# Patient Record
Sex: Female | Born: 1940 | Race: White | Hispanic: No | Smoking: Former smoker
Health system: Southern US, Community
[De-identification: ages and names within clinical notes are randomized; demographics above are authoritative.]

## PROBLEM LIST (undated history)

## (undated) DIAGNOSIS — K219 Gastro-esophageal reflux disease without esophagitis: Secondary | ICD-10-CM

## (undated) DIAGNOSIS — R011 Cardiac murmur, unspecified: Secondary | ICD-10-CM

## (undated) DIAGNOSIS — C9200 Acute myeloblastic leukemia, not having achieved remission: Secondary | ICD-10-CM

## (undated) DIAGNOSIS — M8580 Other specified disorders of bone density and structure, unspecified site: Secondary | ICD-10-CM

## (undated) DIAGNOSIS — B370 Candidal stomatitis: Secondary | ICD-10-CM

## (undated) HISTORY — DX: Cardiac murmur, unspecified: R01.1

## (undated) HISTORY — PX: BONE MARROW TRANSPLANT: SHX200

## (undated) HISTORY — PX: NECK SURGERY: SHX720

## (undated) HISTORY — PX: WISDOM TOOTH EXTRACTION: SHX21

## (undated) HISTORY — PX: BREAST LUMPECTOMY: SHX2

## (undated) HISTORY — DX: Acute myeloblastic leukemia, not having achieved remission: C92.00

## (undated) HISTORY — PX: EYE SURGERY: SHX253

## (undated) HISTORY — DX: Gastro-esophageal reflux disease without esophagitis: K21.9

## (undated) HISTORY — PX: CATARACT EXTRACTION: SUR2

## (undated) HISTORY — PX: MENISCUS REPAIR: SHX5179

## (undated) HISTORY — DX: Other specified disorders of bone density and structure, unspecified site: M85.80

## (undated) HISTORY — PX: HYSTEROTOMY: SHX1776

## (undated) HISTORY — PX: LYMPH GLAND EXCISION: SHX13

## (undated) HISTORY — PX: SALPINGECTOMY: SHX328

---

## 2017-10-31 LAB — LIPID PANEL
HDL: 43 (ref 35–70)
LDL Cholesterol: 47
Triglycerides: 231 — AB (ref 40–160)

## 2017-10-31 LAB — BASIC METABOLIC PANEL
BUN: 10 (ref 4–21)
Chloride: 108 (ref 99–108)
Creatinine: 0.7 (ref 0.5–1.1)
Glucose: 158
Potassium: 3.4 (ref 3.4–5.3)
Sodium: 143 (ref 137–147)

## 2017-10-31 LAB — COMPREHENSIVE METABOLIC PANEL
Albumin: 3.9 (ref 3.5–5.0)
Calcium: 9.1 (ref 8.7–10.7)

## 2017-10-31 LAB — HEPATIC FUNCTION PANEL
ALT: 13 (ref 7–35)
AST: 18 (ref 13–35)
Alkaline Phosphatase: 71 (ref 25–125)
Bilirubin, Total: 0.2

## 2018-05-20 LAB — HM COLONOSCOPY

## 2019-05-19 LAB — BASIC METABOLIC PANEL
CO2: 26 — AB (ref 13–22)
Chloride: 108 (ref 99–108)
Creatinine: 0.9 (ref 0.5–1.1)
Glucose: 100
Potassium: 4 (ref 3.4–5.3)
Sodium: 144 (ref 137–147)

## 2019-05-19 LAB — CBC AND DIFFERENTIAL
HCT: 38 (ref 36–46)
Hemoglobin: 12.2 (ref 12.0–16.0)
Platelets: 249 (ref 150–399)
WBC: 6.9

## 2019-05-19 LAB — COMPREHENSIVE METABOLIC PANEL
Albumin: 4.2 (ref 3.5–5.0)
Calcium: 9.7 (ref 8.7–10.7)
Globulin: 2.9

## 2019-05-19 LAB — HEPATIC FUNCTION PANEL
ALT: 9 (ref 7–35)
AST: 18 (ref 13–35)
Alkaline Phosphatase: 62 (ref 25–125)

## 2019-05-19 LAB — CBC: RBC: 5.07 (ref 3.87–5.11)

## 2019-05-21 LAB — IRON,TIBC AND FERRITIN PANEL
%SAT: 40
Ferritin: 106
Iron: 130
TIBC: 323

## 2019-06-14 LAB — HM MAMMOGRAPHY

## 2019-08-06 LAB — IRON,TIBC AND FERRITIN PANEL
%SAT: 33
Ferritin: 74
Iron: 108
TIBC: 324

## 2019-08-06 LAB — CBC AND DIFFERENTIAL
HCT: 37 (ref 36–46)
Hemoglobin: 11.6 — AB (ref 12.0–16.0)
Platelets: 241 (ref 150–399)
WBC: 7

## 2019-08-06 LAB — CBC: RBC: 4.79 (ref 3.87–5.11)

## 2019-08-06 LAB — BASIC METABOLIC PANEL
BUN: 12 (ref 4–21)
Creatinine: 0.8 (ref 0.5–1.1)
Glucose: 111

## 2019-08-06 LAB — HEMOGLOBIN A1C: Hemoglobin A1C: 5.8

## 2020-02-12 ENCOUNTER — Other Ambulatory Visit: Payer: Self-pay

## 2020-02-12 ENCOUNTER — Encounter: Payer: Self-pay | Admitting: Family Medicine

## 2020-02-12 ENCOUNTER — Ambulatory Visit (INDEPENDENT_AMBULATORY_CARE_PROVIDER_SITE_OTHER): Payer: Medicare HMO | Admitting: Family Medicine

## 2020-02-12 DIAGNOSIS — C9201 Acute myeloblastic leukemia, in remission: Secondary | ICD-10-CM | POA: Insufficient documentation

## 2020-02-12 DIAGNOSIS — Z9481 Bone marrow transplant status: Secondary | ICD-10-CM | POA: Insufficient documentation

## 2020-02-12 DIAGNOSIS — M8580 Other specified disorders of bone density and structure, unspecified site: Secondary | ICD-10-CM | POA: Diagnosis not present

## 2020-02-12 DIAGNOSIS — I059 Rheumatic mitral valve disease, unspecified: Secondary | ICD-10-CM

## 2020-02-12 DIAGNOSIS — K219 Gastro-esophageal reflux disease without esophagitis: Secondary | ICD-10-CM | POA: Insufficient documentation

## 2020-02-12 DIAGNOSIS — N182 Chronic kidney disease, stage 2 (mild): Secondary | ICD-10-CM

## 2020-02-12 DIAGNOSIS — R7303 Prediabetes: Secondary | ICD-10-CM | POA: Insufficient documentation

## 2020-02-12 DIAGNOSIS — I340 Nonrheumatic mitral (valve) insufficiency: Secondary | ICD-10-CM | POA: Insufficient documentation

## 2020-02-12 NOTE — Assessment & Plan Note (Signed)
Hgb A1c 5.8 on outside records. Will discuss at next visit and plan to follow annually. Encouraged healthy diet.

## 2020-02-12 NOTE — Assessment & Plan Note (Signed)
Reviewed outside records. Most recent BM biopsy was last year. Blood work has been stable and BM stable as well. Has been in remission. Referral to local heme/onc for continued following.

## 2020-02-12 NOTE — Assessment & Plan Note (Signed)
Taking boniva. Will get records

## 2020-02-12 NOTE — Progress Notes (Signed)
Subjective:     Roberta Curtis is a 79 y.o. female presenting for Establish Care     HPI  #Hx of AML s/p bone marrow transplant - was following with oncology and getting annual bone marrow biopsy - would like referral to oncology  Review of Systems   Social History   Tobacco Use  Smoking Status Former Smoker  . Years: 6.00  . Types: Cigarettes  . Start date: 1930  Smokeless Tobacco Never Used        Objective:    BP Readings from Last 3 Encounters:  02/12/20 122/68   Wt Readings from Last 3 Encounters:  02/12/20 134 lb (60.8 kg)    BP 122/68   Pulse 90   Temp 98.1 F (36.7 C) (Temporal)   Ht '5\' 3"'  (1.6 m)   Wt 134 lb (60.8 kg)   SpO2 98%   BMI 23.74 kg/m    Physical Exam Constitutional:      General: She is not in acute distress.    Appearance: She is well-developed. She is not diaphoretic.  HENT:     Right Ear: External ear normal.     Left Ear: External ear normal.  Eyes:     Conjunctiva/sclera: Conjunctivae normal.  Cardiovascular:     Rate and Rhythm: Normal rate and regular rhythm.     Heart sounds: No murmur.  Pulmonary:     Effort: Pulmonary effort is normal. No respiratory distress.     Breath sounds: Normal breath sounds. No wheezing.  Musculoskeletal:     Cervical back: Neck supple.  Skin:    General: Skin is warm and dry.     Capillary Refill: Capillary refill takes less than 2 seconds.  Neurological:     Mental Status: She is alert. Mental status is at baseline.  Psychiatric:        Mood and Affect: Mood normal.        Behavior: Behavior normal.           Assessment & Plan:   Problem List Items Addressed This Visit      Cardiovascular and Mediastinum   Mitral valve disorder    Pt unsure what functional issues she has, but notes recent echo. Will obtain outside echo as not in records she brought to the clinic today.         Digestive   GERD (gastroesophageal reflux disease)    Cont omeprazole      Relevant  Medications   omeprazole (PRILOSEC) 20 MG capsule     Musculoskeletal and Integument   Osteopenia    Taking boniva. Will get records        Genitourinary   CKD (chronic kidney disease) stage 2, GFR 60-89 ml/min     Other   AML (acute myeloid leukemia) in remission [facility])    Reviewed outside records. Most recent BM biopsy was last year. Blood work has been stable and BM stable as well. Has been in remission. Referral to local heme/onc for continued following.       Relevant Medications   prochlorperazine (COMPAZINE) 10 MG tablet   Other Relevant Orders   Ambulatory referral to Hematology / Oncology   S/P bone marrow transplant [facility])   Relevant Orders   Ambulatory referral to Hematology / Oncology   Prediabetes    Hgb A1c 5.8 on outside records. Will discuss at next visit and plan to follow annually. Encouraged healthy diet.         >45 minutes  spent including face to face with patient and reviewing patient provided outside records and documenation.    Return in about 6 months (around 08/13/2020).  Lesleigh Noe, MD

## 2020-02-12 NOTE — Assessment & Plan Note (Signed)
Cont omeprazole

## 2020-02-12 NOTE — Assessment & Plan Note (Signed)
Pt unsure what functional issues she has, but notes recent echo. Will obtain outside echo as not in records she brought to the clinic today.

## 2020-02-12 NOTE — Patient Instructions (Signed)
#  Referral I have placed a referral to a specialist for you. You should receive a phone call from the specialty office. Make sure your voicemail is not full and that if you are able to answer your phone to unknown or new numbers.   It may take up to 2 weeks to hear about the referral. If you do not hear anything in 2 weeks, please call our office and ask to speak with the referral coordinator.   

## 2020-02-13 ENCOUNTER — Other Ambulatory Visit: Payer: Self-pay

## 2020-02-13 MED ORDER — ALBUTEROL SULFATE HFA 108 (90 BASE) MCG/ACT IN AERS: 1.0000 | INHALATION_SPRAY | RESPIRATORY_TRACT | 1 refills | Status: DC | PRN

## 2020-02-13 NOTE — Telephone Encounter (Signed)
Patient request inhaler refill.

## 2020-02-14 ENCOUNTER — Encounter: Payer: Self-pay | Admitting: Family Medicine

## 2020-02-14 DIAGNOSIS — I519 Heart disease, unspecified: Secondary | ICD-10-CM | POA: Insufficient documentation

## 2020-02-21 ENCOUNTER — Encounter: Payer: Self-pay | Admitting: Oncology

## 2020-02-21 NOTE — Progress Notes (Signed)
Patient prescreened for appt. Pt moved to [place] from [place] in Feb 2021. Pt to establish care for Leukemia follow up. Pt was diagnosed with Leukemia in 2007 and had a BM transplant in 2007 as well (own donor). Pt was last seen by oncologist in [place] in Sep 2020. She reports getting bone marrow biopsy every year (last in September 2020). Pt to bring records.

## 2020-02-22 ENCOUNTER — Other Ambulatory Visit: Payer: Self-pay

## 2020-02-22 ENCOUNTER — Inpatient Hospital Stay: Payer: Medicare HMO | Attending: Oncology | Admitting: Oncology

## 2020-02-22 ENCOUNTER — Inpatient Hospital Stay: Payer: Medicare HMO

## 2020-02-22 VITALS — BP 118/59 | HR 67 | Temp 97.9°F | Resp 18 | Ht 63.0 in | Wt 132.1 lb

## 2020-02-22 DIAGNOSIS — Z79899 Other long term (current) drug therapy: Secondary | ICD-10-CM | POA: Insufficient documentation

## 2020-02-22 DIAGNOSIS — Z856 Personal history of leukemia: Secondary | ICD-10-CM

## 2020-02-22 DIAGNOSIS — R634 Abnormal weight loss: Secondary | ICD-10-CM | POA: Diagnosis not present

## 2020-02-22 DIAGNOSIS — Z148 Genetic carrier of other disease: Secondary | ICD-10-CM

## 2020-02-22 DIAGNOSIS — Z87891 Personal history of nicotine dependence: Secondary | ICD-10-CM | POA: Diagnosis not present

## 2020-02-22 DIAGNOSIS — R5382 Chronic fatigue, unspecified: Secondary | ICD-10-CM | POA: Diagnosis not present

## 2020-02-22 LAB — RETIC PANEL
Immature Retic Fract: 15.1 % (ref 2.3–15.9)
RBC.: 5.06 MIL/uL (ref 3.87–5.11)
Retic Count, Absolute: 81 10*3/uL (ref 19.0–186.0)
Retic Ct Pct: 1.6 % (ref 0.4–3.1)
Reticulocyte Hemoglobin: 26.5 pg — ABNORMAL LOW (ref >27.9)

## 2020-02-22 LAB — COMPREHENSIVE METABOLIC PANEL
ALT: 14 U/L (ref 0–44)
AST: 21 U/L (ref 15–41)
Albumin: 4.4 g/dL (ref 3.5–5.0)
Alkaline Phosphatase: 63 U/L (ref 38–126)
Anion gap: 8 (ref 5–15)
BUN: 12 mg/dL (ref 8–23)
CO2: 27 mmol/L (ref 22–32)
Calcium: 9.3 mg/dL (ref 8.9–10.3)
Chloride: 109 mmol/L (ref 98–111)
Creatinine, Ser: 0.87 mg/dL (ref 0.44–1.00)
GFR calc Af Amer: >60 mL/min (ref >60)
GFR calc non Af Amer: >60 mL/min (ref >60)
Glucose, Bld: 106 mg/dL — ABNORMAL HIGH (ref 70–99)
Potassium: 4.6 mmol/L (ref 3.5–5.1)
Sodium: 144 mmol/L (ref 135–145)
Total Bilirubin: 0.8 mg/dL (ref 0.3–1.2)
Total Protein: 7.6 g/dL (ref 6.5–8.1)

## 2020-02-22 LAB — TECHNOLOGIST SMEAR REVIEW: Plt Morphology: ADEQUATE

## 2020-02-22 LAB — IRON AND TIBC
Iron: 122 ug/dL (ref 28–170)
Saturation Ratios: 33 % — ABNORMAL HIGH (ref 10.4–31.8)
TIBC: 374 ug/dL (ref 250–450)
UIBC: 252 ug/dL

## 2020-02-22 LAB — FERRITIN: Ferritin: 54 ng/mL (ref 11–307)

## 2020-02-22 LAB — CBC WITH DIFFERENTIAL/PLATELET
Abs Immature Granulocytes: 0.06 10*3/uL (ref 0.00–0.07)
Basophils Absolute: 0 10*3/uL (ref 0.0–0.1)
Basophils Relative: 1 %
Eosinophils Absolute: 0.1 10*3/uL (ref 0.0–0.5)
Eosinophils Relative: 1 %
HCT: 37.8 % (ref 36.0–46.0)
Hemoglobin: 12.3 g/dL (ref 12.0–15.0)
Immature Granulocytes: 1 %
Lymphocytes Relative: 35 %
Lymphs Abs: 2.9 10*3/uL (ref 0.7–4.0)
MCH: 24.1 pg — ABNORMAL LOW (ref 26.0–34.0)
MCHC: 32.5 g/dL (ref 30.0–36.0)
MCV: 74.1 fL — ABNORMAL LOW (ref 80.0–100.0)
Monocytes Absolute: 0.6 10*3/uL (ref 0.1–1.0)
Monocytes Relative: 7 %
Neutro Abs: 4.5 10*3/uL (ref 1.7–7.7)
Neutrophils Relative %: 55 %
Platelets: 215 10*3/uL (ref 150–400)
RBC: 5.1 MIL/uL (ref 3.87–5.11)
RDW: 15.3 % (ref 11.5–15.5)
WBC: 8.2 10*3/uL (ref 4.0–10.5)
nRBC: 0 % (ref 0.0–0.2)

## 2020-02-22 LAB — LACTATE DEHYDROGENASE: LDH: 142 U/L (ref 98–192)

## 2020-02-22 LAB — ABO/RH: ABO/RH(D): O POS

## 2020-02-22 NOTE — Progress Notes (Signed)
Hematology/Oncology Consult note [facility] [phone] [phone]   Patient Care Team: Lesleigh Noe, MD as PCP - General (Family Medicine) [facility] (Oncology) [facility] (Cardiology) [facility]  REFERRING PROVIDER: Lesleigh Noe, MD  CHIEF COMPLAINTS/REASON FOR VISIT:  Establish care for history of AML status post bone marrow transplant, hemochromatosis carrier  HISTORY OF PRESENTING ILLNESS:   Roberta Curtis is a  79 y.o.  female with PMH listed below was seen in consultation at the request of  Lesleigh Noe, MD to establish care for  history of AML status post bone marrow transplant, hemochromatosis carrier. Patient moved from [place] to [place] in February 2021. She brought some of her oncology records. Extensive medical record review was performed by me Per note, patient has a history of acute myeloid leukemia, diagnosed in 2007, status post bone marrow transplant. Previously followed up with oncologist and has been having bone marrow biopsy surveillance every other years. 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  Detail of mutation is unknown. She reports that she has had phlebotomy in the past. Reports she has had right axillary lymph node biopsy and was told by her doctors not to have blood drawn on the right upper extremity due to risk of lymphedema.  Patient tells me that her husband passed away from [facility] many years ago. Patient reports feeling very well today.Denies  fever, chills, fatigue, night sweats.  She does not eat very well and this has been a chronic issue for her.  She has lost some weight recently.  Review of Systems  Constitutional: Negative for appetite change, chills, fatigue and fever.  HENT:   Negative for hearing loss and voice change.   Eyes: Negative for eye problems.  Respiratory: Negative for  chest tightness and cough.   Cardiovascular: Negative for chest pain.  Gastrointestinal: Negative for abdominal distention, abdominal pain and blood in stool.  Endocrine: Negative for hot flashes.  Genitourinary: Negative for difficulty urinating and frequency.   Musculoskeletal: Negative for arthralgias.  Skin: Negative for itching and rash.  Neurological: Negative for extremity weakness.  Hematological: Negative for adenopathy.  Psychiatric/Behavioral: Negative for confusion.    MEDICAL HISTORY:  Past Medical History:  Diagnosis Date  . Acute myeloblastic leukemia ([place])   . GERD (gastroesophageal reflux disease)   . Heart murmur   . Osteopenia     SURGICAL HISTORY: Past Surgical History:  Procedure Laterality Date  . BONE MARROW TRANSPLANT    . BREAST LUMPECTOMY Left   . CATARACT EXTRACTION    . CESAREAN SECTION    . HYSTEROTOMY    . LYMPH GLAND EXCISION    . MENISCUS REPAIR Right   . NECK SURGERY    . SALPINGECTOMY    . WISDOM TOOTH EXTRACTION      SOCIAL HISTORY: Social History   Socioeconomic History  . Marital status: Soil scientist    Spouse name: Alvis Lemmings  . Number of children: 3  . Years of education: high school  . Highest education level: Not on file  Occupational History  . Not on file  Tobacco Use  . Smoking status: Former Smoker    Packs/day: 1.00    Years: 6.00    Pack years: 6.00    Types: Cigarettes    Start date: 66    Quit date: 02/20/1970    Years since quitting: 50.0  . Smokeless tobacco: Never Used  Vaping  Use  . Vaping Use: Never used  Substance and Sexual Activity  . Alcohol use: Not Currently  . Drug use: Never  . Sexual activity: Not Currently    Partners: Male    Birth control/protection: Post-menopausal  Other Topics Concern  . 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 Difficulty of Paying Living Expenses:   Food Insecurity:   . Worried About Charity fundraiser in the Last Year:   . Arboriculturist in the Last Year:   Transportation Needs:   . Film/video editor (Medical):   [name] Lack of Transportation (Non-Medical):   Physical Activity:   . Days of Exercise per Week:   . Minutes of Exercise per Session:   Stress:   . Feeling of Stress :   Social Connections:   . Frequency of Communication with Friends and Family:   . Frequency of Social Gatherings with Friends and Family:   . Attends Religious Services:   . Active Member of Clubs or Organizations:   . Attends Archivist Meetings:   [name] Marital Status:   Intimate Partner Violence:   . Fear of Current or Ex-Partner:   . Emotionally Abused:   [name] Physically Abused:   . Sexually Abused:     FAMILY HISTORY: Family History  Problem Relation Age of Onset  . Retinitis pigmentosa Mother   . Stroke Mother   . Liver cancer Father   . Heart disease Father   . Heart disease Brother   . Blindness Brother     ALLERGIES:  is allergic to jadenu [deferasirox], levofloxacin, ondansetron hcl, codeine, oxycodone-acetaminophen, and penicillins.  MEDICATIONS:  Current Outpatient Medications  Medication Sig Dispense Refill  . albuterol (VENTOLIN HFA) 108 (90 Base) MCG/ACT inhaler Inhale 1-2 puffs into the lungs as needed for wheezing or shortness of breath. 6.7  g 1  . BIOTIN PO Take 1,000 mcg by mouth daily.     [name] ibandronate (BONIVA) 150 MG tablet Take 150 mg by mouth every 30 (thirty) days.     [name] omeprazole (PRILOSEC) 20 MG capsule Take by mouth as needed.    [name] POTASSIUM PO Take 99 mg by mouth daily.     . prochlorperazine (COMPAZINE) 10 MG tablet Take by mouth.    [name] VITAMIN D PO Take 50,000 mcg by mouth daily.      No current facility-administered medications for this visit.     PHYSICAL EXAMINATION: ECOG PERFORMANCE STATUS: 0 - Asymptomatic Vitals:   02/22/20 1128  BP: (!) 118/59  Pulse: 67  Resp: 18  Temp: 97.9 F (36.6  C)   Filed Weights   02/22/20 1128  Weight: 132 lb 1.6 oz (59.9 kg)    Physical Exam Constitutional:      General: She is not in acute distress. HENT:     Head: Normocephalic and atraumatic.  Eyes:     General: No scleral icterus. Cardiovascular:     Rate and Rhythm: Normal rate and regular rhythm.     Heart sounds: Normal heart sounds.  Pulmonary:     Effort: Pulmonary effort is normal. No respiratory distress.     Breath sounds: No wheezing.  Abdominal:     General: Bowel sounds are normal. There is no distension.     Palpations: Abdomen is soft.  Musculoskeletal:        General: No deformity. Normal range of motion.     Cervical back: Normal range of motion and neck supple.  Skin:    General: Skin is warm and dry.     Findings: No erythema or rash.  Neurological:     Mental Status: She is alert and oriented to person, place, and time. Mental status is at baseline.     Cranial Nerves: No cranial nerve deficit.     Coordination: Coordination normal.  Psychiatric:        Mood and Affect: Mood normal.     LABORATORY DATA:  I have reviewed the data as listed Lab Results  Component Value Date   WBC 8.2 02/22/2020   HGB 12.3 02/22/2020   HCT 37.8 02/22/2020   MCV 74.1 (L) 02/22/2020   PLT 215 02/22/2020   Recent Labs    05/19/19 0000 08/06/19 0000 02/22/20 1212  NA 144  --  144  K  4.0  --  4.6  CL 108  --  109  CO2 26*  --  27  GLUCOSE  --   --  106*  BUN  --  12 12  CREATININE 0.9 0.8 0.87  CALCIUM 9.7  --  9.3  GFRNONAA  --   --  >60  GFRAA  --   --  >60  PROT  --   --  7.6  ALBUMIN 4.2  --  4.4  AST 18  --  21  ALT 9  --  14  ALKPHOS 62  --  63  BILITOT  --   --  0.8   Iron/TIBC/Ferritin/ %Sat    Component Value Date/Time   IRON 122 02/22/2020 1212   IRON 108 08/06/2019 0000   TIBC 374 02/22/2020 1212   TIBC 324 08/06/2019 0000   FERRITIN 54 02/22/2020 1212   FERRITIN 74 08/06/2019 0000   IRONPCTSAT 33 (H) 02/22/2020 1212   IRONPCTSAT 33 08/06/2019 0000      RADIOGRAPHIC STUDIES: I have personally reviewed the radiological images as listed and agreed with the findings in the report. No results found.    ASSESSMENT & PLAN:  1. History of leukemia   2. Hemochromatosis carrier    #History of AML, status post bone marrow transplant. Patient has been in remission since then. Recommend to check CBC, CMP, LDH, flow cytometry, smear. , patient request to have blood type checked.  #Hemochromatosis carrier, Medical records do not include hemochromatosis mutation information I will obtain hemochromatosis mutation PCR. Check iron, TIBC, ferritin.  -Labs reviewed. Ferritin is 54, iron saturation 33, TIBC 374.  Hemochromatosis DNA PCR screening showed a single mutation of H63D.  Patient has heterozygous hemochromatosis mutation.  Majority of the heterozygous patient do not develop iron  overload.  I will hold off phlebotomy at this point. Flow cytometry is still pending.   Orders Placed This Encounter  Procedures  . Comprehensive metabolic panel    Standing Status:   Future    Number of Occurrences:   1    Standing Expiration Date:   02/21/2021  . CBC with Differential/Platelet    Standing Status:   Future    Number of Occurrences:   1    Standing Expiration Date:   02/21/2021  . Ferritin    Standing Status:   Future    Number of Occurrences:    1    Standing Expiration Date:   08/23/2020  . Iron and TIBC    Standing Status:   Future    Number of Occurrences:   1    Standing Expiration Date:   02/21/2021  . Retic Panel    Standing Status:   Future    Number of Occurrences:   1    Standing Expiration Date:   02/21/2021  . Technologist smear review    Standing Status:   Future    Number of Occurrences:   1    Standing Expiration Date:   02/21/2021  . Flow cytometry panel-leukemia/lymphoma work-up    Standing Status:   Future    Number of Occurrences:   1    Standing Expiration Date:   02/21/2021  . Lactate dehydrogenase    Standing Status:   Future    Number of Occurrences:   1    Standing Expiration Date:   02/21/2021  . Hemochromatosis DNA-PCR(c282y,h63d)    Standing Status:   Future    Number of Occurrences:   1    Standing Expiration Date:   02/21/2021  . ABO/Rh    Standing Status:   Future    Number of Occurrences:   1    Standing Expiration Date:   02/21/2021    All questions were answered. The patient knows to call the clinic with any problems questions or concerns.  cc Lesleigh Noe, MD    Return of visit: To be determined Thank you for this kind referral and the opportunity to participate in the care of this patient. A copy of today's note is routed to referring provider    Earlie Server, MD, PhD Hematology Oncology [facility] Pager- [phone] 02/22/2020

## 2020-02-23 ENCOUNTER — Telehealth: Payer: Self-pay

## 2020-02-23 NOTE — Telephone Encounter (Signed)
Release of info faxed to [facility] in [place]. Requesting First bone marrow biopsy results and bone marrow transplant notes. Fax confirmation received.  Ph: [phone] Fx: [phone]

## 2020-02-26 LAB — HEMOCHROMATOSIS DNA-PCR(C282Y,H63D)

## 2020-02-29 NOTE — Telephone Encounter (Signed)
Received records from [facility] via mail, but requested records were not in packet. I contacted pt and she states that she actually had her first BMBx and bone transplant at [facility] in [place]. She thought that Jonetta Speak would fax [place] those records as well. Request for records sent to [facility]. Fax confimation received.   ph: [phone] fax: [phone]

## 2020-03-01 LAB — COMP PANEL: LEUKEMIA/LYMPHOMA

## 2020-03-04 ENCOUNTER — Other Ambulatory Visit: Payer: Self-pay | Admitting: Oncology

## 2020-03-04 ENCOUNTER — Telehealth: Payer: Self-pay | Admitting: Oncology

## 2020-03-04 ENCOUNTER — Telehealth: Payer: Self-pay

## 2020-03-04 DIAGNOSIS — Z148 Genetic carrier of other disease: Secondary | ICD-10-CM

## 2020-03-04 DIAGNOSIS — Z856 Personal history of leukemia: Secondary | ICD-10-CM

## 2020-03-04 NOTE — Telephone Encounter (Signed)
-----   Message from Earlie Server, MD sent at 03/04/2020  3:37 PM EDT ----- Please arrange patient to have labs done in 6 months and follow-up with me 1 week after that.  Thank you very much.

## 2020-03-04 NOTE — Telephone Encounter (Signed)
Done..  Appts has been sched as requested. Pt is aware.

## 2020-03-04 NOTE — Telephone Encounter (Signed)
I called patient and communicated with her about her lab results over the phone. I recommend patient to follow-up in 6 months.  Patient appreciates the explanation.

## 2020-03-04 NOTE — Telephone Encounter (Signed)
Please schedule and notify pt of appt details.

## 2020-03-14 NOTE — Telephone Encounter (Signed)
Records received and are scanned under media

## 2020-05-06 ENCOUNTER — Telehealth: Payer: Self-pay

## 2020-05-06 NOTE — Telephone Encounter (Signed)
Roberta Curtis - Client TELEPHONE ADVICE RECORD [facility] Patient Name: Roberta Curtis Gender: Female DOB: Feb 12, 1941 Age: 79 Y 2 M Return Phone Number: [phone] (Primary) [place] [address] Client Roberta Curtis - Client Client Site [place] Physician Waunita Schooner- MD Contact Type Call Who Is Calling Patient / Member / Family / Caregiver Call Type Triage / Clinical Caller Name Roberta Curtis Relationship To Patient Self Return Phone Number [phone] (Primary) Chief Complaint Muscle Jerks, Tics And Shudders Reason for Call Symptomatic / Request for Health Information Initial Comment Caller states they're having extreme back pain, right by her waist- side by side. Sitting and laying dowm makes it worse. Having spasms. Translation No Nurse Assessment Nurse: Claiborne Billings, RN, Kim Date/Time (Eastern Time): 05/06/2020 9:41:02 AM Confirm and document reason for call. If symptomatic, describe symptoms. ---Caller states she has extreme pain across her lower back from left side of waist to right side, states feels like muscle spasms. States pain began 3 days ago, not able to lay down or sit down. Current pain level is 10/10. Has the patient had close contact with a person known or suspected to have the novel coronavirus illness OR traveled / lives in area with major community spread (including international travel) in the last 14 days from the onset of symptoms? * If Asymptomatic, screen for exposure and travel within the last 14 days. ---No Does the patient have any new or worsening symptoms? ---Yes Will a triage be completed? ---Yes Related visit to physician within the last 2 weeks? ---No Does the PT have any chronic conditions? (i.e. diabetes, asthma, this includes High risk factors for pregnancy, etc.) ---Yes List chronic conditions. ---Remission x 14 yrs from  AML Is this a behavioral health or substance abuse call? ---No Guidelines Guideline Title Affirmed Question Affirmed Notes Nurse Date/Time (Eastern Time) Muscle Aches and Body Pain Diabetes mellitus or weak immune system (e.g., HIV positive, cancer chemo, splenectomy, Claiborne Billings, RN, Kim 05/06/2020 9:43:29 AM PLEASE NOTE: All timestamps contained within this report are represented as [place] Standard Time. CONFIDENTIALTY NOTICE: This fax transmission is intended only for the addressee. It contains information that is legally privileged, confidential or otherwise protected from use or disclosure. If you are not the intended recipient, you are strictly prohibited from reviewing, disclosing, copying using or disseminating any of this information or taking any action in reliance on or regarding this information. If you have received this fax in error, please notify us immediately by telephone so that we can arrange for its return to [place]. Phone: [phone], Toll-Free: [phone], Fax: [phone] Page: 2 of 2 Call Id: [id number] Guidelines Guideline Title Affirmed Question Affirmed Notes Nurse Date/Time Eilene Ghazi Time) organ transplant, chronic steroids) Disp. Time Eilene Ghazi Time) Disposition Final User 05/06/2020 9:46:11 AM Call PCP within 24 Hours Yes Claiborne Billings, RN, Max Sane Disagree/Comply Comply Caller Understands Yes PreDisposition Did not know what to do Care Advice Given Per Guideline CALL PCP WITHIN 24 HOURS: * You need to discuss this with your doctor (or NP/PA) within the next 24 hours. * IF OFFICE WILL BE OPEN: Call the office when it opens tomorrow morning. CALL BACK IF: * Fever occurs * You become worse. CARE ADVICE given per Muscle Aches and Body Pain (Adult) guideline. Comments User: Baruch Goldmann Date/Time Eilene Ghazi Time): 05/06/2020 9:30:30 AM Transferred from the office.

## 2020-05-06 NOTE — Telephone Encounter (Signed)
Per appt notes pt already has appt with Dr Einar Pheasant on 05/07/20 at 12:20; per that note pt declined UC or ED. FYI to Dr Einar Pheasant.

## 2020-05-07 ENCOUNTER — Encounter: Payer: Self-pay | Admitting: Family Medicine

## 2020-05-07 ENCOUNTER — Ambulatory Visit (INDEPENDENT_AMBULATORY_CARE_PROVIDER_SITE_OTHER): Payer: Medicare HMO | Admitting: Family Medicine

## 2020-05-07 ENCOUNTER — Other Ambulatory Visit: Payer: Self-pay

## 2020-05-07 VITALS — BP 110/72 | HR 68 | Temp 98.5°F | Ht 63.0 in | Wt 142.2 lb

## 2020-05-07 DIAGNOSIS — G8929 Other chronic pain: Secondary | ICD-10-CM | POA: Diagnosis not present

## 2020-05-07 DIAGNOSIS — M545 Low back pain, unspecified: Secondary | ICD-10-CM | POA: Insufficient documentation

## 2020-05-07 MED ORDER — CYCLOBENZAPRINE HCL 5 MG PO TABS: 5.0000 mg | ORAL_TABLET | Freq: Three times a day (TID) | ORAL | 0 refills | Status: DC | PRN

## 2020-05-07 NOTE — Patient Instructions (Addendum)
Back Strain - continue taking Advil 600-800 mg 2-3 times daily - can use muscle relaxant medication at night to help with spasms - continue exercises  If you don't notice improvement in 2 weeks to call and I can refer to physical.

## 2020-05-07 NOTE — Progress Notes (Signed)
Subjective:     Roberta Curtis is a 79 y.o. female presenting for Back Pain     Back Pain This is a recurrent problem. The current episode started in the past 7 days. The problem occurs 2 to 4 times per day. The pain is present in the lumbar spine. Quality: spasm. The pain does not radiate. The symptoms are aggravated by sitting and lying down. Stiffness is present at night. Pertinent negatives include no bladder incontinence, bowel incontinence, fever, numbness, tingling or weakness. She has tried NSAIDs, ice and home exercises for the symptoms. The treatment provided mild relief.   Improves with leaning back  Wearing back brace   Review of Systems  Constitutional: Negative for fever.  Gastrointestinal: Negative for bowel incontinence.  Genitourinary: Negative for bladder incontinence.  Musculoskeletal: Positive for back pain.  Neurological: Negative for tingling, weakness and numbness.     Social History   Tobacco Use  Smoking Status Former Smoker  . Packs/day: 1.00  . Years: 6.00  . Pack years: 6.00  . Types: Cigarettes  . Start date: 37  . Quit date: 02/20/1970  . Years since quitting: 50.2  Smokeless Tobacco Never Used        Objective:    BP Readings from Last 3 Encounters:  05/07/20 110/72  02/22/20 (!) 118/59  02/12/20 122/68   Wt Readings from Last 3 Encounters:  05/07/20 142 lb 4 oz (64.5 kg)  02/22/20 132 lb 1.6 oz (59.9 kg)  02/12/20 134 lb (60.8 kg)    BP 110/72   Pulse 68   Temp 98.5 F (36.9 C) (Temporal)   Ht 5\' 3"  (1.6 m)   Wt 142 lb 4 oz (64.5 kg) Comment: with back brace  SpO2 97%   BMI 25.20 kg/m    Physical Exam Constitutional:      General: She is not in acute distress.    Appearance: She is well-developed. She is not diaphoretic.  HENT:     Right Ear: External ear normal.     Left Ear: External ear normal.  Eyes:     Conjunctiva/sclera: Conjunctivae normal.  Cardiovascular:     Rate and Rhythm: Normal rate.    Pulmonary:     Effort: Pulmonary effort is normal.  Musculoskeletal:     Cervical back: Neck supple.     Comments: Back: Inspection: no erythema, no swelling Palpation: no spinous process tenderness, right paraspinous TTP ROM: decreased flexion, normal rotation/lateral flexion w/ pain, normal extension Strength: normal Straight leg raise - negative  Skin:    General: Skin is warm and dry.     Capillary Refill: Capillary refill takes less than 2 seconds.  Neurological:     Mental Status: She is alert. Mental status is at baseline.  Psychiatric:        Mood and Affect: Mood normal.        Behavior: Behavior normal.           Assessment & Plan:   Problem List Items Addressed This Visit      Other   Chronic bilateral low back pain without sciatica - Primary    Back strain recurrent for patient. Advised NSAID and can use muscle relaxant if severe but keep to nighttime due to sleepiness. Call if not improved in 2 weeks and would recommend PT.       Relevant Medications   cyclobenzaprine (FLEXERIL) 5 MG tablet       Return if symptoms worsen or fail to improve.  Lesleigh Noe, MD  This visit occurred during the SARS-CoV-2 public health emergency.  Safety protocols were in place, including screening questions prior to the visit, additional usage of staff PPE, and extensive cleaning of exam room while observing appropriate contact time as indicated for disinfecting solutions.

## 2020-05-07 NOTE — Assessment & Plan Note (Signed)
Back strain recurrent for patient. Advised NSAID and can use muscle relaxant if severe but keep to nighttime due to sleepiness. Call if not improved in 2 weeks and would recommend PT.

## 2020-05-08 NOTE — Telephone Encounter (Signed)
See encounter

## 2020-05-14 ENCOUNTER — Telehealth: Payer: Self-pay

## 2020-05-14 DIAGNOSIS — G8929 Other chronic pain: Secondary | ICD-10-CM

## 2020-05-14 NOTE — Telephone Encounter (Signed)
Per our discussion last week, I would recommend physical therapy since symptoms are not improving.   Referral placed.   Can try heat/ice. Continue NSAIDs (ibuprofen/aleve).

## 2020-05-14 NOTE — Telephone Encounter (Signed)
Spoke to pt and relayed information, per Dr. Einar Pheasant. Pt states she looks forward to their call.

## 2020-05-14 NOTE — Telephone Encounter (Signed)
Pt is not feeling any better. Pain is  From her neck down and her waist across. Feels lightheaded today. Asking what she should do. Took the muscle relaxers. Please advise [phone].

## 2020-07-25 ENCOUNTER — Telehealth: Payer: Self-pay | Admitting: Family Medicine

## 2020-07-25 NOTE — Telephone Encounter (Signed)
It is not necessary for her to schedule it prior to our visit.

## 2020-07-25 NOTE — Telephone Encounter (Signed)
Patient called in wanting to know if she needs to get her mammogram and bone density before she sees Dr Einar Pheasant. Please call the patient back at [phone]. EM

## 2020-07-25 NOTE — Telephone Encounter (Signed)
Please advise 

## 2020-07-26 NOTE — Telephone Encounter (Signed)
Spoke to pt and relayed the info per Dr. Einar Pheasant

## 2020-08-13 ENCOUNTER — Ambulatory Visit: Payer: Self-pay | Admitting: Family Medicine

## 2020-08-13 ENCOUNTER — Ambulatory Visit (INDEPENDENT_AMBULATORY_CARE_PROVIDER_SITE_OTHER): Payer: Medicare HMO | Admitting: Family Medicine

## 2020-08-13 ENCOUNTER — Encounter: Payer: Self-pay | Admitting: Family Medicine

## 2020-08-13 ENCOUNTER — Telehealth: Payer: Self-pay | Admitting: Family Medicine

## 2020-08-13 ENCOUNTER — Other Ambulatory Visit: Payer: Self-pay

## 2020-08-13 VITALS — BP 118/62 | HR 67 | Temp 96.0°F | Ht 63.0 in | Wt 137.5 lb

## 2020-08-13 DIAGNOSIS — M545 Low back pain, unspecified: Secondary | ICD-10-CM

## 2020-08-13 DIAGNOSIS — K219 Gastro-esophageal reflux disease without esophagitis: Secondary | ICD-10-CM | POA: Diagnosis not present

## 2020-08-13 DIAGNOSIS — G8929 Other chronic pain: Secondary | ICD-10-CM

## 2020-08-13 DIAGNOSIS — M8580 Other specified disorders of bone density and structure, unspecified site: Secondary | ICD-10-CM

## 2020-08-13 DIAGNOSIS — R110 Nausea: Secondary | ICD-10-CM | POA: Diagnosis not present

## 2020-08-13 MED ORDER — OMEPRAZOLE 20 MG PO CPDR: 20.0000 mg | DELAYED_RELEASE_CAPSULE | ORAL | 1 refills | Status: DC | PRN

## 2020-08-13 MED ORDER — PROCHLORPERAZINE MALEATE 10 MG PO TABS: 10.0000 mg | ORAL_TABLET | Freq: Every day | ORAL | 1 refills | Status: DC

## 2020-08-13 MED ORDER — FAMOTIDINE 20 MG PO TABS: 20.0000 mg | ORAL_TABLET | Freq: Two times a day (BID) | ORAL | 1 refills | Status: DC

## 2020-08-13 MED ORDER — CYCLOBENZAPRINE HCL 5 MG PO TABS: 5.0000 mg | ORAL_TABLET | Freq: Three times a day (TID) | ORAL | 0 refills | Status: DC | PRN

## 2020-08-13 MED ORDER — IBANDRONATE SODIUM 150 MG PO TABS: 150.0000 mg | ORAL_TABLET | ORAL | 1 refills | Status: DC
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
## 2020-08-13 NOTE — Assessment & Plan Note (Signed)
Suspect her abdominal discomfort is related to reflux. Trial of H2 blocker.

## 2020-08-13 NOTE — Assessment & Plan Note (Signed)
On boniva. Due for repeat dexa. Will order

## 2020-08-13 NOTE — Assessment & Plan Note (Signed)
Encouraged trying flexeril and refill provided. Already in PT w/ limited improvement. Referral to ortho to evaluate and see if she has additional options.

## 2020-08-13 NOTE — Telephone Encounter (Signed)
Pt called in wanted to let Dr.Cody know she got her booster on 06/12/20 and it was [employer]

## 2020-08-13 NOTE — Patient Instructions (Addendum)
Stomach pain - try Famotidine 20 mg twice daily for 2 weeks - if no improvement, can stop - if helping - continue for 6 weeks  Back pain - referral to orthopedic - try the muscle relaxant  Please call the location of your choice from the menu below to schedule your Mammogram and/or Bone Density appointment.    [place]  [address] [place] [facility]   Phone:  [place], [place] [address]                                            Services: 3D Mammogram and Bone Density  Lab Results  Component Value Date   WBC 8.2 02/22/2020   HGB 12.3 02/22/2020   HCT 37.8 02/22/2020   MCV 74.1 (L) 02/22/2020   PLT 215 02/22/2020   CMP Latest Ref Rng & Units 02/22/2020 08/06/2019 05/19/2019  Glucose 70 - 99 mg/dL 106(H) - -  BUN 8 - 23 mg/dL 12 12 -  Creatinine 0.44 - 1.00 mg/dL 0.87 0.8 0.9  Sodium 135 - 145 mmol/L 144 - 144  Potassium 3.5 - 5.1 mmol/L 4.6 - 4.0  Chloride 98 - 111 mmol/L 109 - 108  CO2 22 - 32 mmol/L 27 - 26(A)  Calcium 8.9 - 10.3 mg/dL 9.3 - 9.7  Total Protein 6.5 - 8.1 g/dL 7.6 - -  Total Bilirubin 0.3 - 1.2 mg/dL 0.8 - -  Alkaline Phos 38 - 126 U/L 63 - 62  AST 15 - 41 U/L 21 - 18  ALT 0 - 44 U/L 14 - 9

## 2020-08-13 NOTE — Assessment & Plan Note (Signed)
Encouraged trial of famotidine to see if this will improve her symptoms and help with the RUQ abdominal pain she having.

## 2020-09-03 ENCOUNTER — Other Ambulatory Visit: Payer: Medicare HMO

## 2020-09-04 ENCOUNTER — Other Ambulatory Visit: Payer: Self-pay

## 2020-09-04 ENCOUNTER — Inpatient Hospital Stay: Payer: Medicare HMO | Attending: Oncology

## 2020-09-04 DIAGNOSIS — Z856 Personal history of leukemia: Secondary | ICD-10-CM | POA: Insufficient documentation

## 2020-09-04 DIAGNOSIS — Z87891 Personal history of nicotine dependence: Secondary | ICD-10-CM | POA: Insufficient documentation

## 2020-09-04 DIAGNOSIS — Z148 Genetic carrier of other disease: Secondary | ICD-10-CM | POA: Diagnosis not present

## 2020-09-04 DIAGNOSIS — Z9481 Bone marrow transplant status: Secondary | ICD-10-CM | POA: Insufficient documentation

## 2020-09-04 DIAGNOSIS — N189 Chronic kidney disease, unspecified: Secondary | ICD-10-CM | POA: Insufficient documentation

## 2020-09-04 LAB — COMPREHENSIVE METABOLIC PANEL
ALT: 15 U/L (ref 0–44)
AST: 21 U/L (ref 15–41)
Albumin: 4 g/dL (ref 3.5–5.0)
Alkaline Phosphatase: 58 U/L (ref 38–126)
Anion gap: 8 (ref 5–15)
BUN: 17 mg/dL (ref 8–23)
CO2: 24 mmol/L (ref 22–32)
Calcium: 9.1 mg/dL (ref 8.9–10.3)
Chloride: 108 mmol/L (ref 98–111)
Creatinine, Ser: 0.73 mg/dL (ref 0.44–1.00)
GFR, Estimated: >60 mL/min (ref >60)
Glucose, Bld: 113 mg/dL — ABNORMAL HIGH (ref 70–99)
Potassium: 3.8 mmol/L (ref 3.5–5.1)
Sodium: 140 mmol/L (ref 135–145)
Total Bilirubin: 0.4 mg/dL (ref 0.3–1.2)
Total Protein: 7.1 g/dL (ref 6.5–8.1)

## 2020-09-04 LAB — CBC WITH DIFFERENTIAL/PLATELET
Abs Immature Granulocytes: 0.02 10*3/uL (ref 0.00–0.07)
Basophils Absolute: 0.1 10*3/uL (ref 0.0–0.1)
Basophils Relative: 1 %
Eosinophils Absolute: 0.1 10*3/uL (ref 0.0–0.5)
Eosinophils Relative: 1 %
HCT: 35 % — ABNORMAL LOW (ref 36.0–46.0)
Hemoglobin: 11.4 g/dL — ABNORMAL LOW (ref 12.0–15.0)
Immature Granulocytes: 0 %
Lymphocytes Relative: 33 %
Lymphs Abs: 2.6 10*3/uL (ref 0.7–4.0)
MCH: 24 pg — ABNORMAL LOW (ref 26.0–34.0)
MCHC: 32.6 g/dL (ref 30.0–36.0)
MCV: 73.7 fL — ABNORMAL LOW (ref 80.0–100.0)
Monocytes Absolute: 0.5 10*3/uL (ref 0.1–1.0)
Monocytes Relative: 6 %
Neutro Abs: 4.6 10*3/uL (ref 1.7–7.7)
Neutrophils Relative %: 59 %
Platelets: 218 10*3/uL (ref 150–400)
RBC: 4.75 MIL/uL (ref 3.87–5.11)
RDW: 15.4 % (ref 11.5–15.5)
WBC: 7.8 10*3/uL (ref 4.0–10.5)
nRBC: 0 % (ref 0.0–0.2)

## 2020-09-04 LAB — IRON AND TIBC
Iron: 125 ug/dL (ref 28–170)
Saturation Ratios: 34 % — ABNORMAL HIGH (ref 10.4–31.8)
TIBC: 364 ug/dL (ref 250–450)
UIBC: 239 ug/dL

## 2020-09-04 LAB — FERRITIN: Ferritin: 52 ng/mL (ref 11–307)

## 2020-09-05 ENCOUNTER — Ambulatory Visit (INDEPENDENT_AMBULATORY_CARE_PROVIDER_SITE_OTHER): Payer: Medicare HMO | Admitting: Family Medicine

## 2020-09-05 ENCOUNTER — Other Ambulatory Visit: Payer: Self-pay

## 2020-09-05 ENCOUNTER — Encounter: Payer: Self-pay | Admitting: Family Medicine

## 2020-09-05 ENCOUNTER — Telehealth: Payer: Self-pay

## 2020-09-05 VITALS — BP 158/80 | HR 105 | Temp 98.3°F | Ht 63.0 in | Wt 140.1 lb

## 2020-09-05 DIAGNOSIS — R1011 Right upper quadrant pain: Secondary | ICD-10-CM | POA: Diagnosis not present

## 2020-09-05 DIAGNOSIS — R110 Nausea: Secondary | ICD-10-CM

## 2020-09-05 DIAGNOSIS — G8929 Other chronic pain: Secondary | ICD-10-CM

## 2020-09-05 DIAGNOSIS — M545 Low back pain, unspecified: Secondary | ICD-10-CM | POA: Diagnosis not present

## 2020-09-05 DIAGNOSIS — R32 Unspecified urinary incontinence: Secondary | ICD-10-CM | POA: Diagnosis not present

## 2020-09-05 DIAGNOSIS — N3941 Urge incontinence: Secondary | ICD-10-CM | POA: Insufficient documentation

## 2020-09-05 DIAGNOSIS — T23001A Burn of unspecified degree of right hand, unspecified site, initial encounter: Secondary | ICD-10-CM

## 2020-09-05 DIAGNOSIS — X150XXA Contact with hot stove (kitchen), initial encounter: Secondary | ICD-10-CM

## 2020-09-05 DIAGNOSIS — T300 Burn of unspecified body region, unspecified degree: Secondary | ICD-10-CM | POA: Insufficient documentation

## 2020-09-05 MED ORDER — CYCLOBENZAPRINE HCL 10 MG PO TABS: 10.0000 mg | ORAL_TABLET | Freq: Three times a day (TID) | ORAL | 1 refills | Status: DC | PRN

## 2020-09-05 MED ORDER — FAMOTIDINE 20 MG PO TABS: 20.0000 mg | ORAL_TABLET | Freq: Two times a day (BID) | ORAL | 1 refills | Status: DC
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
## 2020-09-05 NOTE — Assessment & Plan Note (Signed)
Worsening symptoms. Following with ortho. Has MRI but has not gotten the result. Increase the flexeril 5 > 10 mg prn. Cont [facility] care.

## 2020-09-05 NOTE — Assessment & Plan Note (Signed)
Unclear if related directly to back pain or more remotely with urge followed by mobility limitation. Significant worsening for patient. She will advise ortho of the change and referral to urology for support.
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
## 2020-09-05 NOTE — Telephone Encounter (Signed)
I spoke with pt and the rt hand is swollen and warm to touch and blistered area has scabbed over and no drainage but pt concerned about swelling. Dr Selena Batten said she would see pt today at 4:20.pm. pt very appreciative and pt should be at office today at 4:05 to get cked in. No covid symptoms. FYI to Dr Selena Batten.

## 2020-09-05 NOTE — Assessment & Plan Note (Signed)
Recent blood work with normal liver and kidney function and normal WBC. Will get [place] to r/o gallbladder given location. Cont GERD treatment as famotidine helping. Wonder if this could be MSK given her back pain with possible splinting. Advised stool softener as she notes some difficulty passing bm.

## 2020-09-05 NOTE — Patient Instructions (Addendum)
Back Pain - try higher dose of flexeril to see if that helps  Urine symptoms - mention to ortho when you ask about your MRI - referral to urology  Abdominal pain - Ultrasound ordered to evaluate  Burn  - continue to watch and wait - if worsening redness or pain -- consider urgent care or ER or call next week   Lets plan to check back in 6 weeks or sooner if needed   #Referral I have placed a referral to a specialist for you. You should receive a phone call from the specialty office. Make sure your voicemail is not full and that if you are able to answer your phone to unknown or new numbers.   It may take up to 2 weeks to hear about the referral. If you do not hear anything in 2 weeks, please call our office and ask to speak with the referral coordinator.
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
## 2020-09-09 LAB — COMP PANEL: LEUKEMIA/LYMPHOMA

## 2020-09-09 NOTE — Telephone Encounter (Signed)
See note from 12/30

## 2020-09-10 ENCOUNTER — Ambulatory Visit: Payer: Medicare HMO | Admitting: Oncology

## 2020-09-10 ENCOUNTER — Telehealth: Payer: Self-pay | Admitting: Oncology

## 2020-09-10 NOTE — Telephone Encounter (Addendum)
Will you call patient to schedule a virtual visit to discuss lab results with MD?

## 2020-09-11 ENCOUNTER — Inpatient Hospital Stay: Payer: Medicare HMO | Admitting: Oncology

## 2020-09-11 NOTE — Telephone Encounter (Signed)
Contacted pt and notified her that labs are stable. Pt does not want to wait 3 months to see Dr. Cathie Hoops. Appointment rescheduled to 09/30/20 @ 2:45p (she wanted a few weeks to recover from pain.)

## 2020-09-11 NOTE — Telephone Encounter (Signed)
Per [name]: Pt stated that she's not able to do a Virtual visit....Roberta KitchenMarland KitchenPt is requesting a call back in reference to her lab results.. pt can be reached @ [phone]. Please advise

## 2020-09-11 NOTE — Telephone Encounter (Signed)
Let her know that her labs are stable. If she can not do virtual visit and not able to come for in person visit, ok to reschedule in 3 months.

## 2020-09-16 ENCOUNTER — Other Ambulatory Visit: Payer: Self-pay

## 2020-09-16 ENCOUNTER — Ambulatory Visit
Admission: RE | Admit: 2020-09-16 | Discharge: 2020-09-16 | Disposition: A | Discharge status: Home / Self Care | Payer: Medicare HMO | Source: Ambulatory Visit | Attending: Family Medicine | Admitting: Family Medicine

## 2020-09-16 DIAGNOSIS — R1011 Right upper quadrant pain: Secondary | ICD-10-CM | POA: Insufficient documentation

## 2020-09-16 IMAGING — US US ABDOMEN LIMITED
1 series · 14 of 25 positions shown · non-contrast
Comparison: None.

CLINICAL DATA: ruq abdominal pain

EXAM:
ULTRASOUND ABDOMEN LIMITED RIGHT UPPER QUADRANT

[Series 1: us abdomen limited ruq (liver/gb) · 14 of 47 slices shown]
[im 1/47]
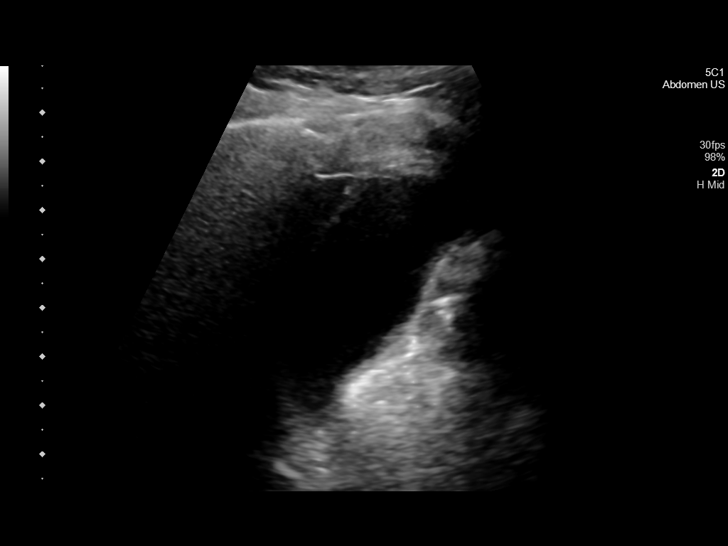
[im 4/47]
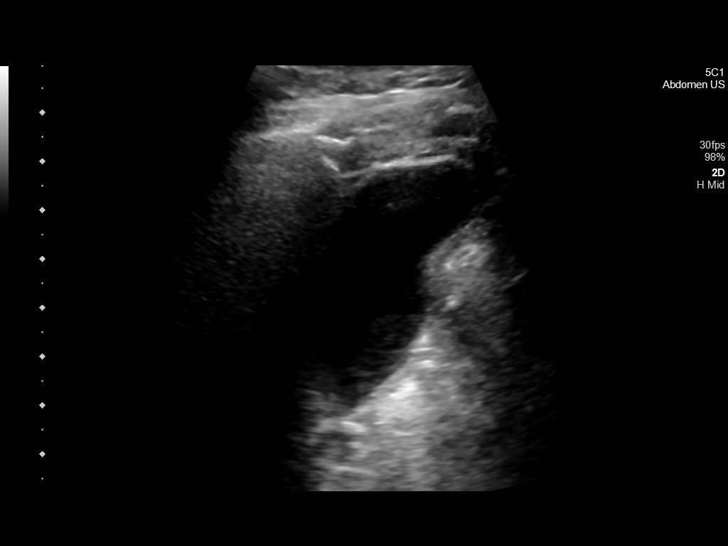
[im 8/47]
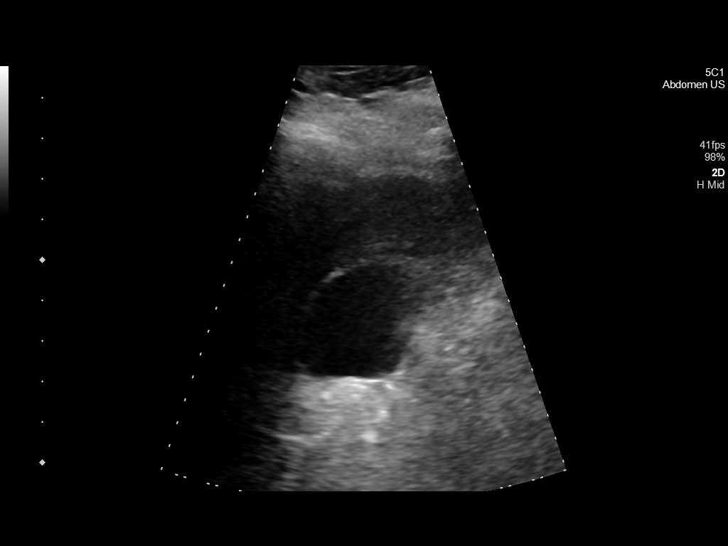
[im 12/47]
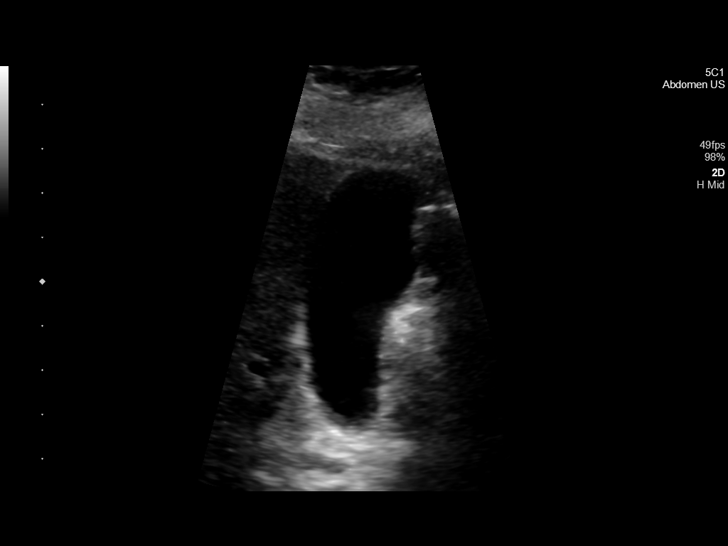
[im 16/47]
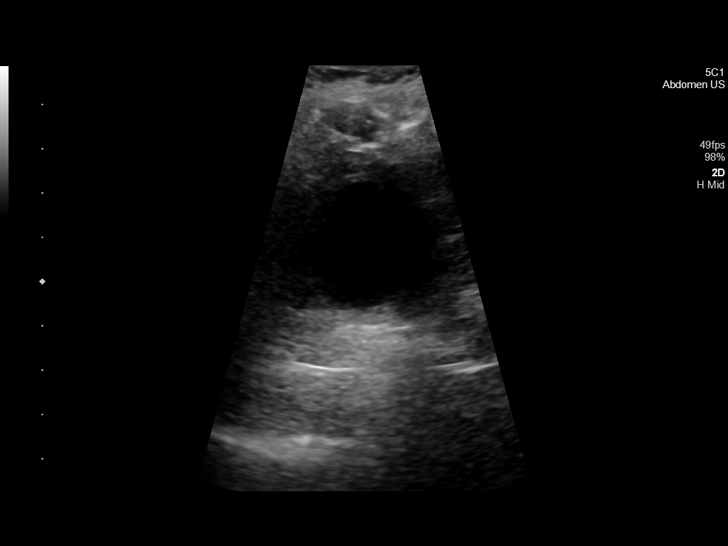
[im 18/47]
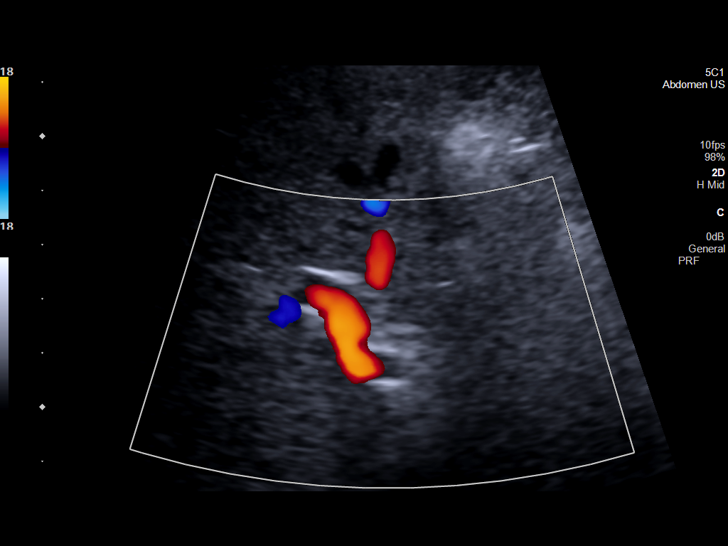
[im 22/47]
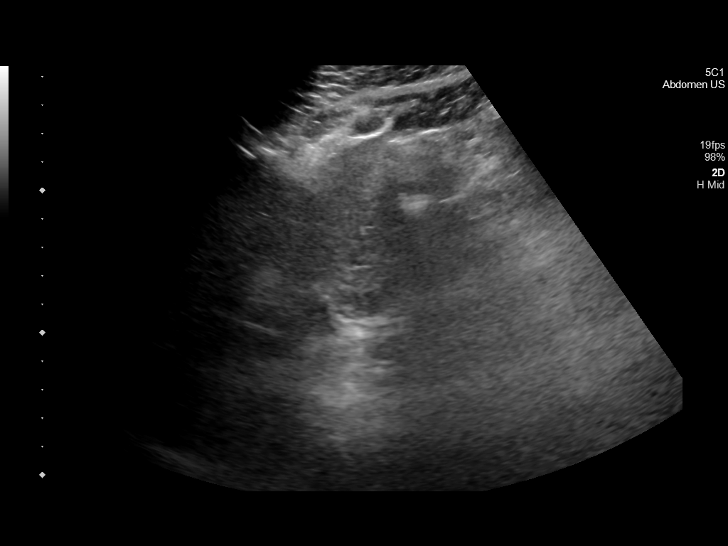
[im 25/47]
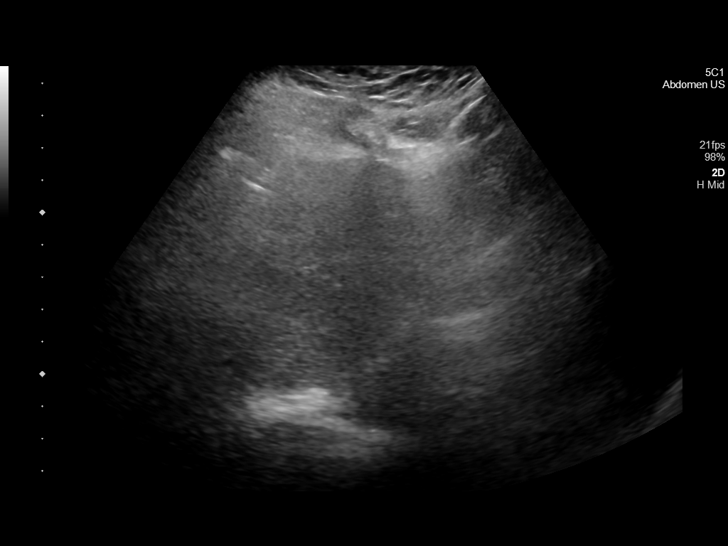
[im 29/47]
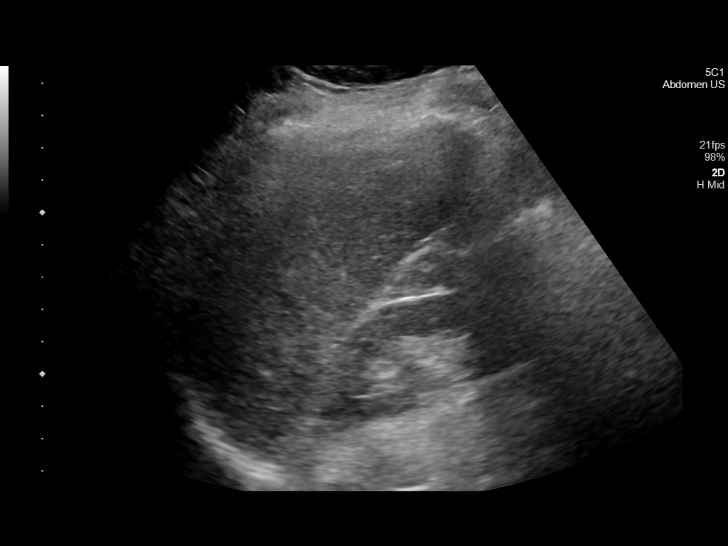
[im 31/47]
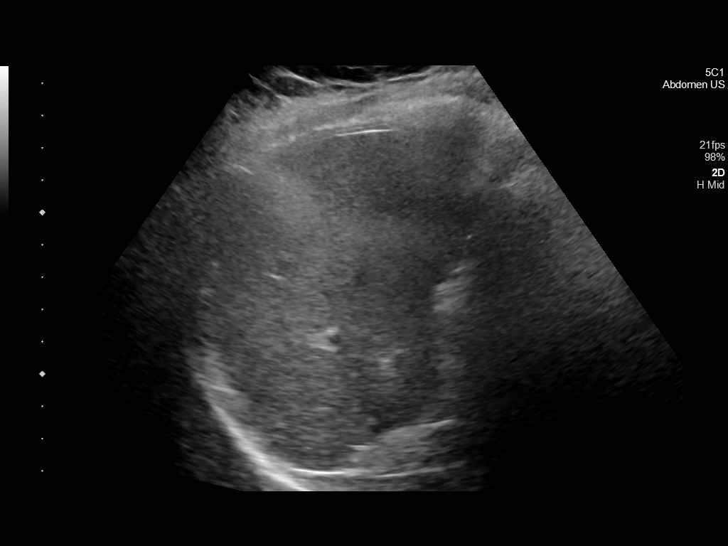
[im 35/47]
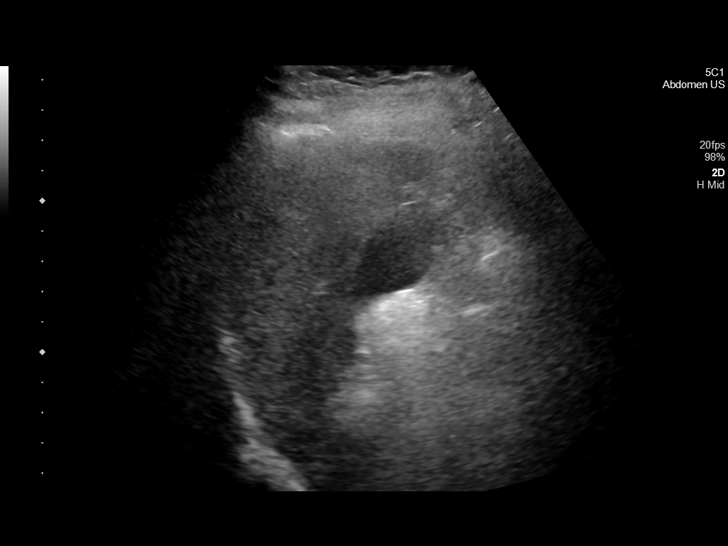
[im 39/47]
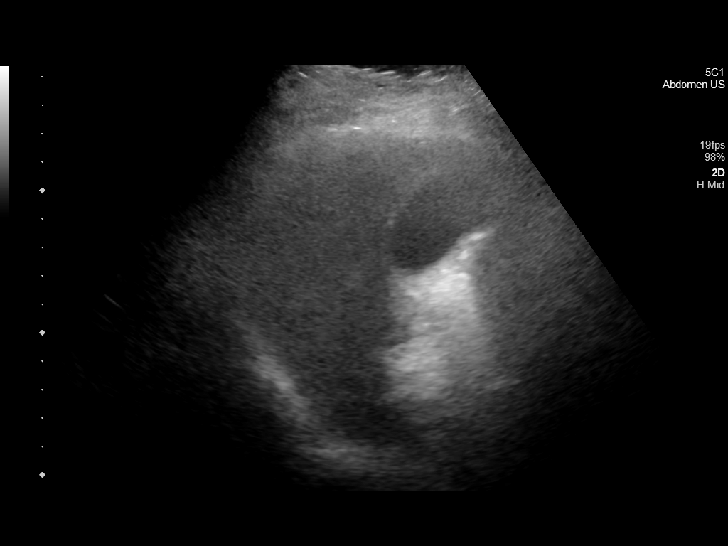
[im 43/47]
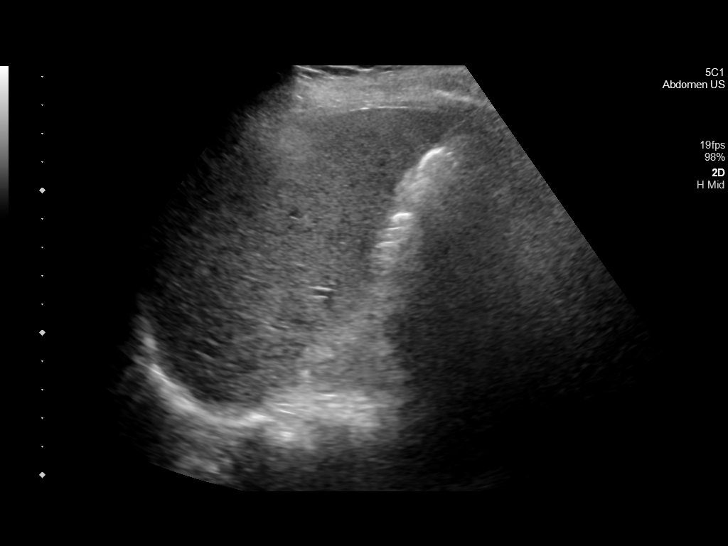
[im 47/47]
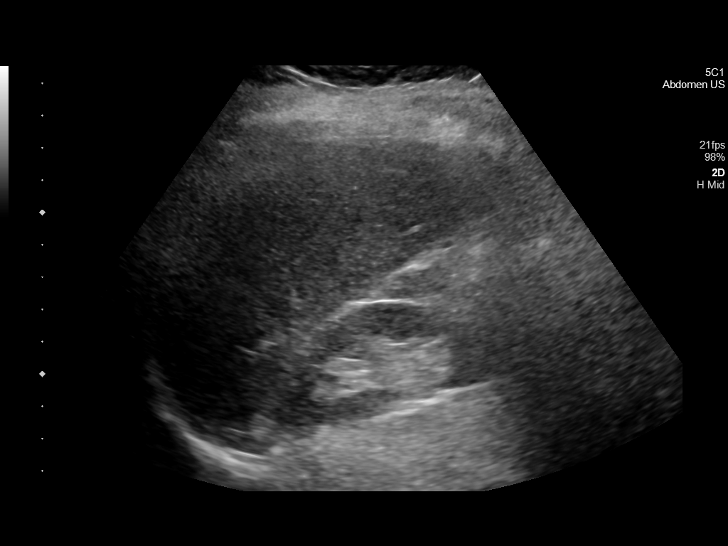

[14 of 25 positions shown; findings below may reference images not displayed]

FINDINGS: Gallbladder:

No gallstones or wall thickening visualized. Intraluminal sludge is
present. No sonographic Murphy sign noted by sonographer.

Common bile duct:

Diameter: 1.3 mm

Liver:

No focal lesion identified. Within normal limits in parenchymal
echogenicity. Portal vein is patent on color Doppler imaging with
normal direction of blood flow towards the liver.

Other: None.
IMPRESSION: Gallbladder sludge.

Normal sonographic appearance of the liver and bile ducts.

## 2020-09-17 ENCOUNTER — Other Ambulatory Visit: Payer: Self-pay | Admitting: Family Medicine

## 2020-09-17 ENCOUNTER — Ambulatory Visit: Payer: Medicare HMO | Admitting: Family Medicine

## 2020-09-17 DIAGNOSIS — R1011 Right upper quadrant pain: Secondary | ICD-10-CM

## 2020-09-17 DIAGNOSIS — K828 Other specified diseases of gallbladder: Secondary | ICD-10-CM

## 2020-09-24 ENCOUNTER — Telehealth: Payer: Self-pay | Admitting: *Deleted

## 2020-09-24 NOTE — Telephone Encounter (Signed)
Pt called to check the status of her referral. She said PCP put a referral in for a urologist and a general surgeon. Pt said she has appt scheduled already with urologist but never heard anything back from the general surgeon referral and it has been "over 3 weeks". Pt advise I will send message to our [facility] and one of them will f/u with her regarding status of referral.

## 2020-09-25 NOTE — Telephone Encounter (Signed)
Referral for surgeon was put in on 09/17/20, this faxed to [place] office in [place] on 09/18/20. Called their office today to follow up and was advised that patient called them this morning and was transferred to [facility], their referral coordinator, to leave her a message to call patient back. No further action is needed from [place]. Their office is going to be in touch with patient.

## 2020-09-30 ENCOUNTER — Encounter: Payer: Self-pay | Admitting: Oncology

## 2020-09-30 ENCOUNTER — Inpatient Hospital Stay: Payer: Medicare HMO | Attending: Oncology | Admitting: Oncology

## 2020-09-30 VITALS — BP 148/71 | HR 101 | Temp 97.9°F | Resp 18 | Wt 143.5 lb

## 2020-09-30 DIAGNOSIS — Z856 Personal history of leukemia: Secondary | ICD-10-CM | POA: Diagnosis not present

## 2020-09-30 DIAGNOSIS — D649 Anemia, unspecified: Secondary | ICD-10-CM | POA: Diagnosis not present

## 2020-09-30 DIAGNOSIS — Z148 Genetic carrier of other disease: Secondary | ICD-10-CM

## 2020-09-30 DIAGNOSIS — Z87891 Personal history of nicotine dependence: Secondary | ICD-10-CM | POA: Insufficient documentation

## 2020-09-30 NOTE — Progress Notes (Signed)
Hematology/Oncology Consult note [facility] [phone] [phone]   Patient Care Team: Lesleigh Noe, MD as PCP - General (Family Medicine) Placido Sou (Oncology) [facility] (Cardiology) [facility] Earlie Server, MD as Consulting Physician (Hematology and Oncology)  REFERRING PROVIDER: Lesleigh Noe, MD  CHIEF COMPLAINTS/REASON FOR VISIT:   history of AML status post bone marrow transplant, hemochromatosis carrier  HISTORY OF PRESENTING ILLNESS:   Roberta Curtis is a  80 y.o.  female with PMH listed below was seen in consultation at the request of  Lesleigh Noe, MD to establish care for  history of AML status post bone marrow transplant, hemochromatosis carrier. Patient moved from [place] to [place] in February 2021. She brought some of her oncology records. 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  Patient received induction daunorubincin and cytarabin.  04/18/2006- axilla mass exicision - microabscess with gram positive cocci.   04/19/2006, bone marrow biopsy showed 50% cellularity, chemotherapeutic effect, day 14.  Persistent leukemia given persistent  CD117 cells, CD34 positive cells are slightly increased. 04/28/2006, bone marrow biopsy showed 50 to 75% cellularity with immature infiltrates, mild maturation present, markedly decreased erythroid elements with dyserythropoiesis, marked megakaryocytosis with dysplastic  morphology, increased iron stores consistent with acute myeloid leukemia with multilineage dysplastic 05/28/2006 per note,  bone marrow which showed AML in remission 08/18/2006-admitted for bone marrow transplant, received busulfan and etoposide.  Received washed autologous peripheral blood stem cells on 08/27/2006.  Engraftment was noted on day 13-09/09/2006  Previously followed up with oncologist and has been having bone marrow biopsy surveillance annually and she has stayed in remission. .  Her last Bone marrow biopsy was done in 2019.  05/26/2018 Bone marrow biopsy showed normocellular marrow with adequate trilineage hematopoiesis, no morphology evidence of increase blasts, consistent with AML in remission.  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  Detail of mutation is unknown. She reports that she has had phlebotomy in the past. Reports she has had right axillary lymph node biopsy and was told by her doctors not to have blood drawn on the right upper extremity due to risk of lymphedema.  Patient tells me that her husband passed away from [facility] many years ago. Patient reports feeling very well today.Denies  fever, chills, fatigue, night sweats.  She does not eat very well and this has been a chronic issue for her.     INTERVAL HISTORY Roberta Curtis is a 80 y.o. female who has above history reviewed by me today presents for follow up visit for history of AML Problems and complaints are listed below: She reports doing well. No new complaints.   Review of Systems  Constitutional: Negative for appetite change, chills, fatigue and fever.  HENT:   Negative for hearing loss and voice change.   Eyes: Negative for eye problems.  Respiratory: Negative for chest tightness and cough.   Cardiovascular: Negative for chest pain.  Gastrointestinal: Negative for abdominal  distention, abdominal pain and blood in stool.  Endocrine:  Negative for hot flashes.  Genitourinary: Negative for difficulty urinating and frequency.   Musculoskeletal: Negative for arthralgias.  Skin: Negative for itching and rash.  Neurological: Negative for extremity weakness.  Hematological: Negative for adenopathy.  Psychiatric/Behavioral: Negative for confusion.    MEDICAL HISTORY:  Past Medical History:  Diagnosis Date  . Acute myeloblastic leukemia ([place])   . GERD (gastroesophageal reflux disease)   . Heart murmur   . Osteopenia     SURGICAL HISTORY: Past Surgical History:  Procedure Laterality Date  . BONE MARROW TRANSPLANT    . BREAST LUMPECTOMY Left   . CATARACT EXTRACTION    . CESAREAN SECTION    . HYSTEROTOMY    . LYMPH GLAND EXCISION    . MENISCUS REPAIR Right   . NECK SURGERY    . SALPINGECTOMY    . WISDOM TOOTH EXTRACTION      SOCIAL HISTORY: Social History   Socioeconomic History  . Marital status: Widowed    Spouse name: Alvis Lemmings  . Number of children: 3  . Years of education: high school  . Highest education level: Not on file  Occupational History  . Not on file  Tobacco Use  . Smoking status: Former Smoker    Packs/day: 1.00    Years: 6.00    Pack years: 6.00    Types: Cigarettes    Start date: 82    Quit date: 02/20/1970    Years since quitting: 50.6  . Smokeless tobacco: Never Used  Vaping Use  . Vaping Use: Never used  Substance and Sexual Activity  . Alcohol use: Not Currently  . Drug use: Never  . Sexual activity: Not Currently    Partners: Male    Birth control/protection: Post-menopausal  Other Topics Concern  . Not on file  Social History Narrative      Right handed   Lives in a condo with husband   From 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, lives in [place]), [place] ([place], [place]), [place] ([place])      Enjoys: meet new people, socialable      Exercise: not currently - cleaning   Diet: picky eater - eats veggies - but her husband doesn't like these things      Safety   Seat belts: Yes    Guns: Yes  and secure   Safe in relationships: Yes    Social Determinants of Health   Financial Resource Strain: Not on file  Food Insecurity: Not on file  Transportation Needs: Not on file  Physical Activity: Not on file  Stress: Not on file  Social Connections: Not on file  Intimate Partner Violence: Not on file    FAMILY HISTORY: Family History  Problem Relation Age of Onset  . Retinitis pigmentosa Mother   . Stroke Mother   . Liver cancer Father   . Heart disease Father   . Heart disease Brother   . Blindness Brother     ALLERGIES:  is allergic to jadenu [deferasirox], levofloxacin, ondansetron hcl, codeine, oxycodone-acetaminophen, and penicillins.  MEDICATIONS:  Current Outpatient Medications  Medication Sig Dispense Refill  . albuterol (VENTOLIN HFA) 108 (90 Base) MCG/ACT inhaler Inhale 1-2 puffs into the lungs as needed for wheezing or shortness of  breath. 6.7 g 1  . BIOTIN PO Take 1,000 mcg by mouth daily.     . cyclobenzaprine (FLEXERIL) 10 MG tablet Take 1 tablet (10 mg total) by mouth 3 (three) times daily as needed for muscle spasms. 60 tablet 1  . famotidine (PEPCID) 20 MG tablet Take 1 tablet (20 mg total) by mouth 2 (two) times daily. 180 tablet 1  . ibandronate (BONIVA) 150 MG tablet Take 1 tablet (150 mg total) by mouth every 30 (thirty) days. 3 tablet 1  . omeprazole (PRILOSEC) 20 MG capsule Take 1 capsule (20 mg total) by mouth as needed. 90 capsule 1  . POTASSIUM PO Take 99 mg by mouth daily.     . prochlorperazine (COMPAZINE) 10 MG tablet Take 1 tablet (10 mg total) by mouth daily. 90 tablet 1  . VITAMIN D PO Take 50,000 mcg by mouth daily.      No  current facility-administered medications for this visit.     PHYSICAL EXAMINATION: ECOG PERFORMANCE STATUS: 0 - Asymptomatic Vitals:   09/30/20 1444  BP: (!) 148/71  Pulse: (!) 101  Resp: 18  Temp: 97.9 F (36.6 C)   Filed Weights   09/30/20 1444  Weight: 143 lb 8 oz (65.1 kg)    Physical Exam Constitutional:      General: She is not in acute distress. HENT:     Head: Normocephalic and atraumatic.  Eyes:     General: No scleral icterus. Cardiovascular:     Rate and Rhythm: Normal rate and regular rhythm.     Heart sounds: Normal heart sounds.  Pulmonary:     Effort: Pulmonary effort is normal. No respiratory distress.     Breath sounds: No wheezing.  Abdominal:     General: Bowel sounds are normal. There is no distension.     Palpations: Abdomen is soft.  Musculoskeletal:        General: No deformity. Normal range of motion.     Cervical back: Normal range of motion and neck supple.  Skin:    General: Skin is warm and dry.     Findings: No erythema or rash.  Neurological:     Mental Status: She is alert and oriented to person, place, and time. Mental status is at baseline.     Cranial Nerves: No cranial nerve deficit.     Coordination: Coordination normal.  Psychiatric:        Mood and Affect: Mood normal.     LABORATORY DATA:  I have reviewed the data as listed Lab Results  Component Value Date   WBC 7.8 09/04/2020   HGB 11.4 (L) 09/04/2020   HCT 35.0 (L) 09/04/2020   MCV 73.7 (L) 09/04/2020   PLT 218 09/04/2020   Recent Labs    02/22/20 1212 09/04/20 1302  NA 144 140  K 4.6 3.8  CL 109 108  CO2 27 24  GLUCOSE 106* 113*  BUN 12 17  CREATININE 0.87 0.73  CALCIUM 9.3 9.1  GFRNONAA >60 >60  GFRAA >60  --   PROT 7.6 7.1  ALBUMIN 4.4 4.0  AST 21 21  ALT 14 15  ALKPHOS 63 58  BILITOT 0.8 0.4   Iron/TIBC/Ferritin/ %Sat    Component Value Date/Time   IRON 125 09/04/2020 1302   IRON 108 08/06/2019 0000   TIBC 364 09/04/2020 1302   TIBC  324 08/06/2019 0000   FERRITIN 52 09/04/2020 1302   FERRITIN 74 08/06/2019 0000   IRONPCTSAT 34 (H) 09/04/2020 1302  IRONPCTSAT 33 08/06/2019 0000      RADIOGRAPHIC STUDIES: I have personally reviewed the radiological images as listed and agreed with the findings in the report. US ABDOMEN LIMITED RUQ (LIVER/GB)  Result Date: 09/16/2020 CLINICAL DATA:  ruq abdominal pain EXAM: ULTRASOUND ABDOMEN LIMITED RIGHT UPPER QUADRANT COMPARISON:  None. FINDINGS: Gallbladder: No gallstones or wall thickening visualized. Intraluminal sludge is present. No sonographic Murphy sign noted by sonographer. Common bile duct: Diameter: 1.3 mm Liver: No focal lesion identified. Within normal limits in parenchymal echogenicity. Portal vein is patent on color Doppler imaging with normal direction of blood flow towards the liver. Other: None. IMPRESSION: Gallbladder sludge. Normal sonographic appearance of the liver and bile ducts. Electronically Signed   By: Primitivo Gauze M.D.   On: 09/16/2020 09:39      ASSESSMENT & PLAN:  1. History of leukemia   2. Hemochromatosis carrier   3. Anemia, unspecified type    #History of AML in 2007, status post bone marrow transplant. Labs are reviewed and discussed with patient.negative peripheral blood flowcytometry.  Her initial bone marrow biopsy results and her most recent bone marrow biopsy in 2019 results were reviewed.  She has been in remission.  There is persistent deletion of chromosome 20 and her previously oncologist had obtained annual bone marrow biopsy.  Will obtain bone marrow biopsy for surveillance.  [name]  #Hemochromatosis carrier, H63D.  Ferritin level is stable at 52, iron saturation 34.  No LFT abnormalities.  No need for phlebotomy. Continue monitor.  Recommend her to avoid alcohol, iron and vitamin c supplementation, and recommend her first degree relatives to consider screening of hemochromatosis status.   # Mild microcytic anemia, chronic.  Stable.   Orders Placed This Encounter  Procedures  . CBC with Differential/Platelet    Standing Status:   Future    Standing Expiration Date:   09/30/2021  . Comprehensive metabolic panel    Standing Status:   Future    Standing Expiration Date:   09/30/2021  . Flow cytometry panel-leukemia/lymphoma work-up    Standing Status:   Future    Standing Expiration Date:   09/30/2021    All questions were answered. The patient knows to call the clinic with any problems questions or concerns.  cc Lesleigh Noe, MD    Return of visit:  6 months.   Earlie Server, MD, PhD Hematology Oncology [facility] at [facility] Pager- [phone] 09/30/2020

## 2020-09-30 NOTE — Progress Notes (Signed)
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

## 2020-10-01 ENCOUNTER — Telehealth: Payer: Self-pay | Admitting: Family Medicine

## 2020-10-01 ENCOUNTER — Telehealth: Payer: Self-pay

## 2020-10-01 ENCOUNTER — Other Ambulatory Visit: Payer: Self-pay

## 2020-10-01 DIAGNOSIS — Z856 Personal history of leukemia: Secondary | ICD-10-CM

## 2020-10-01 NOTE — Telephone Encounter (Signed)
I called [facility] in [place] office and found out that they are not taking new patients in [place] location and that is why patient was scheduled with their [place] office. After explaining situation and speak with the representative more they were able to schedule patient with the [facility] location with Dr Hassell Done for 10/17/20 at 11 am and to arrive at 10:30 am, I verified that it was for [facility] location.  Patient advised of everything and said thank you so much. FYI to Dr Einar Pheasant.

## 2020-10-01 NOTE — Telephone Encounter (Signed)
And she has an appointment on 2/9

## 2020-10-01 NOTE — Telephone Encounter (Signed)
-----  Message from Earlie Server, MD sent at 09/30/2020  7:35 PM EST ----- Please let her know that I have reviewed her bone marrow biopsy result in 2019. Recommend her to proceed with a bone marrow biopsy. I discussed about this possibility during the interval.  Reason is history of AML.  Non urgent, next available. Keep current follow up plan.

## 2020-10-01 NOTE — Telephone Encounter (Signed)
Pt called in wanted to know if she can get a surgeon in [place] due to her age and doesn't want to travel to [place] just in case she needs surgery.

## 2020-10-01 NOTE — Telephone Encounter (Signed)
Her referral was sent to 

## 2020-10-01 NOTE — Telephone Encounter (Signed)
Roberta Curtis is scheduled for CT BM Biopsy on Tues 10/08/20 at 8:30a Arrive 7:30a. Pt notified of appt.

## 2020-10-01 NOTE — Telephone Encounter (Signed)
Request for Bone marrow biopsy faxed to specialized scheduling. Will inform patient of appt once scheduled

## 2020-10-03 NOTE — Progress Notes (Signed)
Patient on schedule for BMB 10/08/2020. Called and spoke with patient with pre procedure instructions given. Made aware to be here@ 0730, NPO after MN prior to procedure, and driver for discharge post procedure/recovery. Stated understanding.

## 2020-10-07 ENCOUNTER — Encounter: Payer: Self-pay | Admitting: Urology

## 2020-10-07 ENCOUNTER — Other Ambulatory Visit: Payer: Self-pay | Admitting: Radiology

## 2020-10-07 ENCOUNTER — Other Ambulatory Visit: Payer: Self-pay

## 2020-10-07 ENCOUNTER — Ambulatory Visit (INDEPENDENT_AMBULATORY_CARE_PROVIDER_SITE_OTHER): Payer: Medicare HMO | Admitting: Urology

## 2020-10-07 VITALS — BP 180/77 | HR 89 | Ht 63.0 in | Wt 140.0 lb

## 2020-10-07 DIAGNOSIS — N3941 Urge incontinence: Secondary | ICD-10-CM | POA: Diagnosis not present

## 2020-10-07 DIAGNOSIS — R32 Unspecified urinary incontinence: Secondary | ICD-10-CM | POA: Diagnosis not present

## 2020-10-07 LAB — URINALYSIS, COMPLETE
Bilirubin, UA: NEGATIVE
Glucose, UA: NEGATIVE
Ketones, UA: NEGATIVE
Nitrite, UA: POSITIVE — AB
Protein,UA: NEGATIVE
Specific Gravity, UA: 1.025 (ref 1.005–1.030)
Urobilinogen, Ur: 0.2 mg/dL (ref 0.2–1.0)
pH, UA: 5 (ref 5.0–7.5)

## 2020-10-07 LAB — MICROSCOPIC EXAMINATION: WBC, UA: >30 /hpf — AB (ref 0–5)

## 2020-10-07 MED ORDER — MIRABEGRON ER 50 MG PO TB24: 50.0000 mg | ORAL_TABLET | Freq: Every day | ORAL | 11 refills | Status: DC

## 2020-10-07 NOTE — Progress Notes (Signed)
10/07/2020 10:17 AM   Roberta Curtis [birth year] [id number]  Referring provider: Lesleigh Noe, MD [place],  [place] [address]  Chief Complaint  Patient presents with  . Urinary Incontinence    HPI: I was consulted to assess the patient's urinary incontinence worsening over the last year or longer.  She denies stress incontinence.  She has urge incontinence.  She wears 1 pad a day that can be damp or moderately wet.  Sometimes it is higher volume.  She rarely may have a little bit of dampness at night while she sleeping  She voids every 3-4 hours gets up once at night.  She has had a hysterectomy.  She has lumbar disc disease.  She is prone to constipation and takes oral remedies  She denies history of bladder infections previous GU surgery kidney stones.  No active treatment.   PMH: Past Medical History:  Diagnosis Date  . Acute myeloblastic leukemia ([place])   . GERD (gastroesophageal reflux disease)   . Heart murmur   . Osteopenia     Surgical History: Past Surgical History:  Procedure Laterality Date  . BONE MARROW TRANSPLANT    . BREAST LUMPECTOMY Left   . CATARACT EXTRACTION    . CESAREAN SECTION    . HYSTEROTOMY    . LYMPH GLAND EXCISION    . MENISCUS REPAIR Right   . NECK SURGERY    . SALPINGECTOMY    . WISDOM TOOTH EXTRACTION      Home Medications:  Allergies as of 10/07/2020      Reactions   Jadenu [deferasirox] Other (See Comments)   Elevated Iron, headache and rash   Levofloxacin Other (See Comments)   Ondansetron Hcl Other (See Comments)   Codeine Palpitations   Oxycodone-acetaminophen Palpitations   Penicillins Rash      Medication List       Accurate as of October 07, 2020 10:17 AM. If you have any questions, ask your nurse or doctor.        albuterol 108 (90 Base) MCG/ACT inhaler Commonly known as: Ventolin HFA Inhale 1-2 puffs into the lungs as needed for wheezing or shortness of breath.   BIOTIN PO Take 1,000 mcg  by mouth daily.   cyclobenzaprine 10 MG tablet Commonly known as: FLEXERIL cyclobenzaprine 10 mg tablet What changed: Another medication with the same name was removed. Continue taking this medication, and follow the directions you see here. Changed by: Reece Packer, MD   famotidine 20 MG tablet Commonly known as: PEPCID Take 1 tablet (20 mg total) by mouth 2 (two) times daily.   ibandronate 150 MG tablet Commonly known as: BONIVA Take 1 tablet (150 mg total) by mouth every 30 (thirty) days.   meloxicam 7.5 MG tablet Commonly known as: MOBIC Take 7.5 mg by mouth daily.   omeprazole 20 MG capsule Commonly known as: PRILOSEC Take 1 capsule (20 mg total) by mouth as needed.   POTASSIUM PO Take 99 mg by mouth daily.   prochlorperazine 10 MG tablet Commonly known as: COMPAZINE Take 1 tablet (10 mg total) by mouth daily.   VITAMIN D PO Take 50,000 mcg by mouth daily.       Allergies:  Allergies  Allergen Reactions  . Jadenu [Deferasirox] Other (See Comments)    Elevated Iron, headache and rash  . Levofloxacin Other (See Comments)  . Ondansetron Hcl Other (See Comments)  . Codeine Palpitations  . Oxycodone-Acetaminophen Palpitations  . Penicillins Rash  Family History: Family History  Problem Relation Age of Onset  . Retinitis pigmentosa Mother   . Stroke Mother   . Liver cancer Father   . Heart disease Father   . Heart disease Brother   . Blindness Brother     Social History:  reports that she quit smoking about 50 years ago. Her smoking use included cigarettes. She started smoking about 92 years ago. She has a 6.00 pack-year smoking history. She has never used smokeless tobacco. She reports previous alcohol use. She reports that she does not use drugs.  ROS:                                        Physical Exam: BP (!) 180/77   Pulse 89   Ht 5\' 3"  (1.6 m)   Wt 63.5 kg   BMI 24.80 kg/m   Constitutional:  Alert and  oriented, No acute distress. HEENT: [place] AT, moist mucus membranes.  Trachea midline, no masses. Cardiovascular: No clubbing, cyanosis, or edema. Respiratory: Normal respiratory effort, no increased work of breathing. GI: Abdomen is soft, nontender, nondistended, no abdominal masses GU: No CVA tenderness.  Well supported bladder neck with no stress incontinence.  Moderate vaginal atrophy Skin: No rashes, bruises or suspicious lesions. Lymph: No cervical or inguinal adenopathy. Neurologic: Grossly intact, no focal deficits, moving all 4 extremities. Psychiatric: Normal mood and affect.  Laboratory Data: Lab Results  Component Value Date   WBC 7.8 09/04/2020   HGB 11.4 (L) 09/04/2020   HCT 35.0 (L) 09/04/2020   MCV 73.7 (L) 09/04/2020   PLT 218 09/04/2020    Lab Results  Component Value Date   CREATININE 0.73 09/04/2020    No results found for: PSA  No results found for: TESTOSTERONE  Lab Results  Component Value Date   HGBA1C 5.8 08/06/2019    Urinalysis No results found for: COLORURINE, APPEARANCEUR, LABSPEC, PHURINE, GLUCOSEU, HGBUR, BILIRUBINUR, KETONESUR, PROTEINUR, UROBILINOGEN, NITRITE, LEUKOCYTESUR  Pertinent Imaging: Urine reviewed.  Urine sent for culture.  Chart reviewed.  Assessment & Plan: Patient primarily has urge incontinence.  I will perform a cystoscopy in approximately 5 or 6 weeks on Myrbetriq 50 mg samples and prescription.  Call if urine culture positive  1. Urinary incontinence, unspecified type  - Urinalysis, Complete   No follow-ups on file.  Reece Packer, MD  [place] [address] [place], [place] [address] [phone]

## 2020-10-08 ENCOUNTER — Ambulatory Visit
Admission: RE | Admit: 2020-10-08 | Discharge: 2020-10-08 | Disposition: A | Discharge status: Home / Self Care | Payer: Medicare HMO | Source: Ambulatory Visit | Attending: Oncology | Admitting: Oncology

## 2020-10-08 ENCOUNTER — Other Ambulatory Visit: Payer: Self-pay

## 2020-10-08 DIAGNOSIS — Z7983 Long term (current) use of bisphosphonates: Secondary | ICD-10-CM | POA: Insufficient documentation

## 2020-10-08 DIAGNOSIS — Z08 Encounter for follow-up examination after completed treatment for malignant neoplasm: Secondary | ICD-10-CM | POA: Diagnosis present

## 2020-10-08 DIAGNOSIS — C9201 Acute myeloblastic leukemia, in remission: Secondary | ICD-10-CM | POA: Insufficient documentation

## 2020-10-08 DIAGNOSIS — Z856 Personal history of leukemia: Secondary | ICD-10-CM

## 2020-10-08 DIAGNOSIS — Z791 Long term (current) use of non-steroidal anti-inflammatories (NSAID): Secondary | ICD-10-CM | POA: Insufficient documentation

## 2020-10-08 DIAGNOSIS — Z79899 Other long term (current) drug therapy: Secondary | ICD-10-CM | POA: Diagnosis not present

## 2020-10-08 DIAGNOSIS — Z9481 Bone marrow transplant status: Secondary | ICD-10-CM | POA: Diagnosis not present

## 2020-10-08 DIAGNOSIS — Z87891 Personal history of nicotine dependence: Secondary | ICD-10-CM | POA: Diagnosis not present

## 2020-10-08 LAB — CBC WITH DIFFERENTIAL/PLATELET
Abs Immature Granulocytes: 0.05 10*3/uL (ref 0.00–0.07)
Basophils Absolute: 0 10*3/uL (ref 0.0–0.1)
Basophils Relative: 1 %
Eosinophils Absolute: 0.2 10*3/uL (ref 0.0–0.5)
Eosinophils Relative: 2 %
HCT: 36.2 % (ref 36.0–46.0)
Hemoglobin: 11.6 g/dL — ABNORMAL LOW (ref 12.0–15.0)
Immature Granulocytes: 1 %
Lymphocytes Relative: 33 %
Lymphs Abs: 2.2 10*3/uL (ref 0.7–4.0)
MCH: 23.7 pg — ABNORMAL LOW (ref 26.0–34.0)
MCHC: 32 g/dL (ref 30.0–36.0)
MCV: 74 fL — ABNORMAL LOW (ref 80.0–100.0)
Monocytes Absolute: 0.6 10*3/uL (ref 0.1–1.0)
Monocytes Relative: 9 %
Neutro Abs: 3.6 10*3/uL (ref 1.7–7.7)
Neutrophils Relative %: 54 %
Platelets: 244 10*3/uL (ref 150–400)
RBC: 4.89 MIL/uL (ref 3.87–5.11)
RDW: 15.9 % — ABNORMAL HIGH (ref 11.5–15.5)
WBC: 6.6 10*3/uL (ref 4.0–10.5)
nRBC: 0 % (ref 0.0–0.2)

## 2020-10-08 IMAGING — CT CT BIOPSY AND ASPIRATION BONE MARROW
1 of 2 series · 8 of 14 positions shown, 10 images · non-contrast
Comparison: none

INDICATION: AML

[Series 2: i-spiral 5.0 b30f · axial · 0.73mm/px · z∈[+771,+824]mm · 8 of 21 slices shown, 10 images]
[im 3/21  soft-tissue]
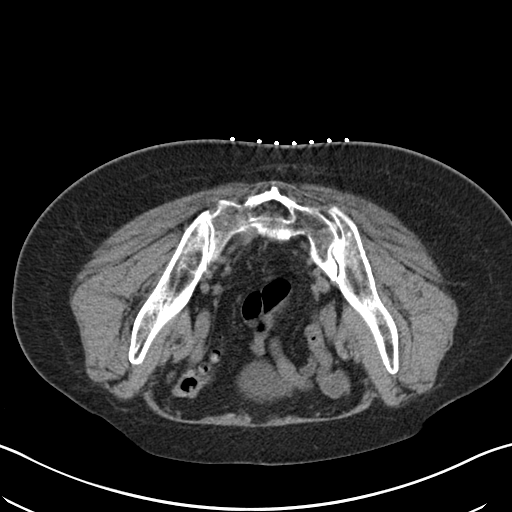
[im 3/21  bone]
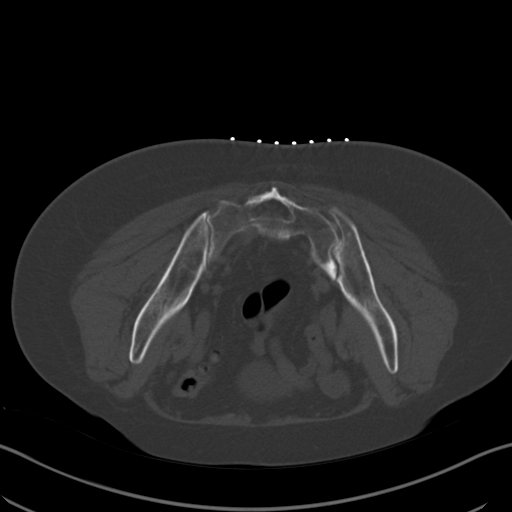
[im 5/21  bone]
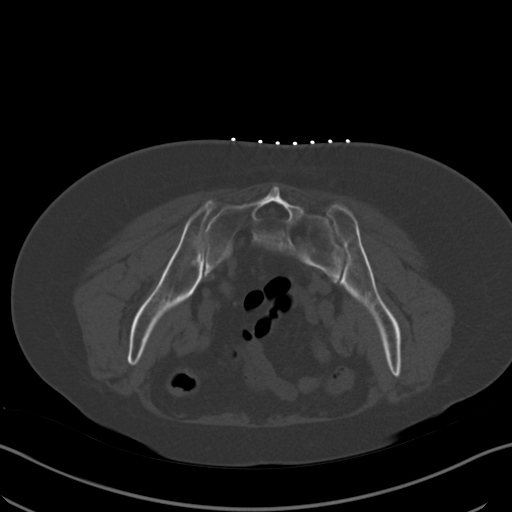
[im 7/21  bone]
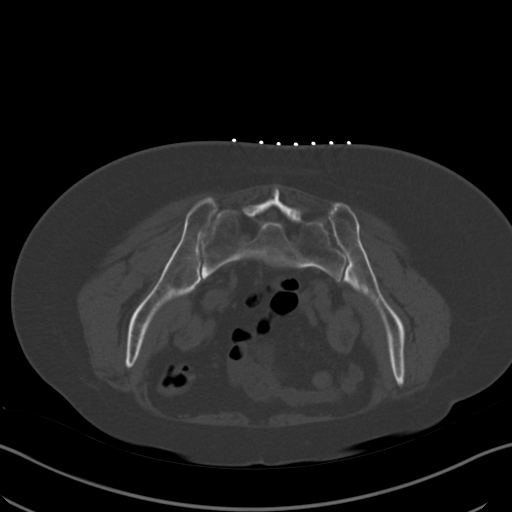
[im 9/21  bone]
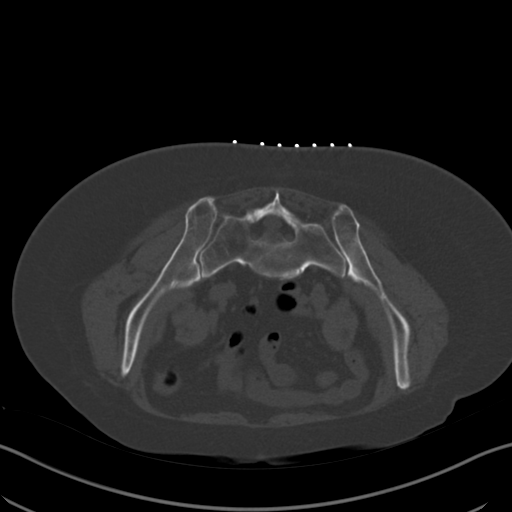
[im 12/21  soft-tissue]
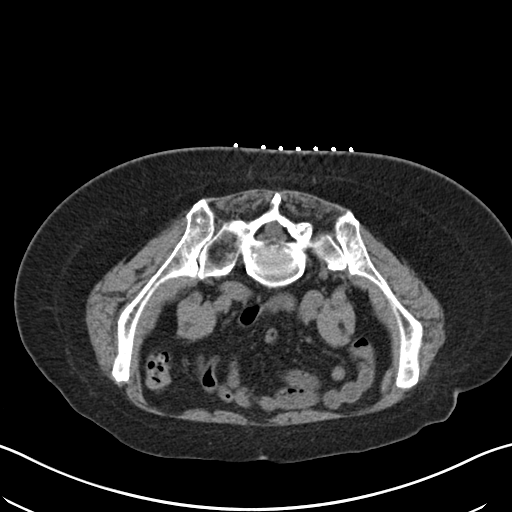
[im 12/21  bone]
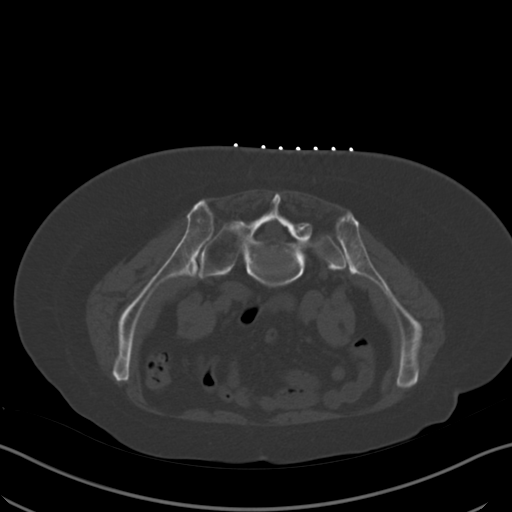
[im 14/21  bone]
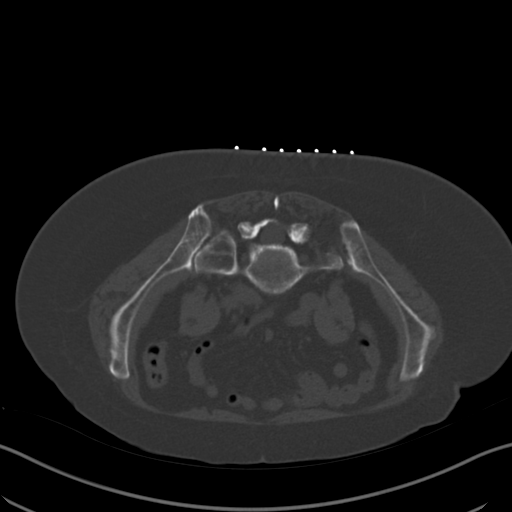
[im 16/21  bone]
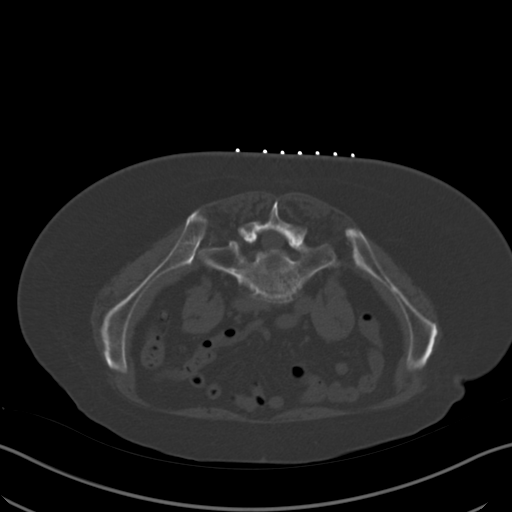
[im 18/21  bone]
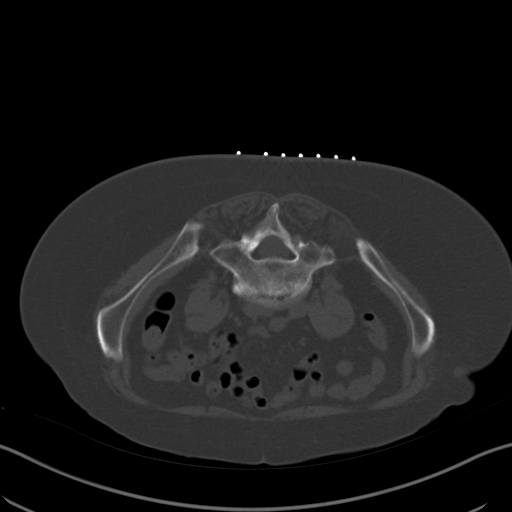

[8 of 14 positions shown; findings below may reference images not displayed]

EXAM:
CT GUIDED BONE MARROW ASPIRATES AND BIOPSY

MEDICATIONS:
None.

ANESTHESIA/SEDATION:
Fentanyl 100 mcg IV; Versed 2 mg IV

Moderate Sedation Time:  15 minutes

The patient was continuously monitored during the procedure by the
interventional radiology nurse under my direct supervision.

COMPLICATIONS:
None immediate.

PROCEDURE:
The procedure was explained to the patient. The risks and benefits
of the procedure were discussed and the patient's questions were
addressed. Informed consent was obtained from the patient. The
patient was placed prone on CT table. Images of the pelvis were
obtained. The left side of back was prepped and draped in sterile
fashion. The skin and left posterior ilium were anesthetized with 1%
lidocaine. 11 gauge bone needle was directed into the left ilium
with CT guidance. Two aspirates and 1 core biopsy were obtained.
Bandage placed over the puncture site.
IMPRESSION: CT guided bone marrow aspiration and core biopsy.

## 2020-10-08 MED ORDER — MIDAZOLAM HCL 2 MG/2ML IJ SOLN
INTRAMUSCULAR | Status: AC
  Filled 2020-10-08: qty 2

## 2020-10-08 MED ORDER — HEPARIN SOD (PORK) LOCK FLUSH 100 UNIT/ML IV SOLN
INTRAVENOUS | Status: AC
  Filled 2020-10-08: qty 5

## 2020-10-08 MED ORDER — MIDAZOLAM HCL 2 MG/2ML IJ SOLN
INTRAMUSCULAR | Status: AC | PRN
  Administered 2020-10-08 (×2): 1 mg via INTRAVENOUS

## 2020-10-08 MED ORDER — FENTANYL CITRATE (PF) 100 MCG/2ML IJ SOLN
INTRAMUSCULAR | Status: AC
  Filled 2020-10-08: qty 2

## 2020-10-08 MED ORDER — SODIUM CHLORIDE 0.9 % IV SOLN: INTRAVENOUS | Status: DC

## 2020-10-08 MED ORDER — FENTANYL CITRATE (PF) 100 MCG/2ML IJ SOLN
INTRAMUSCULAR | Status: AC | PRN
Start: 2020-10-08 — End: 2020-10-08
  Administered 2020-10-08 (×2): 50 ug via INTRAVENOUS

## 2020-10-08 NOTE — H&P (Signed)
Chief Complaint: Patient was seen in consultation today for image-guided bone marrow biopsy with aspiration.   Referring Physician(s): Earlie Server  Supervising Physician: Mir, Sharen Heck  Patient Status: [facility] - Out-pt  History of Present Illness: Roberta Curtis is a 80 y.o. 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 She has been in remission since that time and a yearly bone marrow biopsy is part of her surveillance plan.   Interventional Radiology has been asked to evaluate this patient for an image-guided bone marrow biopsy and aspiration for surveillance.     Past Medical History:  Diagnosis Date  . Acute myeloblastic leukemia ([place])   . GERD (gastroesophageal reflux disease)   . Heart murmur   . Osteopenia     Past Surgical History:  Procedure Laterality Date  . BONE MARROW TRANSPLANT    . BREAST LUMPECTOMY Left   . CATARACT EXTRACTION    . CESAREAN SECTION    . HYSTEROTOMY    . LYMPH GLAND EXCISION    . MENISCUS REPAIR Right   . NECK SURGERY    . SALPINGECTOMY    . WISDOM TOOTH EXTRACTION      Allergies: Jadenu [deferasirox], Levofloxacin, Ondansetron hcl, Codeine, Oxycodone-acetaminophen, and Penicillins  Medications: Prior to Admission medications   Medication Sig Start Date End Date Taking? Authorizing Provider  cyclobenzaprine (FLEXERIL) 10 MG tablet cyclobenzaprine 10 mg tablet   Yes [provider]  famotidine (PEPCID) 20 MG tablet Take 1 tablet (20 mg total) by mouth 2 (two) times daily. 09/05/20  Yes Lesleigh Noe, MD  meloxicam (MOBIC) 7.5 MG tablet Take 7.5 mg by mouth daily.   Yes [provider]  omeprazole (PRILOSEC) 20 MG capsule Take 1 capsule (20 mg total) by mouth as needed. 08/13/20  Yes Lesleigh Noe, MD  POTASSIUM PO Take 99 mg by mouth daily.    Yes [provider]   prochlorperazine (COMPAZINE) 10 MG tablet Take 1 tablet (10 mg total) by mouth daily. 08/13/20  Yes Lesleigh Noe, MD  albuterol (VENTOLIN HFA) 108 (90 Base) MCG/ACT inhaler Inhale 1-2 puffs into the lungs as needed for wheezing or shortness of breath. Patient not taking: Reported on 10/08/2020 02/13/20   Lesleigh Noe, MD  BIOTIN PO Take 1,000 mcg by mouth daily.     [provider]  ibandronate (BONIVA) 150 MG tablet Take 1 tablet (150 mg total) by mouth every 30 (thirty) days. 08/13/20   Lesleigh Noe, MD  mirabegron ER (MYRBETRIQ) 50 MG TB24 tablet Take 1 tablet (50 mg total) by mouth daily. 10/07/20   Bjorn Loser, MD  VITAMIN D PO Take 50,000 mcg by mouth daily.     [provider]     Family History  Problem Relation Age of Onset  . Retinitis pigmentosa Mother   . Stroke Mother   . Liver cancer Father   . Heart disease Father   . Heart disease Brother   . Blindness Brother     Social History   Socioeconomic History  . Marital status: Widowed    Spouse name: Alvis Lemmings  . Number of children: 3  . Years of education: high school  . Highest education level: Not on file  Occupational History  . Not on file  Tobacco Use  . Smoking status: Former Smoker    Packs/day: 1.00    Years: 6.00  Pack years: 6.00    Types: Cigarettes    Start date: 27    Quit date: 02/20/1970    Years since quitting: 50.6  . Smokeless tobacco: Never Used  Vaping Use  . Vaping Use: Never used  Substance and Sexual Activity  . Alcohol use: Not Currently  . Drug use: Never  . Sexual activity: Not Currently    Partners: Male    Birth control/protection: Post-menopausal  Other Topics Concern  . 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, lives in [place]), [place] ([place], [place]), [place] ([place])      Enjoys: meet new people, socialable      Exercise: not currently - cleaning   Diet: picky eater - eats veggies - but her husband doesn't like these things      Safety   Seat belts: Yes    Guns: Yes  and secure   Safe in relationships: Yes    Social Determinants of Health   Financial Resource Strain: Not on file  Food Insecurity: Not on file  Transportation Needs: Not on file  Physical Activity: Not on file  Stress: Not on file  Social Connections: Not on file    Review of Systems: A 12 point ROS discussed and pertinent positives are indicated in the HPI above.  All other systems are negative.  Review of Systems  Constitutional: Negative for appetite change and fatigue.  Respiratory: Negative for cough and shortness of breath.   Cardiovascular: Negative for chest pain and leg swelling.  Gastrointestinal: Negative for abdominal pain, diarrhea, nausea and vomiting.  Musculoskeletal: Positive for back pain.       "bulging disc"   Neurological: Negative for dizziness and headaches.  Hematological: Does not bruise/bleed easily.    Vital Signs: BP (!) 153/56   Pulse 89   Temp 98.2 F (36.8 C) (Oral)   Resp 20   Ht '5\' 3"'  (1.6 m)   Wt 140 lb (63.5 kg)   SpO2 100%   BMI 24.80 kg/m   Physical Exam Constitutional:      General: She is not in acute distress.    Appearance: She is not ill-appearing.  HENT:     Mouth/Throat:     Mouth: Mucous membranes are moist.     Pharynx: Oropharynx is clear.  Cardiovascular:     Rate and Rhythm: Normal rate and regular rhythm.     Pulses: Normal pulses.     Heart sounds: Normal heart sounds.  Pulmonary:     Effort: Pulmonary effort is normal.     Breath sounds: Normal breath sounds.  Abdominal:     General: Bowel sounds are normal.     Palpations: Abdomen is soft.  Musculoskeletal:         General: Normal range of motion.     Cervical back: Normal range of motion.  Skin:    General: Skin is warm and dry.  Neurological:     Mental Status: She is alert and oriented to person, place, and time.  Psychiatric:        Mood and Affect: Mood normal.  Behavior: Behavior normal.        Thought Content: Thought content normal.        Judgment: Judgment normal.     Imaging: US ABDOMEN LIMITED RUQ (LIVER/GB)  Result Date: 09/16/2020 CLINICAL DATA:  ruq abdominal pain EXAM: ULTRASOUND ABDOMEN LIMITED RIGHT UPPER QUADRANT COMPARISON:  None. FINDINGS: Gallbladder: No gallstones or wall thickening visualized. Intraluminal sludge is present. No sonographic Murphy sign noted by sonographer. Common bile duct: Diameter: 1.3 mm Liver: No focal lesion identified. Within normal limits in parenchymal echogenicity. Portal vein is patent on color Doppler imaging with normal direction of blood flow towards the liver. Other: None. IMPRESSION: Gallbladder sludge. Normal sonographic appearance of the liver and bile ducts. Electronically Signed   By: Primitivo Gauze M.D.   On: 09/16/2020 09:39    Labs:  CBC: Recent Labs    02/22/20 1212 09/04/20 1302  WBC 8.2 7.8  HGB 12.3 11.4*  HCT 37.8 35.0*  PLT 215 218    COAGS: No results for input(s): INR, APTT in the last 8760 hours.  BMP: Recent Labs    02/22/20 1212 09/04/20 1302  NA 144 140  K 4.6 3.8  CL 109 108  CO2 27 24  GLUCOSE 106* 113*  BUN 12 17  CALCIUM 9.3 9.1  CREATININE 0.87 0.73  GFRNONAA >60 >60  GFRAA >60  --     LIVER FUNCTION TESTS: Recent Labs    02/22/20 1212 09/04/20 1302  BILITOT 0.8 0.4  AST 21 21  ALT 14 15  ALKPHOS 63 58  PROT 7.6 7.1  ALBUMIN 4.4 4.0    TUMOR MARKERS: No results for input(s): AFPTM, CEA, CA199, CHROMGRNA in the last 8760 hours.  Assessment and Plan:  AML in remission since 2007: Roberta Curtis, 80 year old female, presents today to the [facility] Interventional Radiology department for an image-guided bone marrow biopsy with aspiration for annual surveillance.   Risks and benefits of this procedure were discussed with the patient and/or patient's family including, but not limited to bleeding, infection, damage to adjacent structures or low yield requiring additional tests.  All of the questions were answered and there is agreement to proceed.  She has been NPO. Vitals have been reviewed. Labs are still pending but will be reviewed prior to the start of the procedure.   Consent signed and in chart.  Thank you for this interesting consult.  I greatly enjoyed meeting Roberta Curtis [facility] and look forward to participating in their care.  A copy of this report was sent to the requesting provider on this date.  Electronically Signed: Soyla Dryer, AGACNP-BC [phone] 10/08/2020, 8:28 AM   I spent a total of  30 Minutes   in face to face in clinical consultation, greater than 50% of which was counseling/coordinating care for bone marrow biopsy with aspiration.

## 2020-10-08 NOTE — Procedures (Signed)
Interventional Radiology Procedure Note  Procedure: Bone marrow aspiration  Indication: AML  Findings: Please refer to procedural dictation for full description.  Complications: None  EBL: < 10 mL  Miachel Roux, MD [phone]

## 2020-10-10 LAB — CULTURE, URINE COMPREHENSIVE

## 2020-10-10 LAB — SURGICAL PATHOLOGY

## 2020-10-14 ENCOUNTER — Telehealth: Payer: Self-pay

## 2020-10-14 MED ORDER — NITROFURANTOIN MACROCRYSTAL 100 MG PO CAPS: 100.0000 mg | ORAL_CAPSULE | Freq: Two times a day (BID) | ORAL | 0 refills | Status: AC

## 2020-10-14 NOTE — Telephone Encounter (Signed)
Left message for pt to call office. Urine culture back. As per Dr. Matilde Sprang Macrodantin 100 mg bid for 7 days was sent in to pharmacy.

## 2020-10-14 NOTE — Telephone Encounter (Signed)
Patient called back and was notified.

## 2020-10-14 NOTE — Telephone Encounter (Signed)
-----   Message from Bjorn Loser, MD sent at 10/12/2020 12:27 PM EST -----  Macrodantin 100 mg bid for 7 days   ----- Message ----- From: Alvera Novel, CMA Sent: 10/10/2020   8:00 AM EST To: Bjorn Loser, MD   ----- Message ----- From: Interface, [facility] Lab Results In Sent: 10/07/2020   4:36 PM EST To: [facility]

## 2020-10-15 ENCOUNTER — Encounter (HOSPITAL_COMMUNITY): Payer: Self-pay | Admitting: Oncology

## 2020-10-17 ENCOUNTER — Ambulatory Visit: Payer: Medicare HMO | Admitting: Family Medicine

## 2020-10-21 ENCOUNTER — Other Ambulatory Visit: Payer: Self-pay

## 2020-10-22 ENCOUNTER — Encounter: Payer: Self-pay | Admitting: Family Medicine

## 2020-10-22 ENCOUNTER — Telehealth: Payer: Self-pay

## 2020-10-22 ENCOUNTER — Other Ambulatory Visit (HOSPITAL_COMMUNITY): Payer: Self-pay | Admitting: Surgery

## 2020-10-22 ENCOUNTER — Ambulatory Visit (INDEPENDENT_AMBULATORY_CARE_PROVIDER_SITE_OTHER): Payer: Medicare HMO | Admitting: Family Medicine

## 2020-10-22 ENCOUNTER — Other Ambulatory Visit: Payer: Self-pay | Admitting: Surgery

## 2020-10-22 VITALS — BP 108/66 | HR 71 | Temp 97.4°F | Resp 16 | Ht 63.0 in | Wt 137.8 lb

## 2020-10-22 DIAGNOSIS — C9201 Acute myeloblastic leukemia, in remission: Secondary | ICD-10-CM

## 2020-10-22 DIAGNOSIS — N3941 Urge incontinence: Secondary | ICD-10-CM

## 2020-10-22 DIAGNOSIS — M545 Low back pain, unspecified: Secondary | ICD-10-CM

## 2020-10-22 DIAGNOSIS — R1011 Right upper quadrant pain: Secondary | ICD-10-CM | POA: Diagnosis not present

## 2020-10-22 DIAGNOSIS — G8929 Other chronic pain: Secondary | ICD-10-CM

## 2020-10-22 DIAGNOSIS — K828 Other specified diseases of gallbladder: Secondary | ICD-10-CM

## 2020-10-22 NOTE — Progress Notes (Signed)
Subjective:     Roberta Curtis is a 80 y.o. 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 Tasia Catchings - biopsy w/o leukemia cells  #Right UQ pain - worse with laying down - feels the pressure - following with surgery - getting HIDA - biliary sludge - is already taking pepcid daily which helped with acid reflux but the RUQ pain continues  Review of Systems   Social History   Tobacco Use  Smoking Status Former Smoker  . Packs/day: 1.00  . Years: 6.00  . Pack years: 6.00  . Types: Cigarettes  . Start date: 20  . Quit date: 02/20/1970  . Years since quitting: 50.7  Smokeless Tobacco Never Used        Objective:    BP Readings from Last 3 Encounters:  10/22/20 108/66  10/08/20 (!) 164/79  10/07/20 (!) 180/77   Wt Readings from Last 3 Encounters:  10/22/20 137 lb 12 oz (62.5 kg)  10/08/20 140 lb (63.5 kg)  10/07/20 140 lb (63.5 kg)    BP 108/66   Pulse 71   Temp (!) 97.4 F (36.3 C)   Resp 16   Ht '5\' 3"'  (1.6 m)   Wt 137 lb 12 oz (62.5 kg)   SpO2 100%   BMI 24.40 kg/m    Physical Exam Constitutional:      General: She is not in acute distress.    Appearance: She is well-developed. She is not diaphoretic.  HENT:     Right Ear: External ear normal.     Left Ear: External ear normal.  Eyes:     Conjunctiva/sclera: Conjunctivae normal.  Cardiovascular:     Rate and Rhythm: Normal rate.  Pulmonary:     Effort: Pulmonary effort is normal.  Abdominal:     General: Abdomen is flat. Bowel sounds are normal. There is no distension.     Palpations: Abdomen is soft.     Tenderness: There is no guarding or rebound.     Hernia: No hernia is present.     Comments: RUQ/Epigastric area TTP  along muscle medial to lower ribs.   Musculoskeletal:     Cervical back: Neck supple.  Skin:    General: Skin is warm and dry.     Capillary Refill: Capillary refill takes less than 2 seconds.  Neurological:     Mental Status: She is alert. Mental status is at baseline.  Psychiatric:        Mood and Affect: Mood normal.        Behavior: Behavior normal.           Assessment & Plan:   Problem List Items Addressed This Visit      Other   AML (acute myeloid leukemia) in remission [facility]) - Primary    Reviewed pathology from recent bone marrow biopsy with Dr. Tasia Catchings - no leukemia cells. Appreciate hematology support      Chronic bilateral low back pain without sciatica    Following with ortho and significant improvement following injection. Appreciate ortho support.       Urge incontinence    Following with dr. Matilde Sprang and doing  well on myrbetriq. Appreciate urology support      RUQ pain    Following with surgery. [place] with biliary sludge. Exam seems more consistent with MSK. Advised trial of voltaren gel. Cont surgery f/u of HIDA scan. Pending the results and response consider PMR if persisting          Return in about 4 months (around 02/19/2021) for wellness.  Lesleigh Noe, MD  This visit occurred during the SARS-CoV-2 public health emergency.  Safety protocols were in place, including screening questions prior to the visit, additional usage of staff PPE, and extensive cleaning of exam room while observing appropriate contact time as indicated for disinfecting solutions.

## 2020-10-22 NOTE — Assessment & Plan Note (Signed)
Following with ortho and significant improvement following injection. Appreciate ortho support.

## 2020-10-22 NOTE — Assessment & Plan Note (Signed)
Following with dr. Matilde Sprang and doing well on myrbetriq. Appreciate urology support

## 2020-10-22 NOTE — Assessment & Plan Note (Signed)
Reviewed pathology from recent bone marrow biopsy with Dr. Tasia Catchings - no leukemia cells. Appreciate hematology support

## 2020-10-22 NOTE — Telephone Encounter (Signed)
-----  Message from Earlie Server, MD sent at 10/21/2020 10:31 PM EST ----- Please let her know that bone marrow biopsy showed no leukemia cells.  Keep current follow up appt.

## 2020-10-22 NOTE — Telephone Encounter (Signed)
Patient notified.  She asks if the bone marrow bx showed myeloid dysplastic syndrome.

## 2020-10-22 NOTE — Patient Instructions (Signed)
Voltaren Gel - topical anti-inflammatory  - try this over the area of the right abdomen that hurts to see if it helps - can use up to 3 times per day   Glad everything else is better!

## 2020-10-22 NOTE — Assessment & Plan Note (Signed)
Following with surgery. [place] with biliary sludge. Exam seems more consistent with MSK. Advised trial of voltaren gel. Cont surgery f/u of HIDA scan. Pending the results and response consider PMR if persisting

## 2020-10-23 ENCOUNTER — Other Ambulatory Visit: Payer: Self-pay | Admitting: Surgery

## 2020-10-23 DIAGNOSIS — K828 Other specified diseases of gallbladder: Secondary | ICD-10-CM

## 2020-10-23 NOTE — Telephone Encounter (Signed)
Dr. Tasia Catchings would like patient to know that the BM bx report does not mention this diagnose.  But, she will discuss with pathologist and is expecting a return call from pathologist.  Patient is informed.

## 2020-10-30 ENCOUNTER — Encounter (HOSPITAL_COMMUNITY): Payer: Self-pay | Admitting: Oncology

## 2020-11-04 ENCOUNTER — Other Ambulatory Visit (INDEPENDENT_AMBULATORY_CARE_PROVIDER_SITE_OTHER): Payer: Medicare HMO

## 2020-11-04 ENCOUNTER — Other Ambulatory Visit (INDEPENDENT_AMBULATORY_CARE_PROVIDER_SITE_OTHER): Payer: Medicare HMO | Admitting: Family Medicine

## 2020-11-04 ENCOUNTER — Telehealth (INDEPENDENT_AMBULATORY_CARE_PROVIDER_SITE_OTHER): Payer: Medicare HMO | Admitting: Family Medicine

## 2020-11-04 ENCOUNTER — Encounter: Payer: Self-pay | Admitting: Family Medicine

## 2020-11-04 VITALS — Temp 97.7°F | Ht 63.0 in | Wt 139.0 lb

## 2020-11-04 DIAGNOSIS — R519 Headache, unspecified: Secondary | ICD-10-CM

## 2020-11-04 DIAGNOSIS — Z20822 Contact with and (suspected) exposure to covid-19: Secondary | ICD-10-CM

## 2020-11-04 DIAGNOSIS — R110 Nausea: Secondary | ICD-10-CM

## 2020-11-04 DIAGNOSIS — R6883 Chills (without fever): Secondary | ICD-10-CM

## 2020-11-04 LAB — POC INFLUENZA A&B (BINAX/QUICKVUE)
Influenza A, POC: NEGATIVE
Influenza B, POC: NEGATIVE

## 2020-11-04 NOTE — Progress Notes (Signed)
Covid test

## 2020-11-04 NOTE — Progress Notes (Signed)
     Roberta Mcglone T. Giulietta Prokop, MD Primary Care and Sports Medicine [facility] at [facility] [address] [place], [address] Phone: [phone]  FAX: [place] - 80 y.o. female  MRN [id number]  Date of Birth: [birth year]  Visit Date: 11/04/2020  PCP: Lesleigh Noe, MD  Referred by: Lesleigh Noe, MD  Virtual Visit via Telephone Note:  I connected with  Roberta Curtis on 11/04/2020 10:20 AM EST by telephone and verified that I am speaking with the correct person using two identifiers.   Location patient: home phone or cell phone Location provider: work or home office Consent: Verbal consent directly obtained from Roberta Curtis and that there may be a patient responsible charge related to this service. Persons participating in the virtual visit: patient, provider  I discussed the limitations of evaluation and management by telemedicine and the availability of in person appointments.  The patient expressed understanding and agreed to proceed.     Chief Complaint  Patient presents with  . Headache    Exposed to Covid 2 weeks ago at baby shower  . Nausea  . Chills    History of Present Illness:  Last couple of days did have some headaches.  Started from back in the ear, and feels behind her eyebrows.  Will take Reglan with nausea.   Her most recent baby shower was Sat. And she started to feel bad on Sunday.  98.6 temp.  Nausea, chills - all started yesterday. Last night started to be soaking wet. Yesterday did have some diarrhea - normally constipated.   L eyebrow - hurting really  Covid and flu test  Immunization History  Administered Date(s) Administered  . Fluad Quad(high Dose 65+) 05/23/2020  . PFIZER(Purple Top)SARS-COV-2 Vaccination 10/09/2019, 11/05/2019, 06/12/2020  . Pneumococcal-Unspecified 03/01/2019  . Tdap 03/01/2019     Review of Systems: pertinent positives and pertinent negatives as per HPI No acute  distress verbally   Observations/Objective/Exam:  An attempt was made to discern vital signs over the phone and per patient if applicable and possible.   Neurological:     Mental Status: pleasant and appropriate   Psychiatric:        Thought Content: Thought content normal.      Assessment and Plan:    ICD-10-CM   1. Suspected COVID-19 virus infection  Z20.822   2. Chills  R68.83   3. Nonintractable headache, unspecified chronicity pattern, unspecified headache type  R51.9   4. Nausea  R11.0    Total encounter time: 20 minutes. This includes total time spent on the day of encounter.   This includes global chart review, lab review in a 80 year old.  Suspicious for COVID-19.  I am going to check COVID-19 as well as influenza testing today.  Otherwise, continue supportive care.  I discussed the assessment and treatment plan with the patient. The patient was provided an opportunity to ask questions and all were answered. The patient agreed with the plan and demonstrated an understanding of the instructions.   The patient was advised to call back or seek an in-person evaluation if the symptoms worsen or if the condition fails to improve as anticipated.  Follow-up: prn unless noted otherwise below No follow-ups on file.  No orders of the defined types were placed in this encounter.  No orders of the defined types were placed in this encounter.   Signed,  Maud Deed. Arthuro Canelo, MD

## 2020-11-04 NOTE — Progress Notes (Signed)
Appointment scheduled today at 2:30 pm for Covid & Flu test.  Back door information provided.

## 2020-11-05 ENCOUNTER — Ambulatory Visit: Payer: Medicare HMO

## 2020-11-06 ENCOUNTER — Other Ambulatory Visit: Payer: Self-pay | Admitting: Family Medicine

## 2020-11-06 ENCOUNTER — Telehealth: Payer: Self-pay

## 2020-11-06 LAB — NOVEL CORONAVIRUS, NAA: SARS-CoV-2, NAA: NOT DETECTED

## 2020-11-06 LAB — SARS-COV-2, NAA 2 DAY TAT

## 2020-11-06 MED ORDER — DOXYCYCLINE HYCLATE 100 MG PO TABS: 100.0000 mg | ORAL_TABLET | Freq: Two times a day (BID) | ORAL | 0 refills | Status: AC

## 2020-11-06 NOTE — Telephone Encounter (Signed)
Oxybutynin ER 10 mg 30x11

## 2020-11-06 NOTE — Telephone Encounter (Signed)
Patient left a voicemail on the triage line stating that the Myrbetriq is working well for her. But when she went to fill the script at the pharmacy her copay was close to $100 and this is to expensive for her. Is there another medication she can try at a lower cost? Please advise?

## 2020-11-07 MED ORDER — OXYBUTYNIN CHLORIDE ER 10 MG PO TB24: 10.0000 mg | ORAL_TABLET | Freq: Every day | ORAL | 11 refills | Status: DC

## 2020-11-07 NOTE — Telephone Encounter (Signed)
I discussed with pathologist. No myeloid dysplastic changes were seen. Keep current follow up plan.

## 2020-11-07 NOTE — Telephone Encounter (Signed)
Patient notified and script sent 

## 2020-11-08 LAB — SURGICAL PATHOLOGY

## 2020-11-08 NOTE — Telephone Encounter (Signed)
Patient informed. 

## 2020-11-08 NOTE — Telephone Encounter (Signed)
Left message with her husband to return my call.

## 2020-11-18 ENCOUNTER — Encounter: Payer: Self-pay | Admitting: Urology

## 2020-11-18 ENCOUNTER — Other Ambulatory Visit: Payer: Self-pay

## 2020-11-18 ENCOUNTER — Ambulatory Visit (INDEPENDENT_AMBULATORY_CARE_PROVIDER_SITE_OTHER): Payer: Medicare HMO | Admitting: Urology

## 2020-11-18 VITALS — BP 135/74 | HR 84 | Ht 63.0 in | Wt 139.0 lb

## 2020-11-18 DIAGNOSIS — N3946 Mixed incontinence: Secondary | ICD-10-CM

## 2020-11-18 DIAGNOSIS — N3941 Urge incontinence: Secondary | ICD-10-CM

## 2020-11-18 LAB — MICROSCOPIC EXAMINATION

## 2020-11-18 LAB — URINALYSIS, COMPLETE
Bilirubin, UA: NEGATIVE
Glucose, UA: NEGATIVE
Ketones, UA: NEGATIVE
Leukocytes,UA: NEGATIVE
Nitrite, UA: NEGATIVE
Protein,UA: NEGATIVE
Specific Gravity, UA: 1.02 (ref 1.005–1.030)
Urobilinogen, Ur: 0.2 mg/dL (ref 0.2–1.0)
pH, UA: 5.5 (ref 5.0–7.5)

## 2020-11-18 MED ORDER — OXYBUTYNIN CHLORIDE ER 10 MG PO TB24: 10.0000 mg | ORAL_TABLET | Freq: Every day | ORAL | 3 refills | Status: DC

## 2020-11-18 NOTE — Progress Notes (Signed)
11/18/2020 9:59 AM   Roberta Curtis 08-01-1941 [id number]  Referring provider: Lesleigh Noe, MD [place],  [place] [address]  Chief Complaint  Patient presents with  . Cysto    HPI: I was consulted to assess the patient's urinary incontinence worsening over the last year or longer.  She denies stress incontinence.  She has urge incontinence.  She wears 1 pad a day that can be damp or moderately wet.  Sometimes it is higher volume.  She rarely may have a little bit of dampness at night while she sleeping  She voids every 3-4 hours gets up once at night.  Patient primarily has urge incontinence.  I will perform a cystoscopy in approximately 5 or 6 weeks on Myrbetriq 50 mg samples and prescription.  Call if urine culture positive  Today Frequency stable.  Myrbetriq worked well but she switched to oxybutynin due to $100 co-pay.  Last urine culture positive and treated  Dramatic improvement on oxybutynin.  Minimal leakage.  No nocturia.  Frequency much improved.  Clinically not infected  Cystoscopy: Patient underwent flexible cystoscopy.  Bladder mucosa and trigone were normal.  No stitch foreign body or carcinoma.  Tolerated that well.   PMH: Past Medical History:  Diagnosis Date  . 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 GERD (gastroesophageal reflux disease)   . Heart murmur   . Osteopenia     Surgical History: Past Surgical History:  Procedure Laterality Date  . BONE MARROW TRANSPLANT    . BREAST LUMPECTOMY Left   . CATARACT EXTRACTION    . CESAREAN SECTION    . HYSTEROTOMY    . LYMPH GLAND EXCISION    . MENISCUS REPAIR Right   . NECK SURGERY    . SALPINGECTOMY    . WISDOM TOOTH EXTRACTION      Home Medications:  Allergies as of 11/18/2020      Reactions   Jadenu [deferasirox] Other (See Comments)   Elevated Iron, headache and rash   Levofloxacin Other (See Comments)   Ondansetron Hcl Other (See Comments)   Codeine Palpitations    Oxycodone-acetaminophen Palpitations   Penicillins Rash      Medication List       Accurate as of November 18, 2020  9:59 AM. If you have any questions, ask your nurse or doctor.        albuterol 108 (90 Base) MCG/ACT inhaler Commonly known as: Ventolin HFA Inhale 1-2 puffs into the lungs as needed for wheezing or shortness of breath.   BIOTIN PO Take 1,000 mcg by mouth daily.   famotidine 20 MG tablet Commonly known as: PEPCID Take 1 tablet (20 mg total) by mouth 2 (two) times daily.   ibandronate 150 MG tablet Commonly known as: BONIVA Take 1 tablet (150 mg total) by mouth every 30 (thirty) days.   mirabegron ER 50 MG Tb24 tablet Commonly known as: MYRBETRIQ Take 1 tablet (50 mg total) by mouth daily.   omeprazole 20 MG capsule Commonly known as: PRILOSEC Take 1 capsule (20 mg total) by mouth as needed.   oxybutynin 10 MG 24 hr tablet Commonly known as: DITROPAN-XL Take 1 tablet (10 mg total) by mouth daily.   POTASSIUM PO Take 99 mg by mouth daily.   prochlorperazine 10 MG tablet Commonly known as: COMPAZINE Take 1 tablet (10 mg total) by mouth daily.   VITAMIN D PO Take 50,000 mcg by mouth daily.       Allergies:  Allergies  Allergen Reactions  . Jadenu [Deferasirox] Other (See Comments)    Elevated Iron, headache and rash  . Levofloxacin Other (See Comments)  . Ondansetron Hcl Other (See Comments)  . Codeine Palpitations  . Oxycodone-Acetaminophen Palpitations  . Penicillins Rash    Family History: Family History  Problem Relation Age of Onset  . Retinitis pigmentosa Mother   . Stroke Mother   . Liver cancer Father   . Heart disease Father   . Heart disease Brother   . Blindness Brother     Social History:  reports that she quit smoking about 50 years ago. Her smoking use included cigarettes. She started smoking about 92 years ago. She has a 6.00 pack-year smoking history. She has never used smokeless tobacco. She reports previous alcohol  use. She reports that she does not use drugs.  ROS:                                        Physical Exam: BP 135/74   Pulse 84   Ht 5\' 3"  (1.6 m)   Wt 63 kg   BMI 24.62 kg/m   Constitutional:  Alert and oriented, No acute distress. HEENT: [place] AT, moist mucus membranes.  Trachea midline, no masses. Cardiovascular: No clubbing, cyanosis, or edema. Respiratory: Normal respiratory effort, no increased work of breathing. GI: Abdomen is soft, nontender, nondistended, no abdominal masses GU: No CVA tenderness.  Narrow introitus Skin: No rashes, bruises or suspicious lesions. Lymph: No cervical or inguinal adenopathy. Neurologic: Grossly intact, no focal deficits, moving all 4 extremities. Psychiatric: Normal mood and affect.  Laboratory Data: Lab Results  Component Value Date   WBC 6.6 10/08/2020   HGB 11.6 (L) 10/08/2020   HCT 36.2 10/08/2020   MCV 74.0 (L) 10/08/2020   PLT 244 10/08/2020    Lab Results  Component Value Date   CREATININE 0.73 09/04/2020    No results found for: PSA  No results found for: TESTOSTERONE  Lab Results  Component Value Date   HGBA1C 5.8 08/06/2019    Urinalysis    Component Value Date/Time   APPEARANCEUR Cloudy (A) 10/07/2020 1007   GLUCOSEU Negative 10/07/2020 1007   BILIRUBINUR Negative 10/07/2020 1007   PROTEINUR Negative 10/07/2020 1007   NITRITE Positive (A) 10/07/2020 1007   LEUKOCYTESUR 1+ (A) 10/07/2020 1007    Pertinent Imaging:   Assessment & Plan: Prescription renewed and I will see in 1 year  1. Urgency incontinence  - Urinalysis, Complete   No follow-ups on file.  Reece Packer, MD  [facility] [address] [place], [place] [address] [phone]

## 2020-12-03 ENCOUNTER — Ambulatory Visit
Admission: RE | Admit: 2020-12-03 | Discharge: 2020-12-03 | Disposition: A | Discharge status: Home / Self Care | Payer: Medicare HMO | Source: Ambulatory Visit | Attending: Surgery | Admitting: Surgery

## 2020-12-03 ENCOUNTER — Other Ambulatory Visit: Payer: Self-pay

## 2020-12-03 DIAGNOSIS — K828 Other specified diseases of gallbladder: Secondary | ICD-10-CM | POA: Insufficient documentation

## 2020-12-03 IMAGING — NM NM HEPATO W/GB/PHARM/[PERSON_NAME]
2 series · 12 of 12 positions shown · non-contrast
Comparison: None.

CLINICAL DATA: Right upper quadrant pain

EXAM:
NUCLEAR MEDICINE HEPATOBILIARY IMAGING WITH GALLBLADDER EF
TECHNIQUE: Sequential images of the abdomen were obtained [DATE] minutes
following intravenous administration of radiopharmaceutical. After
oral ingestion of Ensure, gallbladder ejection fraction was
determined. At 60 min, normal ejection fraction is greater than 33%.
RADIOPHARMACEUTICALS:  4.853 mCi [4X]  Choletec IV

[Series 1000: hepatobiliary scan · 9.59mm/px · 6 of 60 frames shown]
[frame 6/60]
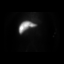
[frame 16/60]
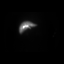
[frame 26/60]
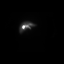
[frame 36/60]
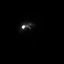
[frame 46/60]
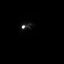
[frame 56/60]
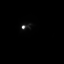

[Series 1000: gallbladder ef · 4.80mm/px · 6 of 120 frames shown]
[frame 11/120]
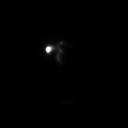
[frame 31/120]
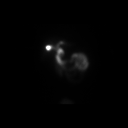
[frame 51/120]
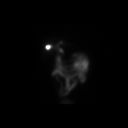
[frame 71/120]
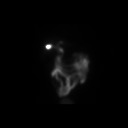
[frame 91/120]
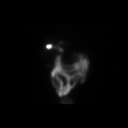
[frame 111/120]
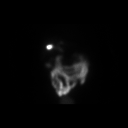

[12 of 12 positions shown; findings below may reference images not displayed]

FINDINGS: Prompt uptake and biliary excretion of activity by the liver is
seen. Gallbladder activity is visualized, consistent with patency of
cystic duct. Biliary activity passes into small bowel, consistent
with patent common bile duct.

Calculated gallbladder ejection fraction is 90%. (Normal gallbladder
ejection fraction with Ensure is greater than 33%.)
IMPRESSION: Normal uptake and excretion of biliary tracer.

Normal gallbladder ejection fraction.

## 2020-12-03 MED ORDER — TECHNETIUM TC 99M MEBROFENIN IV KIT
5.0000 | PACK | Freq: Once | INTRAVENOUS | Status: AC | PRN
  Administered 2020-12-03: 4.853 via INTRAVENOUS

## 2020-12-30 ENCOUNTER — Telehealth: Payer: Self-pay

## 2020-12-30 NOTE — Telephone Encounter (Signed)
It is safe to get this, however, for lower risk old patients like Roberta Curtis she may want to postpone getting until late summer to have longer lasting protection if a new surge arrives.

## 2020-12-30 NOTE — Telephone Encounter (Signed)
Spoke to pt and relayed Dr. Verda Cumins message. Pt is relieved and will follow the advice of Dr. Einar Pheasant.

## 2020-12-30 NOTE — Telephone Encounter (Signed)
Patient called and stated that she and her husband are planning to get the second COVID booster and wanted to know if Dr. Einar Pheasant thinks it is safe for them to do so. Please advise.

## 2021-01-30 ENCOUNTER — Other Ambulatory Visit: Payer: Self-pay | Admitting: Family Medicine

## 2021-01-30 DIAGNOSIS — M8580 Other specified disorders of bone density and structure, unspecified site: Secondary | ICD-10-CM

## 2021-01-30 NOTE — Telephone Encounter (Signed)
Last refill: 08/13/20 #3 with 1   Dexa scan ordered but never had it done.   Coming on 02/18/21 for medicare wellness

## 2021-02-18 ENCOUNTER — Ambulatory Visit (INDEPENDENT_AMBULATORY_CARE_PROVIDER_SITE_OTHER): Payer: Medicare HMO

## 2021-02-18 ENCOUNTER — Other Ambulatory Visit: Payer: Self-pay

## 2021-02-18 ENCOUNTER — Encounter: Payer: Self-pay | Admitting: Family Medicine

## 2021-02-18 ENCOUNTER — Ambulatory Visit (INDEPENDENT_AMBULATORY_CARE_PROVIDER_SITE_OTHER): Payer: Medicare HMO | Admitting: Family Medicine

## 2021-02-18 ENCOUNTER — Encounter: Payer: Medicare HMO | Admitting: Family Medicine

## 2021-02-18 VITALS — BP 118/60 | HR 67 | Temp 97.9°F | Ht 61.5 in | Wt 139.5 lb

## 2021-02-18 DIAGNOSIS — R7303 Prediabetes: Secondary | ICD-10-CM

## 2021-02-18 DIAGNOSIS — Z0000 Encounter for general adult medical examination without abnormal findings: Secondary | ICD-10-CM | POA: Diagnosis not present

## 2021-02-18 DIAGNOSIS — E782 Mixed hyperlipidemia: Secondary | ICD-10-CM

## 2021-02-18 DIAGNOSIS — Z1159 Encounter for screening for other viral diseases: Secondary | ICD-10-CM

## 2021-02-18 DIAGNOSIS — N182 Chronic kidney disease, stage 2 (mild): Secondary | ICD-10-CM

## 2021-02-18 LAB — HEMOGLOBIN A1C: Hgb A1c MFr Bld: 6.1 % (ref 4.6–6.5)

## 2021-02-18 LAB — COMPREHENSIVE METABOLIC PANEL
ALT: 11 U/L (ref 0–35)
AST: 17 U/L (ref 0–37)
Albumin: 4.2 g/dL (ref 3.5–5.2)
Alkaline Phosphatase: 58 U/L (ref 39–117)
BUN: 13 mg/dL (ref 6–23)
CO2: 27 mEq/L (ref 19–32)
Calcium: 9.2 mg/dL (ref 8.4–10.5)
Chloride: 108 mEq/L (ref 96–112)
Creatinine, Ser: 0.84 mg/dL (ref 0.40–1.20)
GFR: 66.31 mL/min (ref >60.00)
Glucose, Bld: 95 mg/dL (ref 70–99)
Potassium: 4.1 mEq/L (ref 3.5–5.1)
Sodium: 142 mEq/L (ref 135–145)
Total Bilirubin: 0.7 mg/dL (ref 0.2–1.2)
Total Protein: 7.1 g/dL (ref 6.0–8.3)

## 2021-02-18 LAB — LIPID PANEL
Cholesterol: 155 mg/dL (ref 0–200)
HDL: 51 mg/dL (ref >39.00)
LDL Cholesterol: 91 mg/dL (ref 0–99)
NonHDL: 103.99
Total CHOL/HDL Ratio: 3
Triglycerides: 64 mg/dL (ref 0.0–149.0)
VLDL: 12.8 mg/dL (ref 0.0–40.0)

## 2021-02-18 NOTE — Progress Notes (Signed)
PCP notes:  Health Maintenance: Prevnar- due Shingrix- due Mammogram- due Dexa- due    Abnormal Screenings: none   Patient concerns: Pain under right rib cage    Nurse concerns: none   Next PCP appt.:  02/18/2021 @ 9:40 am

## 2021-02-18 NOTE — Progress Notes (Signed)
Subjective:   Roberta Curtis is a 80 y.o. female who presents for Medicare Annual (Subsequent) preventive examination.  Review of Systems: N/A      I connected with the patient today by telephone and verified that I am speaking with the correct person using two identifiers. Location patient: home Location nurse: work Persons participating in the telephone visit: patient, nurse.   I discussed the limitations, risks, security and privacy concerns of performing an evaluation and management service by telephone and the availability of in person appointments. I also discussed with the patient that there may be a patient responsible charge related to this service. The patient expressed understanding and verbally consented to this telephonic visit.        Cardiac Risk Factors include: advanced age (>7men, >38 women)     Objective:    Today's Vitals   There is no height or weight on file to calculate BMI.  Advanced Directives 02/18/2021 10/08/2020 09/30/2020 02/21/2020  Does Patient Have a Medical Advance Directive? Yes - Yes Yes  Type of Paramedic of [place];Living will [place];Living will Living will;Healthcare Power of Attorney Living will;Healthcare Power of [place] in Chart? No - copy requested No - copy requested - -    Current Medications (verified) Outpatient Encounter Medications as of 02/18/2021  Medication Sig   albuterol (VENTOLIN HFA) 108 (90 Base) MCG/ACT inhaler Inhale 1-2 puffs into the lungs as needed for wheezing or shortness of breath.   BIOTIN PO Take 1,000 mcg by mouth daily.    famotidine (PEPCID) 20 MG tablet Take 1 tablet (20 mg total) by mouth 2 (two) times daily.   ibandronate (BONIVA) 150 MG tablet TAKE 1 TABLET (150 MG TOTAL) BY MOUTH EVERY 30 (THIRTY) DAYS.   mirabegron ER (MYRBETRIQ) 50 MG TB24 tablet Take 1 tablet (50 mg total) by mouth daily.   omeprazole (PRILOSEC) 20  MG capsule Take 1 capsule (20 mg total) by mouth as needed.   oxybutynin (DITROPAN-XL) 10 MG 24 hr tablet Take 1 tablet (10 mg total) by mouth daily.   POTASSIUM PO Take 99 mg by mouth daily.    prochlorperazine (COMPAZINE) 10 MG tablet Take 1 tablet (10 mg total) by mouth daily.   VITAMIN D PO Take 50,000 mcg by mouth daily.    No facility-administered encounter medications on file as of 02/18/2021.    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[place])    GERD (gastroesophageal reflux disease)    Heart murmur    Osteopenia    Past Surgical History:  Procedure Laterality Date   BONE MARROW TRANSPLANT     BREAST LUMPECTOMY Left    CATARACT EXTRACTION     CESAREAN SECTION     HYSTEROTOMY     LYMPH GLAND EXCISION     MENISCUS REPAIR Right    NECK SURGERY     SALPINGECTOMY     WISDOM TOOTH EXTRACTION     Family History  Problem Relation Age of Onset   Retinitis pigmentosa Mother    Stroke Mother    Liver cancer Father    Heart disease Father    Heart disease Brother    Blindness Brother    Social History   Socioeconomic History   Marital status: Widowed    Spouse name: Alvis Lemmings   Number of children:  3   Years of education: high school   Highest education level: Not on file  Occupational History   Not on file  Tobacco Use   Smoking status: Former    Packs/day: 1.00    Years: 6.00    Pack years: 6.00    Types: Cigarettes    Quit date: 02/20/1970    Years since quitting: 51.0   Smokeless tobacco: Never  Vaping Use   Vaping Use: Never used  Substance and Sexual Activity   Alcohol use: Not Currently   Drug use: Never   Sexual activity: Not Currently    Partners: Male    Birth control/protection: Post-menopausal  Other Topics Concern   Not on file  Social History Narrative      Right handed    Lives in a condo with husband   From [place]      02/12/20   From: [place], moved [place] be near [place]: with Alvis Lemmings - husband   Health Care Proxy: Teresita Madura (daughter)   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      Family: Product/process development scientist, lives in [place]), [place] ([place], [place]), [place] ([place])      Enjoys: meet new people, socialable      Exercise: not currently - cleaning   Diet: picky eater - eats veggies - but her husband doesn't like these things      Safety   Seat belts: Yes    Guns: Yes  and secure   Safe in relationships: Yes    Social Determinants of Health   Financial Resource Strain: Low Risk    Difficulty of Paying Living Expenses: Not hard at all  Food Insecurity: No Food Insecurity   Worried About Charity fundraiser in the Last Year: Never true   [place] in the Last Year: Never true  Transportation Needs: No Transportation Needs   Lack of Transportation (Medical): No   Lack of Transportation (Non-Medical): No  Physical Activity: Inactive   Days of Exercise per Week: 0 days   Minutes of Exercise per Session: 0 min  Stress: No Stress Concern Present   Feeling of Stress : Not at all  Social Connections: Not on file    Tobacco Counseling Counseling given: Not Answered   Clinical Intake:  Pre-visit preparation completed: Yes  Pain : No/denies pain     Nutritional Risks: None Diabetes: No  How often do you need to have someone help you when you read instructions, pamphlets, or other written materials from your doctor or pharmacy?: 1 - Never What is the last grade level you completed in school?: 12th  Diabetic: No Nutrition Risk Assessment:  Has the patient had any N/V/D within the last 2 months?  No  Does the patient have any non-healing wounds?  No  Has the patient had any unintentional weight loss or weight gain?  No   Diabetes:  Is the patient diabetic?  No  If diabetic, was a CBG  obtained today?   N/A Did the patient bring in their glucometer from home?   N/A How often do you monitor your CBG's? N/A.   Financial Strains and Diabetes Management:  Are you having any financial strains with the device, your supplies or your medication?  N/A .  Does the patient want to be seen by Chronic Care Management for management of their diabetes?   N/A Would the patient like to be referred to a Nutritionist or  for Diabetic Management?   N/A Interpreter Needed?: No  Information entered by :: CJohnson, LPN   Activities of Daily Living In your present state of health, do you have any difficulty performing the following activities: 02/18/2021 10/08/2020  Hearing? 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  Vision? N N  Difficulty concentrating or making decisions? N N  Walking or climbing stairs? N N  Dressing or bathing? N N  Doing errands, shopping? N -  Preparing Food and eating ? N -  Using the Toilet? N -  In the past six months, have you accidently leaked urine? Y -  Comment wears pads -  Do you have problems with loss of bowel control? N -  Managing your Medications? N -  Managing your Finances? N -  Housekeeping or managing your Housekeeping? N -  Some recent data might be hidden    Patient Care Team: Lesleigh Noe, MD as PCP - General (Family Medicine) [facility] (Oncology) [facility] (Cardiology) [facility] Earlie Server, MD as Consulting Physician (Hematology and Oncology)  Indicate any recent [place] you may have received from other than [facility] providers in the past year (date may be approximate).     Assessment:   This is a routine wellness examination for Roberta Curtis.  Hearing/Vision screen Vision Screening - Comments:: Advised patient to get annual eye exams   Dietary issues and exercise activities discussed: Current Exercise Habits: The patient does  not participate in regular exercise at present, Exercise limited by: None identified   Goals Addressed             This Visit's Progress    Patient Stated       02/18/2021, I will maintain and continue medications as prescribed.         Depression Screen PHQ 2/9 Scores 02/18/2021 09/05/2020 08/13/2020  PHQ - 2 Score 0 0 0  PHQ- 9 Score 0 - -    Fall Risk Fall Risk  02/18/2021 09/05/2020 08/13/2020  Falls in the past year? 0 0 0  Number falls in past yr: 0 0 0  Injury with Fall? 0 0 -  Risk for fall due to : Medication side effect - -  Follow up Falls evaluation completed;Falls prevention discussed Falls evaluation completed -    FALL RISK PREVENTION PERTAINING TO THE HOME:  Any stairs in or around the home? Yes  If so, are there any without handrails? No  Home free of loose throw rugs in walkways, pet beds, electrical cords, etc? Yes  Adequate lighting in your home to reduce risk of falls? Yes   ASSISTIVE DEVICES UTILIZED TO PREVENT FALLS:  Life alert? No  Use of a cane, walker or w/c? No  Grab bars in the bathroom? No  Shower chair or bench in shower? No  Elevated toilet seat or a handicapped toilet? No   TIMED UP AND GO:  Was the test performed?  N/A telephone visit .   Cognitive Function: MMSE - Mini Mental State Exam 02/18/2021  Orientation to time 5  Orientation to Place 5  Registration 3  Attention/ Calculation 5  Recall 3  Language- repeat 1       Mini Cog  Mini-Cog screen was completed. Maximum score is 22. A value of 0 denotes this part of the MMSE was not completed or the patient failed this part of the Mini-Cog screening.  Immunizations Immunization History  Administered Date(s) Administered   Fluad Quad(high Dose 65+) 05/23/2020   PFIZER(Purple Top)SARS-COV-2 Vaccination 10/09/2019, 11/05/2019, 06/12/2020   Pneumococcal-Unspecified 03/01/2019   Tdap 03/01/2019    TDAP status: Up to date  Flu Vaccine status: Up to date  Pneumococcal  vaccine status: Due, Education has been provided regarding the importance of this vaccine. Advised may receive this vaccine at local pharmacy or Health Dept. Aware to provide a copy of the vaccination record if obtained from local pharmacy or Health Dept. Verbalized acceptance and understanding.  Covid-19 vaccine status: Completed 3 vaccines, will get second booster later in the year  Qualifies for Shingles Vaccine? Yes   Zostavax completed No   Shingrix Completed?: No.    Education has been provided regarding the importance of this vaccine. Patient has been advised to call insurance company to determine out of pocket expense if they have not yet received this vaccine. Advised may also receive vaccine at local pharmacy or Health Dept. Verbalized acceptance and understanding.  Screening Tests Health Maintenance  Topic Date Due   Hepatitis C Screening  Never done   Zoster Vaccines- Shingrix (1 of 2) Never done   PNA vac Low Risk Adult (2 of 2 - PCV13) 02/29/2020   COVID-19 Vaccine (4 - Booster for Pfizer series) 09/12/2020   INFLUENZA VACCINE  04/07/2021   TETANUS/TDAP  02/28/2029   DEXA SCAN  Completed   HPV VACCINES  Aged Out    Health Maintenance  Health Maintenance Due  Topic Date Due   Hepatitis C Screening  Never done   Zoster Vaccines- Shingrix (1 of 2) Never done   PNA vac Low Risk Adult (2 of 2 - PCV13) 02/29/2020   COVID-19 Vaccine (4 - Booster for Pfizer series) 09/12/2020    Colorectal cancer screening: No longer required.   Mammogram status: due, will discuss with provider today  Bone Density status: due, will discuss with provider today  Lung Cancer Screening: (Low Dose CT Chest recommended if Age 23-80 years, 30 pack-year currently smoking OR have quit w/in 15 years.) does not qualify.   Additional Screening:  Hepatitis C Screening: does qualify; Completed due  Vision Screening: Recommended annual ophthalmology exams for early detection of glaucoma and other  disorders of the eye. Is the patient up to date with their annual eye exam?  No Who is the provider or what is the name of the office in which the patient attends annual eye exams? Patient has not seen eye doctor because insurance does not cover it. She states her vision is fine. If pt is not established with a provider, would they like to be referred to a provider to establish care? No .   Dental Screening: Recommended annual dental exams for proper oral hygiene  Community Resource Referral / Chronic Care Management: CRR required this visit?  No   CCM required this visit?  No      Plan:     I have personally reviewed and noted the following in the patient's chart:   Medical and social history Use of alcohol, tobacco or illicit drugs  Current medications and supplements including opioid prescriptions.  Functional ability and status Nutritional status Physical activity Advanced directives List of other physicians Hospitalizations, surgeries, and ER visits in previous 12 months Vitals Screenings to include cognitive, depression, and falls Referrals and appointments  In addition, I have reviewed and discussed with patient certain preventive protocols, quality metrics, and best practice recommendations. A written personalized care plan for  preventive services as well as general preventive health recommendations were provided to patient.   Due to this being a telephonic visit, the after visit summary with patients personalized plan was offered to patient via office or my-chart. Patient preferred to pick up at office at next visit or via mychart.   Andrez Grime, LPN   04/08/2178

## 2021-02-18 NOTE — Patient Instructions (Signed)
Roberta Curtis , Thank you for taking time to come for your Medicare Wellness Visit. I appreciate your ongoing commitment to your health goals. Please review the following plan we discussed and let me know if I can assist you in the future.   Screening recommendations/referrals: Colonoscopy: no longer required Mammogram: due, will discuss with provider Bone Density: due, will discuss with provider  Recommended yearly ophthalmology/optometry visit for glaucoma screening and checkup Recommended yearly dental visit for hygiene and checkup  Vaccinations: Influenza vaccine: Up to date, completed 05/23/2020, due 04/2021 Pneumococcal vaccine: due, will get at today's visit  Tdap vaccine: Up to date, completed 03/01/2019, due 02/2029 Shingles vaccine: due, check with your insurance regarding coverage if interested    Covid-19:completed 3 vaccines, second booster due   Advanced directives: Please bring a copy of your POA (Power of [place]) and/or Living Will to your next appointment.   Conditions/risks identified: none  Next appointment: Follow up in one year for your annual wellness visit    Preventive Care 65 Years and Older, Female Preventive care refers to lifestyle choices and visits with your health care provider that can promote health and wellness. What does preventive care include? A yearly physical exam. This is also called an annual well check. Dental exams once or twice a year. Routine eye exams. Ask your health care provider how often you should have your eyes checked. Personal lifestyle choices, including: Daily care of your teeth and gums. Regular physical activity. Eating a healthy diet. Avoiding tobacco and drug use. Limiting alcohol use. Practicing safe sex. Taking low-dose aspirin every day. Taking vitamin and mineral supplements as recommended by your health care provider. What happens during an annual well check? The services and screenings done by your health care  provider during your annual well check will depend on your age, overall health, lifestyle risk factors, and family history of disease. Counseling  Your health care provider may ask you questions about your: Alcohol use. Tobacco use. Drug use. Emotional well-being. Home and relationship well-being. Sexual activity. Eating habits. History of falls. Memory and ability to understand (cognition). Work and work Statistician. Reproductive health. Screening  You may have the following tests or measurements: Height, weight, and BMI. Blood pressure. Lipid and cholesterol levels. These may be checked every 5 years, or more frequently if you are over 21 years old. Skin check. Lung cancer screening. You may have this screening every year starting at age 78 if you have a 30-pack-year history of smoking and currently smoke or have quit within the past 15 years. Fecal occult blood test (FOBT) of the stool. You may have this test every year starting at age 41. Flexible sigmoidoscopy or colonoscopy. You may have a sigmoidoscopy every 5 years or a colonoscopy every 10 years starting at age 49. Hepatitis C blood test. Hepatitis B blood test. Sexually transmitted disease (STD) testing. Diabetes screening. This is done by checking your blood sugar (glucose) after you have not eaten for a while (fasting). You may have this done every 1-3 years. Bone density scan. This is done to screen for osteoporosis. You may have this done starting at age 9. Mammogram. This may be done every 1-2 years. Talk to your health care provider about how often you should have regular mammograms. Talk with your health care provider about your test results, treatment options, and if necessary, the need for more tests. Vaccines  Your health care provider may recommend certain vaccines, such as: Influenza vaccine. This is recommended every year.  Tetanus, diphtheria, and acellular pertussis (Tdap, Td) vaccine. You may need a Td  booster every 10 years. Zoster vaccine. You may need this after age 25. Pneumococcal 13-valent conjugate (PCV13) vaccine. One dose is recommended after age 33. Pneumococcal polysaccharide (PPSV23) vaccine. One dose is recommended after age 21. Talk to your health care provider about which screenings and vaccines you need and how often you need them. This information is not intended to replace advice given to you by your health care provider. Make sure you discuss any questions you have with your health care provider. Document Released: 09/20/2015 Document Revised: 05/13/2016 Document Reviewed: 06/25/2015 Elsevier Interactive Patient Education  2017 [place] Prevention in the Home Falls can cause injuries. They can happen to people of all ages. There are many things you can do to make your home safe and to help prevent falls. What can I do on the outside of my home? Regularly fix the edges of walkways and driveways and fix any cracks. Remove anything that might make you trip as you walk through a door, such as a raised step or threshold. Trim any bushes or trees on the path to your home. Use bright outdoor lighting. Clear any walking paths of anything that might make someone trip, such as rocks or tools. Regularly check to see if handrails are loose or broken. Make sure that both sides of any steps have handrails. Any raised decks and porches should have guardrails on the edges. Have any leaves, snow, or ice cleared regularly. Use sand or salt on walking paths during winter. Clean up any spills in your garage right away. This includes oil or grease spills. What can I do in the bathroom? Use night lights. Install grab bars by the toilet and in the tub and shower. Do not use towel bars as grab bars. Use non-skid mats or decals in the tub or shower. If you need to sit down in the shower, use a plastic, non-slip stool. Keep the floor dry. Clean up any water that spills on the floor  as soon as it happens. Remove soap buildup in the tub or shower regularly. Attach bath mats securely with double-sided non-slip rug tape. Do not have throw rugs and other things on the floor that can make you trip. What can I do in the bedroom? Use night lights. Make sure that you have a light by your bed that is easy to reach. Do not use any sheets or blankets that are too big for your bed. They should not hang down onto the floor. Have a firm chair that has side arms. You can use this for support while you get dressed. Do not have throw rugs and other things on the floor that can make you trip. What can I do in the kitchen? Clean up any spills right away. Avoid walking on wet floors. Keep items that you use a lot in easy-to-reach places. If you need to reach something above you, use a strong step stool that has a grab bar. Keep electrical cords out of the way. Do not use floor polish or wax that makes floors slippery. If you must use wax, use non-skid floor wax. Do not have throw rugs and other things on the floor that can make you trip. What can I do with my stairs? Do not leave any items on the stairs. Make sure that there are handrails on both sides of the stairs and use them. Fix handrails that are broken or loose.  Make sure that handrails are as long as the stairways. Check any carpeting to make sure that it is firmly attached to the stairs. Fix any carpet that is loose or worn. Avoid having throw rugs at the top or bottom of the stairs. If you do have throw rugs, attach them to the floor with carpet tape. Make sure that you have a light switch at the top of the stairs and the bottom of the stairs. If you do not have them, ask someone to add them for you. What else can I do to help prevent falls? Wear shoes that: Do not have high heels. Have rubber bottoms. Are comfortable and fit you well. Are closed at the toe. Do not wear sandals. If you use a stepladder: Make sure that it is  fully opened. Do not climb a closed stepladder. Make sure that both sides of the stepladder are locked into place. Ask someone to hold it for you, if possible. Clearly mark and make sure that you can see: Any grab bars or handrails. First and last steps. Where the edge of each step is. Use tools that help you move around (mobility aids) if they are needed. These include: Canes. Walkers. Scooters. Crutches. Turn on the lights when you go into a dark area. Replace any light bulbs as soon as they burn out. Set up your furniture so you have a clear path. Avoid moving your furniture around. If any of your floors are uneven, fix them. If there are any pets around you, be aware of where they are. Review your medicines with your doctor. Some medicines can make you feel dizzy. This can increase your chance of falling. Ask your doctor what other things that you can do to help prevent falls. This information is not intended to replace advice given to you by your health care provider. Make sure you discuss any questions you have with your health care provider. Document Released: 06/20/2009 Document Revised: 01/30/2016 Document Reviewed: 09/28/2014 Elsevier Interactive Patient Education  2017 [employer].

## 2021-02-18 NOTE — Progress Notes (Signed)
Annual Exam   Chief Complaint:  Chief Complaint  Patient presents with   Medicare Wellness    History of Present Illness:  [name] is a 80 y.o. No obstetric history on file. who LMP was No LMP recorded. Patient has had a hysterectomy., presents today for her annual examination.     RUQ and epigastric pain - traveled - advised bruised ribs - worse with laying down - no association w/ food - normal GB work-up   Nutrition She does get adequate calcium and Vitamin D in her diet. 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     The patient feels safe at home and in their relationships: yes.     GYN She is not sexually active.     Breast Cancer Screening (Age 21-74):  There is no FH of breast cancer. There is no FH of ovarian cancer. BRCA screening Not Indicated.  Last Mammogram: 2020 The patient does want a mammogram this year.    Colon Cancer Screening:  Age 25-75 yo - benefits outweigh the risk. Adults 62-85 yo who have never been screened benefit.  Benefits: 134000 people in 2016 will be diagnosed and 49,000 will die - early detection helps [place]: Complications 2/2 to colonoscopy High Risk (Colonoscopy): genetic disorder (Lynch syndrome or familial adenomatous polyposis), personal hx of IBD, previous adenomatous polyp, or previous colorectal cancer, FamHx start 10 years before the age at diagnosis, increased in males and black race  Options:  FIT - looks for hemoglobin (blood in the stool) - specific and fairly sensitive - must be done annually Cologuard - looks for DNA and blood - more sensitive - therefore can have more false positives, every 3 years Colonoscopy - every 10 years if normal - sedation, bowl prep, must have someone drive you  Shared decision making and the patient had decided to do  colonoscopy - 2029.   Social History   Tobacco Use  Smoking Status Former   Packs/day: 1.00   Years: 6.00   Pack years: 6.00   Types: Cigarettes   Quit date: 02/20/1970   Years since quitting: 51.0  Smokeless Tobacco Never    Lung Cancer Screening (Ages 41-63): not applicable   Weight Wt Readings from Last 3 Encounters:  02/18/21 139 lb 8 oz (63.3 kg)  11/18/20 139 lb (63 kg)  11/04/20 139 lb (63 kg)   Patient has normal BMI  BMI Readings from Last 1 Encounters:  02/18/21 25.93 kg/m     Chronic disease screening Blood pressure monitoring:  BP Readings from Last 3 Encounters:  02/18/21 118/60  11/18/20 135/74  10/22/20 108/66    Lipid Monitoring: Indication for screening: age >29, obesity, diabetes, family hx, CV risk factors.  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)    GERD (gastroesophageal reflux disease)    Heart murmur    Osteopenia     Past Surgical History:  Procedure Laterality Date  BONE MARROW TRANSPLANT     BREAST LUMPECTOMY Left    CATARACT EXTRACTION     CESAREAN SECTION     HYSTEROTOMY     LYMPH GLAND EXCISION     MENISCUS REPAIR Right    NECK SURGERY     SALPINGECTOMY     WISDOM TOOTH EXTRACTION      Prior to Admission medications   Medication Sig Start Date End Date Taking? Authorizing Provider  albuterol (VENTOLIN HFA) 108 (90 Base) MCG/ACT inhaler Inhale 1-2 puffs into the lungs as needed for wheezing or shortness of breath. 02/13/20  Yes Lesleigh Noe, MD  BIOTIN PO Take 1,000 mcg by mouth daily.    Yes [provider]  ibandronate (BONIVA) 150 MG  tablet TAKE 1 TABLET (150 MG TOTAL) BY MOUTH EVERY 30 (THIRTY) DAYS. 01/31/21  Yes Lesleigh Noe, MD  mirabegron ER (MYRBETRIQ) 50 MG TB24 tablet Take 1 tablet (50 mg total) by mouth daily. Patient taking differently: Take 50 mg by mouth as needed. 10/07/20  Yes MacDiarmid, Nicki Reaper, MD  omeprazole (PRILOSEC) 20 MG capsule Take 1 capsule (20 mg total) by mouth as needed. 08/13/20  Yes Lesleigh Noe, MD  oxybutynin (DITROPAN-XL) 10 MG 24 hr tablet Take 1 tablet (10 mg total) by mouth daily. 11/18/20  Yes MacDiarmid, Nicki Reaper, MD  POTASSIUM PO Take 99 mg by mouth daily.    Yes [provider]  prochlorperazine (COMPAZINE) 10 MG tablet Take 1 tablet (10 mg total) by mouth daily. 08/13/20  Yes Lesleigh Noe, MD  VITAMIN D PO Take 50,000 mcg by mouth daily.    Yes [provider]    Allergies  Allergen Reactions   Jadenu [Deferasirox] Other (See Comments)    Elevated Iron, headache and rash   Levofloxacin Other (See Comments)   Ondansetron Hcl Other (See Comments)   Codeine Palpitations   Oxycodone-Acetaminophen Palpitations   Penicillins Rash    Gynecologic History: No LMP recorded. Patient has had a hysterectomy.  Obstetric History: No obstetric history on file.  Social History   Socioeconomic History   Marital status: Widowed    Spouse name: Alvis Lemmings   Number of children: 3   Years of education: high school   Highest education level: Not on file  Occupational History   Not on file  Tobacco Use   Smoking status: Former    Packs/day: 1.00    Years: 6.00    Pack years: 6.00    Types: Cigarettes    Quit date: 02/20/1970    Years since quitting: 51.0   Smokeless tobacco: Never  Vaping Use   Vaping Use: Never used  Substance and Sexual Activity   Alcohol use: Not Currently   Drug use: Never   Sexual activity: Not Currently    Partners: Male    Birth control/protection: Post-menopausal  Other Topics Concern   Not on file  Social History Narrative       Right handed   Lives in a condo with husband   From [place]      02/12/20   From: [place], moved [place] be near [place]: with Xenia Proxy: Teresita Madura (daughter)   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      Family: Product/process development scientist, lives in [place]), [place] ([place], [place]), Juanda Crumble ([place])      Enjoys: meet new people, socialable      Exercise: not currently - cleaning  Diet: picky eater - eats veggies - but her husband doesn't like these things      Safety   Seat belts: Yes    Guns: Yes  and secure   Safe in relationships: Yes    Social Determinants of Health   Financial Resource Strain: Low Risk    Difficulty of Paying Living Expenses: Not hard at all  Food Insecurity: No Food Insecurity   Worried About Charity fundraiser in the Last Year: Never true   [place] in the Last Year: Never true  Transportation Needs: No Transportation Needs   Lack of Transportation (Medical): No   Lack of Transportation (Non-Medical): No  Physical Activity: Inactive   Days of Exercise per Week: 0 days   Minutes of Exercise per Session: 0 min  Stress: No Stress Concern Present   Feeling of Stress : Not at all  Social Connections: Not on file  Intimate Partner Violence: Not At Risk   Fear of Current or Ex-Partner: No   Emotionally Abused: No   Physically Abused: No   Sexually Abused: No    Family History  Problem Relation Age of Onset   Retinitis pigmentosa Mother    Stroke Mother    Liver cancer Father    Heart disease Father    Heart disease Brother    Blindness Brother     Review of Systems  Constitutional:  Negative for chills and fever.  HENT:  Negative for congestion and sore throat.   Eyes:  Negative for blurred vision and double vision.  Respiratory:  Negative for shortness of breath.   Cardiovascular:  Negative for chest pain.  Gastrointestinal:  Negative for heartburn, nausea and vomiting.   Genitourinary: Negative.   Musculoskeletal: Negative.  Negative for myalgias.  Skin:  Negative for rash.  Neurological:  Negative for dizziness and headaches.  Endo/Heme/Allergies:  Does not bruise/bleed easily.  Psychiatric/Behavioral:  Negative for depression. The patient is not nervous/anxious.     Physical Exam BP 118/60   Pulse 67   Temp 97.9 F (36.6 C) (Temporal)   Ht 5' 1.5" (1.562 m)   Wt 139 lb 8 oz (63.3 kg)   SpO2 97%   BMI 25.93 kg/m    BP Readings from Last 3 Encounters:  02/18/21 118/60  11/18/20 135/74  10/22/20 108/66      Physical Exam Constitutional:      General: She is not in acute distress.    Appearance: She is well-developed. She is not diaphoretic.  HENT:     Head: Normocephalic and atraumatic.     Right Ear: External ear normal.     Left Ear: External ear normal.     Nose: Nose normal.  Eyes:     General: No scleral icterus.    Conjunctiva/sclera: Conjunctivae normal.  Cardiovascular:     Rate and Rhythm: Normal rate and regular rhythm.     Heart sounds: No murmur heard. Pulmonary:     Effort: Pulmonary effort is normal. No respiratory distress.     Breath sounds: Normal breath sounds. No wheezing.  Abdominal:     General: Bowel sounds are normal. There is no distension.     Palpations: Abdomen is soft. There is no mass.     Tenderness: There is no abdominal tenderness. There is no guarding or rebound.  Musculoskeletal:        General: Normal range of motion.     Cervical back: Neck supple.  Lymphadenopathy:  Cervical: No cervical adenopathy.  Skin:    General: Skin is warm and dry.     Capillary Refill: Capillary refill takes less than 2 seconds.  Neurological:     Mental Status: She is alert and oriented to person, place, and time.     Deep Tendon Reflexes: Reflexes normal.  Psychiatric:        Behavior: Behavior normal.    Results:  PHQ-9:  Flowsheet Row Clinical Support from 02/18/2021 in [place] at [facility]  PHQ-9 Total Score 0         Assessment: 80 y.o. No obstetric history on file. female here for routine annual physical examination.  Plan: Problem List Items Addressed This Visit       Genitourinary   CKD (chronic kidney disease) stage 2, GFR 60-89 ml/min   Relevant Orders   Comprehensive metabolic panel     Other   Prediabetes   Relevant Orders   Hemoglobin A1c   Other Visit Diagnoses     Annual physical exam    -  Primary   Mixed hyperlipidemia       Relevant Orders   Lipid panel   Need for hepatitis C screening test       Relevant Orders   Hepatitis C antibody       Screening: -- Blood pressure screen normal -- cholesterol screening: will obtain -- Weight screening: normal -- Diabetes Screening: will obtain -- Nutrition: Encouraged healthy diet  The ASCVD Risk score Mikey Bussing DC Jr., et al., 2013) failed to calculate for the following reasons:   Cannot find a previous HDL lab   Cannot find a previous total cholesterol lab  -- Statin therapy for Age 79-75 with CVD risk >7.5%  Psych -- Depression screening (PHQ-9):  Flowsheet Row Clinical Support from 02/18/2021 in [place] at [facility]  PHQ-9 Total Score 0        Safety -- tobacco screening: not using -- alcohol screening:  low-risk usage. -- no evidence of domestic violence or intimate partner violence.   Cancer Screening -- pap smear not collected per ASCCP guidelines -- family history of breast cancer screening: done. not at high risk. -- Mammogram - ordered -- Colon cancer (age 100+)--  up to date  Immunizations Immunization History  Administered Date(s) Administered   Fluad Quad(high Dose 65+) 05/23/2020   PFIZER(Purple Top)SARS-COV-2 Vaccination 10/09/2019, 11/05/2019, 06/12/2020   Pneumococcal-Unspecified 03/01/2019   Tdap 03/01/2019    -- flu vaccine up to date -- TDAP q10 years up to date -- Shingles (age >13) pt will get with pharmacy -- PPSV-23 (19-64 with  chronic disease or smoking) unknown, record requested -- PCV-13 (age >35) - one dose followed by PPSV-23 1 year later unknown, record requested -- Covid-19 Vaccine up to date   Encouraged healthy diet and exercise. Encouraged regular vision and dental care.    Lesleigh Noe, MD

## 2021-02-18 NOTE — Patient Instructions (Addendum)
Please call the location of your choice from the menu below to schedule your Mammogram and/or Bone Density appointment.    [place] at [facility]   Phone:  [phone]   [place], [place] [address]                                            Services: 3D Mammogram and Bone Density   Rib pain - use topical voltaren gel 1-2 times daily - if no improvement call and will plan for X-ray

## 2021-02-19 ENCOUNTER — Telehealth: Payer: Self-pay

## 2021-02-19 LAB — HEPATITIS C ANTIBODY
Hepatitis C Ab: NONREACTIVE
SIGNAL TO CUT-OFF: 0.02 (ref <1.00)

## 2021-02-19 NOTE — Telephone Encounter (Signed)
Pt said she got lab results and after pt talking with her daughter who is a nurse pt is nervous about being told she is prediabetic. Pt request cb with lab values for blood sugar and A1c.

## 2021-02-20 NOTE — Telephone Encounter (Signed)
Tried to call [name]. No answer.

## 2021-02-21 NOTE — Telephone Encounter (Signed)
Spoke to pt and relayed her 2 lab results; A1C and glucose, that she requested. Pt also requested a copy of lab results mailed to her. Will prepare results for mail today.

## 2021-02-28 ENCOUNTER — Other Ambulatory Visit: Payer: Self-pay | Admitting: Family Medicine

## 2021-02-28 DIAGNOSIS — Z1231 Encounter for screening mammogram for malignant neoplasm of breast: Secondary | ICD-10-CM

## 2021-03-12 ENCOUNTER — Ambulatory Visit
Admission: RE | Admit: 2021-03-12 | Discharge: 2021-03-12 | Disposition: A | Discharge status: Home / Self Care | Payer: Medicare HMO | Source: Ambulatory Visit | Attending: Family Medicine | Admitting: Family Medicine

## 2021-03-12 ENCOUNTER — Other Ambulatory Visit: Payer: Self-pay

## 2021-03-12 DIAGNOSIS — Z13820 Encounter for screening for osteoporosis: Secondary | ICD-10-CM | POA: Insufficient documentation

## 2021-03-12 DIAGNOSIS — Z1231 Encounter for screening mammogram for malignant neoplasm of breast: Secondary | ICD-10-CM | POA: Insufficient documentation

## 2021-03-12 DIAGNOSIS — Z780 Asymptomatic menopausal state: Secondary | ICD-10-CM | POA: Diagnosis not present

## 2021-03-12 DIAGNOSIS — M8580 Other specified disorders of bone density and structure, unspecified site: Secondary | ICD-10-CM

## 2021-03-12 DIAGNOSIS — M8589 Other specified disorders of bone density and structure, multiple sites: Secondary | ICD-10-CM | POA: Diagnosis not present

## 2021-03-12 IMAGING — MG MM DIGITAL SCREENING BILAT W/ TOMO AND CAD
6 of 10 series · 6 of 30 positions shown · non-contrast
Comparison: Previous exam(s).

CLINICAL DATA: Screening.

EXAM:
DIGITAL SCREENING BILATERAL MAMMOGRAM WITH TOMOSYNTHESIS AND CAD
TECHNIQUE: Bilateral screening digital craniocaudal and mediolateral oblique
mammograms were obtained. Bilateral screening digital breast
tomosynthesis was performed. The images were evaluated with
computer-aided detection.

[R CC synth-2D (1 of 2)]
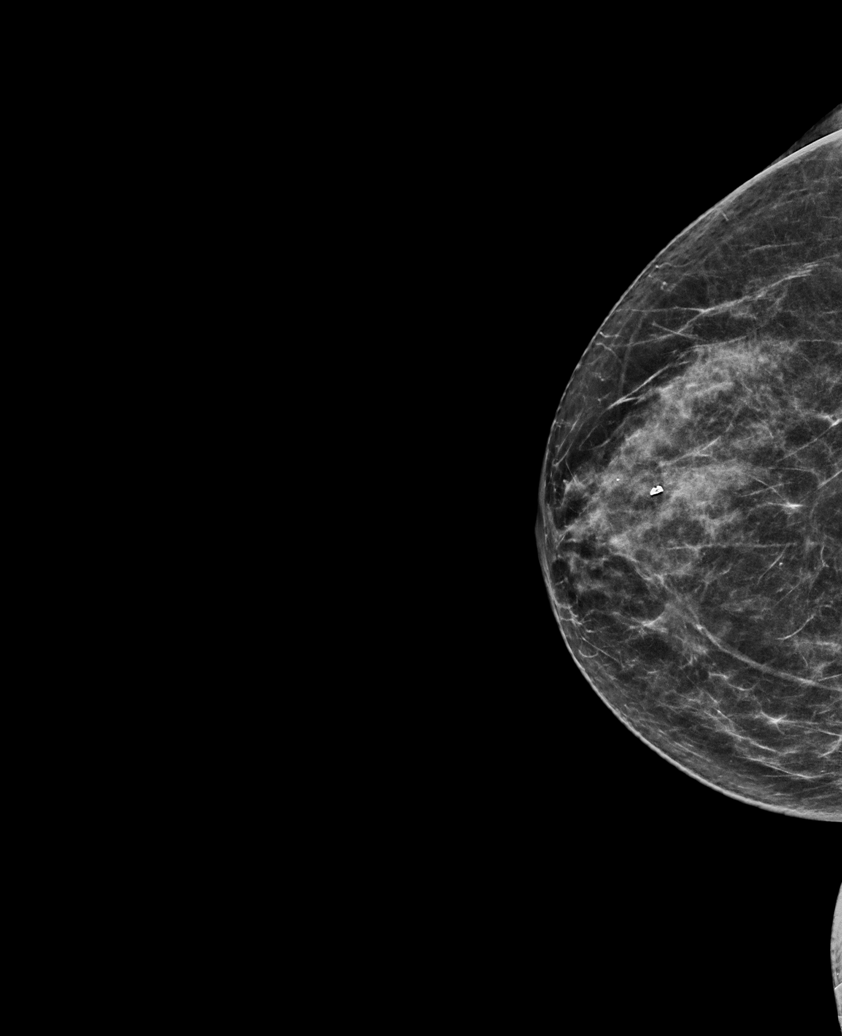

[R MLO synth-2D]
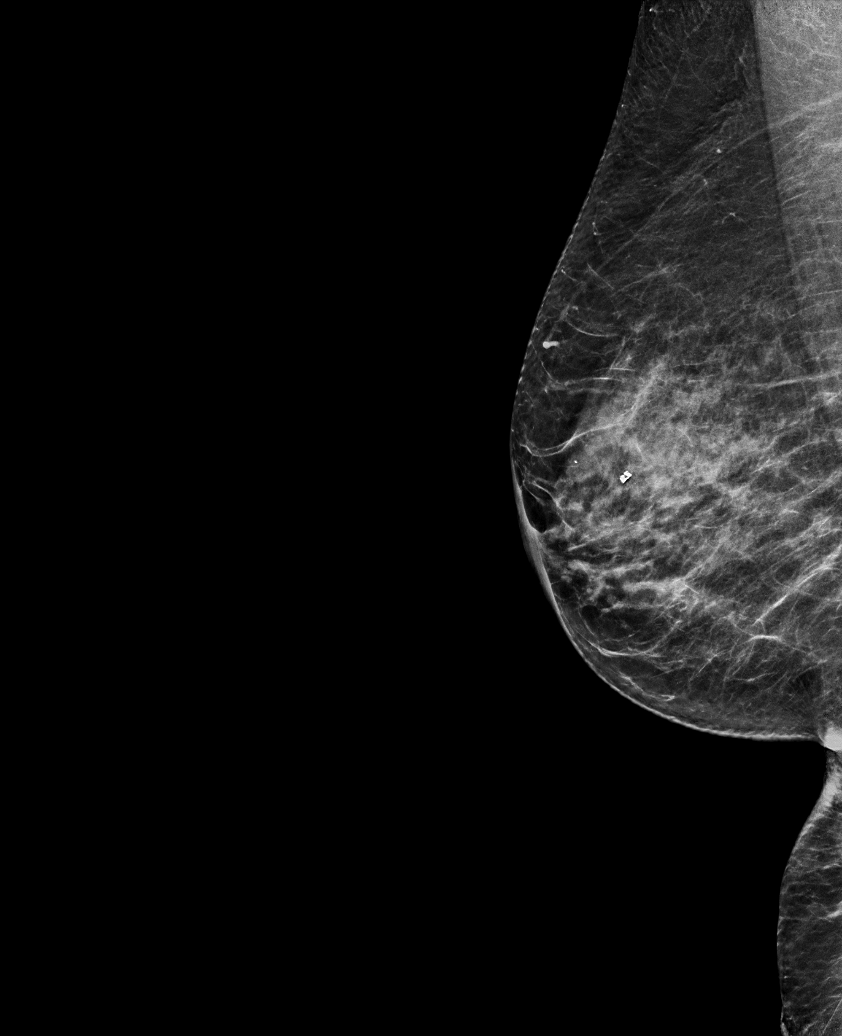

[L CC synth-2D]
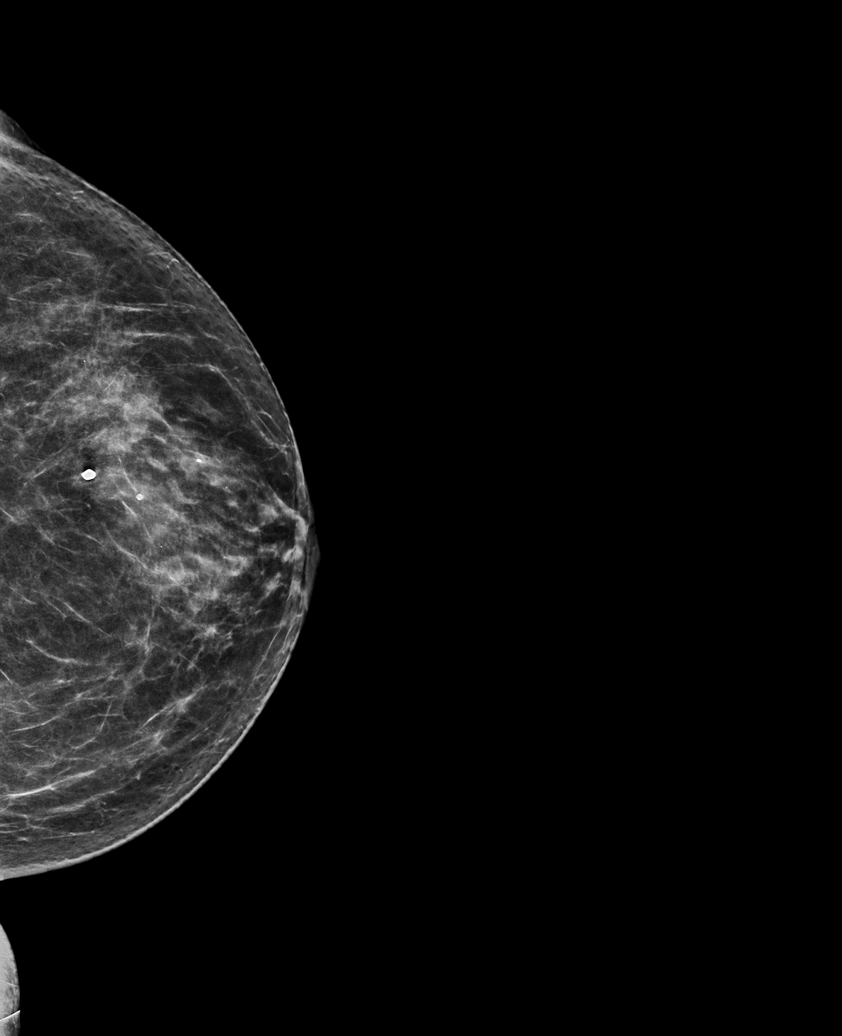

[R CC synth-2D (2 of 2)]
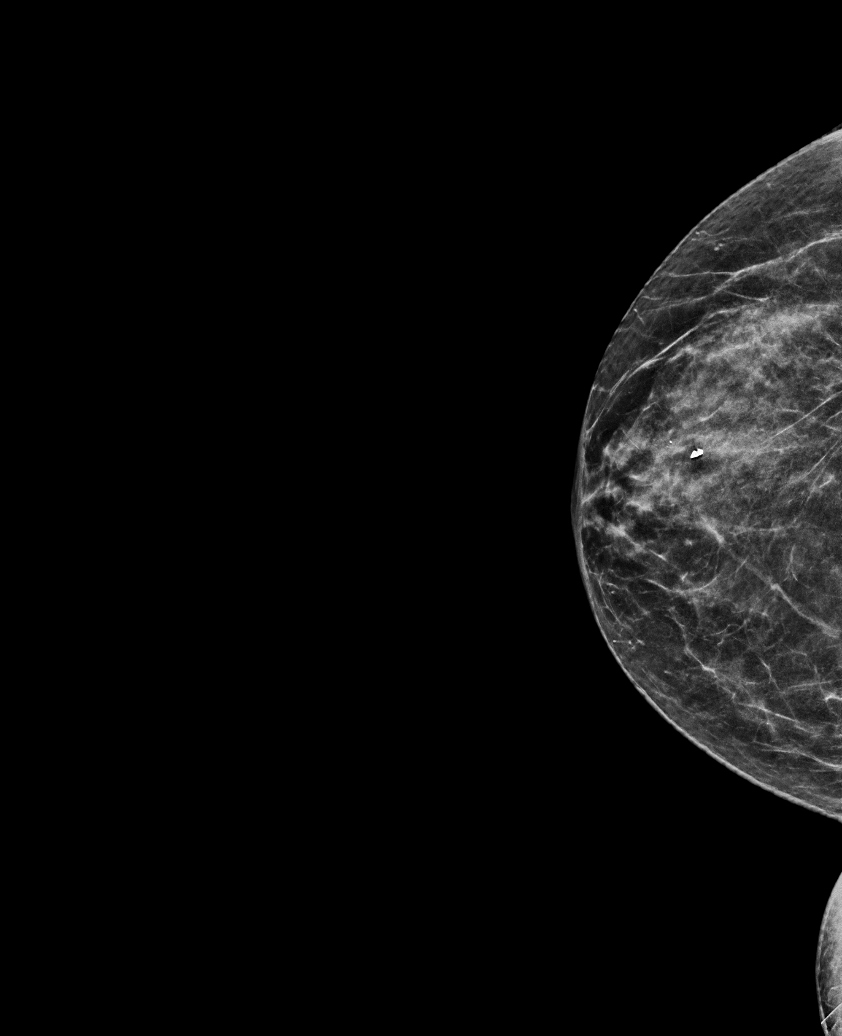

[L MLO synth-2D]
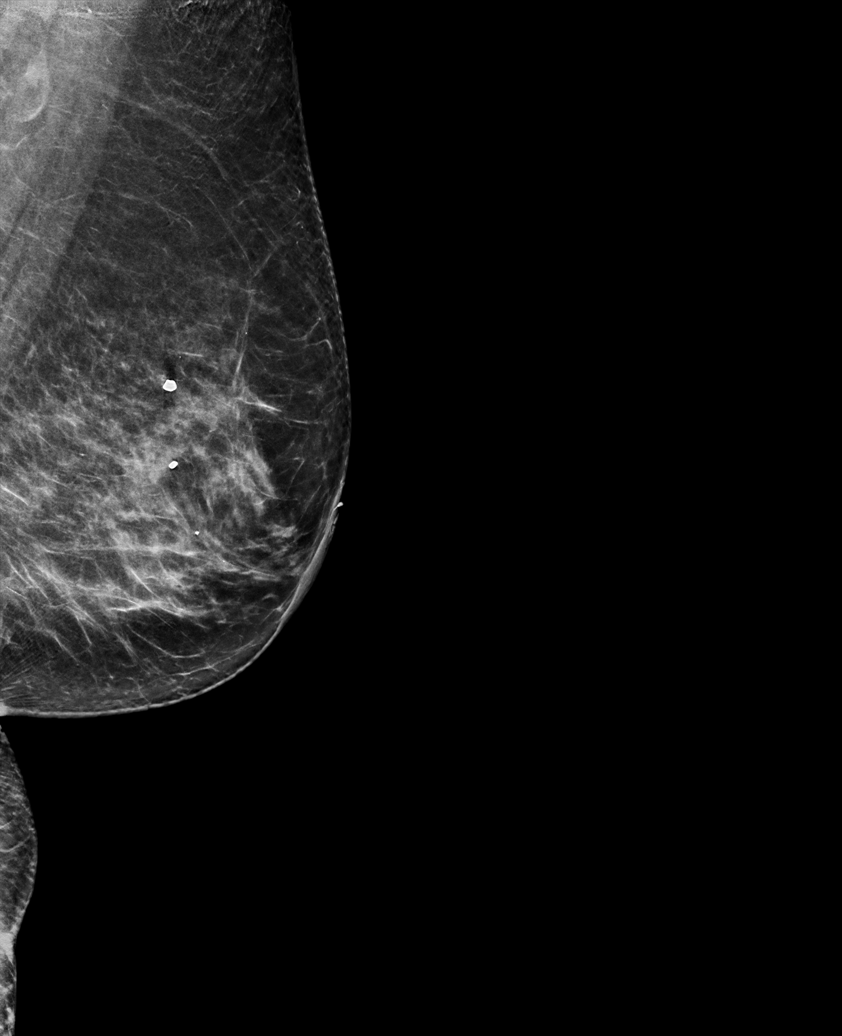

[L CC tomo · tomo slice 32/63.0]
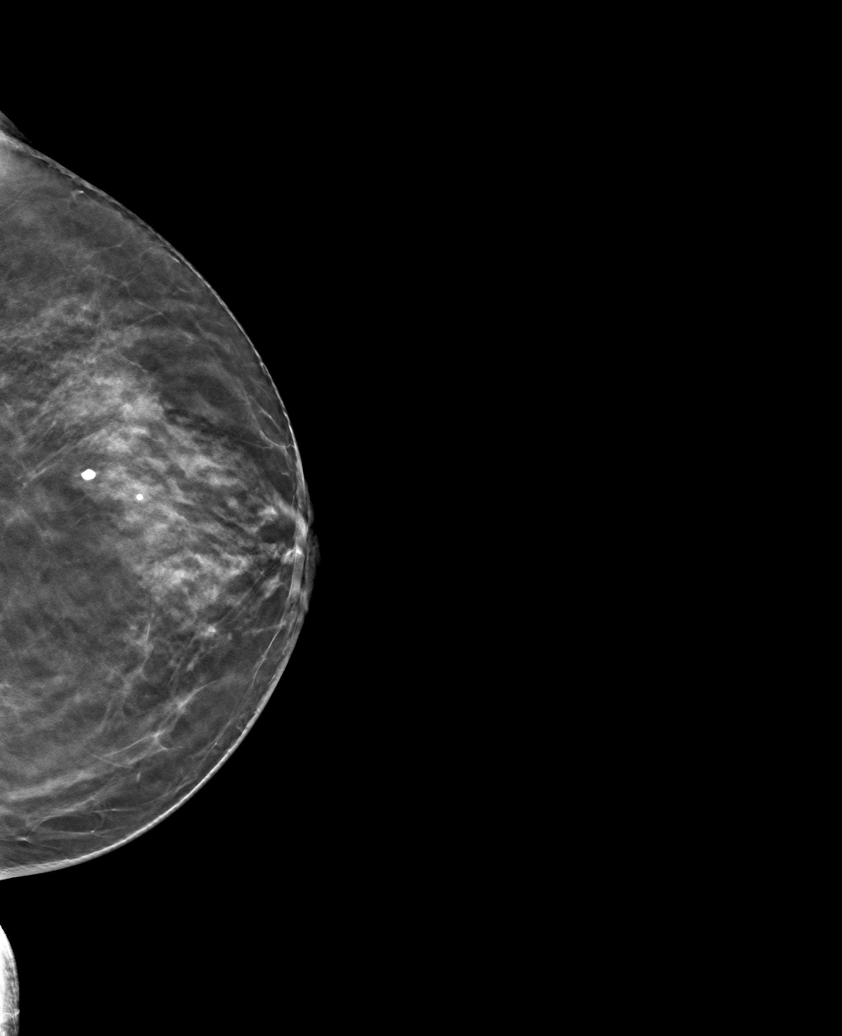

[6 of 30 positions shown; findings below may reference images not displayed]

ACR Breast Density Category c: The breast tissue is heterogeneously
dense, which may obscure small masses.
FINDINGS: There are no findings suspicious for malignancy.
IMPRESSION: No mammographic evidence of malignancy. A result letter of this
screening mammogram will be mailed directly to the patient.

RECOMMENDATION:
Screening mammogram in one year. (Code:[V2])

BI-RADS CATEGORY  1: Negative.

## 2021-03-13 ENCOUNTER — Telehealth: Payer: Self-pay

## 2021-03-13 NOTE — Telephone Encounter (Signed)
Patient is returning a call about imaging results, asked if someone can give her a call within the next 15-20 minutes, if not to give her a call after 1pm.

## 2021-03-14 NOTE — Telephone Encounter (Signed)
Spoke to pt and relayed dexa scan results per Dr. Einar Pheasant.

## 2021-03-24 ENCOUNTER — Telehealth: Payer: Self-pay | Admitting: Oncology

## 2021-03-24 NOTE — Telephone Encounter (Signed)
Patient called to verify if the labs drawn by Dr. Einar Pheasant on 6/14 will suffice because she is unsure if her insurance will pay for labwork again.  Routing to clinical team for follow up.

## 2021-04-01 ENCOUNTER — Inpatient Hospital Stay: Payer: Medicare HMO | Attending: Oncology

## 2021-04-01 DIAGNOSIS — Z9481 Bone marrow transplant status: Secondary | ICD-10-CM | POA: Diagnosis not present

## 2021-04-01 DIAGNOSIS — D509 Iron deficiency anemia, unspecified: Secondary | ICD-10-CM | POA: Diagnosis not present

## 2021-04-01 DIAGNOSIS — Z856 Personal history of leukemia: Secondary | ICD-10-CM | POA: Insufficient documentation

## 2021-04-01 DIAGNOSIS — N649 Disorder of breast, unspecified: Secondary | ICD-10-CM | POA: Diagnosis not present

## 2021-04-01 LAB — COMPREHENSIVE METABOLIC PANEL
ALT: 17 U/L (ref 0–44)
AST: 25 U/L (ref 15–41)
Albumin: 4.1 g/dL (ref 3.5–5.0)
Alkaline Phosphatase: 51 U/L (ref 38–126)
Anion gap: 9 (ref 5–15)
BUN: 16 mg/dL (ref 8–23)
CO2: 23 mmol/L (ref 22–32)
Calcium: 9.2 mg/dL (ref 8.9–10.3)
Chloride: 106 mmol/L (ref 98–111)
Creatinine, Ser: 0.92 mg/dL (ref 0.44–1.00)
GFR, Estimated: >60 mL/min (ref >60)
Glucose, Bld: 97 mg/dL (ref 70–99)
Potassium: 4 mmol/L (ref 3.5–5.1)
Sodium: 138 mmol/L (ref 135–145)
Total Bilirubin: 0.6 mg/dL (ref 0.3–1.2)
Total Protein: 7 g/dL (ref 6.5–8.1)

## 2021-04-01 LAB — CBC WITH DIFFERENTIAL/PLATELET
Abs Immature Granulocytes: 0.02 10*3/uL (ref 0.00–0.07)
Basophils Absolute: 0 10*3/uL (ref 0.0–0.1)
Basophils Relative: 0 %
Eosinophils Absolute: 0.1 10*3/uL (ref 0.0–0.5)
Eosinophils Relative: 1 %
HCT: 34.9 % — ABNORMAL LOW (ref 36.0–46.0)
Hemoglobin: 11.2 g/dL — ABNORMAL LOW (ref 12.0–15.0)
Immature Granulocytes: 0 %
Lymphocytes Relative: 33 %
Lymphs Abs: 2.9 10*3/uL (ref 0.7–4.0)
MCH: 24.2 pg — ABNORMAL LOW (ref 26.0–34.0)
MCHC: 32.1 g/dL (ref 30.0–36.0)
MCV: 75.5 fL — ABNORMAL LOW (ref 80.0–100.0)
Monocytes Absolute: 0.6 10*3/uL (ref 0.1–1.0)
Monocytes Relative: 7 %
Neutro Abs: 5 10*3/uL (ref 1.7–7.7)
Neutrophils Relative %: 59 %
Platelets: 204 10*3/uL (ref 150–400)
RBC: 4.62 MIL/uL (ref 3.87–5.11)
RDW: 15.1 % (ref 11.5–15.5)
WBC: 8.6 10*3/uL (ref 4.0–10.5)
nRBC: 0 % (ref 0.0–0.2)

## 2021-04-04 ENCOUNTER — Other Ambulatory Visit: Payer: Self-pay

## 2021-04-04 ENCOUNTER — Inpatient Hospital Stay (HOSPITAL_BASED_OUTPATIENT_CLINIC_OR_DEPARTMENT_OTHER): Payer: Medicare HMO | Admitting: Oncology

## 2021-04-04 ENCOUNTER — Telehealth: Payer: Self-pay

## 2021-04-04 ENCOUNTER — Other Ambulatory Visit: Payer: Self-pay | Admitting: Oncology

## 2021-04-04 ENCOUNTER — Encounter: Payer: Self-pay | Admitting: Oncology

## 2021-04-04 VITALS — BP 135/75 | HR 60 | Temp 98.7°F | Resp 18 | Wt 140.2 lb

## 2021-04-04 DIAGNOSIS — Z856 Personal history of leukemia: Secondary | ICD-10-CM

## 2021-04-04 DIAGNOSIS — Z148 Genetic carrier of other disease: Secondary | ICD-10-CM

## 2021-04-04 LAB — COMP PANEL: LEUKEMIA/LYMPHOMA

## 2021-04-04 NOTE — Progress Notes (Signed)
Hematology/Oncology Consult note [facility] [phone] [phone]   Patient Care Team: Roberta Noe, MD as PCP - General (Family Medicine) Roberta Curtis (Oncology) [facility] (Cardiology) [facility] Roberta Curtis Roberta Server, MD as Consulting Physician (Hematology and Oncology)  REFERRING PROVIDER: Lesleigh Noe, MD  CHIEF COMPLAINTS/REASON FOR VISIT:   history of AML status post bone marrow transplant, hemochromatosis carrier  HISTORY OF PRESENTING ILLNESS:   Roberta Curtis is a  80 y.o.  female with PMH listed below was seen in consultation at the request of  Roberta Noe, MD to establish care for  history of AML status post bone marrow transplant, hemochromatosis carrier. Patient moved from [place] to [place] in February 2021. She brought some of her oncology records. 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  Patient received induction daunorubincin and cytarabin.  04/18/2006- axilla mass exicision - microabscess with gram positive cocci.   04/19/2006, bone marrow biopsy showed 50% cellularity, chemotherapeutic effect, day 14.  Persistent leukemia given persistent  CD117 cells, CD34 positive cells are slightly increased. 04/28/2006, bone marrow biopsy showed 50 to 75% cellularity with immature infiltrates, mild maturation present, markedly decreased erythroid elements with dyserythropoiesis, marked megakaryocytosis with dysplastic  morphology, increased iron stores consistent with acute myeloid leukemia with multilineage dysplastic 05/28/2006 per note,  bone marrow which showed AML in remission 08/18/2006-admitted for bone marrow transplant, received busulfan and etoposide.  Received washed autologous peripheral blood stem cells on 08/27/2006.  Engraftment was noted on day 13-09/09/2006  Previously followed up with oncologist and has been having bone marrow biopsy surveillance annually and she has stayed in remission. .  Her last Bone marrow biopsy was done in 2019.  05/26/2018 Bone marrow biopsy showed normocellular marrow with adequate trilineage hematopoiesis, no morphology evidence of increase blasts, consistent with AML in remission.  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  Detail of mutation is unknown. She reports that she has had phlebotomy in the past. Reports she has had right axillary lymph node biopsy and was told by her doctors not to have blood drawn on the right upper extremity due to risk of lymphedema.  Patient tells me that her husband passed away from [facility] many years ago. Patient reports feeling very well today.Denies  fever, chills, fatigue, night sweats.  She does not eat very well and this has been a chronic issue for her.     INTERVAL HISTORY Roberta Curtis is a 80 year old female who presents to clinic today for follow-up for history of AML.  She was last seen in clinic on 09/30/2020 by Dr. Tasia Curtis.  In the interim she had bone marrow biopsy which did not show any myeloid dysplastic changes.  She was seen by urology for cystoscopy for urinary incontinence.  She was started on oxybutynin with improvement of her symptoms.  Cystoscopy was negative.  She reports doing well since her last visit.  Denies any interval infections.  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  She  is really enjoying her new position.  Has a small pea-sized growth on her left breast that was previously removed by her PCP in [place] about 2 years ago.  They performed a punch biopsy and it was negative for malignancy.  States it is started to grow back and is causing her discomfort.  Review of Systems  Constitutional:  Positive for fatigue.  Skin:  Positive for itching (Left breats leison-discmfort and itching).   MEDICAL HISTORY:  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HCC)    GERD (gastroesophageal reflux disease)    Heart murmur    Osteopenia     SURGICAL HISTORY: Past Surgical History:  Procedure Laterality Date   BONE MARROW TRANSPLANT     BREAST LUMPECTOMY Left    fatty tumor,1970's   CATARACT EXTRACTION     CESAREAN SECTION     HYSTEROTOMY     LYMPH GLAND EXCISION     MENISCUS REPAIR Right    NECK SURGERY     SALPINGECTOMY     WISDOM TOOTH EXTRACTION      SOCIAL HISTORY: Social History   Socioeconomic History   Marital status: Widowed    Spouse name: Roberta Curtis   Number of children: 3   Years of education: high school   Highest education level: Not on file  Occupational History   Not on file  Tobacco Use   Smoking status: Former    Packs/day: 1.00    Years: 6.00    Pack years: 6.00    Types: Cigarettes    Quit date: 02/20/1970    Years since quitting: 51.1   Smokeless tobacco: Never  Vaping Use   Vaping Use: Never used  Substance and Sexual Activity   Alcohol use: Not Currently   Drug use: Never   Sexual activity: Not Currently    Partners: Male    Birth control/protection: Post-menopausal  Other Topics Concern   Not on file  Social History Narrative      Right handed   Lives in a condo with husband   From [place]      02/12/20   From: [place], moved [place] be near [place]: with Roberta Curtis - husband   Health Care Proxy: Roberta Curtis (daughter)    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 ([place], [place]), [place] ([place])      Enjoys: meet new people, socialable      Exercise: not currently - cleaning   Diet: picky eater - eats veggies - but her husband doesn't like these things      Safety   Seat belts: Yes    Guns: Yes  and secure   Safe in relationships: Yes    Social Determinants of Health   Financial Resource Strain: Low Risk    Difficulty of Paying Living Expenses: Not hard at all  Food Insecurity: No Food Insecurity   Worried About Charity fundraiser in the Last Year: Never true   [place] in the Last Year: Never true  Transportation Needs: No Transportation Needs   Lack of Transportation (Medical): No   Lack of Transportation (Non-Medical): No  Physical Activity: Inactive   Days of Exercise per Week: 0 days   Minutes of Exercise per Session: 0 min  Stress: No Stress Concern Present   Feeling of Stress : Not at all  Social Connections: Not  on file  Intimate Partner Violence: Not At Risk   Fear of Current or Ex-Partner: No   Emotionally Abused: No   Physically Abused: No   Sexually Abused: No    FAMILY HISTORY: Family History  Problem Relation Age of Onset   Retinitis pigmentosa Mother    Stroke Mother    Liver cancer Father    Heart disease Father    Heart disease Brother    Blindness Brother     ALLERGIES:  is allergic to jadenu [deferasirox], levofloxacin, ondansetron hcl, codeine, oxycodone-acetaminophen, and penicillins.  MEDICATIONS:  Current Outpatient Medications  Medication Sig Dispense Refill   albuterol (VENTOLIN HFA) 108 (90 Base) MCG/ACT inhaler Inhale 1-2 puffs into the lungs as needed for wheezing or shortness of breath. 6.7 g 1   BIOTIN PO Take 1,000 mcg by mouth daily.      ibandronate (BONIVA) 150 MG tablet TAKE 1 TABLET (150 MG TOTAL) BY MOUTH EVERY 30 (THIRTY) DAYS. 3 tablet 1   mirabegron ER (MYRBETRIQ) 50 MG TB24  tablet Take 1 tablet (50 mg total) by mouth daily. (Patient taking differently: Take 50 mg by mouth as needed.) 30 tablet 11   omeprazole (PRILOSEC) 20 MG capsule Take 1 capsule (20 mg total) by mouth as needed. 90 capsule 1   oxybutynin (DITROPAN-XL) 10 MG 24 hr tablet Take 1 tablet (10 mg total) by mouth daily. 90 tablet 3   POTASSIUM PO Take 99 mg by mouth daily.      prochlorperazine (COMPAZINE) 10 MG tablet Take 1 tablet (10 mg total) by mouth daily. 90 tablet 1   VITAMIN D PO Take 50,000 mcg by mouth daily.      No current facility-administered medications for this visit.     PHYSICAL EXAMINATION: ECOG PERFORMANCE STATUS: 0 - Asymptomatic There were no vitals filed for this visit.  There were no vitals filed for this visit.   Physical Exam Constitutional:      Appearance: Normal appearance.  HENT:     Head: Normocephalic and atraumatic.  Eyes:     Pupils: Pupils are equal, round, and reactive to light.  Cardiovascular:     Rate and Rhythm: Normal rate and regular rhythm.     Heart sounds: Normal heart sounds. No murmur heard. Pulmonary:     Effort: Pulmonary effort is normal.     Breath sounds: Normal breath sounds. No wheezing.  Abdominal:     General: Bowel sounds are normal. There is no distension.     Palpations: Abdomen is soft.     Tenderness: There is no abdominal tenderness.  Musculoskeletal:        General: Normal range of motion.     Cervical back: Normal range of motion.  Skin:    General: Skin is warm and dry.     Findings: No rash.  Neurological:     Mental Status: She is alert and oriented to person, place, and time.  Psychiatric:        Judgment: Judgment normal.    LABORATORY DATA:  I have reviewed the data as listed Lab Results  Component Value Date   WBC 8.6 04/01/2021   HGB 11.2 (L) 04/01/2021   HCT 34.9 (L) 04/01/2021   MCV 75.5 (L) 04/01/2021   PLT 204 04/01/2021   Recent Labs    09/04/20 1302 02/18/21 1051 04/01/21 1231  NA 140  142 138  K 3.8 4.1 4.0  CL 108 108 106  CO2 '24 27 23  ' GLUCOSE 113*  95 97  BUN '17 13 16  ' CREATININE 0.73 0.84 0.92  CALCIUM 9.1 9.2 9.2  GFRNONAA >60  --  >60  PROT 7.1 7.1 7.0  ALBUMIN 4.0 4.2 4.1  AST '21 17 25  ' ALT '15 11 17  ' ALKPHOS 58 58 51  BILITOT 0.4 0.7 0.6    Iron/TIBC/Ferritin/ %Sat    Component Value Date/Time   IRON 125 09/04/2020 1302   IRON 108 08/06/2019 0000   TIBC 364 09/04/2020 1302   TIBC 324 08/06/2019 0000   FERRITIN 52 09/04/2020 1302   FERRITIN 74 08/06/2019 0000   IRONPCTSAT 34 (H) 09/04/2020 1302   IRONPCTSAT 33 08/06/2019 0000      RADIOGRAPHIC STUDIES: I have personally reviewed the radiological images as listed and agreed with the findings in the report. DG Bone Density  Result Date: 03/12/2021 EXAM: DUAL X-RAY ABSORPTIOMETRY (DXA) FOR BONE MINERAL DENSITY IMPRESSION: Your patient Zyair Rhein Coombes completed a BMD test on 03/12/2021 using the [place] (software version: 14.10) manufactured by [employer]. The following summarizes the results of our evaluation. Technologist: MTB PATIENT BIOGRAPHICAL: Name: Yanissa, Michalsky Patient ID: [id number] Birth Date: Jan 05, 1941 Height: 61.5 in. Gender: Female Exam Date: 03/12/2021 Weight: 139.5 lbs. Indications: Caucasian, Hysterectomy, Kidney Disease, Oophorectomy Bilateral, Postmenopausal Fractures: Treatments: Albuterol, Boniva, Vitamin D DENSITOMETRY RESULTS: Site         Region     Measured Date Measured Age WHO Classification Young Adult T-score BMD         %Change vs. Previous Significant Change (*) DualFemur Neck Right 03/12/20[age] Osteopenia -2.3 0.717 g/cm2 - - DualFemur Total Mean 03/12/2021 79.0 Osteopenia -1.9 0.769 g/cm2 - - Left Forearm Radius 33% 03/12/20[age].0 Osteopenia -2.3 0.679 g/cm2 - - ASSESSMENT: The BMD measured at Femur Neck Right is 0.717 g/cm2 with a T-score of -2.3. This patient is considered osteopenic according to [place] [facility]) criteria. The  scan quality is good. Lumbar spine was not utilized due to advanced degenerative changes. Patient is not a candidate for FRAX due to [place]. World Pharmacologist [facility]) criteria for post-menopausal, Caucasian Women: Normal:                   T-score at or above -1 SD Osteopenia/low bone mass: T-score between -1 and -2.5 SD Osteoporosis:             T-score at or below -2.5 SD RECOMMENDATIONS: 1. All patients should optimize calcium and vitamin D intake. 2. Consider FDA-approved medical therapies in postmenopausal women and men aged 18 years and older, based on the following: a. A hip or vertebral(clinical or morphometric) fracture b. T-score < -2.5 at the femoral neck or spine after appropriate evaluation to exclude secondary causes c. Low bone mass (T-score between -1.0 and -2.5 at the femoral neck or spine) and a 10-year probability of a hip fracture > 3% or a 10-year probability of a major osteoporosis-related fracture > 20% based on the [place]-adapted WHO algorithm 3. Clinician judgment and/or patient preferences may indicate treatment for people with 10-year fracture probabilities above or below these levels FOLLOW-UP: People with diagnosed cases of osteoporosis or at high risk for fracture should have regular bone mineral density tests. For patients eligible for Medicare, routine testing is allowed once every 2 years. The testing frequency can be increased to one year for patients who have rapidly progressing disease, those who are receiving or discontinuing medical therapy to restore bone mass, or have additional risk  factors. I have reviewed this report, and agree with the above findings. Mark A. Thornton Papas, M.D. [facility] Electronically Signed   By: Lavonia Dana M.D.   On: 03/12/2021 13:30   MM 3D SCREEN BREAST BILATERAL  Result Date: 03/18/2021 CLINICAL DATA:  Screening. EXAM: DIGITAL SCREENING BILATERAL MAMMOGRAM WITH TOMOSYNTHESIS AND CAD TECHNIQUE: Bilateral screening digital craniocaudal  and mediolateral oblique mammograms were obtained. Bilateral screening digital breast tomosynthesis was performed. The images were evaluated with computer-aided detection. COMPARISON:  Previous exam(s). ACR Breast Density Category c: The breast tissue is heterogeneously dense, which may obscure small masses. FINDINGS: There are no findings suspicious for malignancy. IMPRESSION: No mammographic evidence of malignancy. A result letter of this screening mammogram will be mailed directly to the patient. RECOMMENDATION: Screening mammogram in one year. (Code:SM-B-01Y) BI-RADS CATEGORY  1: Negative. Electronically Signed   By: Marin Olp M.D.   On: 03/18/2021 15:32      ASSESSMENT & PLAN:  No diagnosis found.  History of AML in 2007 status post bone marrow transplant. Labs today are unremarkable. Flow cytometry is negative-no monoclonal B-cell population is detected.  No circulating blasts are detected. Repeat bone marrow biopsy from February 2022 appears negative. She continues to be in remission. Will continue annual bone marrow biopsy for persistent detection of chromosome 20 and her previous bone marrow results.  Hemochromatosis carrier- Reports for approximately 6 months in [place] having to give blood. Most recent ferritin is from 09/04/2020 which is 52. LFTs are normal. Hemoglobin is 11.2. She does not need a phlebotomy.  Microcytic anemia- Chronic and stable. Hemoglobin today is 11.2 and MCV is 75.5  Left breast growth/lesion- Referral to dermatology  Return to clinic in 6 months.  She will have annual bone marrow biopsy.  We will get this scheduled closer to the date.  No orders of the defined types were placed in this encounter.   All questions were answered. The patient knows to call the clinic with any problems questions or concerns.  I spent 25 minutes dedicated to the care of this patient (face-to-face and non-face-to-face) on the date of the encounter to include what  is described in the assessment and plan.  Faythe Casa, NP 04/04/2021 3:57 PM

## 2021-04-04 NOTE — Telephone Encounter (Signed)
Per Perry Mount I cannot schedule her 6 months out at this time because we do not have a nurses' or a physicians' schedule. We would have to wait until it gets closerto the time that she needs to be scheduled. Let me know what I should do. Thanks  Do you want patient to keep App. 10-08-21 as schedule  [facility] has being cancel

## 2021-04-04 NOTE — Telephone Encounter (Signed)
Dr. Tasia Catchings is doing annual bone marrow biopsy for this patient. That sounds good.   Faythe Casa, NP 04/04/2021 3:49 PM

## 2021-04-04 NOTE — Telephone Encounter (Signed)
Called pt with bone marrow biopsy date and she states that she has already had one in Feb. Is another one needed? Please contact pt with response, if none needed, specials scheduling will be need to be contacted to cancel.

## 2021-04-04 NOTE — Telephone Encounter (Signed)
scheduled for Fri 8/5 at 8:30a and to arrive at 7:30a.  The IR Nurse will call her a day or so before the procedure to go over her instructions. Please contact pt with appt details.

## 2021-04-08 ENCOUNTER — Other Ambulatory Visit: Payer: Self-pay | Admitting: Oncology

## 2021-04-08 DIAGNOSIS — Z856 Personal history of leukemia: Secondary | ICD-10-CM

## 2021-04-08 NOTE — Progress Notes (Signed)
Labs ordered for January per Dr. Tasia Catchings.   Faythe Casa, NP 04/08/2021 5:16 AM

## 2021-04-11 ENCOUNTER — Ambulatory Visit: Payer: Medicare HMO

## 2021-04-28 ENCOUNTER — Other Ambulatory Visit: Payer: Self-pay | Admitting: Family Medicine

## 2021-04-28 DIAGNOSIS — R110 Nausea: Secondary | ICD-10-CM

## 2021-04-28 NOTE — Telephone Encounter (Signed)
Refilled: 08/13/2020 Last OV: 02/18/2021 Next OV: 02/23/2022

## 2021-05-08 ENCOUNTER — Ambulatory Visit (INDEPENDENT_AMBULATORY_CARE_PROVIDER_SITE_OTHER): Payer: Medicare HMO | Admitting: Dermatology

## 2021-05-08 ENCOUNTER — Other Ambulatory Visit: Payer: Self-pay

## 2021-05-08 DIAGNOSIS — D485 Neoplasm of uncertain behavior of skin: Secondary | ICD-10-CM

## 2021-05-08 DIAGNOSIS — L910 Hypertrophic scar: Secondary | ICD-10-CM

## 2021-05-08 DIAGNOSIS — L820 Inflamed seborrheic keratosis: Secondary | ICD-10-CM

## 2021-05-08 DIAGNOSIS — D235 Other benign neoplasm of skin of trunk: Secondary | ICD-10-CM | POA: Diagnosis not present

## 2021-05-08 DIAGNOSIS — L821 Other seborrheic keratosis: Secondary | ICD-10-CM

## 2021-05-08 NOTE — Patient Instructions (Signed)

## 2021-05-08 NOTE — Progress Notes (Signed)
New Patient Visit  Subjective  Roberta Curtis is a 80 y.o. female who presents for the following: irritated skin lesions (On the back at bra-line, L breast, and L flank - patient would like them checked and treated today.).  She notes the scar at her chest is bothersome.   The following portions of the chart were reviewed this encounter and updated as appropriate:   Tobacco  Allergies  Meds  Problems  Med Hx  Surg Hx  Fam Hx      Review of Systems:  No other skin or systemic complaints except as noted in HPI or Assessment and Plan.  Objective  Well appearing patient in no apparent distress; mood and affect are within normal limits.  A focused examination was performed including the trunk and extremities. Relevant physical exam findings are noted in the Assessment and Plan.  Mid back R of midline 0.4 cm erythematous papule   Left Breast Dyspigmented smooth plaque or papule  Left Breast x 1, L flank x 1 (2) Erythematous keratotic or waxy stuck-on papule or plaque.    Assessment & Plan  Neoplasm of uncertain behavior of skin Mid back R of midline  Epidermal / dermal shaving  Lesion diameter (cm):  0.4 Informed consent: discussed and consent obtained   Timeout: patient name, date of birth, surgical site, and procedure verified   Procedure prep:  Patient was prepped and draped in usual sterile fashion Prep type:  Isopropyl alcohol Anesthesia: the lesion was anesthetized in a standard fashion   Anesthetic:  1% lidocaine w/ epinephrine 1-100,000 buffered w/ 8.4% NaHCO3 Instrument used: flexible razor blade   Hemostasis achieved with: pressure, aluminum chloride and electrodesiccation   Outcome: patient tolerated procedure well   Post-procedure details: sterile dressing applied and wound care instructions given   Dressing type: bandage and petrolatum    Specimen 1 - Surgical pathology Differential Diagnosis: D48.5 r/o irritated nevus vs other  Check Margins:  No  Hypertrophic scar Left Breast  symptomatic  Intralesional injection - Left Breast Location: L breast  Informed Consent: Discussed risks (infection, pain, bleeding, bruising, thinning of the skin, loss of skin pigment, lack of resolution, and recurrence of lesion) and benefits of the procedure, as well as the alternatives. Informed consent was obtained. Preparation: The area was prepared a standard fashion.  Procedure Details: An intralesional injection was performed with Kenalog 2 mg/cc. 0.1 cc in total were injected.  Total number of injections: 1  Plan: The patient was instructed on post-op care. Recommend OTC analgesia as needed for pain.   Inflamed seborrheic keratosis Left Breast x 1, L flank x 1  Prior to procedure, discussed risks of blister formation, small wound, skin dyspigmentation, or rare scar following cryotherapy. Recommend Vaseline ointment to treated areas while healing.   Destruction of lesion - Left Breast x 1, L flank x 1 Complexity: simple   Destruction method: cryotherapy   Informed consent: discussed and consent obtained   Timeout:  patient name, date of birth, surgical site, and procedure verified Lesion destroyed using liquid nitrogen: Yes   Region frozen until ice ball extended beyond lesion: Yes   Outcome: patient tolerated procedure well with no complications   Post-procedure details: wound care instructions given    Seborrheic Keratoses - Stuck-on, waxy, tan-brown papules and/or plaques  - Benign-appearing - Discussed benign etiology and prognosis. - Observe - Call for any changes  Return if symptoms worsen or fail to improve.  Luther Redo, CMA, am acting  as scribe for Forest Gleason, MD .   Documentation: I have reviewed the above documentation for accuracy and completeness, and I agree with the above.  Forest Gleason, MD

## 2021-05-20 ENCOUNTER — Telehealth: Payer: Self-pay

## 2021-05-20 NOTE — Telephone Encounter (Signed)
-----   Message from Alfonso Patten, MD sent at 05/20/2021  5:20 PM EDT ----- Skin , mid back right of midline POROMA  Benign growth. No additional treatment needed unless it is bothersome. If it is bothersome, we could do a small skin surgery to remove it.  MAs please call. Thank you!

## 2021-05-22 ENCOUNTER — Encounter: Payer: Self-pay | Admitting: Dermatology

## 2021-06-02 NOTE — Addendum Note (Signed)
Addended by: Alfonso Patten on: 06/02/2021 05:45 PM   Modules accepted: Level of Service

## 2021-06-12 ENCOUNTER — Emergency Department: Payer: Medicare HMO

## 2021-06-12 ENCOUNTER — Telehealth: Payer: Self-pay

## 2021-06-12 ENCOUNTER — Other Ambulatory Visit: Payer: Self-pay

## 2021-06-12 ENCOUNTER — Emergency Department
Admission: EM | Admit: 2021-06-12 | Discharge: 2021-06-12 | Disposition: A | Discharge status: Home / Self Care | Payer: Medicare HMO | Attending: Emergency Medicine | Admitting: Emergency Medicine

## 2021-06-12 ENCOUNTER — Telehealth: Payer: Self-pay | Admitting: Family Medicine

## 2021-06-12 DIAGNOSIS — Z87891 Personal history of nicotine dependence: Secondary | ICD-10-CM | POA: Insufficient documentation

## 2021-06-12 DIAGNOSIS — K2100 Gastro-esophageal reflux disease with esophagitis, without bleeding: Secondary | ICD-10-CM | POA: Insufficient documentation

## 2021-06-12 DIAGNOSIS — N182 Chronic kidney disease, stage 2 (mild): Secondary | ICD-10-CM | POA: Diagnosis not present

## 2021-06-12 DIAGNOSIS — R0789 Other chest pain: Secondary | ICD-10-CM | POA: Diagnosis present

## 2021-06-12 DIAGNOSIS — R079 Chest pain, unspecified: Secondary | ICD-10-CM

## 2021-06-12 DIAGNOSIS — Z20822 Contact with and (suspected) exposure to covid-19: Secondary | ICD-10-CM | POA: Insufficient documentation

## 2021-06-12 LAB — BASIC METABOLIC PANEL
Anion gap: 10 (ref 5–15)
BUN: 14 mg/dL (ref 8–23)
CO2: 25 mmol/L (ref 22–32)
Calcium: 10.1 mg/dL (ref 8.9–10.3)
Chloride: 103 mmol/L (ref 98–111)
Creatinine, Ser: 0.91 mg/dL (ref 0.44–1.00)
GFR, Estimated: >60 mL/min (ref >60)
Glucose, Bld: 89 mg/dL (ref 70–99)
Potassium: 3.4 mmol/L — ABNORMAL LOW (ref 3.5–5.1)
Sodium: 138 mmol/L (ref 135–145)

## 2021-06-12 LAB — HEPATIC FUNCTION PANEL
ALT: 16 U/L (ref 0–44)
AST: 26 U/L (ref 15–41)
Albumin: 3.7 g/dL (ref 3.5–5.0)
Alkaline Phosphatase: 46 U/L (ref 38–126)
Bilirubin, Direct: <0.1 mg/dL (ref 0.0–0.2)
Total Bilirubin: 0.7 mg/dL (ref 0.3–1.2)
Total Protein: 6.5 g/dL (ref 6.5–8.1)

## 2021-06-12 LAB — CBC
HCT: 35.9 % — ABNORMAL LOW (ref 36.0–46.0)
Hemoglobin: 11.6 g/dL — ABNORMAL LOW (ref 12.0–15.0)
MCH: 23.9 pg — ABNORMAL LOW (ref 26.0–34.0)
MCHC: 32.3 g/dL (ref 30.0–36.0)
MCV: 74 fL — ABNORMAL LOW (ref 80.0–100.0)
Platelets: 224 10*3/uL (ref 150–400)
RBC: 4.85 MIL/uL (ref 3.87–5.11)
RDW: 15.4 % (ref 11.5–15.5)
WBC: 7.7 10*3/uL (ref 4.0–10.5)
nRBC: 0 % (ref 0.0–0.2)

## 2021-06-12 LAB — RESP PANEL BY RT-PCR (FLU A&B, COVID) ARPGX2
Influenza A by PCR: NEGATIVE
Influenza B by PCR: NEGATIVE
SARS Coronavirus 2 by RT PCR: NEGATIVE

## 2021-06-12 LAB — TROPONIN I (HIGH SENSITIVITY)
Troponin I (High Sensitivity): 8 ng/L (ref <18)
Troponin I (High Sensitivity): 9 ng/L (ref <18)

## 2021-06-12 LAB — LIPASE, BLOOD: Lipase: 24 U/L (ref 11–51)

## 2021-06-12 IMAGING — CR DG CHEST 2V
1 series · 2 of 2 positions shown · non-contrast
Comparison: None.

CLINICAL DATA: Chest pain and diaphoresis since yesterday.

EXAM:
CHEST - 2 VIEW

[Series 1: w chest pa · 0.14mm/px · 2 of 2 slices shown]
[im 1/2]
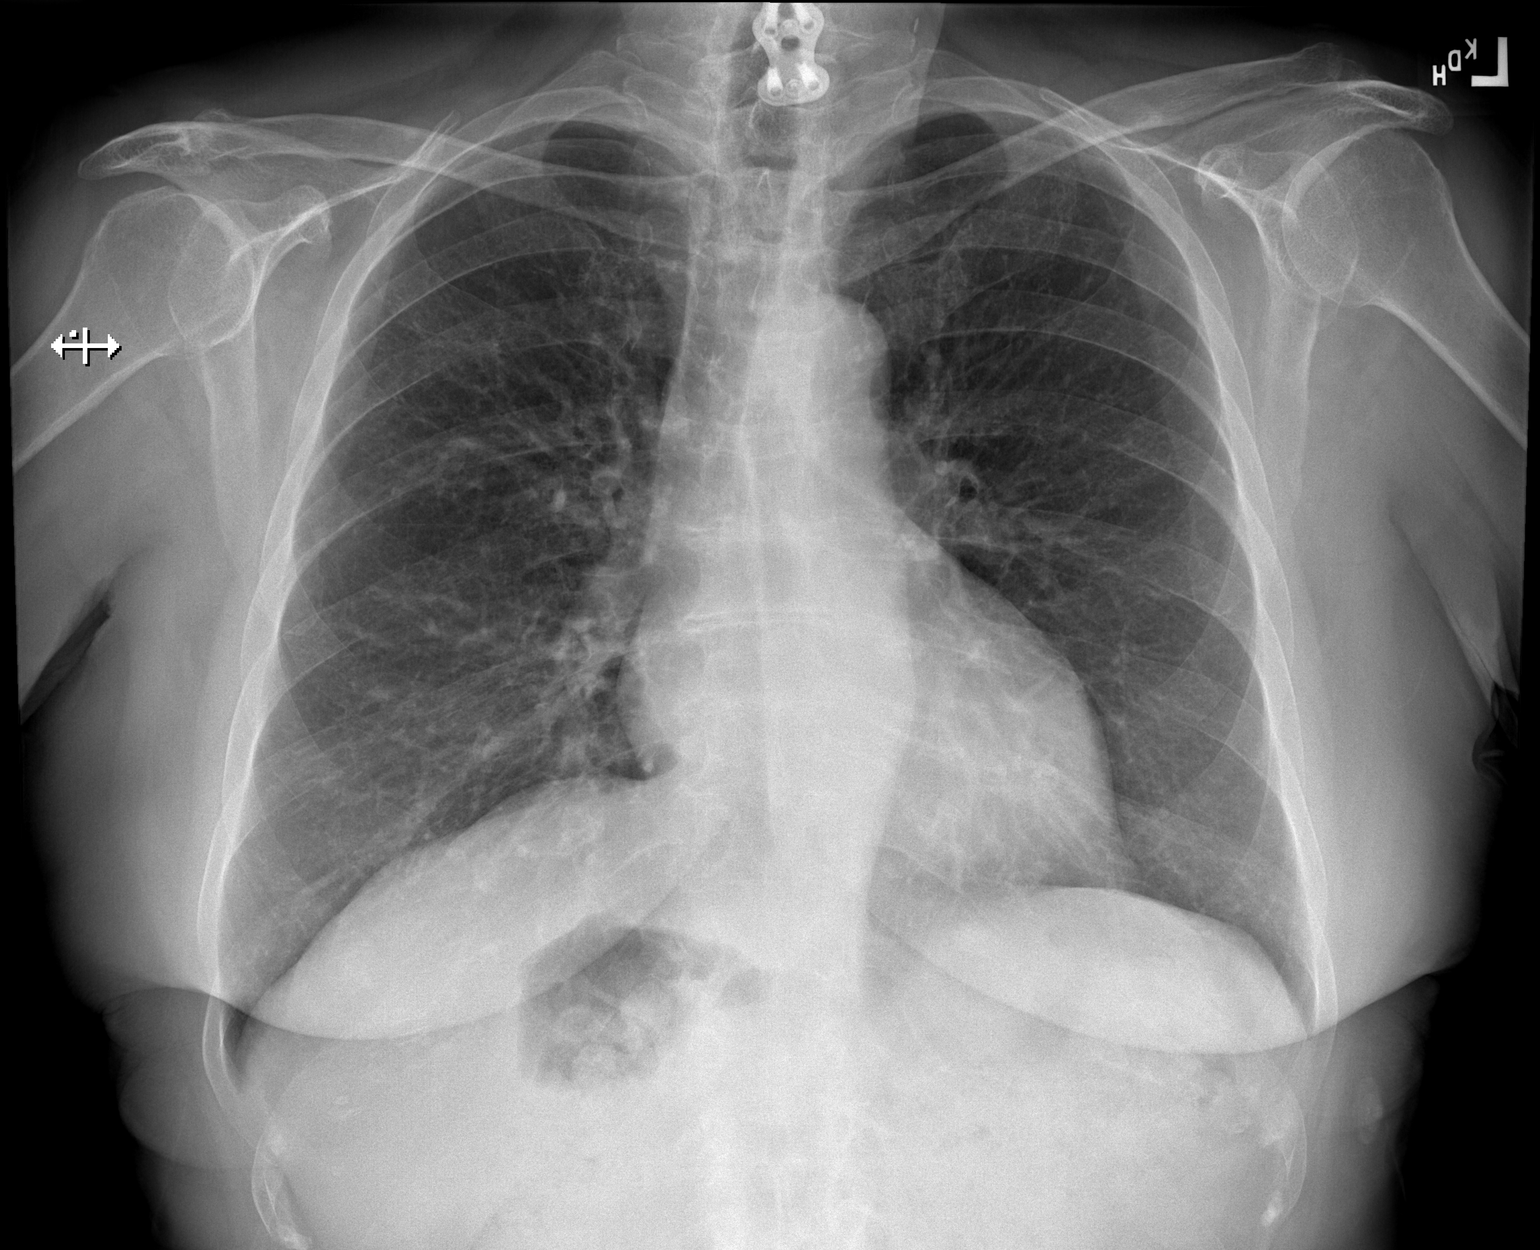
[im 2/2]
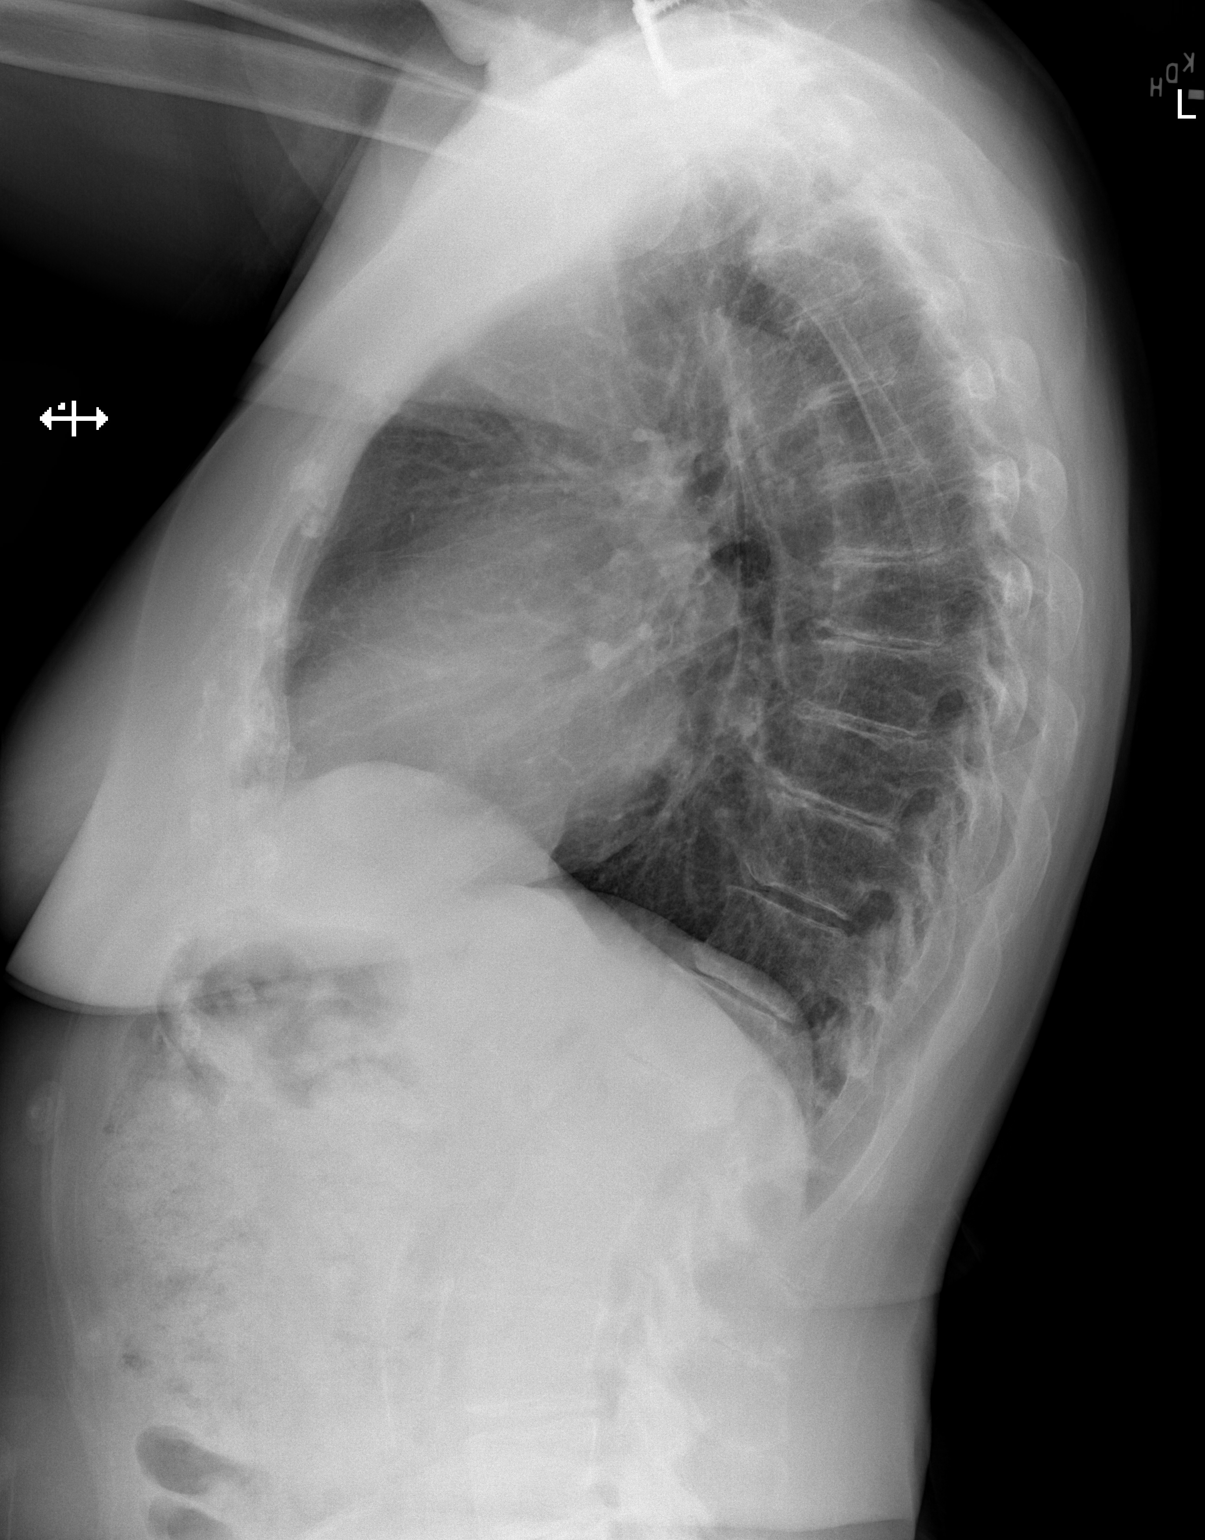

[2 of 2 positions shown; findings below may reference images not displayed]

FINDINGS: The cardiac silhouette, mediastinal and hilar contours are normal.

Chronic bronchitic and possible mild emphysematous changes but no
acute pulmonary process. No pleural effusions or pulmonary lesions.
The bony thorax is intact.
IMPRESSION: Chronic appearing lung changes but no acute pulmonary findings.

## 2021-06-12 MED ORDER — ALUM & MAG HYDROXIDE-SIMETH 200-200-20 MG/5ML PO SUSP
15.0000 mL | Freq: Once | ORAL | Status: AC
  Administered 2021-06-12: 15 mL via ORAL
  Filled 2021-06-12: qty 30

## 2021-06-12 MED ORDER — LIDOCAINE VISCOUS HCL 2 % MT SOLN
15.0000 mL | Freq: Once | OROMUCOSAL | Status: AC
  Administered 2021-06-12: 15 mL via ORAL
  Filled 2021-06-12: qty 15

## 2021-06-12 NOTE — ED Triage Notes (Signed)
Pt c/o substernal chest pain that radiates into the back since yesterday with SOB and some intermittent diaphoresis. Pt is in NAD, ambulatory with a steady gait

## 2021-06-12 NOTE — Telephone Encounter (Signed)
Please see note below access nurse note where I spoke with pt; and per chart review pt is already at [facility] ED.   [place] [facility] - Client TELEPHONE ADVICE RECORD AccessNurse Patient Name: Roberta Curtis Gender: Female DOB: 03-May-1941 Age: 80 Y 3 M 6 D Return Phone Number: [phone] (Primary), [phone] (Secondary) Address: [place] [address] Client [facility] - Client Client Site [place] - Day Physician Waunita Schooner- MD Contact Type Call Who Is Calling Patient / Member / Family / Caregiver Call Type Triage / Clinical Relationship To Patient Self Return Phone Number [phone] (Secondary) Chief Complaint CHEST PAIN - pain, pressure, heaviness or tightness Reason for Call Symptomatic / Request for [place] states she has heart burn, pressure in her chest. Translation No Nurse Assessment Nurse: Raphael Gibney, RN, Vanita Ingles Date/Time (Eastern Time): 06/12/2021 11:18:20 AM Confirm and document reason for call. If symptomatic, describe symptoms. ---Caller states she has heart burn. Has heaviness in her chest. Does the patient have any new or worsening symptoms? ---Yes Will a triage be completed? ---Yes Related visit to physician within the last 2 weeks? ---No Does the PT have any chronic conditions? (i.e. diabetes, asthma, this includes High risk factors for pregnancy, etc.) ---Yes List chronic conditions. ---remission for leukemia Is this a behavioral health or substance abuse call? ---No Guidelines Guideline Title Affirmed Question Affirmed Notes Nurse Date/Time (Eastern Time) Chest Pain [1] Chest pain lasts > 5 minutes AND [2] described as crushing, pressure-like, or heavy Raphael Gibney, RN, Vera 06/12/2021 11:20:55 AM Disp. Time Eilene Ghazi Time) Disposition Final User 06/12/2021 11:16:59 AM Send to Urgent Jeannette Corpus,  Tiffany 06/12/2021 11:25:37 AM Call EMS 911 Now Yes Raphael Gibney, RN, Vanita Ingles PLEASE NOTE: All timestamps contained within this report are represented as [place] Standard Time. CONFIDENTIALTY NOTICE: This fax transmission is intended only for the addressee. It contains information that is legally privileged, confidential or otherwise protected from use or disclosure. If you are not the intended recipient, you are strictly prohibited from reviewing, disclosing, copying using or disseminating any of this information or taking any action in reliance on or regarding this information. If you have received this fax in error, please notify us immediately by telephone so that we can arrange for its return to [place]. Phone: [phone], Toll-Free: [phone], Fax: [phone] Page: 2 of 2 Call Id: [id number] [place] Disagree/Comply Disagree Caller Understands Yes PreDisposition Call Doctor Care Advice Given Per Guideline CALL EMS 911 NOW: * Immediate medical attention is needed. You need to hang up and call 911 (or an ambulance). CARE ADVICE given per Chest Pain (Adult) guideline. Comments User: Dannielle Burn, RN Date/Time Eilene Ghazi Time): 06/12/2021 11:25:10 AM pt states she is not going to call 911 or go to the ER. [place] [facility] [place] and is leaving to go home. pt wants an appt today User: Dannielle Burn, RN Date/Time Eilene Ghazi Time): 06/12/2021 11:29:05 AM called back line and spoke to [facility] and gave report that pt says she has heartburn and is having chest discomfort and heaviness in her chest. States she is a little SOB. Triage outcome call 911. Pt states she is not calling 911 or going to the ER but wants an appt

## 2021-06-12 NOTE — Telephone Encounter (Signed)
The access nurse note is not in portal yet and when available will add to this note. I spoke with Roberta Curtis; starting on 06/12/21 Roberta Curtis had mid CP to mid of back with sharpe pain for hours on 06/11/21; today Roberta Curtis has heaviness in center of chest and throat and Roberta Curtis feels weak all over. Roberta Curtis has SOB as usual all the time. No dizziness but H/A pain level is 0 now but had H/A earlier. Roberta Curtis denies 911 but pts husband will take her to [facility] ED now. Sending note to DR Einar Pheasant and [place] CMA.will also teams christy.

## 2021-06-12 NOTE — ED Provider Notes (Signed)
[facility] Emergency Department Provider Note   ____________________________________________   Event Date/Time   First MD Initiated Contact with Patient 06/12/21 1545     (approximate)  I have reviewed the triage vital signs and the nursing notes.   HISTORY  Chief Complaint Chest Pain    HPI Roberta Curtis is a 80 y.o. female with past medical history of AML status post bone marrow transplant, GERD, CKD, and mitral regurgitation who presents to the ED complaining of chest pain.  Patient reports that she has been dealing with a constant pressure in the center of her chest since yesterday morning.  She states that yesterday she belched and had a significant sour taste in her mouth, has also been dealing with discomfort in her throat.  She reports a history of GERD, but states symptoms today are much more severe than usual.  She has not had any fevers, cough, or shortness of breath.  She denies any pain or swelling in her legs.  She denies any significant cardiac history.  She took some Tums yesterday with partial relief.        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[place])    GERD (gastroesophageal reflux disease)    Heart murmur    Osteopenia     Patient Active Problem List   Diagnosis Date Noted   Urge incontinence 09/05/2020   RUQ pain 09/05/2020   Burn 09/05/2020   Chronic nausea 08/13/2020   Chronic bilateral low back pain without sciatica 14/97/0263   Mild diastolic dysfunction 78/58/8502   AML (acute myeloid leukemia) in remission ([place]) 02/12/2020   S/P bone marrow transplant ([place]) 02/12/2020   Osteopenia 02/12/2020   GERD (gastroesophageal reflux disease) 02/12/2020   Mitral valve regurgitation 02/12/2020   Prediabetes 02/12/2020   CKD (chronic kidney disease) stage 2, 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    Past Surgical History:  Procedure Laterality Date   BONE MARROW TRANSPLANT     BREAST LUMPECTOMY Left     fatty tumor,1970's   CATARACT EXTRACTION     CESAREAN SECTION     HYSTEROTOMY     LYMPH GLAND EXCISION     MENISCUS REPAIR Right    NECK SURGERY     SALPINGECTOMY     WISDOM TOOTH EXTRACTION      Prior to Admission medications   Medication Sig Start Date End Date Taking? Authorizing Provider  albuterol (VENTOLIN HFA) 108 (90 Base) MCG/ACT inhaler Inhale 1-2 puffs into the lungs as needed for wheezing or shortness of breath. 02/13/20   Lesleigh Noe, MD  BIOTIN PO Take 1,000 mcg by mouth daily.     [provider]  calcium carbonate (OS-CAL - DOSED IN MG OF ELEMENTAL CALCIUM) 1250 (500 Ca) MG tablet Take 1 tablet by mouth.    [provider]  cyclobenzaprine (FLEXERIL) 5 MG tablet cyclobenzaprine 5 mg tablet  TAKE 1 TABLET BY MOUTH EVERY 8 HOURS AS NEEDED AT BEDTIME    [provider]  ibandronate (BONIVA) 150 MG tablet TAKE 1 TABLET (150 MG TOTAL) BY MOUTH EVERY 30 (THIRTY) DAYS. 01/31/21   Lesleigh Noe, MD  mirabegron ER (MYRBETRIQ) 50 MG TB24 tablet Take 1 tablet (50 mg total) by mouth daily. Patient taking differently: Take 50 mg by mouth as needed. 10/07/20   Bjorn Loser, MD  omeprazole (PRILOSEC) 20 MG capsule Take 1 capsule (20 mg total) by mouth as needed. 08/13/20   Lesleigh Noe, MD  oxybutynin (DITROPAN-XL) 10 MG 24 hr tablet Take 1 tablet (10 mg total) by mouth daily. 11/18/20   Bjorn Loser, MD  POTASSIUM PO Take 99 mg by mouth daily.     [provider]  prochlorperazine (COMPAZINE) 10 MG tablet TAKE 1 TABLET BY MOUTH EVERY DAY 04/28/21   Lesleigh Noe, MD  VITAMIN D PO Take 50,000 mcg by mouth daily.     [provider]    Allergies Jadenu [deferasirox], Levofloxacin, Ondansetron hcl, Codeine, Oxycodone-acetaminophen, and Penicillins  Family History  Problem Relation Age of Onset   Retinitis pigmentosa Mother    Stroke Mother    Liver cancer Father    Heart disease Father    Heart disease Brother     Blindness Brother     Social History Social History   Tobacco Use   Smoking status: Former    Packs/day: 1.00    Years: 6.00    Pack years: 6.00    Types: Cigarettes    Quit date: 02/20/1970    Years since quitting: 51.3   Smokeless tobacco: Never  Vaping Use   Vaping Use: Never used  Substance Use Topics   Alcohol use: Not Currently   Drug use: Never    Review of Systems  Constitutional: No fever/chills Eyes: No visual changes. ENT: No sore throat. Cardiovascular: Positive for chest pain. Respiratory: Denies shortness of breath. Gastrointestinal: No abdominal pain.  No nausea, no vomiting.  No diarrhea.  No constipation. Genitourinary: Negative for dysuria. Musculoskeletal: Negative for back pain. Skin: Negative for rash. Neurological: Negative for headaches, focal weakness or numbness.  ____________________________________________   PHYSICAL EXAM:  VITAL SIGNS: ED Triage Vitals  Enc Vitals Group     BP 06/12/21 1236 (!) 152/67     Pulse Rate 06/12/21 1236 80     Resp 06/12/21 1236 20     Temp 06/12/21 1236 98.4 F (36.9 C)     Temp Source 06/12/21 1236 Oral     SpO2 06/12/21 1236 97 %     Weight 06/12/21 1231 137 lb (62.1 kg)     Height 06/12/21 1231 5\' 3"  (1.6 m)     Head Circumference --      Peak Flow --      Pain Score 06/12/21 1231 3     Pain Loc --      Pain Edu? --      Excl. in [place]? --     Constitutional: Alert and oriented. Eyes: Conjunctivae are normal. Head: Atraumatic. Nose: No congestion/rhinnorhea. Mouth/Throat: Mucous membranes are moist. Neck: Normal ROM Cardiovascular: Normal rate, regular rhythm. Grossly normal heart sounds.  2+ radial pulses bilaterally. Respiratory: Normal respiratory effort.  No retractions. Lungs CTAB.  No chest wall tenderness to palpation. Gastrointestinal: Soft and nontender. No distention. Genitourinary: deferred Musculoskeletal: No lower extremity tenderness nor edema. Neurologic:  Normal speech and  language. No gross focal neurologic deficits are appreciated. Skin:  Skin is warm, dry and intact. No rash noted. Psychiatric: Mood and affect are normal. Speech and behavior are normal.  ____________________________________________   LABS (all labs ordered are listed, but only abnormal results are displayed)  Labs Reviewed  BASIC METABOLIC PANEL - Abnormal; Notable for the following components:      Result Value   Potassium 3.4 (*)    All other components within normal limits  CBC - Abnormal; Notable for the following components:   Hemoglobin 11.6 (*)    HCT 35.9 (*)    MCV 74.0 (*)  MCH 23.9 (*)    All other components within normal limits  RESP PANEL BY RT-PCR (FLU A&B, COVID) ARPGX2  HEPATIC FUNCTION PANEL  LIPASE, BLOOD  TROPONIN I (HIGH SENSITIVITY)  TROPONIN I (HIGH SENSITIVITY)   ____________________________________________  EKG  ED ECG REPORT I, Blake Divine, the attending physician, personally viewed and interpreted this ECG.   Date: 06/12/2021  EKG Time: 12:30  Rate: 77  Rhythm: normal sinus rhythm  Axis: LAD  Intervals:none  ST&T Change: None   PROCEDURES  Procedure(s) performed (including Critical Care):  Procedures   ____________________________________________   INITIAL IMPRESSION / ASSESSMENT AND PLAN / ED COURSE      80 year old female with past medical history of AML status post bone marrow transplant, GERD, and CKD who presents to the ED with chest pressure and discomfort since yesterday.  EKG shows no evidence of arrhythmia or ischemia and initial troponin is negative.  Overall low suspicion for ACS as symptoms sound most consistent with GERD and esophagitis.  Remainder of labs are unremarkable and she has no abdominal tenderness on exam.  Chest x-ray reviewed by me and shows no infiltrate, edema, or effusion.  We will treat with GI cocktail and check repeat troponin, reassess afterwards.  Repeat troponin within normal limits, patient  reports feeling much better following GI cocktail, her chest pain has now resolved.  Testing for COVID-19 and influenza is negative.  Patient is appropriate for discharge home with PCP follow-up.  She was counseled to take omeprazole consistently and take Tums or Pepcid as needed on top of this.  She was counseled to follow-up with her PCP and to return to the ED for new or worsening symptoms.  Patient and daughter agree with plan.      ____________________________________________   FINAL CLINICAL IMPRESSION(S) / ED DIAGNOSES  Final diagnoses:  Nonspecific chest pain  Gastroesophageal reflux disease with esophagitis without hemorrhage     ED Discharge Orders     None        Note:  This document was prepared using Dragon voice recognition software and may include unintentional dictation errors.    Blake Divine, MD 06/12/21 1734

## 2021-06-12 NOTE — ED Provider Notes (Signed)
HPI: Pt is a 80 y.o. female who presents with complaints of chest pain  The patient p/w  chest pain midsternal, starting yesterday now only mild in nature. Felt like indigestion but daughter wanted her checked out.  ROS: Denies fever, chest pain, vomiting  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HCC)    GERD (gastroesophageal reflux disease)    Heart murmur    Osteopenia    There were no vitals filed for this visit.  Focused Physical Exam: Gen: No acute distress Head: atraumatic, normocephalic Eyes: Extraocular movements grossly intact; conjunctiva clear CV: RRR Lung: No increased WOB, no stridor GI: ND, no obvious masses Neuro: Alert and awake  Medical Decision Making and Plan: Given the patient's initial medical screening exam, the following diagnostic evaluation has been ordered. The patient will be placed in the appropriate treatment space, once one is available, to complete the evaluation and treatment. I have discussed the plan of care with the patient and I have advised the patient that an ED physician or mid-level practitioner will reevaluate their condition after the test results have been received, as the results may give them additional insight into the type of treatment they may need.   Diagnostics: labs, ekg, chest xray   Treatments: none immediately   Vanessa Chelan, MD 06/12/21 1238

## 2021-06-12 NOTE — Telephone Encounter (Signed)
Agree with ER evaluation for acute and persistent pain

## 2021-06-20 ENCOUNTER — Telehealth: Payer: Self-pay

## 2021-06-20 NOTE — Telephone Encounter (Signed)
Pt called increased leakage on oxybutynin. She states it was working great for the first few months. She will take it at 8 am and by 12 pm she is leaking. She is wondering if it can be increased or if there is something else she can try. The myrbetriq was too expensive for her

## 2021-06-23 MED ORDER — OXYBUTYNIN CHLORIDE ER 15 MG PO TB24: 15.0000 mg | ORAL_TABLET | Freq: Every day | ORAL | 3 refills | Status: DC

## 2021-06-23 NOTE — Telephone Encounter (Signed)
Sent in new prescription pt aware.

## 2021-06-25 ENCOUNTER — Telehealth: Payer: Self-pay

## 2021-06-25 NOTE — Telephone Encounter (Signed)
Pt was seen [facility] ED on 06/12/21; pt said she was dx with acid reflux and pt has been taking omeprazole 20 mg one daily with no pain since taking med. Pt said she got a call from someone about FU appt suggested by ED and pt said since she has not had any more pain wants to know if Dr Einar Pheasant thinks she does need to come in to see Dr Einar Pheasant or not. Pt does not take BP so no BP reading to report. Pt said she has had no indigestion pain at all since ED visit. Pt also said when in [place] she got a pneumonia shot (pt does not know what pneumonia shot she got); pt wants to know if she needs another pneumonia shot. Pt had flu shot 1 wk ago at [facility] medical mall. Pt request cb after Dr Einar Pheasant reviews note.

## 2021-06-25 NOTE — Telephone Encounter (Signed)
1) no need for ER f/u since symptoms improving  2) pneumonia shot - routing to [place] to try and get vaccine records to be able to advise need for additional shot.

## 2021-07-09 ENCOUNTER — Telehealth: Payer: Self-pay | Admitting: Family Medicine

## 2021-07-09 ENCOUNTER — Other Ambulatory Visit: Payer: Self-pay

## 2021-07-09 ENCOUNTER — Ambulatory Visit (INDEPENDENT_AMBULATORY_CARE_PROVIDER_SITE_OTHER): Payer: Medicare HMO | Admitting: Family Medicine

## 2021-07-09 VITALS — BP 132/72 | HR 75 | Temp 97.0°F | Ht 61.5 in | Wt 136.5 lb

## 2021-07-09 DIAGNOSIS — L817 Pigmented purpuric dermatosis: Secondary | ICD-10-CM | POA: Insufficient documentation

## 2021-07-09 MED ORDER — TRIAMCINOLONE ACETONIDE 0.1 % EX CREA: 1.0000 "application " | TOPICAL_CREAM | Freq: Two times a day (BID) | CUTANEOUS | 1 refills | Status: DC

## 2021-07-09 NOTE — Telephone Encounter (Signed)
See note from today

## 2021-07-09 NOTE — Telephone Encounter (Signed)
Pt called in stating that she has big rashes on both of her legs and they are splotchy but not itching

## 2021-07-09 NOTE — Telephone Encounter (Signed)
PLEASE NOTE: All timestamps contained within this report are represented as [place] Standard Time. CONFIDENTIALTY NOTICE: This fax transmission is intended only for the addressee. It contains information that is legally privileged, confidential or otherwise protected from use or disclosure. If you are not the intended recipient, you are strictly prohibited from reviewing, disclosing, copying using or disseminating any of this information or taking any action in reliance on or regarding this information. If you have received this fax in error, please notify us immediately by telephone so that we can arrange for its return to [place]. Phone: [phone], Toll-Free: [phone], Fax: [phone] Page: 1 of 2 Call Id: [id number] [place] RECORD AccessNurse Patient Name: Roberta Curtis Gender: Female DOB: May 24, 1941 Age: 80 Y 23 M 2 D Return Phone Number: [phone] (Primary) Address: [place]  [address] Client  [facility] - Client Client Site [place] Physician Waunita Schooner- MD Contact Type Call Who Is Calling Patient / Member / Family / Caregiver Call Type Triage / Clinical Relationship To Patient Self Return Phone Number [phone] (Primary) Chief Complaint Rash - Widespread Reason for Call Symptomatic / Request for Roberta Curtis has big red blotches on both her legs and ankles. It is spreading but in blotches. Translation No Nurse Assessment Nurse: Roberta Simmering, RN, Benjamine Mola Date/Time Eilene Ghazi Time): 07/08/2021 7:04:12 PM Confirm and document reason for call. If symptomatic, describe symptoms. ---Caller has big red blotches on both her legs and ankles. It is spreading but in blotches. does not itch. on knees down, on both legs. looks like a rash and in spots. denies pain. they are warm to touch. Only on lower  legs Does the patient have any new or worsening symptoms? ---Yes Will a triage be completed? ---Yes Related visit to physician within the last 2 weeks? ---No Does the PT have any chronic conditions? (i.e. diabetes, asthma, this includes High risk factors for pregnancy, etc.) ---Yes List chronic conditions. ---acid reflux, remission from leukemia x 10 years Is this a behavioral health or substance abuse call? ---No Guidelines Guideline Title Affirmed Question Affirmed Notes Nurse Date/Time (Eastern Time) Rash - Widespread On Drugs Taking new prescription antibiotic (Exception: finished taking new prescription antibiotic) Roberta Simmering, RN, Benjamine Mola 07/08/2021 7:09:10 PM PLEASE NOTE: All timestamps contained within this report are represented as [place] Standard Time. CONFIDENTIALTY NOTICE: This fax transmission is intended only for the addressee. It contains information that is legally privileged, confidential or otherwise protected from use or disclosure. If you are not the intended recipient, you are strictly prohibited from reviewing, disclosing, copying using or disseminating any of this information or taking any action in reliance on or regarding this information. If you have received this fax in error, please notify us immediately by telephone so that we can arrange for its return to [place]. Phone: [phone], Toll-Free: [phone], Fax: [phone] Page: 2 of 2 Call Id: [id number] [place]. Time Eilene Ghazi Time) Disposition Final User 07/08/2021 6:59:48 PM Send to [place], RN, Jonita Albee 07/08/2021 7:17:49 PM See PCP within 24 Hours Yes Roberta Simmering, RN, Lorin Glass Disagree/Comply Comply Caller Understands Yes PreDisposition Call Doctor Care Advice Given Per Guideline STOP THE MEDICATION: SEE PCP WITHIN 24 HOURS: ANTIHISTAMINE MEDICINES FOR HIVES OR FOR SEVERE ITCHING: CALL BACK IF: * You become worse CARE ADVICE given per Rash - Widespread on Drugs (Adult)  guideline Comments User: Linton Rump, RN Date/Time Eilene Ghazi Time): 07/08/2021 7:12:15  PM on day 4 of Azithromycin User: Linton Rump, RN Date/Time Eilene Ghazi Time): 07/08/2021 7:19:35 PM Patient will be calling first thing in the morning to request an appointment. Referrals REFERRED TO PCP OFFICE

## 2021-07-09 NOTE — Telephone Encounter (Signed)
PLEASE NOTE: All timestamps contained within this report are represented as [place] Standard Time. CONFIDENTIALTY NOTICE: This fax transmission is intended only for the addressee. It contains information that is legally privileged, confidential or otherwise protected from use or disclosure. If you are not the intended recipient, you are strictly prohibited from reviewing, disclosing, copying using or disseminating any of this information or taking any action in reliance on or regarding this information. If you have received this fax in error, please notify us immediately by telephone so that we can arrange for its return to [place]. Phone: [phone], Toll-Free: [phone], Fax: [phone] Page: 1 of 1 Call Id: [id number] [place] [facility] - Client Nonclinical Telephone Record  AccessNurse Client [place] [facility] - Client Client Site [place] Physician Waunita Schooner- MD Contact Type Call Who Is Calling Physician / Provider / Hospital Call Type Provider Call Message Only Reason for Call Request to Admit or Transfer Patient Initial Comment Caller states patient has rashes on both legs. No appointments with providers. Patient Name Marlon Vonruden [facility] Patient DOB 07-Feb-1941 Requesting Provider Waunita Schooner Physician Number [phone] Facility Name [facility] Room Number .[name] Disp. Time Disposition Final User 07/09/2021 8:21:20 AM General Information Provided Yes Ian Malkin Call Closed By: Ian Malkin Transaction Date/Time: 07/09/2021 8:18:06 AM (ET)

## 2021-07-09 NOTE — Progress Notes (Signed)
   Subjective:     Roberta Curtis is a 80 y.o. female presenting for Rash (On both lower legs x 1 day. No itching, burning or pain)     HPI  #Rash - no known exposure - not itchy - no pain - started yesterday - not raised   No other symptoms - diarrhea, nausea/vomiting   Review of Systems   Social History   Tobacco Use  Smoking Status Former   Packs/day: 1.00   Years: 6.00   Pack years: 6.00   Types: Cigarettes   Quit date: 02/20/1970   Years since quitting: 51.4  Smokeless Tobacco Never        Objective:    BP Readings from Last 3 Encounters:  07/09/21 132/72  06/12/21 (!) 150/49  04/04/21 135/75   Wt Readings from Last 3 Encounters:  07/09/21 136 lb 8 oz (61.9 kg)  06/12/21 137 lb (62.1 kg)  04/04/21 140 lb 4 oz (63.6 kg)    BP 132/72   Pulse 75   Temp (!) 97 F (36.1 C) (Temporal)   Ht 5' 1.5" (1.562 m)   Wt 136 lb 8 oz (61.9 kg)   SpO2 100%   BMI 25.37 kg/m    Physical Exam Constitutional:      General: She is not in acute distress.    Appearance: She is well-developed. She is not diaphoretic.  HENT:     Right Ear: External ear normal.     Left Ear: External ear normal.  Eyes:     Conjunctiva/sclera: Conjunctivae normal.  Cardiovascular:     Rate and Rhythm: Normal rate.  Pulmonary:     Effort: Pulmonary effort is normal.  Musculoskeletal:     Cervical back: Neck supple.  Skin:    General: Skin is warm and dry.     Capillary Refill: Capillary refill takes less than 2 seconds.     Comments: Bilateral legs with scattered macular purpuric non-blanching rash which is light pink in color. No ttp  Neurological:     Mental Status: She is alert. Mental status is at baseline.  Psychiatric:        Mood and Affect: Mood normal.        Behavior: Behavior normal.          Assessment & Plan:   Problem List Items Addressed This Visit       Cardiovascular and Mediastinum   Pigmented purpuric dermatosis - Primary    Suspected  diagnosis vs venous stasis. Discussed she could do nothing vs starting steroids. Advised dermatology f/u if no improvement or worsening      Relevant Medications   triamcinolone cream (KENALOG) 0.1 %     Return if symptoms worsen or fail to improve.  Lesleigh Noe, MD  This visit occurred during the SARS-CoV-2 public health emergency.  Safety protocols were in place, including screening questions prior to the visit, additional usage of staff PPE, and extensive cleaning of exam room while observing appropriate contact time as indicated for disinfecting solutions.

## 2021-07-09 NOTE — Telephone Encounter (Signed)
Spoke to patient by telephone and was advised that she was fine yesterday. Patient stated that she noticed late yesterday that she had developed a rash on both legs below the knees. Patient stated that the rash does not itch. Patient stated that she has not changed any her detergent, lotions, or soaps.. Patient stated that she is not on any new medications. Patient denies any other symptoms. Patient had a negative covid screening. Patient scheduled for an in office visit with Dr. Einar Pheasant today 07/09/21 at 11:20. Patient was given ER precautions and she verbalized understanding

## 2021-07-09 NOTE — Telephone Encounter (Signed)
Patient scheduled for an appointment today with Dr. Einar Pheasant.

## 2021-07-09 NOTE — Telephone Encounter (Signed)
Pt notified.  Pt not sure of where she got pneumonia shot.

## 2021-07-09 NOTE — Assessment & Plan Note (Signed)
Suspected diagnosis vs venous stasis. Discussed she could do nothing vs starting steroids. Advised dermatology f/u if no improvement or worsening

## 2021-07-09 NOTE — Patient Instructions (Signed)
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

## 2021-07-24 ENCOUNTER — Ambulatory Visit (INDEPENDENT_AMBULATORY_CARE_PROVIDER_SITE_OTHER): Payer: Medicare HMO | Admitting: Family

## 2021-07-24 ENCOUNTER — Other Ambulatory Visit: Payer: Self-pay

## 2021-07-24 ENCOUNTER — Encounter: Payer: Self-pay | Admitting: Family

## 2021-07-24 VITALS — BP 136/70 | HR 67 | Temp 97.6°F | Ht 61.5 in | Wt 135.0 lb

## 2021-07-24 DIAGNOSIS — B370 Candidal stomatitis: Secondary | ICD-10-CM | POA: Insufficient documentation

## 2021-07-24 MED ORDER — NYSTATIN 100000 UNIT/ML MT SUSP: 5.0000 mL | Freq: Four times a day (QID) | OROMUCOSAL | 0 refills | Status: AC

## 2021-07-24 NOTE — Progress Notes (Deleted)
Review of Systems  Constitutional:  Negative for chills and fever.  HENT:  Negative for congestion, ear pain, sinus pressure and sore throat.   Respiratory:  Negative for cough, shortness of breath and wheezing.   Cardiovascular:  Negative for chest pain and palpitations.

## 2021-07-24 NOTE — Assessment & Plan Note (Signed)
Prescribed nystatin, pt to start medication as prescribed. If no improvement in next week pt advised to f/u

## 2021-07-24 NOTE — Progress Notes (Signed)
Established Patient Office Visit  Subjective:  Patient ID: Roberta Curtis, female    DOB: January 04, 1941  Age: 80 y.o. MRN: [id number]  CC:  Chief Complaint  Patient presents with   Thrush    HPI Roberta Curtis is here today with c/o a coating on her white tongue and a feeling of thickened sputum in her throat. This has been going on for the last 3-4 days. No sore throat. No fever/chills, no sinus pressure/congestion and or cough. She has tried to drink some gatorade to loosen up the feeling in her throat to no avail.   Heartburn seems to be controlled per pt, taking omeprazole daily.   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HCC)    GERD (gastroesophageal reflux disease)    Heart murmur    Osteopenia     Past Surgical History:  Procedure Laterality Date   BONE MARROW TRANSPLANT     BREAST LUMPECTOMY Left    fatty tumor,1970's   CATARACT EXTRACTION     CESAREAN SECTION     HYSTEROTOMY     LYMPH GLAND EXCISION     MENISCUS REPAIR Right    NECK SURGERY     SALPINGECTOMY     WISDOM TOOTH EXTRACTION      Family History  Problem Relation Age of Onset   Retinitis pigmentosa Mother    Stroke Mother    Liver cancer Father    Heart disease Father    Heart disease Brother    Blindness Brother     Social History   Socioeconomic History   Marital status: Widowed    Spouse name: Alvis Lemmings   Number of children: 3   Years of education: high school   Highest education level: Not on file  Occupational History   Not on file  Tobacco Use   Smoking status: Former    Packs/day: 1.00    Years: 6.00    Pack years: 6.00    Types: Cigarettes    Quit date: 02/20/1970    Years since quitting: 51.4   Smokeless tobacco: Never  Vaping Use   Vaping Use: Never used  Substance and Sexual Activity   Alcohol use: Not Currently   Drug use: Never   Sexual activity: Not Currently    Partners: Male    Birth control/protection: Post-menopausal   Other Topics Concern   Not on file  Social History Narrative      Right handed   Lives in a condo with husband   From [place]      02/12/20   From: [place], moved [place] be near [place]: with Alvis Lemmings - husband   Health Care Proxy: Teresita Madura (daughter)   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      Family: Product/process development scientist, lives in [place]), [place] ([place], [place]), [place] ([place])      Enjoys: meet new people, socialable      Exercise: not currently - cleaning   Diet: picky eater - eats veggies - but her husband doesn't like these things      Safety   Seat belts: Yes    Guns: Yes  and secure   Safe in relationships: Yes    Social Determinants of Health   Financial Resource Strain: Low Risk    Difficulty of Paying Living Expenses: Not hard at all  Food Insecurity: No Food Insecurity   Worried About Charity fundraiser in the  Last Year: Never true   Ran Out of Food in the Last Year: Never true  Transportation Needs: No Transportation Needs   Lack of Transportation (Medical): No   Lack of Transportation (Non-Medical): No  Physical Activity: Inactive   Days of Exercise per Week: 0 days   Minutes of Exercise per Session: 0 min  Stress: No Stress Concern Present   Feeling of Stress : Not at all  Social Connections: Not on file  Intimate Partner Violence: Not At Risk   Fear of Current or Ex-Partner: No   Emotionally Abused: No   Physically Abused: No   Sexually Abused: No    Outpatient Medications Prior to Visit  Medication Sig Dispense Refill   albuterol (VENTOLIN HFA) 108 (90 Base) MCG/ACT inhaler Inhale 1-2 puffs into the lungs as needed for wheezing or shortness of breath. 6.7 g 1   BIOTIN PO Take 1,000 mcg by mouth daily.      calcium carbonate (OS-CAL - DOSED IN MG OF ELEMENTAL CALCIUM) 1250 (500 Ca) MG tablet Take 1 tablet by mouth.     cyclobenzaprine (FLEXERIL) 5 MG tablet cyclobenzaprine 5 mg tablet  TAKE 1 TABLET BY  MOUTH EVERY 8 HOURS AS NEEDED AT BEDTIME     ibandronate (BONIVA) 150 MG tablet TAKE 1 TABLET (150 MG TOTAL) BY MOUTH EVERY 30 (THIRTY) DAYS. 3 tablet 1   omeprazole (PRILOSEC) 20 MG capsule Take 1 capsule (20 mg total) by mouth as needed. 90 capsule 1   oxybutynin (DITROPAN XL) 15 MG 24 hr tablet Take 1 tablet (15 mg total) by mouth daily. 90 tablet 3   POTASSIUM PO Take 99 mg by mouth daily.      prochlorperazine (COMPAZINE) 10 MG tablet TAKE 1 TABLET BY MOUTH EVERY DAY 90 tablet 1   triamcinolone cream (KENALOG) 0.1 % Apply 1 application topically 2 (two) times daily. 30 g 1   VITAMIN D PO Take 50,000 mcg by mouth daily.      No facility-administered medications prior to visit.    Allergies  Allergen Reactions   Jadenu [Deferasirox] Other (See Comments)    Elevated Iron, headache and rash   Levofloxacin Other (See Comments)   Ondansetron Hcl Other (See Comments)   Codeine Palpitations   Oxycodone-Acetaminophen Palpitations   Penicillins Rash    ROS Review of Systems  Constitutional:  Negative for chills and fever.  HENT:  Negative for congestion, ear pain, mouth sores, sore throat and trouble swallowing.   Respiratory:  Negative for cough and shortness of breath.   Cardiovascular:  Negative for chest pain and palpitations.     Objective:    Physical Exam Constitutional:      Appearance: Normal appearance.  HENT:     Right Ear: Tympanic membrane normal.     Left Ear: Tympanic membrane normal.     Nose: No congestion or rhinorrhea.     Mouth/Throat:     Mouth: Mucous membranes are moist.     Tongue: Lesions (white budding on tongue) present.     Pharynx: No posterior oropharyngeal erythema.  Eyes:     Extraocular Movements: Extraocular movements intact.     Conjunctiva/sclera: Conjunctivae normal.     Pupils: Pupils are equal, round, and reactive to light.  Neck:     Thyroid: No thyroid mass.     Trachea: Trachea normal.  Cardiovascular:     Rate and Rhythm:  Normal rate and regular rhythm.     Pulses: Normal pulses.  Heart sounds: Normal heart sounds.  Pulmonary:     Effort: Pulmonary effort is normal.     Breath sounds: Normal breath sounds.  Lymphadenopathy:     Cervical: No cervical adenopathy.     Right cervical: No superficial cervical adenopathy.    Left cervical: No superficial cervical adenopathy.  Neurological:     Mental Status: She is alert.    BP 136/70   Pulse 67   Temp 97.6 F (36.4 C) (Temporal)   Ht 5' 1.5" (1.562 m)   Wt 135 lb (61.2 kg)   SpO2 99%   BMI 25.09 kg/m  Wt Readings from Last 3 Encounters:  07/24/21 135 lb (61.2 kg)  07/09/21 136 lb 8 oz (61.9 kg)  06/12/21 137 lb (62.1 kg)     Health Maintenance Due  Topic Date Due   Pneumonia Vaccine 71+ Years old (1 - PCV) 03/06/1947   INFLUENZA VACCINE  04/07/2021   COVID-19 Vaccine (5 - Booster for Pfizer series) 04/30/2021   Zoster Vaccines- Shingrix (2 of 2) 05/13/2021    There are no preventive care reminders to display for this patient.  No results found for: TSH Lab Results  Component Value Date   WBC 7.7 06/12/2021   HGB 11.6 (L) 06/12/2021   HCT 35.9 (L) 06/12/2021   MCV 74.0 (L) 06/12/2021   PLT 224 06/12/2021   Lab Results  Component Value Date   NA 138 06/12/2021   K 3.4 (L) 06/12/2021   CO2 25 06/12/2021   GLUCOSE 89 06/12/2021   BUN 14 06/12/2021   CREATININE 0.91 06/12/2021   BILITOT 0.7 06/12/2021   ALKPHOS 46 06/12/2021   AST 26 06/12/2021   ALT 16 06/12/2021   PROT 6.5 06/12/2021   ALBUMIN 3.7 06/12/2021   CALCIUM 10.1 06/12/2021   ANIONGAP 10 06/12/2021   GFR 66.31 02/18/2021   Lab Results  Component Value Date   CHOL 155 02/18/2021   Lab Results  Component Value Date   HDL 51.00 02/18/2021   Lab Results  Component Value Date   LDLCALC 91 02/18/2021   Lab Results  Component Value Date   TRIG 64.0 02/18/2021   Lab Results  Component Value Date   CHOLHDL 3 02/18/2021   Lab Results  Component Value  Date   HGBA1C 6.1 02/18/2021      Assessment & Plan:   Problem List Items Addressed This Visit       Digestive   Candida infection, oral - Primary    Prescribed nystatin, pt to start medication as prescribed. If no improvement in next week pt advised to f/u      Relevant Medications   nystatin (MYCOSTATIN) 100000 UNIT/ML suspension    Meds ordered this encounter  Medications   nystatin (MYCOSTATIN) 100000 UNIT/ML suspension    Sig: Take 5 mLs (500,000 Units total) by mouth 4 (four) times daily for 7 days. Swish in mouth for at least 60 seconds, and then spit out. Do not swallow.    Dispense:  140 mL    Refill:  0    Order Specific Question:   Supervising Provider    Answer:   Diona Browner, AMY E [[id number]]    Follow-up: Return in about 1 week (around 07/31/2021), or if symptoms worsen or fail to improve.    [name], FNP

## 2021-07-24 NOTE — Patient Instructions (Addendum)
  Start medication as prescribed to preferred pharmacy.  If symptoms do not improve over the next week  please follow up with your pcp.

## 2021-08-04 ENCOUNTER — Encounter: Payer: Self-pay | Admitting: Family

## 2021-08-04 ENCOUNTER — Other Ambulatory Visit: Payer: Self-pay

## 2021-08-04 ENCOUNTER — Ambulatory Visit (INDEPENDENT_AMBULATORY_CARE_PROVIDER_SITE_OTHER): Payer: Medicare HMO | Admitting: Family

## 2021-08-04 VITALS — BP 136/84 | HR 64 | Temp 97.9°F | Ht 61.5 in | Wt 135.0 lb

## 2021-08-04 DIAGNOSIS — K219 Gastro-esophageal reflux disease without esophagitis: Secondary | ICD-10-CM

## 2021-08-04 MED ORDER — ESOMEPRAZOLE MAGNESIUM 20 MG PO CPDR: 20.0000 mg | DELAYED_RELEASE_CAPSULE | Freq: Every day | ORAL | 1 refills | Status: DC

## 2021-08-04 NOTE — Progress Notes (Signed)
Subjective:     Patient ID: Roberta Curtis, female    DOB: [birth year], 80 y.o.   MRN: [id number]  Chief Complaint  Patient presents with   Ritta Slot    Stated that it's not any better or worse at this point.     HPI Patient is in today for f/u on dry, metallic feeling in mouth. Had white budding on her tongue which is improved, but still with the strange taste in her mouth. She feels like she has to drink something to make the dry sensation go away, she takes omeprazole 20 mg daily.   She is wanting to get pneumonia vaccine today, however, she received one in [place] 06/2019 but unsure which one she received.   Health Maintenance Due  Topic Date Due   Pneumonia Vaccine 49+ Years old (1 - PCV) [birth year]06/1947   INFLUENZA VACCINE  04/07/2021   COVID-19 Vaccine (5 - Booster for Pfizer series) 04/30/2021   Zoster Vaccines- Shingrix (2 of 2) 05/13/2021    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HCC)    GERD (gastroesophageal reflux disease)    Heart murmur    Osteopenia     Past Surgical History:  Procedure Laterality Date   BONE MARROW TRANSPLANT     BREAST LUMPECTOMY Left    fatty tumor,1970's   CATARACT EXTRACTION     CESAREAN SECTION     HYSTEROTOMY     LYMPH GLAND EXCISION     MENISCUS REPAIR Right    NECK SURGERY     SALPINGECTOMY     WISDOM TOOTH EXTRACTION      Family History  Problem Relation Age of Onset   Retinitis pigmentosa Mother    Stroke Mother    Liver cancer Father    Heart disease Father    Heart disease Brother    Blindness Brother     Social History   Socioeconomic History   Marital status: Widowed    Spouse name: Alvis Lemmings   Number of children: 3   Years of education: high school   Highest education level: Not on file  Occupational History   Not on file  Tobacco Use   Smoking status: Former    Packs/day: 1.00    Years: 6.00    Pack years: 6.00    Types: Cigarettes    Quit date: 02/20/1970    Years  since quitting: 51.4   Smokeless tobacco: Never  Vaping Use   Vaping Use: Never used  Substance and Sexual Activity   Alcohol use: Not Currently   Drug use: Never   Sexual activity: Not Currently    Partners: Male    Birth control/protection: Post-menopausal  Other Topics Concern   Not on file  Social History Narrative      Right handed   Lives in a condo with husband   From [place]      02/12/20   From: [place], moved [place] be near [place]: with [place] Proxy: Teresita Madura (daughter)   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      Family: Product/process development scientist, lives in [place]), [place] ([place], [place]), [place] ([place])      Enjoys: meet new people, socialable      Exercise: not currently - cleaning   Diet: picky eater - eats veggies - but her husband doesn't like these Therapist, sports  belts: Yes    Guns: Yes  and secure   Safe in relationships: Yes    Social Determinants of Health   Financial Resource Strain: Low Risk    Difficulty of Paying Living Expenses: Not hard at all  Food Insecurity: No Food Insecurity   Worried About Charity fundraiser in the Last Year: Never true   [place] in the Last Year: Never true  Transportation Needs: No Transportation Needs   Lack of Transportation (Medical): No   Lack of Transportation (Non-Medical): No  Physical Activity: Inactive   Days of Exercise per Week: 0 days   Minutes of Exercise per Session: 0 min  Stress: No Stress Concern Present   Feeling of Stress : Not at all  Social Connections: Not on file  Intimate Partner Violence: Not At Risk   Fear of Current or Ex-Partner: No   Emotionally Abused: No   Physically Abused: No   Sexually Abused: No    Outpatient Medications Prior to Visit  Medication Sig Dispense Refill   albuterol (VENTOLIN HFA) 108 (90 Base) MCG/ACT inhaler Inhale 1-2 puffs into the lungs as needed for wheezing or shortness  of breath. 6.7 g 1   BIOTIN PO Take 1,000 mcg by mouth daily.      calcium carbonate (OS-CAL - DOSED IN MG OF ELEMENTAL CALCIUM) 1250 (500 Ca) MG tablet Take 1 tablet by mouth.     cyclobenzaprine (FLEXERIL) 5 MG tablet cyclobenzaprine 5 mg tablet  TAKE 1 TABLET BY MOUTH EVERY 8 HOURS AS NEEDED AT BEDTIME     ibandronate (BONIVA) 150 MG tablet TAKE 1 TABLET (150 MG TOTAL) BY MOUTH EVERY 30 (THIRTY) DAYS. 3 tablet 1   oxybutynin (DITROPAN XL) 15 MG 24 hr tablet Take 1 tablet (15 mg total) by mouth daily. 90 tablet 3   POTASSIUM PO Take 99 mg by mouth daily.      prochlorperazine (COMPAZINE) 10 MG tablet TAKE 1 TABLET BY MOUTH EVERY DAY 90 tablet 1   triamcinolone cream (KENALOG) 0.1 % Apply 1 application topically 2 (two) times daily. 30 g 1   VITAMIN D PO Take 50,000 mcg by mouth daily.      omeprazole (PRILOSEC) 20 MG capsule Take 1 capsule (20 mg total) by mouth as needed. 90 capsule 1   No facility-administered medications prior to visit.    Allergies  Allergen Reactions   Jadenu [Deferasirox] Other (See Comments)    Elevated Iron, headache and rash   Levofloxacin Other (See Comments)   Ondansetron Hcl Other (See Comments)   Codeine Palpitations   Oxycodone-Acetaminophen Palpitations   Penicillins Rash    Review of Systems  Constitutional:  Negative for chills and fever.  HENT: Negative.         Metallic taste in mouth, frequent clearing of throat  Respiratory:  Negative for cough, sputum production and wheezing.   Cardiovascular:  Negative for chest pain.  Gastrointestinal:  Negative for nausea.  Skin:  Negative for rash.      Objective:    Physical Exam Constitutional:      General: She is not in acute distress.    Appearance: Normal appearance. She is normal weight. She is not ill-appearing.  HENT:     Head: Normocephalic.     Nose: Nose normal. No congestion.     Mouth/Throat:     Mouth: Mucous membranes are moist.     Pharynx: No posterior oropharyngeal  erythema.  Neurological:  Mental Status: She is alert.    BP 136/84   Pulse 64   Temp 97.9 F (36.6 C) (Temporal)   Ht 5' 1.5" (1.562 m)   Wt 135 lb (61.2 kg)   SpO2 97%   BMI 25.09 kg/m  Wt Readings from Last 3 Encounters:  08/04/21 135 lb (61.2 kg)  07/24/21 135 lb (61.2 kg)  07/09/21 136 lb 8 oz (61.9 kg)       Assessment & Plan:   Problem List Items Addressed This Visit       Digestive   GERD (gastroesophageal reflux disease) - Primary    Trial ppi change from omeprazole to nexium, rx sent to pharmacy. Pt instructed to decrease spicy, fried fatty foods as well as decreased chocolate and caffeine. Instructed to f/u if no improvement of symptoms.       Relevant Medications   esomeprazole (NEXIUM) 20 MG capsule    I have discontinued Audrea Muscat Ostergaard's omeprazole. I am also having her start on esomeprazole. Additionally, I am having her maintain her POTASSIUM PO, BIOTIN PO, VITAMIN D PO, albuterol, ibandronate, calcium carbonate, prochlorperazine, cyclobenzaprine, oxybutynin, and triamcinolone cream.  Meds ordered this encounter  Medications   esomeprazole (NEXIUM) 20 MG capsule    Sig: Take 1 capsule (20 mg total) by mouth daily at 12 noon.    Dispense:  30 capsule    Refill:  1    Order Specific Question:   Supervising Provider    Answer:   BEDSOLE, AMY E [[id number]]

## 2021-08-04 NOTE — Patient Instructions (Addendum)
Today we changed your omeprazole to nexium 20 mg that you will take instead once daily. If symptoms do not improve please follow up.   Take this medication for two weeks, administer 30 minutes prior to breakfast each am. Try to decrease and or avoid spicy foods, fried fatty foods, and also caffeine and chocolate as these can increase heartburn symptoms.    Also please get in touch with your old pcp from [place] to see if you can see which pneumonococcal vaccine you received there so we can determine which one to give next.  It was a pleasure seeing you today! Please do not hesitate to reach out with any questions and or concerns.  Regards,   [name]

## 2021-08-04 NOTE — Assessment & Plan Note (Signed)
Trial ppi change from omeprazole to nexium, rx sent to pharmacy. Pt instructed to decrease spicy, fried fatty foods as well as decreased chocolate and caffeine. Instructed to f/u if no improvement of symptoms.

## 2021-08-06 ENCOUNTER — Telehealth: Payer: Self-pay | Admitting: Family Medicine

## 2021-08-06 NOTE — Telephone Encounter (Signed)
Please call patient and let her know that I did change the medication she was on omeprazole which is Prilosec and I sent in a new prescription for [place] omeprazole which is Nexium.

## 2021-08-06 NOTE — Telephone Encounter (Signed)
Pt called stating that Lawerance Bach was supposed to call her in a different medication than esomeprazole (NEXIUM) 20 MG capsule. Pt states that she called in the same medication. Pt states that she already have that medication. Please advise.

## 2021-08-06 NOTE — Telephone Encounter (Signed)
Patient called and informed that we changed omeprazole to esomeprazole. Patient stated that the pharmacy messed up and will let us know if she doesn't get the right medication. Patient stated understanding and appreciation.

## 2021-08-14 NOTE — Telephone Encounter (Signed)
Pt called in stated Rx  esomeprazole (NEXIUM) 20 MG capsule . Is not working and would like to know next steps . Maybe a referral to ENT . Please Advise  [phone]

## 2021-08-15 NOTE — Telephone Encounter (Signed)
Called the patient and she was not home, a message was left with her husband to call back and . He stated he would inform her to call.

## 2021-08-15 NOTE — Telephone Encounter (Signed)
Pt returning your call. Pt would like for you to call her back after 4 at [phone].

## 2021-08-18 ENCOUNTER — Other Ambulatory Visit: Payer: Self-pay | Admitting: Family

## 2021-08-18 DIAGNOSIS — K149 Disease of tongue, unspecified: Secondary | ICD-10-CM

## 2021-08-18 NOTE — Telephone Encounter (Signed)
[place] Night - Client Nonclinical Telephone Record  [facility] Client [facility] Night - Client Client Site [place] Provider Waunita Schooner- MD Contact Type Call Who Is Calling Patient / Member / Family / Caregiver Caller Name Zakia Sainato [facility] Caller Phone Number [phone] Patient Name Roberta Curtis Patient DOB 26-Aug-1941 Call Type Message Only Information Provided Reason for Call Request for General Office Information Initial Comment Caller states she was going to receive call back after 4 and never received a callback. She would like to know if she should go back to the office or be referred to ENT. Additional Comment Office hours provided Disp. Time Disposition Final User 08/15/2021 5:34:39 PM General Information Provided Yes Achilles Dunk Call Closed By: Achilles Dunk Transaction Date/Time: 08/15/2021 5:26:02 PM (ET

## 2021-08-18 NOTE — Telephone Encounter (Signed)
Patient called and asked if she was doing better on the Nexium instead of the esomeprazole. Patient stated that she is doing better with no reflux but she still is having that coating on her tongue. Patient stated that she would like an ENT referral to [facility] ENT. Please advise.

## 2021-08-18 NOTE — Telephone Encounter (Signed)
Patient called and informed of her referral to [facility]. Patient is aware to be on the look out for a call from them to set up her appointment. Patient stated understanding and appreciation and will wait to hear from them.

## 2021-08-21 ENCOUNTER — Ambulatory Visit (INDEPENDENT_AMBULATORY_CARE_PROVIDER_SITE_OTHER): Payer: Medicare HMO

## 2021-08-21 ENCOUNTER — Other Ambulatory Visit: Payer: Self-pay

## 2021-08-21 DIAGNOSIS — Z23 Encounter for immunization: Secondary | ICD-10-CM | POA: Diagnosis not present

## 2021-08-21 NOTE — Progress Notes (Signed)
Per orders of Alma Friendly NP, injection of Prevnar 23 given by Ophelia Shoulder, CMA Patient tolerated injection well.

## 2021-08-28 ENCOUNTER — Telehealth: Payer: Self-pay

## 2021-08-28 ENCOUNTER — Other Ambulatory Visit: Payer: Self-pay | Admitting: Family

## 2021-08-28 DIAGNOSIS — K219 Gastro-esophageal reflux disease without esophagitis: Secondary | ICD-10-CM

## 2021-08-28 NOTE — Telephone Encounter (Signed)
Per Rulon Abide visit in July, pt is to return in jan/feb for BM bx and MD for discussion of results. Bm Bx scheduled for 1/16 @ 9am with arrival time 8am. Called pt to notify her of appt, no answer. Detailed VM left with appt detail. Call back number provided, in case she has questions.

## 2021-09-02 ENCOUNTER — Telehealth: Payer: Self-pay

## 2021-09-02 NOTE — Telephone Encounter (Signed)
Pt called back, Joellen with another pt.

## 2021-09-02 NOTE — Telephone Encounter (Signed)
Called patient left message to call office for more information.   reason for Call Symptomatic / Request for Health Information Initial Comment Caller states that she had her second shingle shot(left arm) and the arm where she where received the shot is red and swollen and warm,pink, and is moving down her arm above the elbow.  [facility] Night - Client TELEPHONE ADVICE RECORD [facility] Patient Name: Roberta Curtis Gender: Female DOB: [birth year] Age: 80 Y 47 M 24 D Return Phone Number: [phone] (Primary) Address: City/ State/ Zip: [place]  [address] Client [facility] Night - Client Client Site [place] Provider Waunita Schooner- MD Contact Type Call Who Is Calling Patient / Member / Family / Caregiver Call Type Triage / Clinical Relationship To Patient Self Return Phone Number [phone] (Primary) Chief Complaint Swelling (generalized) Reason for Call Symptomatic / Request for [place] states that she had her second shingle shot(left arm) and the arm where she where received the shot is red and swollen and warm,pink, and is moving down her arm above the elbow. Translation No Nurse Assessment Nurse: Donna Christen, RN, Legrand Como Date/Time Eilene Ghazi Time): 08/30/2021 1:23:12 PM Confirm and document reason for call. If symptomatic, describe symptoms. ---Caller states that she had her second shingle shot (left arm) and the arm where she where received the shot is red and swollen and warm,pink, and is moving down her arm above the elbow. Does the patient have any new or worsening symptoms? ---Yes Will a triage be completed? ---Yes Related visit to physician within the last 2 weeks? ---No Does the PT have any chronic conditions? (i.e. diabetes, asthma, this includes High risk factors for pregnancy, etc.) ---No Is this a behavioral health or substance  abuse call? ---No Guidelines Guideline Title Affirmed Question Affirmed Notes Nurse Date/Time (Eastern Time) Immunization Reactions [1] Over 3 days (72 hours) since shot AND [2] redness, swelling or pain getting worse Darleen Crocker 08/30/2021 1:24:20 PM Disp. Time Eilene Ghazi Time) Disposition Final User 08/30/2021 12:57:21 PM Send to Leach, RN, April PLEASE NOTE: All timestamps contained within this report are represented as [place] Standard Time. CONFIDENTIALTY NOTICE: This fax transmission is intended only for the addressee. It contains information that is legally privileged, confidential or otherwise protected from use or disclosure. If you are not the intended recipient, you are strictly prohibited from reviewing, disclosing, copying using or disseminating any of this information or taking any action in reliance on or regarding this information. If you have received this fax in error, please notify us immediately by telephone so that we can arrange for its return to [place]. Phone: [phone], Toll-Free: [phone], Fax: [phone] Page: 2 of 2 Call Id: [id number] 08/30/2021 1:30:25 PM Call PCP within 24 Hours Yes Donna Christen, RN, Gerome Sam Disagree/Comply Comply Caller Understands Yes PreDisposition Call Doctor Care Advice Given Per Guideline CALL PCP WITHIN 24 HOURS: * You become worse * Fever occurs CALL BACK IF: CARE ADVICE given per Immunization Reactions (Adult) guideline. * Apply a cold pack or ice in a wet washcloth to the area for 20 minutes. * Some people find that a cold pack helps with pain or itching. COLD PACK FOR PAIN OR ITCHING AT VACCINE SITE:

## 2021-09-10 NOTE — Telephone Encounter (Signed)
Stanton Kidney called in and stated that she tabitha and she filled the script for 30 instead of 90 day and wanted to know about getting a script for 90 day  Encourage patient to contact the pharmacy for refills or they can request refills through [facility]:  Please schedule appointment if longer than 1 year  NEXT APPOINTMENT DATE:  MEDICATION: esomeprazole (NEXIUM) 20 MG capsule  Is the patient out of medication? yes  PH[facility]AC[facility] [address]  Let patient know to contact pharmacy at the end of the day to make sure medication is ready.  Please notify patient to allow 48-72 hours to process  CLINICAL FILLS OUT ALL BELOW:   LAST REFILL:  QTY:  REFILL DATE:    OTHER COMMENTS:    Okay for refill?  Please advise

## 2021-09-11 NOTE — Telephone Encounter (Signed)
Duplicate message. Already refilled for 90 day.

## 2021-09-22 ENCOUNTER — Ambulatory Visit: Payer: Medicare HMO

## 2021-09-24 ENCOUNTER — Other Ambulatory Visit: Payer: Self-pay | Admitting: Radiology

## 2021-09-25 ENCOUNTER — Telehealth: Payer: Self-pay | Admitting: Family Medicine

## 2021-09-25 MED ORDER — SOLIFENACIN SUCCINATE 5 MG PO TABS: 5.0000 mg | ORAL_TABLET | Freq: Every day | ORAL | 11 refills | Status: DC

## 2021-09-25 NOTE — Telephone Encounter (Signed)
Patient called and states she has been taking the Oxybutynin 15mg  and she states she has had severe accidents the last 2 days. She feels the Oxybutynin is not working for her anymore. She is requesting another medication if possible. I do see in her chart that she was given Myrbetriq 50mg  in the past and she states it was very expensive and she could not afford it. Please advise.

## 2021-09-25 NOTE — Telephone Encounter (Signed)
Per Dr. Eloise Harman 5mg  [phone] was sent to the [facility]. Patient notified and voiced understanding.

## 2021-09-26 ENCOUNTER — Other Ambulatory Visit: Payer: Self-pay | Admitting: Radiology

## 2021-09-29 ENCOUNTER — Other Ambulatory Visit: Payer: Self-pay

## 2021-09-29 ENCOUNTER — Ambulatory Visit
Admission: RE | Admit: 2021-09-29 | Discharge: 2021-09-29 | Disposition: A | Discharge status: Home / Self Care | Payer: Medicare HMO | Source: Ambulatory Visit | Attending: Oncology | Admitting: Oncology

## 2021-09-29 DIAGNOSIS — Z9481 Bone marrow transplant status: Secondary | ICD-10-CM | POA: Insufficient documentation

## 2021-09-29 DIAGNOSIS — Z856 Personal history of leukemia: Secondary | ICD-10-CM | POA: Diagnosis present

## 2021-09-29 DIAGNOSIS — Z08 Encounter for follow-up examination after completed treatment for malignant neoplasm: Secondary | ICD-10-CM | POA: Insufficient documentation

## 2021-09-29 HISTORY — DX: Candidal stomatitis: B37.0

## 2021-09-29 LAB — CBC WITH DIFFERENTIAL/PLATELET
Abs Immature Granulocytes: 0.02 10*3/uL (ref 0.00–0.07)
Basophils Absolute: 0 10*3/uL (ref 0.0–0.1)
Basophils Relative: 1 %
Eosinophils Absolute: 0.2 10*3/uL (ref 0.0–0.5)
Eosinophils Relative: 2 %
HCT: 38.5 % (ref 36.0–46.0)
Hemoglobin: 12.2 g/dL (ref 12.0–15.0)
Immature Granulocytes: 0 %
Lymphocytes Relative: 27 %
Lymphs Abs: 2.1 10*3/uL (ref 0.7–4.0)
MCH: 23.7 pg — ABNORMAL LOW (ref 26.0–34.0)
MCHC: 31.7 g/dL (ref 30.0–36.0)
MCV: 74.9 fL — ABNORMAL LOW (ref 80.0–100.0)
Monocytes Absolute: 0.5 10*3/uL (ref 0.1–1.0)
Monocytes Relative: 7 %
Neutro Abs: 4.8 10*3/uL (ref 1.7–7.7)
Neutrophils Relative %: 63 %
Platelets: 227 10*3/uL (ref 150–400)
RBC: 5.14 MIL/uL — ABNORMAL HIGH (ref 3.87–5.11)
RDW: 14.6 % (ref 11.5–15.5)
WBC: 7.6 10*3/uL (ref 4.0–10.5)
nRBC: 0 % (ref 0.0–0.2)

## 2021-09-29 MED ORDER — MIDAZOLAM HCL 2 MG/2ML IJ SOLN
INTRAMUSCULAR | Status: AC | PRN
  Administered 2021-09-29 (×2): 1 mg via INTRAVENOUS

## 2021-09-29 MED ORDER — FENTANYL CITRATE (PF) 100 MCG/2ML IJ SOLN
INTRAMUSCULAR | Status: AC
  Filled 2021-09-29: qty 2

## 2021-09-29 MED ORDER — FENTANYL CITRATE (PF) 100 MCG/2ML IJ SOLN
INTRAMUSCULAR | Status: AC | PRN
Start: 2021-09-29 — End: 2021-09-29
  Administered 2021-09-29 (×2): 50 ug via INTRAVENOUS

## 2021-09-29 MED ORDER — MIDAZOLAM HCL 2 MG/2ML IJ SOLN
INTRAMUSCULAR | Status: AC
  Filled 2021-09-29: qty 2

## 2021-09-29 MED ORDER — SODIUM CHLORIDE 0.9 % IV SOLN: INTRAVENOUS | Status: DC

## 2021-09-29 MED ORDER — HEPARIN SOD (PORK) LOCK FLUSH 100 UNIT/ML IV SOLN
INTRAVENOUS | Status: AC
  Filled 2021-09-29: qty 5

## 2021-09-29 NOTE — Procedures (Signed)
Procedure: CT core bone marrow biopsy R iliac EBL:   minimal Complications:  none immediate  See full dictation in [facility].  Dillard Cannon MD Main # [phone] Pager  [phone] Mobile [phone]

## 2021-09-29 NOTE — H&P (Signed)
Chief Complaint: Patient was seen in consultation today for history of AML at the request of Burns,Jennifer E  Referring Physician(s): Menard E  Supervising Physician: Arne Cleveland  Patient Status: [facility] - Out-pt  History of Present Illness: Roberta Curtis is a 81 y.o. 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 Tasia Catchings and is here for scheduled elective annual bone marrow biopsy for surveillance. She has underwent multiple bone marrow biopsy procedures in the past with no difficulty. She did take some aspirin this morning for short duration mid chest pain that radiated to the back she states she was not doing any activity when this occurred she denies any associated symptoms with pain such as shortness of breath, diaphoresis, nausea, arm pain, she states it went away on its own and denies any recurrent pain. She states she feels her chest pain was secondary to anxiety regarding the procedure. She currently denies any chest pain or shortness of breath. The patient denies any current chest pain or shortness of breath. The patient denies any history of sleep apnea or chronic oxygen use. She has no known complications to sedation.    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HCC)    GERD (gastroesophageal reflux disease)    Heart murmur    Osteopenia    Thrush     Past Surgical History:  Procedure Laterality Date   BONE MARROW TRANSPLANT     BREAST LUMPECTOMY Left    fatty tumor,1970's   CATARACT EXTRACTION     CESAREAN SECTION     HYSTEROTOMY     LYMPH GLAND EXCISION     MENISCUS REPAIR Right    NECK SURGERY     SALPINGECTOMY     WISDOM TOOTH EXTRACTION      Allergies: Jadenu [deferasirox], Levofloxacin, Ondansetron hcl, Codeine, Oxycodone-acetaminophen, and Penicillins  Medications: Prior to Admission medications   Medication Sig Start Date End Date  Taking? Authorizing Provider  albuterol (VENTOLIN HFA) 108 (90 Base) MCG/ACT inhaler Inhale 1-2 puffs into the lungs as needed for wheezing or shortness of breath. 02/13/20  Yes Lesleigh Noe, MD  aspirin 325 MG tablet Take 325 mg by mouth.   Yes [provider]  BIOTIN PO Take 1,000 mcg by mouth daily.    Yes [provider]  calcium carbonate (OS-CAL - DOSED IN MG OF ELEMENTAL CALCIUM) 1250 (500 Ca) MG tablet Take 1 tablet by mouth.   Yes [provider]  cyclobenzaprine (FLEXERIL) 5 MG tablet cyclobenzaprine 5 mg tablet  TAKE 1 TABLET BY MOUTH EVERY 8 HOURS AS NEEDED AT BEDTIME   Yes [provider]  esomeprazole (NEXIUM) 20 MG capsule TAKE 1 CAPSULE (20 MG TOTAL) BY MOUTH DAILY AT 12 NOON. 09/11/21  Yes Dugal, Tabitha, FNP  POTASSIUM PO Take 99 mg by mouth daily.    Yes [provider]  prochlorperazine (COMPAZINE) 10 MG tablet TAKE 1 TABLET BY MOUTH EVERY DAY 04/28/21  Yes Lesleigh Noe, MD  solifenacin (VESICARE) 5 MG tablet Take 1 tablet (5 mg total) by mouth daily. 09/25/21  Yes MacDiarmid, Nicki Reaper, MD  VITAMIN D PO Take 50,000 mcg by mouth daily.    Yes [provider]  ibandronate (BONIVA) 150 MG tablet TAKE 1 TABLET (150 MG TOTAL) BY MOUTH EVERY 30 (THIRTY) DAYS. 01/31/21   Lesleigh Noe, MD  triamcinolone cream (KENALOG) 0.1 % Apply 1 application topically 2 (  two) times daily. 07/09/21   Lesleigh Noe, MD     Family History  Problem Relation Age of Onset   Retinitis pigmentosa Mother    Stroke Mother    Liver cancer Father    Heart disease Father    Heart disease Brother    Blindness Brother     Social History   Socioeconomic History   Marital status: Widowed    Spouse name: Alvis Lemmings   Number of children: 3   Years of education: high school   Highest education level: Not on file  Occupational History   Not on file  Tobacco Use   Smoking status: Former    Packs/day: 1.00    Years: 6.00    Pack years: 6.00     Types: Cigarettes    Quit date: 02/20/1970    Years since quitting: 51.6   Smokeless tobacco: Never  Vaping Use   Vaping Use: Never used  Substance and Sexual Activity   Alcohol use: Not Currently   Drug use: Never   Sexual activity: Not Currently    Partners: Male    Birth control/protection: Post-menopausal  Other Topics Concern   Not on file  Social History Narrative      Right handed   Lives in a condo with husband   From [place]      02/12/20   From: [place], moved to be near [place]: with Alvis Lemmings - husband   Health Care Proxy: Teresita Madura (daughter)   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      Family: Product/process development scientist, lives in [place]), [place] ([place], [place]), [place] ([place])      Enjoys: meet new people, socialable      Exercise: not currently - cleaning   Diet: picky eater - eats veggies - but her husband doesn't like these things      Safety   Seat belts: Yes    Guns: Yes  and secure   Safe in relationships: Yes    Social Determinants of Health   Financial Resource Strain: Low Risk    Difficulty of Paying Living Expenses: Not hard at all  Food Insecurity: No Food Insecurity   Worried About Charity fundraiser in the Last Year: Never true   [place] in the Last Year: Never true  Transportation Needs: No Transportation Needs   Lack of Transportation (Medical): No   Lack of Transportation (Non-Medical): No  Physical Activity: Inactive   Days of Exercise per Week: 0 days   Minutes of Exercise per Session: 0 min  Stress: No Stress Concern Present   Feeling of Stress : Not at all  Social Connections: Not on file   Review of Systems: A 12 point ROS discussed and pertinent positives are indicated in the HPI above.  All other systems are negative.  Review of Systems  Vital Signs: BP 139/72    Pulse 71    Temp 97.7 F (36.5 C) (Oral)    Resp 17    Ht $R'5\' 1"'oE$  (1.549 m)    Wt 127 lb (57.6 kg)    SpO2 100%     BMI 24.00 kg/m   Physical Exam Constitutional:      Appearance: Normal appearance.  Cardiovascular:     Rate and Rhythm: Normal rate and regular rhythm.  Pulmonary:     Effort: Pulmonary effort is normal. No respiratory distress.     Breath sounds: Normal breath sounds.  Skin:    General: Skin is warm and dry.  Neurological:     Mental Status: She is alert and oriented to person, place, and time.    Imaging: No results found.  Labs:  CBC: Recent Labs    10/08/20 0746 04/01/21 1231 06/12/21 1231 09/29/21 0830  WBC 6.6 8.6 7.7 7.6  HGB 11.6* 11.2* 11.6* 12.2  HCT 36.2 34.9* 35.9* 38.5  PLT 244 204 224 227    COAGS: No results for input(s): INR, APTT in the last 8760 hours.  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 Tasia Catchings and is here for scheduled elective annual bone marrow biopsy for surveillance. She has underwent multiple bone marrow biopsy procedures in the past with no difficulty.  The patient has been NPO, no blood thinners taken, labs and vitals have been reviewed.  Risks and benefits of CT guided bone marrow biopsy with moderate sedation was discussed with the patient and/or patient's family including, but not limited to bleeding, infection, damage to adjacent structures or low yield requiring additional tests.  All of the questions were answered and there is agreement to proceed.  Consent signed and in chart.   Thank you for  this interesting consult.  I greatly enjoyed meeting Jeannia Tatro [facility] and look forward to participating in their care.  A copy of this report was sent to the requesting provider on this date.  Electronically Signed: Hedy Jacob, PA-C 09/29/2021, 8:50 AM   I spent a total of 15 Minutes in face to face in clinical consultation, greater than 50% of which was counseling/coordinating care for history of AML s/p bone marrow transplant in 2007

## 2021-10-03 LAB — SURGICAL PATHOLOGY

## 2021-10-06 ENCOUNTER — Other Ambulatory Visit: Payer: Self-pay

## 2021-10-06 ENCOUNTER — Inpatient Hospital Stay: Payer: Medicare HMO | Attending: Oncology

## 2021-10-06 DIAGNOSIS — Z856 Personal history of leukemia: Secondary | ICD-10-CM | POA: Insufficient documentation

## 2021-10-06 DIAGNOSIS — Z87891 Personal history of nicotine dependence: Secondary | ICD-10-CM | POA: Insufficient documentation

## 2021-10-06 LAB — COMPREHENSIVE METABOLIC PANEL
ALT: 12 U/L (ref 0–44)
AST: 19 U/L (ref 15–41)
Albumin: 3.6 g/dL (ref 3.5–5.0)
Alkaline Phosphatase: 56 U/L (ref 38–126)
Anion gap: 8 (ref 5–15)
BUN: 14 mg/dL (ref 8–23)
CO2: 26 mmol/L (ref 22–32)
Calcium: 9.1 mg/dL (ref 8.9–10.3)
Chloride: 103 mmol/L (ref 98–111)
Creatinine, Ser: 0.79 mg/dL (ref 0.44–1.00)
GFR, Estimated: >60 mL/min (ref >60)
Glucose, Bld: 142 mg/dL — ABNORMAL HIGH (ref 70–99)
Potassium: 3.1 mmol/L — ABNORMAL LOW (ref 3.5–5.1)
Sodium: 137 mmol/L (ref 135–145)
Total Bilirubin: 0.3 mg/dL (ref 0.3–1.2)
Total Protein: 7 g/dL (ref 6.5–8.1)

## 2021-10-06 LAB — CBC WITH DIFFERENTIAL/PLATELET
Abs Immature Granulocytes: 0.02 10*3/uL (ref 0.00–0.07)
Basophils Absolute: 0 10*3/uL (ref 0.0–0.1)
Basophils Relative: 1 %
Eosinophils Absolute: 0.1 10*3/uL (ref 0.0–0.5)
Eosinophils Relative: 2 %
HCT: 36.9 % (ref 36.0–46.0)
Hemoglobin: 12 g/dL (ref 12.0–15.0)
Immature Granulocytes: 0 %
Lymphocytes Relative: 32 %
Lymphs Abs: 2.6 10*3/uL (ref 0.7–4.0)
MCH: 24 pg — ABNORMAL LOW (ref 26.0–34.0)
MCHC: 32.5 g/dL (ref 30.0–36.0)
MCV: 73.8 fL — ABNORMAL LOW (ref 80.0–100.0)
Monocytes Absolute: 0.6 10*3/uL (ref 0.1–1.0)
Monocytes Relative: 8 %
Neutro Abs: 4.8 10*3/uL (ref 1.7–7.7)
Neutrophils Relative %: 57 %
Platelets: 228 10*3/uL (ref 150–400)
RBC: 5 MIL/uL (ref 3.87–5.11)
RDW: 15.2 % (ref 11.5–15.5)
WBC: 8.2 10*3/uL (ref 4.0–10.5)
nRBC: 0 % (ref 0.0–0.2)

## 2021-10-06 LAB — LACTATE DEHYDROGENASE: LDH: 135 U/L (ref 98–192)

## 2021-10-07 ENCOUNTER — Encounter (HOSPITAL_COMMUNITY): Payer: Self-pay | Admitting: Oncology

## 2021-10-07 LAB — SURGICAL PATHOLOGY

## 2021-10-08 ENCOUNTER — Inpatient Hospital Stay: Payer: Medicare HMO | Attending: Oncology | Admitting: Oncology

## 2021-10-08 ENCOUNTER — Other Ambulatory Visit: Payer: Self-pay

## 2021-10-08 ENCOUNTER — Encounter: Payer: Self-pay | Admitting: Oncology

## 2021-10-08 VITALS — BP 129/70 | HR 65 | Temp 96.7°F | Resp 18 | Wt 130.8 lb

## 2021-10-08 DIAGNOSIS — Z856 Personal history of leukemia: Secondary | ICD-10-CM

## 2021-10-08 DIAGNOSIS — Z87891 Personal history of nicotine dependence: Secondary | ICD-10-CM | POA: Insufficient documentation

## 2021-10-08 DIAGNOSIS — B372 Candidiasis of skin and nail: Secondary | ICD-10-CM | POA: Diagnosis not present

## 2021-10-08 DIAGNOSIS — Z9481 Bone marrow transplant status: Secondary | ICD-10-CM | POA: Insufficient documentation

## 2021-10-08 DIAGNOSIS — Z148 Genetic carrier of other disease: Secondary | ICD-10-CM | POA: Diagnosis not present

## 2021-10-08 LAB — COMP PANEL: LEUKEMIA/LYMPHOMA

## 2021-10-08 MED ORDER — POTASSIUM CHLORIDE CRYS ER 20 MEQ PO TBCR: 20.0000 meq | EXTENDED_RELEASE_TABLET | Freq: Every day | ORAL | 0 refills | Status: DC

## 2021-10-08 MED ORDER — NYSTATIN 100000 UNIT/GM EX POWD: 1.0000 "application " | Freq: Three times a day (TID) | CUTANEOUS | 0 refills | Status: DC

## 2021-10-08 NOTE — Progress Notes (Addendum)
Hematology/Oncology Progress note [phone] [phone]      Patient Care Team: Lesleigh Noe, MD as PCP - General (Family Medicine) Placido Sou (Oncology) [facility] (Cardiology) [facility] [place] Earlie Server, MD as Consulting Physician (Hematology and Oncology)  REFERRING PROVIDER: Lesleigh Noe, MD  CHIEF COMPLAINTS/REASON FOR VISIT:   history of AML status post bone marrow transplant, hemochromatosis carrier  HISTORY OF PRESENTING ILLNESS:   Roberta Curtis is a  81 y.o.  female with PMH listed below was seen in consultation at the request of  Lesleigh Noe, MD to establish care for  history of AML status post bone marrow transplant, hemochromatosis carrier. Patient moved from [place] to [place] in February 2021. She brought some of her oncology records. 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  Patient received induction daunorubincin and cytarabin.  04/18/2006- axilla mass exicision - microabscess with gram positive cocci.   04/19/2006, bone marrow biopsy showed 50% cellularity, chemotherapeutic effect, day 14.  Persistent leukemia given persistent  CD117 cells, CD34 positive cells are slightly increased. 04/28/2006, bone marrow biopsy showed 50 to 75% cellularity with immature infiltrates, mild maturation present, markedly decreased erythroid elements with dyserythropoiesis, marked megakaryocytosis with dysplastic morphology, increased iron stores  consistent with acute myeloid leukemia with multilineage dysplastic 05/28/2006 per note,  bone marrow which showed AML in remission 08/18/2006-admitted for bone marrow transplant, received busulfan and etoposide.  Received washed autologous peripheral blood stem cells on 08/27/2006.  Engraftment was noted on day 13-09/09/2006  Previously followed up with oncologist and has been having bone marrow biopsy surveillance annually and she has stayed in remission. .  Her last Bone marrow biopsy was done in 2019.  05/26/2018 Bone marrow biopsy showed normocellular marrow with adequate trilineage hematopoiesis, no morphology evidence of increase blasts, consistent with AML in remission.  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  Detail of mutation is unknown. She reports that she has had phlebotomy in the past. Reports she has had right axillary lymph node biopsy and was told by her doctors not to have blood drawn on the right upper extremity due to risk of lymphedema.  Patient tells me that her husband passed away from [facility] many years ago. Patient reports feeling very well today.Denies  fever, chills, fatigue, night sweats.  She does not eat very well and this has been a chronic issue for her.     INTERVAL HISTORY Roberta Curtis is a 81 y.o. female who has above history reviewed by me today presents for follow up visit for history of AML Patient reports feeling well.  Denies any constitutional symptoms. She has a small area of skin redness under her left breast.   Review of Systems  Constitutional:  Negative for appetite change, chills, fatigue and fever.  HENT:   Negative for hearing loss and voice change.   Eyes:  Negative for eye problems.  Respiratory:  Negative for chest tightness and cough.   Cardiovascular:  Negative for chest pain.  Gastrointestinal:  Negative  for  abdominal distention, abdominal pain and blood in stool.  Endocrine: Negative for hot flashes.  Genitourinary:  Negative for difficulty urinating and frequency.   Musculoskeletal:  Negative for arthralgias.  Skin:  Negative for itching and rash.  Neurological:  Negative for extremity weakness.  Hematological:  Negative for adenopathy.  Psychiatric/Behavioral:  Negative for confusion.    MEDICAL HISTORY:  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HCC)    GERD (gastroesophageal reflux disease)    Heart murmur    Osteopenia    Thrush     SURGICAL HISTORY: Past Surgical History:  Procedure Laterality Date   BONE MARROW TRANSPLANT     BREAST LUMPECTOMY Left    fatty tumor,1970's   CATARACT EXTRACTION     CESAREAN SECTION     HYSTEROTOMY     LYMPH GLAND EXCISION     MENISCUS REPAIR Right    NECK SURGERY     SALPINGECTOMY     WISDOM TOOTH EXTRACTION      SOCIAL HISTORY: Social History   Socioeconomic History   Marital status: Widowed    Spouse name: Alvis Lemmings   Number of children: 3   Years of education: high school   Highest education level: Not on file  Occupational History   Not on file  Tobacco Use   Smoking status: Former    Packs/day: 1.00    Years: 6.00    Pack years: 6.00    Types: Cigarettes    Quit date: 02/20/1970    Years since quitting: 51.6   Smokeless tobacco: Never  Vaping Use   Vaping Use: Never used  Substance and Sexual Activity   Alcohol use: Not Currently   Drug use: Never   Sexual activity: Not Currently    Partners: Male    Birth control/protection: Post-menopausal  Other Topics Concern   Not on file  Social History Narrative      Right handed   Lives in a condo with husband   From [place]      02/12/20   From: [place], moved [place] be near [place]: with Alvis Lemmings - husband   Health Care Proxy: Teresita Madura (daughter)   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       Family: Product/process development scientist, lives in [place]), [place] ([place], [place]), [place] ([place])      Enjoys: meet new people, socialable      Exercise: not currently - cleaning   Diet: picky eater - eats veggies - but her husband doesn't like these things      Safety   Seat belts: Yes    Guns: Yes  and secure   Safe in relationships: Yes    Social Determinants of Health   Financial Resource Strain: Low Risk    Difficulty of Paying Living Expenses: Not hard at all  Food Insecurity: No Food Insecurity   Worried About Charity fundraiser in the Last Year: Never true   Balm in the Last Year: Never true  Transportation Needs: No Transportation Needs   Lack of Transportation (Medical): No   Lack of Transportation (Non-Medical): No  Physical Activity: Inactive   Days of Exercise per Week: 0 days   Minutes of Exercise per Session: 0 min  Stress: No Stress Concern Present   Feeling of Stress : Not at all  Social Connections: Not on file  Intimate Partner Violence: Not At Risk   Fear  of Current or Ex-Partner: No   Emotionally Abused: No   Physically Abused: No   Sexually Abused: No    FAMILY HISTORY: Family History  Problem Relation Age of Onset   Retinitis pigmentosa Mother    Stroke Mother    Liver cancer Father    Heart disease Father    Heart disease Brother    Blindness Brother     ALLERGIES:  is allergic to jadenu [deferasirox], levofloxacin, ondansetron hcl, codeine, oxycodone-acetaminophen, and penicillins.  MEDICATIONS:  Current Outpatient Medications  Medication Sig Dispense Refill   albuterol (VENTOLIN HFA) 108 (90 Base) MCG/ACT inhaler Inhale 1-2 puffs into the lungs as needed for wheezing or shortness of breath. 6.7 g 1   aspirin 325 MG tablet Take 325 mg by mouth.     BIOTIN PO Take 1,000 mcg by mouth daily.      calcium carbonate (OS-CAL - DOSED IN MG OF ELEMENTAL CALCIUM) 1250 (500 Ca) MG tablet Take 1 tablet by mouth.     esomeprazole (NEXIUM) 20  MG capsule TAKE 1 CAPSULE (20 MG TOTAL) BY MOUTH DAILY AT 12 NOON. 90 capsule 1   ibandronate (BONIVA) 150 MG tablet TAKE 1 TABLET (150 MG TOTAL) BY MOUTH EVERY 30 (THIRTY) DAYS. 3 tablet 1   nystatin (MYCOSTATIN/NYSTOP) powder Apply 1 application topically 3 (three) times daily. 30 g 0   potassium chloride SA (KLOR-CON M) 20 MEQ tablet Take 1 tablet (20 mEq total) by mouth daily. 7 tablet 0   POTASSIUM PO Take 99 mg by mouth daily.      prochlorperazine (COMPAZINE) 10 MG tablet TAKE 1 TABLET BY MOUTH EVERY DAY 90 tablet 1   solifenacin (VESICARE) 5 MG tablet Take 1 tablet (5 mg total) by mouth daily. 30 tablet 11   VITAMIN D PO Take 50,000 mcg by mouth daily.      cyclobenzaprine (FLEXERIL) 5 MG tablet cyclobenzaprine 5 mg tablet  TAKE 1 TABLET BY MOUTH EVERY 8 HOURS AS NEEDED AT BEDTIME (Patient not taking: Reported on 10/08/2021)     triamcinolone cream (KENALOG) 0.1 % Apply 1 application topically 2 (two) times daily. (Patient not taking: Reported on 10/08/2021) 30 g 1   No current facility-administered medications for this visit.     PHYSICAL EXAMINATION: ECOG PERFORMANCE STATUS: 0 - Asymptomatic Vitals:   10/08/21 1401  BP: 129/70  Pulse: 65  Resp: 18  Temp: (!) 96.7 F (35.9 C)   Filed Weights   10/08/21 1401  Weight: 130 lb 12.8 oz (59.3 kg)    Physical Exam Constitutional:      General: She is not in acute distress. HENT:     Head: Normocephalic and atraumatic.  Eyes:     General: No scleral icterus. Cardiovascular:     Rate and Rhythm: Normal rate and regular rhythm.     Heart sounds: Normal heart sounds.  Pulmonary:     Effort: Pulmonary effort is normal. No respiratory distress.     Breath sounds: No wheezing.  Abdominal:     General: Bowel sounds are normal. There is no distension.     Palpations: Abdomen is soft.  Musculoskeletal:        General: No deformity. Normal range of motion.     Cervical back: Normal range of motion and neck supple.  Skin:     General: Skin is warm and dry.     Findings: No erythema or rash.  Neurological:     Mental Status: She is alert and oriented to  person, place, and time. Mental status is at baseline.     Cranial Nerves: No cranial nerve deficit.     Coordination: Coordination normal.  Psychiatric:        Mood and Affect: Mood normal.    LABORATORY DATA:  I have reviewed the data as listed Lab Results  Component Value Date   WBC 8.2 10/06/2021   HGB 12.0 10/06/2021   HCT 36.9 10/06/2021   MCV 73.8 (L) 10/06/2021   PLT 228 10/06/2021   Recent Labs    04/01/21 1231 06/12/21 1231 10/06/21 1358  NA 138 138 137  K 4.0 3.4* 3.1*  CL 106 103 103  CO2 _0 GLUCOSE 97 89 142*  BUN _1 CREATININE 0.92 0.91 0.79  CALCIUM 9.2 10.1 9.1  GFRNONAA >60 >60 >60  PROT 7.0 6.5 7.0  ALBUMIN 4.1 3.7 3.6  AST _2 ALT _3 ALKPHOS 51 46 56  BILITOT 0.6 0.7 0.3  BILIDIR  --  <0.1  --   IBILI  --  NOT CALCULATED  --     Iron/TIBC/Ferritin/ %Sat    Component Value Date/Time   IRON 125 09/04/2020 1302   IRON 108 08/06/2019 0000   TIBC 364 09/04/2020 1302   TIBC 324 08/06/2019 0000   FERRITIN 52 09/04/2020 1302   FERRITIN 74 08/06/2019 0000   IRONPCTSAT 34 (H) 09/04/2020 1302   IRONPCTSAT 33 08/06/2019 0000      RADIOGRAPHIC STUDIES: I have personally reviewed the radiological images as listed and agreed with the findings in the report. CT BONE MARROW BIOPSY & ASPIRATION  Result Date: 09/29/2021 CLINICAL DATA:  History of acute myeloblastic leukemia, follow-up EXAM: CT GUIDED DEEP ILIAC BONE ASPIRATION AND CORE BIOPSY TECHNIQUE: Patient was placed prone on the CT gantry and limited axial scans through the pelvis were obtained. This exam was performed according to the departmental dose-optimization program which includes automated exposure control, adjustment of the mA and/or kV according to patient size and/or use of iterative reconstruction technique. Appropriate skin entry  site was identified. Skin site was marked, prepped with chlorhexidine, draped in usual sterile fashion, and infiltrated locally with 1% lidocaine. Intravenous Fentanyl 118mg and Versed 254mwere administered as conscious sedation during continuous monitoring of the patient's level of consciousness and physiological / cardiorespiratory status by the radiology RN, with a total moderate sedation time of 15 minutes. Under CT fluoroscopic guidance an 11-gauge Cook trocar bone needle was advanced into the right iliac bone just lateral to the sacroiliac joint. Once needle tip position was confirmed, core and aspiration samples were obtained, submitted to pathology for approval. Patient tolerated procedure well. COMPLICATIONS: COMPLICATIONS none IMPRESSION: 1. Technically successful CT guided right iliac bone core and aspiration biopsy. Electronically Signed   By: D Lucrezia Europe.D.   On: 09/29/2021 11:08       ASSESSMENT & PLAN:  1. History of leukemia   2. Hemochromatosis carrier   3. Skin candidiasis    #History of AML in 2007, status post bone marrow transplant. Labs reviewed and discussed with patient.  Negative peripheral flow cytometry. Bone marrow biopsy results were reviewed and discussed with patient. No leukemia, lymphoma or high-grade dysplasia.  Cytogenetics were normal. [2019 bone marrow biopsy showed persistent deletion of chromosome 20 and therefore he has been getting annual bone marrow biopsy.] We discussed about repeating another bone marrow biopsy in 1 year.  If still negative, we will stop repeating annual marrow  biopsy.  She agrees with the plan. [name]  #Hemochromatosis carrier, H63D.  Ferritin level is stable at 52, iron saturation 34.  No LFT abnormalities.  Continue monitor.  Check iron panel at the next visit. Recommend first-degree relative to be screened.  # Skin candidiasis, recommend nystatin powder. Rx sent.   Orders Placed This Encounter  Procedures   CBC with  Differential/Platelet    Standing Status:   Future    Standing Expiration Date:   10/08/2022   Comprehensive metabolic panel    Standing Status:   Future    Standing Expiration Date:   10/08/2022   Lactate dehydrogenase    Standing Status:   Future    Standing Expiration Date:   10/08/2022    All questions were answered. The patient knows to call the clinic with any problems questions or concerns.  cc Lesleigh Noe, MD    Return of visit:  6 months.   Earlie Server, MD, PhD Hematology Oncology 10/08/2021

## 2021-10-08 NOTE — Progress Notes (Signed)
Pt here for follow up. She reports that she has a rash under left breast that has been there for a couple of weeks. Pt has had 13# weight loss since she was seen last year, pt reports she has a poor appetite.

## 2021-10-08 NOTE — Addendum Note (Signed)
Addended by: Earlie Server on: 10/08/2021 10:57 PM   Modules accepted: Orders

## 2021-10-09 ENCOUNTER — Telehealth: Payer: Self-pay | Admitting: Family Medicine

## 2021-10-09 NOTE — Telephone Encounter (Signed)
Roberta Curtis called in and stated that she went to her urologist and they took her blood and they saw that her potassium was low and they wanted to have her potassium in 2 weeks and they she has an apt with Dr. Einar Pheasant on 3/1. She put her on klor-con 20mg  for a week and she picked it up today. Wanted to know if she can get an order to get them done.

## 2021-10-10 ENCOUNTER — Telehealth: Payer: Self-pay | Admitting: Family Medicine

## 2021-10-10 ENCOUNTER — Other Ambulatory Visit: Payer: Self-pay | Admitting: Nurse Practitioner

## 2021-10-10 ENCOUNTER — Telehealth: Payer: Self-pay | Admitting: *Deleted

## 2021-10-10 DIAGNOSIS — E876 Hypokalemia: Secondary | ICD-10-CM

## 2021-10-10 NOTE — Telephone Encounter (Signed)
Saw her potassium was 3.1 they started her on a weeks worth of potassium. Since we are closed on 10/15/2021 she needs it rechecked on 10/14/2021. This can be at [facility] or at [facility], which ever she prefers. I have placed the order in for future

## 2021-10-10 NOTE — Telephone Encounter (Signed)
Called pt and spoke to Mr. Worst and he realted the message, he stated that he is going to have her call back.

## 2021-10-10 NOTE — Telephone Encounter (Signed)
Elizabeth with the [facility] called stating that pt need a Potassium check. Please advise.

## 2021-10-10 NOTE — Telephone Encounter (Signed)
Patient called stating that she was told to have her K= level checked and to get in touch with her PCP for this. She said she was given an order for [facility] which she does not use and her PCP Dr Einar Pheasant told her to call our office to get the lab drawn on the 15th. Please schedule lab appointment if approved

## 2021-10-10 NOTE — Telephone Encounter (Signed)
Contacted PCP's office and spoke to [place]. Pt needs follow up for potassium recheck and furhter management of low K levels. They will relay message to Dr. Einar Pheasant and see if she needs to be seen prior to labs. Spoke to pt's husband, Legrand Como, and informed him that I called PCP and they should expect a call from their office.

## 2021-10-13 NOTE — Telephone Encounter (Signed)
Patient has a lab on 10/23/21

## 2021-10-14 ENCOUNTER — Other Ambulatory Visit: Payer: Medicare HMO

## 2021-10-14 ENCOUNTER — Telehealth: Payer: Self-pay | Admitting: Family Medicine

## 2021-10-14 MED ORDER — TOLTERODINE TARTRATE ER 4 MG PO CP24: 4.0000 mg | ORAL_CAPSULE | Freq: Every day | ORAL | 11 refills | Status: DC

## 2021-10-14 NOTE — Telephone Encounter (Signed)
Patient called and states she was given Vesicare and the urinating symptoms have gotten significantly worse. She states urine is just running out. She can't control her bladder at all. Previously she was on Oxybutynin 15mg  and even with the increase in dosage. She is requesting a different medication to try.

## 2021-10-14 NOTE — Telephone Encounter (Signed)
Bjorn Loser, MD  Kyra Manges, CMA Caller: Unspecified (Today,  9:59 AM) She can go back on the Myrbetriq 50 mg because it worked before but it was quite expensive 30x11   Otherwise she can try Detrol LA 4 mg 30x11  Patient notified the medication has been changed and sent to the pharmacy.

## 2021-10-14 NOTE — Telephone Encounter (Signed)
Called pt and she stated that Dr. Tasia Catchings the oncologist stated that she wanted to have her labs recehecked in 2-3 weeks and she prescribed medicine and to have them rechecked. Dr. Tasia Catchings number is [phone]. But she stated if she needed to get them rechecked earlier then she will.

## 2021-10-14 NOTE — Telephone Encounter (Signed)
Please f/u with patient. Looks like her lab appt is not until 2/16 but Romilda Garret recommended earlier appointment.

## 2021-10-16 NOTE — Telephone Encounter (Signed)
Ok to delay the labs per Dr. Collie Siad timing

## 2021-10-23 ENCOUNTER — Other Ambulatory Visit: Payer: Self-pay

## 2021-10-23 ENCOUNTER — Other Ambulatory Visit (INDEPENDENT_AMBULATORY_CARE_PROVIDER_SITE_OTHER): Payer: Medicare HMO

## 2021-10-23 DIAGNOSIS — E876 Hypokalemia: Secondary | ICD-10-CM

## 2021-10-23 LAB — BASIC METABOLIC PANEL
BUN: 14 mg/dL (ref 6–23)
CO2: 30 mEq/L (ref 19–32)
Calcium: 9.5 mg/dL (ref 8.4–10.5)
Chloride: 106 mEq/L (ref 96–112)
Creatinine, Ser: 0.83 mg/dL (ref 0.40–1.20)
GFR: 66.95 mL/min (ref >60.00)
Glucose, Bld: 111 mg/dL — ABNORMAL HIGH (ref 70–99)
Potassium: 4.4 mEq/L (ref 3.5–5.1)
Sodium: 140 mEq/L (ref 135–145)

## 2021-10-24 ENCOUNTER — Ambulatory Visit
Admission: RE | Admit: 2021-10-24 | Discharge: 2021-10-24 | Disposition: A | Discharge status: Home / Self Care | Payer: Medicare HMO | Source: Ambulatory Visit | Attending: Family Medicine | Admitting: Family Medicine

## 2021-10-24 ENCOUNTER — Telehealth: Payer: Self-pay

## 2021-10-24 VITALS — BP 120/80 | HR 87 | Temp 98.4°F | Resp 16

## 2021-10-24 DIAGNOSIS — B372 Candidiasis of skin and nail: Secondary | ICD-10-CM

## 2021-10-24 MED ORDER — NYSTATIN 100000 UNIT/GM EX CREA: TOPICAL_CREAM | CUTANEOUS | 0 refills | Status: DC

## 2021-10-24 MED ORDER — FLUCONAZOLE 150 MG PO TABS: 150.0000 mg | ORAL_TABLET | Freq: Every day | ORAL | 0 refills | Status: AC

## 2021-10-24 NOTE — ED Provider Notes (Signed)
[facility]    CSN: [id number] Arrival date & time: 10/24/21  1341      History   Chief Complaint Chief Complaint  Patient presents with   Rash    HPI Roberta Curtis is a 81 y.o. female.   HPI Patient presents today for evaluation of yeast infection underneath both breast. Rash has been present for one month. Patients provider attempted to treat rash with one course of nystatin powder and the rash subsequently worsened. The rash is pruritic and scaly.  Patient is not a diabetic. History of thrush. She is in remission of leukemia.  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   S/P bone marrow transplant ([place]) 02/12/2020   Osteopenia 02/12/2020   GERD (gastroesophageal reflux disease) 02/12/2020   Mitral valve regurgitation 02/12/2020   Prediabetes 02/12/2020   CKD (chronic kidney disease) stage 2, 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      Home Medications    Prior to Admission medications   Medication Sig Start Date End Date Taking? Authorizing Provider  fluconazole (DIFLUCAN) 150 MG tablet Take 1  tablet (150 mg total) by mouth daily for 5 days. 10/24/21 10/29/21 Yes Scot Jun, FNP  nystatin cream (MYCOSTATIN) Apply to affected area 2 times daily 10/24/21  Yes Scot Jun, FNP  albuterol (VENTOLIN HFA) 108 (90 Base) MCG/ACT inhaler Inhale 1-2 puffs into the lungs as needed for wheezing or shortness of breath. 02/13/20   Lesleigh Noe, MD  aspirin 325 MG tablet Take 325 mg by mouth.    [provider]  BIOTIN PO Take 1,000 mcg by mouth daily.     [provider]  calcium carbonate (OS-CAL - DOSED IN MG OF ELEMENTAL CALCIUM) 1250 (500 Ca) MG tablet Take 1 tablet by mouth.    [provider]  cyclobenzaprine (FLEXERIL) 5 MG tablet cyclobenzaprine 5 mg tablet  TAKE 1 TABLET BY MOUTH EVERY 8 HOURS AS NEEDED AT BEDTIME Patient not taking: Reported on 10/08/2021    [provider]  esomeprazole (NEXIUM) 20 MG capsule TAKE 1 CAPSULE (20 MG TOTAL) BY MOUTH DAILY AT 12 NOON. 09/11/21   Dugal, Tabitha, FNP  ibandronate (BONIVA) 150 MG tablet TAKE 1 TABLET (150 MG TOTAL) BY MOUTH EVERY 30 (THIRTY) DAYS. 01/31/21   Lesleigh Noe, MD  nystatin (MYCOSTATIN/NYSTOP) powder Apply 1 application topically 3 (three) times daily. 10/08/21   Earlie Server, MD  potassium chloride SA (  KLOR-CON M) 20 MEQ tablet Take 1 tablet (20 mEq total) by mouth daily. 10/08/21   Earlie Server, MD  POTASSIUM PO Take 99 mg by mouth daily.     [provider]  prochlorperazine (COMPAZINE) 10 MG tablet TAKE 1 TABLET BY MOUTH EVERY DAY 04/28/21   Lesleigh Noe, MD  tolterodine (DETROL LA) 4 MG 24 hr capsule Take 1 capsule (4 mg total) by mouth daily. 10/14/21   Bjorn Loser, MD  triamcinolone cream (KENALOG) 0.1 % Apply 1 application topically 2 (two) times daily. Patient not taking: Reported on 10/08/2021 07/09/21   Lesleigh Noe, MD  VITAMIN D PO Take 50,000 mcg by mouth daily.     [provider]    Family History Family History  Problem Relation Age of Onset   Retinitis  pigmentosa Mother    Stroke Mother    Liver cancer Father    Heart disease Father    Heart disease Brother    Blindness Brother     Social History Social History   Tobacco Use   Smoking status: Former    Packs/day: 1.00    Years: 6.00    Pack years: 6.00    Types: Cigarettes    Quit date: 02/20/1970    Years since quitting: 51.7   Smokeless tobacco: Never  Vaping Use   Vaping Use: Never used  Substance Use Topics   Alcohol use: Not Currently   Drug use: Never     Allergies   Jadenu [deferasirox], Levofloxacin, Ondansetron hcl, Codeine, Oxycodone-acetaminophen, and Penicillins   Review of Systems Review of Systems Pertinent negatives listed in HPI    Physical Exam Triage Vital Signs ED Triage Vitals  Enc Vitals Group     BP 10/24/21 1420 120/80     Pulse Rate 10/24/21 1420 87     Resp 10/24/21 1420 16     Temp 10/24/21 1420 98.4 F (36.9 C)     Temp Source 10/24/21 1420 Oral     SpO2 10/24/21 1420 97 %     Weight --      Height --      Head Circumference --      Peak Flow --      Pain Score 10/24/21 1424 5     Pain Loc --      Pain Edu? --      Excl. in [place]? --    No data found.  Updated Vital Signs BP 120/80 (BP Location: Left Arm)    Pulse 87    Temp 98.4 F (36.9 C) (Oral)    Resp 16    SpO2 97%   Visual Acuity Right Eye Distance:   Left Eye Distance:   Bilateral Distance:    Right Eye Near:   Left Eye Near:    Bilateral Near:     Physical Exam Constitutional:      Appearance: Normal appearance.  HENT:     Head: Normocephalic and atraumatic.  Cardiovascular:     Rate and Rhythm: Normal rate and regular rhythm.  Pulmonary:     Effort: Pulmonary effort is normal.     Breath sounds: Normal breath sounds.  Skin:    Findings: Erythema and rash present.     Comments: Rash under bilateral breast   Neurological:     General: No focal deficit present.     Mental Status: She is alert and oriented to person, place, and time.  Psychiatric:  Mood and Affect: Mood normal.        Behavior: Behavior normal.        Thought Content: Thought content normal.        Judgment: Judgment normal.     UC Treatments / Results  Labs (all labs ordered are listed, but only abnormal results are displayed) Labs Reviewed - No data to display  EKG   Radiology No results found.  Procedures Procedures (including critical care time)  Medications Ordered in UC Medications - No data to display  Initial Impression / Assessment and Plan / UC Course  I have reviewed the triage vital signs and the nursing notes.  Pertinent labs & imaging results that were available during my care of the patient were reviewed by me and considered in my medical decision making (see chart for details).    Interigo, Candidiasis  Treatment wit Nystatin cream and Diflucan 150 mg x 5 days. Discontinue use of powder until rash completely resolves. Return if symptoms do not completely resolve. Final Clinical Impressions(s) / UC Diagnoses   Final diagnoses:  Candidiasis, intertrigo   Discharge Instructions   None    ED Prescriptions     Medication Sig Dispense Auth. Provider   fluconazole (DIFLUCAN) 150 MG tablet Take 1 tablet (150 mg total) by mouth daily for 5 days. 5 tablet Scot Jun, FNP   nystatin cream (MYCOSTATIN) Apply to affected area 2 times daily 60 g Scot Jun, FNP      PDMP not reviewed this encounter.   Scot Jun, FNP 10/24/21 [id number]

## 2021-10-24 NOTE — ED Triage Notes (Signed)
Pt here with painful rash under both breasts x 1 month. Treated for yeast infection with Nystatin powder with no relief.

## 2021-10-24 NOTE — Telephone Encounter (Signed)
Noted agree with need for evaluation

## 2021-10-24 NOTE — Telephone Encounter (Signed)
[place] Night - Client TELEPHONE ADVICE RECORD [facility] Patient Name: [facility] AN Maude Leriche Gender: Female DOB: 1941-07-08 Age: 81 Y 85 M 18 D Return Phone Number: [phone] (Primary) Address: City/ State/ Zip: [place]  [address] Client [facility] Night - Client Client Site [place] Provider Waunita Schooner- MD Contact Type Call Who Is Calling Patient / Member / Family / Caregiver Call Type Triage / Clinical Relationship To Patient Self Return Phone Number [phone] (Primary) Chief Complaint Rash - Localized Reason for Call Symptomatic / Request for [place] has a yeast infection under both breast and next appt is not until 03/01, she's been prescribe medication but its gotten worse and its raw Translation No Nurse Assessment Nurse: Lucky Cowboy, RN, Levada Dy Date/Time (Eastern Time): 10/24/2021 8:11:40 AM Confirm and document reason for call. If symptomatic, describe symptoms. ---Caller stated that she has a yeast infection under her breasts on 2/1. She was ordered Nystatin powder TID. She stated that the rash is smelling. Does the patient have any new or worsening symptoms? ---Yes Will a triage be completed? ---Yes Related visit to physician within the last 2 weeks? ---No Does the PT have any chronic conditions? (i.e. diabetes, asthma, this includes High risk factors for pregnancy, etc.) ---Yes List chronic conditions. ---leukemia (in remission), osteoporosis, GERD, Vitamin D Is this a behavioral health or substance abuse call? ---No Guidelines Guideline Title Affirmed Question Affirmed Notes Nurse Date/Time (Eastern Time) Rash or Redness - Widespread SEVERE itching (i.e., interferes with sleep, normal activities or school) Lucky Cowboy, RN, Levada Dy 10/24/2021 8:16:53 AM Disp. Time Eilene Ghazi Time) Disposition Final User 10/24/2021 8:22:12 AM See PCP within  24 Hours Yes Dew, RN, Levada Dy PLEASE NOTE: All timestamps contained within this report are represented as [place] Standard Time. CONFIDENTIALTY NOTICE: This fax transmission is intended only for the addressee. It contains information that is legally privileged, confidential or otherwise protected from use or disclosure. If you are not the intended recipient, you are strictly prohibited from reviewing, disclosing, copying using or disseminating any of this information or taking any action in reliance on or regarding this information. If you have received this fax in error, please notify us immediately by telephone so that we can arrange for its return to [place]. Phone: [phone], Toll-Free: [phone], Fax: [phone] Page: 2 of 2 Call Id: [id number] Caller Disagree/Comply Comply Caller Understands Yes PreDisposition Call Doctor Care Advice Given Per Guideline SEE PCP WITHIN 24 HOURS: * IF OFFICE WILL BE OPEN: You need to be examined within the next 24 hours. Call your doctor (or NP/PA) when the office opens and make an appointment. * Bathe for 15 to 20 minutes, 1 to 2 times daily. * Pat dry using towel - do not rub. CALL BACK IF: * Rash becomes purple or blood-colored or blister-like * Fever occurs * You become worse CARE ADVICE given per Rash - Widespread and Cause Unknown (Adult) guideline. Referrals REFERRED TO PCP OFFIC

## 2021-10-24 NOTE — Telephone Encounter (Signed)
I spoke with pt; pt said oncologist had given pt nystatin powder and rash is spreading and rash is worse and smelling and itchy. No available appts at [facility] and I scheduled pt an appt at [facility] in [facility] 10/24/21 at 2 PM. Sending note to Dr Einar Pheasant as Juluis Rainier.

## 2021-10-31 ENCOUNTER — Ambulatory Visit
Admission: EM | Admit: 2021-10-31 | Discharge: 2021-10-31 | Disposition: A | Discharge status: Home / Self Care | Payer: Medicare HMO | Attending: Student | Admitting: Student

## 2021-10-31 ENCOUNTER — Encounter: Payer: Self-pay | Admitting: Emergency Medicine

## 2021-10-31 ENCOUNTER — Other Ambulatory Visit: Payer: Self-pay

## 2021-10-31 DIAGNOSIS — L0100 Impetigo, unspecified: Secondary | ICD-10-CM | POA: Diagnosis not present

## 2021-10-31 MED ORDER — MUPIROCIN CALCIUM 2 % EX CREA: 1.0000 "application " | TOPICAL_CREAM | Freq: Two times a day (BID) | CUTANEOUS | 0 refills | Status: AC

## 2021-10-31 MED ORDER — FLUCONAZOLE 150 MG PO TABS: 150.0000 mg | ORAL_TABLET | Freq: Every day | ORAL | 0 refills | Status: AC

## 2021-10-31 NOTE — ED Triage Notes (Signed)
Pt presents with a rash underneath her breast. She was seen last week but it is getting worse.

## 2021-10-31 NOTE — ED Provider Notes (Signed)
[facility]    CSN: [id number] Arrival date & time: 10/31/21  1100      History   Chief Complaint Chief Complaint  Patient presents with   Rash    HPI Roberta Curtis is a 81 y.o. female presenting with continued rash below her breasts. History thrush, leukemia in remission. Describes itchy rash below her breasts with few lesions between them. Has completed the nystatin and diflucan as prescribed last visit without relief. States she has been told this is yeast by multiple providers. No issues like this before.  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/12/2020   S/P bone marrow transplant ([place]) 02/12/2020   Osteopenia 02/12/2020   GERD (gastroesophageal reflux disease) 02/12/2020   Mitral valve regurgitation 02/12/2020   Prediabetes 02/12/2020   CKD (chronic kidney disease) stage 2, 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      Home Medications    Prior to Admission medications   Medication Sig Start Date End Date Taking? Authorizing Provider  fluconazole (DIFLUCAN) 150 MG tablet Take 1 tablet (150 mg  total) by mouth daily for 3 days. 10/31/21 11/03/21 Yes Hazel Sams, PA-C  mupirocin cream (BACTROBAN) 2 % Apply 1 application topically 2 (two) times daily for 7 days. 10/31/21 11/07/21 Yes Hazel Sams, PA-C  albuterol (VENTOLIN HFA) 108 (90 Base) MCG/ACT inhaler Inhale 1-2 puffs into the lungs as needed for wheezing or shortness of breath. 02/13/20   Lesleigh Noe, MD  aspirin 325 MG tablet Take 325 mg by mouth.    [provider]  BIOTIN PO Take 1,000 mcg by mouth daily.     [provider]  calcium carbonate (OS-CAL - DOSED IN MG OF ELEMENTAL CALCIUM) 1250 (500 Ca) MG tablet Take 1 tablet by mouth.    [provider]  cyclobenzaprine (FLEXERIL) 5 MG tablet cyclobenzaprine 5 mg tablet  TAKE 1 TABLET BY MOUTH EVERY 8 HOURS AS NEEDED AT BEDTIME Patient not taking: Reported on 10/08/2021    [provider]  esomeprazole (NEXIUM) 20 MG capsule TAKE 1 CAPSULE (20 MG TOTAL) BY MOUTH DAILY AT 12 NOON. 09/11/21   Dugal, Tabitha, FNP  ibandronate (BONIVA) 150 MG tablet TAKE 1 TABLET (150 MG TOTAL) BY MOUTH EVERY 30 (THIRTY) DAYS. 01/31/21   Lesleigh Noe, MD  nystatin (MYCOSTATIN/NYSTOP) powder Apply 1 application topically 3 (three) times daily. 10/08/21   Earlie Server, MD  nystatin cream (MYCOSTATIN)  Apply to affected area 2 times daily 10/24/21   Scot Jun, FNP  potassium chloride SA (KLOR-CON M) 20 MEQ tablet Take 1 tablet (20 mEq total) by mouth daily. 10/08/21   Earlie Server, MD  POTASSIUM PO Take 99 mg by mouth daily.     [provider]  prochlorperazine (COMPAZINE) 10 MG tablet TAKE 1 TABLET BY MOUTH EVERY DAY 04/28/21   Lesleigh Noe, MD  tolterodine (DETROL LA) 4 MG 24 hr capsule Take 1 capsule (4 mg total) by mouth daily. 10/14/21   Bjorn Loser, MD  triamcinolone cream (KENALOG) 0.1 % Apply 1 application topically 2 (two) times daily. Patient not taking: Reported on 10/08/2021 07/09/21   Lesleigh Noe, MD  VITAMIN D PO Take 50,000 mcg by mouth  daily.     [provider]    Family History Family History  Problem Relation Age of Onset   Retinitis pigmentosa Mother    Stroke Mother    Liver cancer Father    Heart disease Father    Heart disease Brother    Blindness Brother     Social History Social History   Tobacco Use   Smoking status: Former    Packs/day: 1.00    Years: 6.00    Pack years: 6.00    Types: Cigarettes    Quit date: 02/20/1970    Years since quitting: 51.7   Smokeless tobacco: Never  Vaping Use   Vaping Use: Never used  Substance Use Topics   Alcohol use: Not Currently   Drug use: Never     Allergies   Jadenu [deferasirox], Levofloxacin, Ondansetron hcl, Codeine, Oxycodone-acetaminophen, and Penicillins   Review of Systems Review of Systems  Skin:  Positive for rash.    Physical Exam Triage Vital Signs ED Triage Vitals  Enc Vitals Group     BP 10/31/21 1109 130/78     Pulse Rate 10/31/21 1109 98     Resp 10/31/21 1109 18     Temp 10/31/21 1109 98.6 F (37 C)     Temp src --      SpO2 10/31/21 1109 98 %     Weight --      Height --      Head Circumference --      Peak Flow --      Pain Score 10/31/21 1111 0     Pain Loc --      Pain Edu? --      Excl. in [place]? --    No data found.  Updated Vital Signs BP 130/78    Pulse 98    Temp 98.6 F (37 C)    Resp 18    SpO2 98%   Visual Acuity Right Eye Distance:   Left Eye Distance:   Bilateral Distance:    Right Eye Near:   Left Eye Near:    Bilateral Near:     Physical Exam Vitals reviewed.  Constitutional:      General: She is not in acute distress.    Appearance: Normal appearance. She is not ill-appearing.  HENT:     Head: Normocephalic and atraumatic.  Pulmonary:     Effort: Pulmonary effort is normal.  Chest:     Comments: Bilateral breasts with beefy erythematous plaques below. Also with few satellite lesions between breasts. Warm. Nontender.  Neurological:     General: No focal deficit present.      Mental Status: She is alert and oriented to person, place, and  time.  Psychiatric:        Mood and Affect: Mood normal.        Behavior: Behavior normal.        Thought Content: Thought content normal.        Judgment: Judgment normal.     UC Treatments / Results  Labs (all labs ordered are listed, but only abnormal results are displayed) Labs Reviewed - No data to display  EKG   Radiology No results found.  Procedures Procedures (including critical care time)  Medications Ordered in UC Medications - No data to display  Initial Impression / Assessment and Plan / UC Course  I have reviewed the triage vital signs and the nursing notes.  Pertinent labs & imaging results that were available during my care of the patient were reviewed by me and considered in my medical decision making (see chart for details).     This patient is a very pleasant 81 y.o. year old female presenting with impetigo below breasts. Afebrile, nontachy. This was managed 10/24/21 with diflucan and nystatin without improvement. Will manage as impetigo today with mupirocin. Also sent diflucan x3 tabs. F/u with PCP 3/1 as scheduled. She does have a dermatologist; this would be appropriate follow-up. ED return precautions discussed. Patient verbalizes understanding and agreement.     Final Clinical Impressions(s) / UC Diagnoses   Final diagnoses:  Impetigo     Discharge Instructions      -Mupirocin twice daily for about 7 days -Diflucan once daily for 3 days -Try to keep the area dry -Follow-up with PCP on 3/1 as scheduled. Ideally they would help you get in with a dermatologist.   ED Prescriptions     Medication Sig Dispense Auth. Provider   mupirocin cream (BACTROBAN) 2 % Apply 1 application topically 2 (two) times daily for 7 days. 15 g Hazel Sams, PA-C   fluconazole (DIFLUCAN) 150 MG tablet Take 1 tablet (150 mg total) by mouth daily for 3 days. 3 tablet Hazel Sams, PA-C      PDMP  not reviewed this encounter.   Hazel Sams, PA-C 10/31/21 1143

## 2021-10-31 NOTE — Discharge Instructions (Addendum)
-  Mupirocin twice daily for about 7 days -Diflucan once daily for 3 days -Try to keep the area dry -Follow-up with PCP on 3/1 as scheduled. Ideally they would help you get in with a dermatologist.

## 2021-11-05 ENCOUNTER — Other Ambulatory Visit: Payer: Self-pay

## 2021-11-05 ENCOUNTER — Ambulatory Visit (INDEPENDENT_AMBULATORY_CARE_PROVIDER_SITE_OTHER): Payer: Medicare HMO | Admitting: Family Medicine

## 2021-11-05 VITALS — BP 130/80 | HR 68 | Temp 97.9°F | Ht 61.25 in | Wt 130.3 lb

## 2021-11-05 DIAGNOSIS — R7303 Prediabetes: Secondary | ICD-10-CM

## 2021-11-05 DIAGNOSIS — G629 Polyneuropathy, unspecified: Secondary | ICD-10-CM | POA: Diagnosis not present

## 2021-11-05 DIAGNOSIS — B379 Candidiasis, unspecified: Secondary | ICD-10-CM | POA: Insufficient documentation

## 2021-11-05 DIAGNOSIS — C9201 Acute myeloblastic leukemia, in remission: Secondary | ICD-10-CM

## 2021-11-05 DIAGNOSIS — R110 Nausea: Secondary | ICD-10-CM

## 2021-11-05 DIAGNOSIS — Z9481 Bone marrow transplant status: Secondary | ICD-10-CM

## 2021-11-05 LAB — POCT GLYCOSYLATED HEMOGLOBIN (HGB A1C): Hemoglobin A1C: 6 % — AB (ref 4.0–5.6)

## 2021-11-05 MED ORDER — PROCHLORPERAZINE MALEATE 10 MG PO TABS: 10.0000 mg | ORAL_TABLET | Freq: Every day | ORAL | 1 refills | Status: DC

## 2021-11-05 NOTE — Assessment & Plan Note (Signed)
Stable, refill compazine for prn use ?

## 2021-11-05 NOTE — Assessment & Plan Note (Signed)
Following with Dr. Tasia Catchings, most recent biopsy was normal.  Appreciate Dr. Tasia Catchings support. ?

## 2021-11-05 NOTE — Assessment & Plan Note (Signed)
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 Needs handicap sticker renewed ?

## 2021-11-05 NOTE — Patient Instructions (Addendum)
Your screen for diabetes shows that you have prediabetes. This means that you are at risk for developing diabetes.  ? ?You can slow the progression to diabetes through healthy diet and exercise.  ? ?Eat healthy foods ?Get at least 150 minutes of moderate aerobic physical activity a week, or about 30 minutes on most days of the week ?Avoid weight gain ?Control your blood pressure and cholesterol ? ? ?We should repeat this test next year to monitor for changes.  ? ?

## 2021-11-05 NOTE — Assessment & Plan Note (Signed)
Along with Dr. Tasia Catchings most recent bone marrow biopsy was normal she is planning to get one next year as well. ?

## 2021-11-05 NOTE — Assessment & Plan Note (Signed)
Rash under the breast initially treated as yeast infection now currently on mupirocin.  She notes it is improving she has a dermatologist advised scheduling follow-up next week if symptoms have not completely resolved at that point.  Does still appear to have some yeast looking but with improvement on antibiotic we will continue that treatment at this point. ?

## 2021-11-05 NOTE — Assessment & Plan Note (Signed)
Lab Results  ?Component Value Date  ? HGBA1C 6.0 (A) 11/05/2021  ?Stable test. Continue healthy diet and exercise. No need for treatment. ? ?

## 2021-11-05 NOTE — Progress Notes (Signed)
? ?Subjective:  ? ?  ?Roberta Curtis is a 81 y.o. female presenting for Follow-up (Yeast infection under breasts) ?  ? ? ?HPI ? ?#hypokalemia ?- taking OTC supplement ? ?#Yeast infection ?- under the breast ?- improving ?- both sides ?- using triamcinolone cream ?- went to UC on 10/24/2021 - diflucan x 3 and nystatin cream ?- went back on UC 10/31/2021 - diagnosed with impetigo - mupirocin cream ?- overall improving ? ?Did lose weight due to metallic taste - which has improved ?- appetite back to baseline ? ? ?Review of Systems ? ? ?Social History  ? ?Tobacco Use  ?Smoking Status Former  ? Packs/day: 1.00  ? Years: 6.00  ? Pack years: 6.00  ? Types: Cigarettes  ? Quit date: 02/20/1970  ? Years since quitting: 51.7  ?Smokeless Tobacco Never  ? ? ? ?   ?Objective:  ?  ?BP Readings from Last 3 Encounters:  ?11/05/21 130/80  ?10/31/21 130/78  ?10/24/21 120/80  ? ?Wt Readings from Last 3 Encounters:  ?11/05/21 130 lb 5 oz (59.1 kg)  ?10/08/21 130 lb 12.8 oz (59.3 kg)  ?09/29/21 127 lb (57.6 kg)  ? ? ?BP 130/80   Pulse 68   Temp 97.9 ?F (36.6 ?C) (Oral)   Ht 5' 1.25" (1.556 m)   Wt 130 lb 5 oz (59.1 kg)   SpO2 100%   BMI 24.42 kg/m?  ? ? ?Physical Exam ?Constitutional:   ?   General: She is not in acute distress. ?   Appearance: She is well-developed. She is not diaphoretic.  ?HENT:  ?   Right Ear: External ear normal.  ?   Left Ear: External ear normal.  ?Eyes:  ?   Conjunctiva/sclera: Conjunctivae normal.  ?Cardiovascular:  ?   Rate and Rhythm: Normal rate and regular rhythm.  ?   Heart sounds: No murmur heard. ?Pulmonary:  ?   Effort: Pulmonary effort is normal. No respiratory distress.  ?   Breath sounds: Normal breath sounds. No wheezing.  ?Musculoskeletal:  ?   Cervical back: Neck supple.  ?Skin: ?   General: Skin is warm and dry.  ?   Capillary Refill: Capillary refill takes less than 2 seconds.  ?   Comments: Erythematous macular rash below the breasts  ?Neurological:  ?   Mental Status: She is alert. Mental  status is at baseline.  ?Psychiatric:     ?   Mood and Affect: Mood normal.     ?   Behavior: Behavior normal.  ? ? ? ?Lab Results  ?Component Value Date  ? HGBA1C 6.0 (A) 11/05/2021  ? ? ? ?   ?Assessment & Plan:  ? ?Problem List Items Addressed This Visit   ? ?  ? Nervous and Auditory  ? Neuropathy  ?  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 Needs handicap sticker renewed ?  ?  ?  ? Other  ? 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  Along with Dr. Tasia Catchings most recent bone marrow biopsy was normal she is planning to get one next year as well. ?  ?  ? Relevant Medications  ? prochlorperazine (COMPAZINE) 10 MG tablet  ? S/P bone marrow transplant ([place])  ?  Following with Dr. Tasia Catchings, most recent biopsy was normal.  Appreciate Dr. Tasia Catchings support. ?  ?  ? Prediabetes - Primary  ?  Lab Results  ?Component Value Date  ? HGBA1C 6.0 (A) 11/05/2021  ?Stable test. Continue healthy diet and  exercise. No need for treatment. ? ?  ?  ? Relevant Orders  ? POCT glycosylated hemoglobin (Hb A1C) (Completed)  ? Chronic nausea  ?  Stable, refill compazine for prn use ?  ?  ? Relevant Medications  ? prochlorperazine (COMPAZINE) 10 MG tablet  ? Candidiasis  ?  Rash under the breast initially treated as yeast infection now currently on mupirocin.  She notes it is improving she has a dermatologist advised scheduling follow-up next week if symptoms have not completely resolved at that point.  Does still appear to have some yeast looking but with improvement on antibiotic we will continue that treatment at this point. ?  ?  ? ? ? ?No follow-ups on file. ? ?Lesleigh Noe, MD ? ?This visit occurred during the SARS-CoV-2 public health emergency.  Safety protocols were in place, including screening questions prior to the visit, additional usage of staff PPE, and extensive cleaning of exam room while observing appropriate contact time as indicated for disinfecting solutions.  ? ?

## 2021-11-06 ENCOUNTER — Telehealth: Payer: Self-pay

## 2021-11-06 NOTE — Telephone Encounter (Signed)
[facility] ?Nonclinical Telephone Record  ?[facility]? ?Client [facility] - Client ?Client Site [facility] ?Provider Waunita Schooner- MD ?Contact Type Call ?Who Is Calling Patient / Member / Family / Caregiver ?Caller Name [place] ?Caller Phone Number [phone] ?Patient Name Roberta Curtis ?Patient DOB [birth year] ?Call Type Message Only Information Provided ?Reason for Call Request for General Office Information ?Initial Comment Caller states she needs to find up postpone a dermatologist appt. ?Additional Comment Earliest appt she can get with the dermatologist is on the 15th. She wants to know if it is okay ?to wait until the 15th for the appt. ?Disp. Time Disposition Final User ?11/06/2021 11:12:07 AM General Information Provided Yes Maureen Ralphs ?Call Closed By: Maureen Ralphs ?Transaction Date/Time: 11/06/2021 11:05:19 AM (ET ?

## 2021-11-06 NOTE — Telephone Encounter (Signed)
[place] on 11/05/21 and discussed rash under breast; Please advise. Sending note to Dr Einar Pheasant and [facility] CMA. ? ?

## 2021-11-06 NOTE — Telephone Encounter (Signed)
Please let patient know that this is okay, and she can call our office if her rash is worsening and we can touch base about additional treatment. ?

## 2021-11-07 NOTE — Telephone Encounter (Signed)
Pt.notified

## 2021-11-12 ENCOUNTER — Telehealth: Payer: Self-pay

## 2021-11-12 NOTE — Telephone Encounter (Signed)
Left message for pt to call office to reschedule appt with Dr. Laurence Ferrari next week. ?Lurlean Horns., RMA ?

## 2021-11-14 ENCOUNTER — Other Ambulatory Visit: Payer: Self-pay | Admitting: Family Medicine

## 2021-11-14 DIAGNOSIS — M8580 Other specified disorders of bone density and structure, unspecified site: Secondary | ICD-10-CM

## 2021-11-19 ENCOUNTER — Ambulatory Visit: Payer: Medicare HMO | Admitting: Dermatology

## 2021-11-19 ENCOUNTER — Ambulatory Visit (INDEPENDENT_AMBULATORY_CARE_PROVIDER_SITE_OTHER): Payer: Medicare HMO | Admitting: Dermatology

## 2021-11-19 ENCOUNTER — Encounter: Payer: Self-pay | Admitting: Dermatology

## 2021-11-19 ENCOUNTER — Other Ambulatory Visit: Payer: Self-pay

## 2021-11-19 DIAGNOSIS — L304 Erythema intertrigo: Secondary | ICD-10-CM

## 2021-11-19 MED ORDER — HYDROCORTISONE 2.5 % EX CREA: TOPICAL_CREAM | CUTANEOUS | 2 refills | Status: DC

## 2021-11-19 MED ORDER — KETOCONAZOLE 2 % EX CREA: TOPICAL_CREAM | CUTANEOUS | 2 refills | Status: DC

## 2021-11-19 NOTE — Progress Notes (Signed)
? ?  Follow-Up Visit ?  ?Subjective  ?Roberta Curtis is a 81 y.o. female who presents for the following: Rash (Breast folds. Since end of January. Had follow-up with oncologist in February, was given Nystatin powder, worsened. Went to walk in clinic and was prescribed Nystatin cream. Most recent Rx is Mupirocin. PCP referred here. Red, inflamed, irritated). ? ? ? ?The following portions of the chart were reviewed this encounter and updated as appropriate:   ?  ? ?Review of Systems: No other skin or systemic complaints except as noted in HPI or Assessment and Plan. ? ? ?Objective  ?Well appearing patient in no apparent distress; mood and affect are within normal limits. ? ?A focused examination was performed including chest, breast folds. Relevant physical exam findings are noted in the Assessment and Plan. ? ?B/L mammary folds, L>R ?Well demarcated pink brown patches with mild maceration  ? ? 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 Condition is bothersome/symptomatic for patient. Currently flared.  ? ?Mix hydrocortisone with ketaconazole 2% twice a day. If improved, decrease to hydrocortisone and ketaconazole mixed once a day. If still clear, decrease to ketaconazole only. ? ?Intertrigo is a chronic recurrent rash that occurs in skin fold areas that may be associated with friction; heat; moisture; yeast; fungus; and bacteria.  It is exacerbated by increased movement / activity; sweating; and higher atmospheric temperature. ? ?Keep area dry as much as possible.  ? ?Can use Zeasorb AF powder daily after shower, once clear, as maintenance.  ? ? ? ?ketoconazole (NIZORAL) 2 % cream - B/L mammary folds, L>R ?Apply to breast folds twice daily, mixed with hydrocortisone cream,  for 1-2 weeks, decrease to once daily as directed ? ?hydrocortisone 2.5 % cream - B/L mammary folds, L>R ?Apply twice daily, mixed with  ketoconazole for 1-2 weeks ? ? ?Return in about 1 month (around 12/20/2021) for Rash Follow Up. ? ?I, Emelia Salisbury, CMA, am acting as scribe for Brendolyn Patty, MD. ? ?Documentation: I have reviewed the above documentation for accuracy and completeness, and I agree with the above. ? ?Brendolyn Patty MD  ? ?

## 2021-11-19 NOTE — Patient Instructions (Addendum)
Intertrigo ? ?Mix hydrocortisone with ketaconazole 2% twice a day for 1-2 weeks. If improved, decrease to hydrocortisone and ketaconazole mixed once a day for 1-2 weeks. If still clear, decrease to ketaconazole only. ? ?Intertrigo is a chronic recurrent rash that occurs in skin fold areas that may be associated with friction; heat; moisture; yeast; fungus; and bacteria.  It is exacerbated by increased movement / activity; sweating; and higher atmospheric temperature. ? ?Once clear can use Zeasorb AF powder once daily as maintenance.  ? ? ? ? ?If You Need Anything After Your Visit ? ?If you have any questions or concerns for your doctor, please call our main line at [phone] and press option 4 to reach your doctor's medical assistant. If no one answers, please leave a voicemail as directed and we will return your call as soon as possible. Messages left after 4 pm will be answered the following business day.  ? ?You may also send [place] a message via MyChart. We typically respond to MyChart messages within 1-2 business days. ? ?For prescription refills, please ask your pharmacy to contact our office. Our fax number is [phone]. ? ?If you have an urgent issue when the clinic is closed that cannot wait until the next business day, you can page your doctor at the number below.   ? ?Please note that while we do our best to be available for urgent issues outside of office hours, we are not available 24/7.  ? ?If you have an urgent issue and are unable to reach [place], you may choose to seek medical care at your doctor's office, retail clinic, urgent care center, or emergency room. ? ?If you have a medical emergency, please immediately call 911 or go to the emergency department. ? ?Pager Numbers ? ?- Dr. Nehemiah Massed: [phone] ? ?- Dr. Laurence Ferrari: [phone] ? ?- Dr. Nicole Kindred: [phone] ? ?In the event of inclement weather, please call our main line at [phone] for an update on the status of any delays or  closures. ? ?Dermatology Medication Tips: ?Please keep the boxes that topical medications come in in order to help keep track of the instructions about where and how to use these. Pharmacies typically print the medication instructions only on the boxes and not directly on the medication tubes.  ? ?If your medication is too expensive, please contact our office at [phone] option 4 or send [place] a message through [place].  ? ?We are unable to tell what your co-pay for medications will be in advance as this is different depending on your insurance coverage. However, we may be able to find a substitute medication at lower cost or fill out paperwork to get insurance to cover a needed medication.  ? ?If a prior authorization is required to get your medication covered by your insurance company, please allow [place] 1-2 business days to complete this process. ? ?Drug prices often vary depending on where the prescription is filled and some pharmacies may offer cheaper prices. ? ?The website www.goodrx.com contains coupons for medications through different pharmacies. The prices here do not account for what the cost may be with help from insurance (it may be cheaper with your insurance), but the website can give you the price if you did not use any insurance.  ?- You can print the associated coupon and take it with your prescription to the pharmacy.  ?- You may also stop by our office during regular business hours and pick up a GoodRx coupon card.  ?- If you need  your prescription sent electronically to a different pharmacy, notify our office through [facility] or by phone at [phone] option 4. ? ? ? ? ?Si Usted Necesita Algo Despu?s de Su Visita ? ?Tambi?n puede enviarnos un mensaje a trav?s de MyChart. Por lo general respondemos a los mensajes de MyChart en el transcurso de 1 a 2 d?as h?biles. ? ?Para renovar recetas, por favor pida a su farmacia que se ponga en contacto con nuestra oficina. Nuestro n?mero de fax  es el [phone]. ? ?Si tiene un asunto urgente cuando la cl?nica est? cerrada y que no puede esperar hasta el siguiente d?a h?bil, puede llamar/localizar a su doctor(a) al n?mero que aparece a continuaci?n.  ? ?Por favor, tenga en cuenta que aunque hacemos todo lo posible para estar disponibles para asuntos urgentes fuera del horario de oficina, no estamos disponibles las 24 horas del d?a, los 7 d?as de la semana.  ? ?Si tiene un problema urgente y no puede comunicarse con nosotros, puede optar por buscar atenci?n m?dica  en el consultorio de su doctor(a), en una cl?nica privada, en un centro de atenci?n urgente o en una sala de emergencias. ? ?Si tiene Engineer, maintenance (IT) m?dica, por favor llame inmediatamente al 911 o vaya a la sala de emergencias. ? ?N?meros de b?per ? ?- Dr. Nehemiah Massed: [phone] ? ?- Dra. Moye: [phone] ? ?- Dra. Nicole Kindred: [phone] ? ?En caso de inclemencias del tiempo, por favor llame a nuestra l?nea principal al [phone] para una actualizaci?n sobre el estado de cualquier retraso o cierre. ? ?Consejos para la medicaci?n en dermatolog?a: ?Por favor, guarde las cajas en las que vienen los medicamentos de uso t?pico para ayudarle a seguir las instrucciones sobre d?nde y c?mo usarlos. Las farmacias generalmente imprimen las instrucciones del medicamento s?lo en las cajas y no directamente en los tubos del [place].  ? ?Si su medicamento es muy caro, por favor, p?ngase en contacto con Zigmund Daniel llamando al [phone] y presione la opci?n 4 o env?enos un mensaje a trav?s de MyChart.  ? ?No podemos decirle cu?l ser? su copago por los medicamentos por adelantado ya que esto es diferente dependiendo de la cobertura de su seguro. Sin embargo, es posible que podamos encontrar un medicamento sustituto a Electrical engineer un formulario para que el seguro cubra el medicamento que se considera necesario.  ? ?Si se requiere Ardelia Mems autorizaci?n previa para que su compa??a de seguros [place]  su medicamento, por favor perm?tanos de 1 a 2 d?as h?biles para completar este proceso. ? ?Los precios de los medicamentos var?an con frecuencia dependiendo del Environmental consultant de d?nde se surte la receta y alguna farmacias pueden ofrecer precios m?s baratos. ? ?El sitio web www.goodrx.com tiene cupones para medicamentos de Airline pilot. Los precios aqu? no tienen en cuenta lo que podr?a costar con la ayuda del seguro (puede ser m?s barato con su seguro), pero el sitio web puede darle el precio si no utiliz? ning?n seguro.  ?- Puede imprimir el cup?n correspondiente y llevarlo con su receta a la farmacia.  ?- Tambi?n puede pasar por nuestra oficina durante el horario de atenci?n regular y recoger una tarjeta de cupones de GoodRx.  ?- Si necesita que su receta se env?e electr?nicamente a Chiropodist, informe a nuestra oficina a trav?s de MyChart de [place] o por tel?fono llamando al [phone] y presione la opci?n 4.  ?

## 2021-11-24 ENCOUNTER — Ambulatory Visit (INDEPENDENT_AMBULATORY_CARE_PROVIDER_SITE_OTHER): Payer: Medicare HMO | Admitting: Urology

## 2021-11-24 ENCOUNTER — Other Ambulatory Visit: Payer: Self-pay

## 2021-11-24 VITALS — BP 119/75 | HR 80 | Ht 61.5 in | Wt 129.0 lb

## 2021-11-24 DIAGNOSIS — N3946 Mixed incontinence: Secondary | ICD-10-CM

## 2021-11-24 DIAGNOSIS — R350 Frequency of micturition: Secondary | ICD-10-CM

## 2021-11-24 MED ORDER — MIRABEGRON ER 50 MG PO TB24: 50.0000 mg | ORAL_TABLET | Freq: Every day | ORAL | 11 refills | Status: DC

## 2021-11-24 NOTE — Progress Notes (Signed)
? ?11/24/2021 ?9:00 AM  ? ?Roberta Curtis ?1941-07-27 ?[id number] ? ?Referring provider: Lesleigh Noe, MD ?[place],  [place] [address] ? ?Chief Complaint  ?Patient presents with  ? Urinary Incontinence  ? ? ?HPI: ?I was consulted to assess the patient's urinary incontinence worsening over the last year or longer.  She denies stress incontinence.  She has urge incontinence.  She wears 1 pad a day that can be damp or moderately wet.  Sometimes it is higher volume.  She rarely may have a little bit of dampness at night while she sleeping ?  ?She voids every 3-4 hours gets up once at night. ?  ?Patient primarily has urge incontinence.  I will perform a cystoscopy in approximately 5 or 6 weeks on Myrbetriq 50 mg samples and prescription.  Call if urine culture positive ?  ?Today ?Frequency stable.  Myrbetriq worked well but she switched to oxybutynin due to $100 co-pay.  Last urine culture positive and treated ?  ?Dramatic improvement on oxybutynin.  Minimal leakage.  No nocturia.  Frequency much improved.  Clinically not infected ?  ?Cystoscopy: Normal ? ?Today ?Frequency stable. ?Patient is currently having 1 pad a day urge incontinence.  She is on tolterodine.  Clinically not infected ?By mid afternoon pill is no longer working well ? ? ?PMH: ?Past Medical History:  ?Diagnosis Date  ? Acute myeloblastic leukemia ([place])   ? GERD (gastroesophageal reflux disease)   ? Heart murmur   ? Osteopenia   ? Thrush   ? ? ?Surgical History: ?Past Surgical History:  ?Procedure Laterality Date  ? BONE MARROW TRANSPLANT    ? BREAST LUMPECTOMY Left   ? fatty tumor,1970's  ? CATARACT EXTRACTION    ? CESAREAN SECTION    ? HYSTEROTOMY    ? LYMPH GLAND EXCISION    ? MENISCUS REPAIR Right   ? NECK SURGERY    ? SALPINGECTOMY    ? WISDOM TOOTH EXTRACTION    ? ? ?Home Medications:  ?Allergies as of 11/24/2021   ? ?   Reactions  ? Jadenu [deferasirox] Other (See Comments)  ? Elevated Iron, headache and rash  ? Levofloxacin Other  (See Comments)  ? Ondansetron Hcl Other (See Comments)  ? Codeine Palpitations  ? Oxycodone-acetaminophen Palpitations  ? Penicillins Rash  ? ?  ? ?  ?Medication List  ?  ? ?  ? Accurate as of November 24, 2021  9:00 AM. If you have any questions, ask your nurse or doctor.  ?  ?  ? ?  ? ?STOP taking these medications   ? ?nystatin cream ?Commonly known as: MYCOSTATIN ?Stopped by: Reece Packer, MD ?  ?nystatin powder ?Commonly known as: MYCOSTATIN/NYSTOP ?Stopped by: Reece Packer, MD ?  ? ?  ? ?TAKE these medications   ? ?albuterol 108 (90 Base) MCG/ACT inhaler ?Commonly known as: Ventolin HFA ?Inhale 1-2 puffs into the lungs as needed for wheezing or shortness of breath. ?  ?BIOTIN PO ?Take 1,000 mcg by mouth daily. ?  ?calcium carbonate 1250 (500 Ca) MG tablet ?Commonly known as: OS-CAL - dosed in mg of elemental calcium ?Take 1 tablet by mouth. ?  ?cyclobenzaprine 5 MG tablet ?Commonly known as: FLEXERIL ?  ?esomeprazole 20 MG capsule ?Commonly known as: [place] ?TAKE 1 CAPSULE (20 MG TOTAL) BY MOUTH DAILY AT 12 NOON. ?  ?hydrocortisone 2.5 % cream ?Apply twice daily, mixed with ketoconazole for 1-2 weeks ?  ?ibandronate 150 MG tablet ?  Commonly known as: BONIVA ?TAKE 1 TABLET (150 MG TOTAL) BY MOUTH EVERY 30 (THIRTY) DAYS. ?  ?ketoconazole 2 % cream ?Commonly known as: NIZORAL ?Apply to breast folds twice daily, mixed with hydrocortisone cream,  for 1-2 weeks, decrease to once daily as directed ?  ?POTASSIUM PO ?Take 99 mg by mouth daily. ?  ?prochlorperazine 10 MG tablet ?Commonly known as: COMPAZINE ?Take 1 tablet (10 mg total) by mouth daily. ?  ?tolterodine 4 MG 24 hr capsule ?Commonly known as: Detrol LA ?Take 1 capsule (4 mg total) by mouth daily. ?  ?triamcinolone cream 0.1 % ?Commonly known as: KENALOG ?Apply 1 application topically 2 (two) times daily. ?  ?VITAMIN D PO ?Take 50,000 mcg by mouth daily. ?  ? ?  ? ? ?Allergies:  ?Allergies  ?Allergen Reactions  ? Jadenu [Deferasirox] Other (See  Comments)  ?  Elevated Iron, headache and rash  ? Levofloxacin Other (See Comments)  ? Ondansetron Hcl Other (See Comments)  ? Codeine Palpitations  ? Oxycodone-Acetaminophen Palpitations  ? Penicillins Rash  ? ? ?Family History: ?Family History  ?Problem Relation Age of Onset  ? Retinitis pigmentosa Mother   ? Stroke Mother   ? Liver cancer Father   ? Heart disease Father   ? Heart disease Brother   ? Blindness Brother   ? ? ?Social History:  reports that she quit smoking about 51 years ago. Her smoking use included cigarettes. She has a 6.00 pack-year smoking history. She has never used smokeless tobacco. She reports that she does not currently use alcohol. She reports that she does not use drugs. ? ?ROS: ?  ? ?  ? ?  ? ?  ? ?  ? ?  ? ?  ? ?  ? ?  ? ?  ? ?  ? ?  ? ?  ? ?Physical Exam: ?BP 119/75   Pulse 80   Ht 5' 1.5" (1.562 m)   Wt 58.5 kg   BMI 23.98 kg/m?   ?Constitutional:  Alert and oriented, No acute distress. ?HEENT: [place] AT, moist mucus membranes.  Trachea midline, no masses. ? ?Laboratory Data: ?Lab Results  ?Component Value Date  ? WBC 8.2 10/06/2021  ? HGB 12.0 10/06/2021  ? HCT 36.9 10/06/2021  ? MCV 73.8 (L) 10/06/2021  ? PLT 228 10/06/2021  ? ? ?Lab Results  ?Component Value Date  ? CREATININE 0.83 10/23/2021  ? ? ?No results found for: PSA ? ?No results found for: TESTOSTERONE ? ?Lab Results  ?Component Value Date  ? HGBA1C 6.0 (A) 11/05/2021  ? ? ?Urinalysis ?   ?Component Value Date/Time  ? APPEARANCEUR Clear 11/18/2020 0943  ? GLUCOSEU Negative 11/18/2020 0943  ? [place] Negative 11/18/2020 0943  ? PROTEINUR Negative 11/18/2020 0943  ? NITRITE Negative 11/18/2020 0943  ? LEUKOCYTESUR Negative 11/18/2020 0943  ? ? ?Pertinent Imaging: ? ? ?Assessment & Plan: Patient is wearing 1 pad a day that is larger.  She was go back on Myrbetriq since the co-pay may have changed.  I gave samples and prescription.  All discussed 3 refractory treatments next time pending result ? ?There are no diagnoses  linked to this encounter. ? ?No follow-ups on file. ? ?Reece Packer, MD ? ?[facility] ?[address] ?[place], [place] [address] ?[phone] ?  ?

## 2021-11-24 NOTE — Addendum Note (Signed)
Addended by: Brendolyn Patty on: 11/24/2021 08:43 AM ? ? Modules accepted: Level of Service ? ?

## 2021-11-25 LAB — URINALYSIS, COMPLETE
Bilirubin, UA: NEGATIVE
Glucose, UA: NEGATIVE
Ketones, UA: NEGATIVE
Nitrite, UA: NEGATIVE
Protein,UA: NEGATIVE
Specific Gravity, UA: 1.025 (ref 1.005–1.030)
Urobilinogen, Ur: 0.2 mg/dL (ref 0.2–1.0)
pH, UA: 5.5 (ref 5.0–7.5)

## 2021-11-25 LAB — MICROSCOPIC EXAMINATION: WBC, UA: >30 /hpf — AB (ref 0–5)

## 2021-11-27 LAB — CULTURE, URINE COMPREHENSIVE

## 2021-12-01 ENCOUNTER — Telehealth: Payer: Self-pay

## 2021-12-01 MED ORDER — NITROFURANTOIN MONOHYD MACRO 100 MG PO CAPS: 100.0000 mg | ORAL_CAPSULE | Freq: Two times a day (BID) | ORAL | 0 refills | Status: DC

## 2021-12-01 NOTE — Telephone Encounter (Signed)
-----   Message from Bjorn Loser, MD sent at 12/01/2021  8:11 AM EDT ----- ?Macrodantin 100 mg twice a day for 7 days ?----- Message ----- ?From: Alvera Novel, CMA ?Sent: 11/27/2021   8:14 AM EDT ?To: Bjorn Loser, MD ? ? ?----- Message ----- ?From: Interface, [facility] Lab Results In ?Sent: 11/25/2021   9:36 AM EDT ?To: [facility] Clinical ? ? ? ?

## 2021-12-01 NOTE — Telephone Encounter (Signed)
Pt aware . Sent to pharmacy. ?

## 2021-12-20 ENCOUNTER — Emergency Department: Payer: Medicare HMO

## 2021-12-20 ENCOUNTER — Other Ambulatory Visit: Payer: Self-pay

## 2021-12-20 ENCOUNTER — Encounter: Payer: Self-pay | Admitting: Emergency Medicine

## 2021-12-20 ENCOUNTER — Emergency Department
Admission: EM | Admit: 2021-12-20 | Discharge: 2021-12-20 | Disposition: A | Discharge status: Home / Self Care | Payer: Medicare HMO | Attending: Emergency Medicine | Admitting: Emergency Medicine

## 2021-12-20 DIAGNOSIS — R0602 Shortness of breath: Secondary | ICD-10-CM | POA: Insufficient documentation

## 2021-12-20 DIAGNOSIS — Z20822 Contact with and (suspected) exposure to covid-19: Secondary | ICD-10-CM | POA: Diagnosis not present

## 2021-12-20 DIAGNOSIS — R079 Chest pain, unspecified: Secondary | ICD-10-CM | POA: Diagnosis not present

## 2021-12-20 DIAGNOSIS — J90 Pleural effusion, not elsewhere classified: Secondary | ICD-10-CM

## 2021-12-20 DIAGNOSIS — R1012 Left upper quadrant pain: Secondary | ICD-10-CM

## 2021-12-20 DIAGNOSIS — I700 Atherosclerosis of aorta: Secondary | ICD-10-CM

## 2021-12-20 DIAGNOSIS — R091 Pleurisy: Secondary | ICD-10-CM

## 2021-12-20 LAB — COMPREHENSIVE METABOLIC PANEL
ALT: 16 U/L (ref 0–44)
AST: 27 U/L (ref 15–41)
Albumin: 3.7 g/dL (ref 3.5–5.0)
Alkaline Phosphatase: 56 U/L (ref 38–126)
Anion gap: 8 (ref 5–15)
BUN: 15 mg/dL (ref 8–23)
CO2: 22 mmol/L (ref 22–32)
Calcium: 9 mg/dL (ref 8.9–10.3)
Chloride: 109 mmol/L (ref 98–111)
Creatinine, Ser: 0.79 mg/dL (ref 0.44–1.00)
GFR, Estimated: >60 mL/min (ref >60)
Glucose, Bld: 87 mg/dL (ref 70–99)
Potassium: 3.7 mmol/L (ref 3.5–5.1)
Sodium: 139 mmol/L (ref 135–145)
Total Bilirubin: 0.9 mg/dL (ref 0.3–1.2)
Total Protein: 6.9 g/dL (ref 6.5–8.1)

## 2021-12-20 LAB — URINALYSIS, ROUTINE W REFLEX MICROSCOPIC
Bilirubin Urine: NEGATIVE
Glucose, UA: NEGATIVE mg/dL
Hgb urine dipstick: NEGATIVE
Ketones, ur: NEGATIVE mg/dL
Leukocytes,Ua: NEGATIVE
Nitrite: NEGATIVE
Protein, ur: NEGATIVE mg/dL
Specific Gravity, Urine: 1.041 — ABNORMAL HIGH (ref 1.005–1.030)
pH: 6 (ref 5.0–8.0)

## 2021-12-20 LAB — CBC WITH DIFFERENTIAL/PLATELET
Abs Immature Granulocytes: 0.02 10*3/uL (ref 0.00–0.07)
Basophils Absolute: 0 10*3/uL (ref 0.0–0.1)
Basophils Relative: 1 %
Eosinophils Absolute: 0.1 10*3/uL (ref 0.0–0.5)
Eosinophils Relative: 2 %
HCT: 36.4 % (ref 36.0–46.0)
Hemoglobin: 11.4 g/dL — ABNORMAL LOW (ref 12.0–15.0)
Immature Granulocytes: 0 %
Lymphocytes Relative: 35 %
Lymphs Abs: 2.9 10*3/uL (ref 0.7–4.0)
MCH: 23.1 pg — ABNORMAL LOW (ref 26.0–34.0)
MCHC: 31.3 g/dL (ref 30.0–36.0)
MCV: 73.8 fL — ABNORMAL LOW (ref 80.0–100.0)
Monocytes Absolute: 0.8 10*3/uL (ref 0.1–1.0)
Monocytes Relative: 9 %
Neutro Abs: 4.3 10*3/uL (ref 1.7–7.7)
Neutrophils Relative %: 53 %
Platelets: 214 10*3/uL (ref 150–400)
RBC: 4.93 MIL/uL (ref 3.87–5.11)
RDW: 16.1 % — ABNORMAL HIGH (ref 11.5–15.5)
WBC: 8.1 10*3/uL (ref 4.0–10.5)
nRBC: 0 % (ref 0.0–0.2)

## 2021-12-20 LAB — RESP PANEL BY RT-PCR (FLU A&B, COVID) ARPGX2
Influenza A by PCR: NEGATIVE
Influenza B by PCR: NEGATIVE
SARS Coronavirus 2 by RT PCR: NEGATIVE

## 2021-12-20 LAB — LIPASE, BLOOD: Lipase: 24 U/L (ref 11–51)

## 2021-12-20 LAB — TROPONIN I (HIGH SENSITIVITY): Troponin I (High Sensitivity): 8 ng/L (ref <18)

## 2021-12-20 IMAGING — CT CT ABD-PELV W/ CM
2 of 5 series · 15 of 46 positions shown, 17 images · IV contrast (APPLIED)
Comparison: None.

CLINICAL DATA: Difficulty breathing, left upper abdomen pain.



[Series 2: abdomen 5.0 · axial · 0.67mm/px · z∈[-894,-504]mm · 12 of 91 slices shown, 14 images]
[im 7/91  soft-tissue]
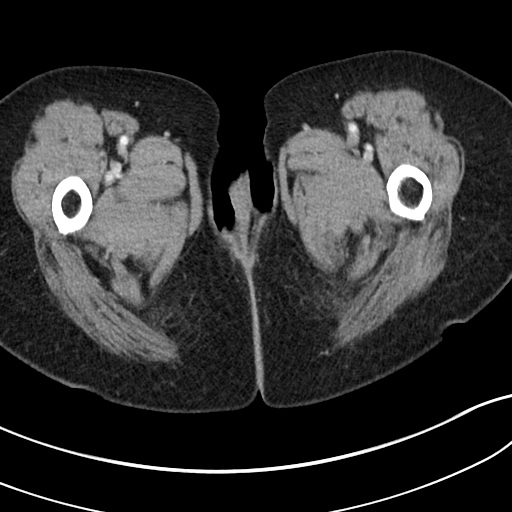
[im 7/91  bone]
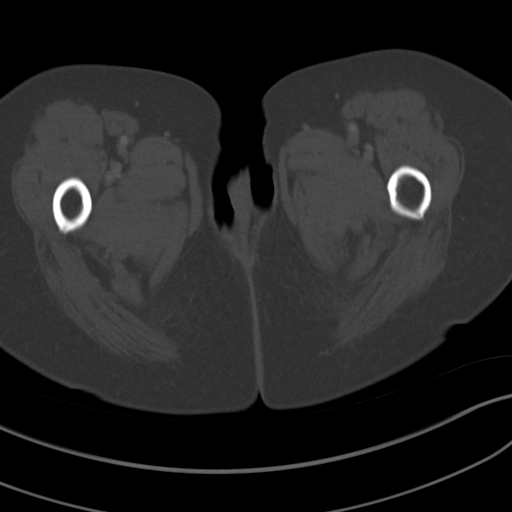
[im 13/91  soft-tissue]
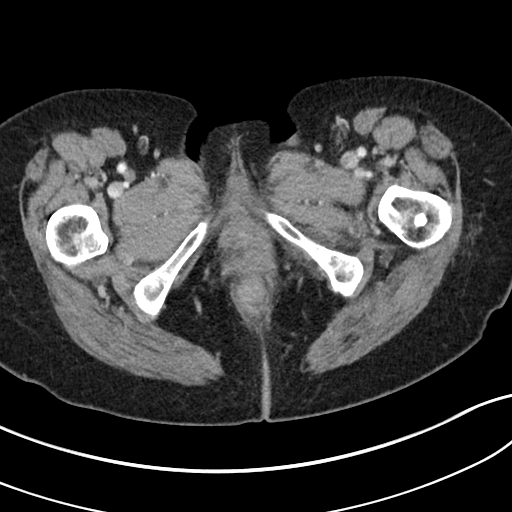
[im 19/91  soft-tissue]
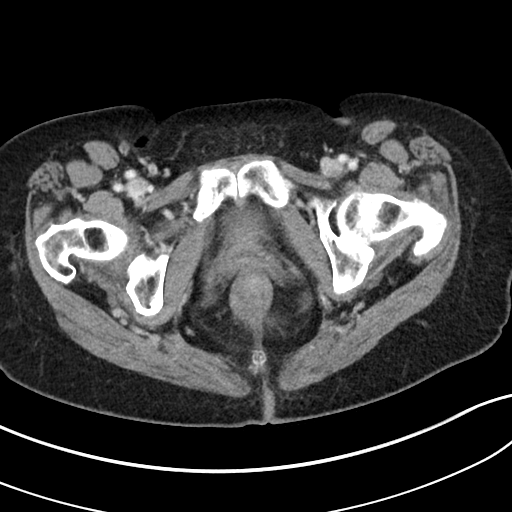
[im 31/91  soft-tissue]
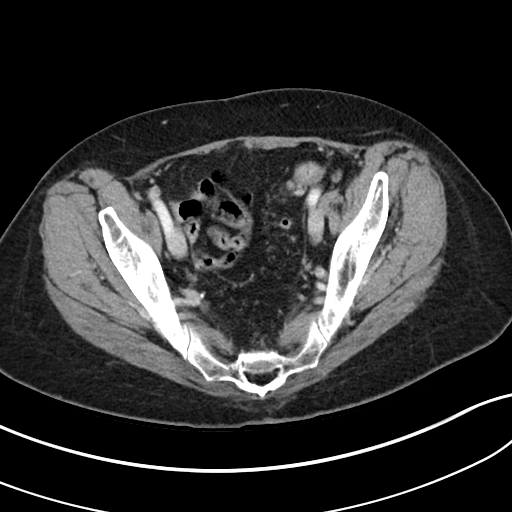
[im 37/91  soft-tissue]
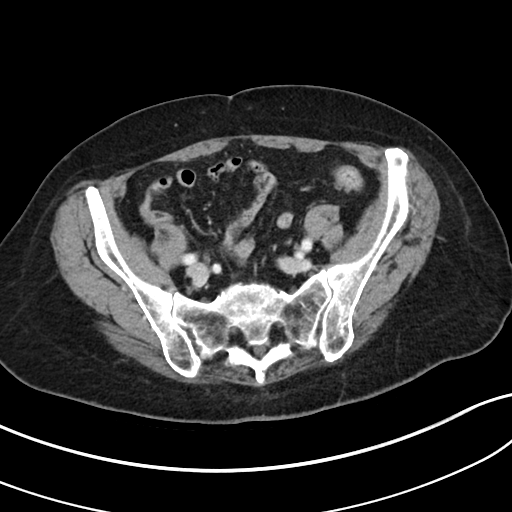
[im 43/91  soft-tissue]
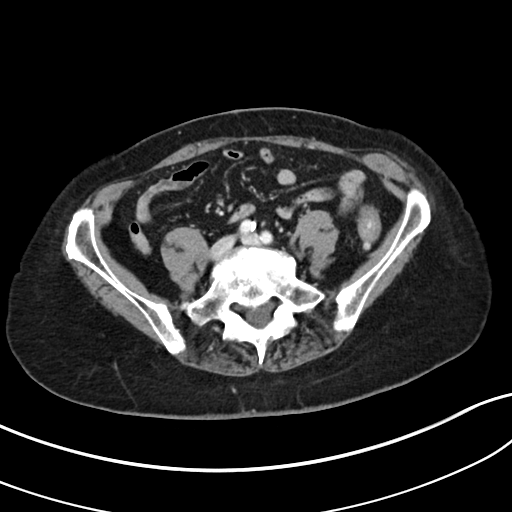
[im 49/91  soft-tissue]
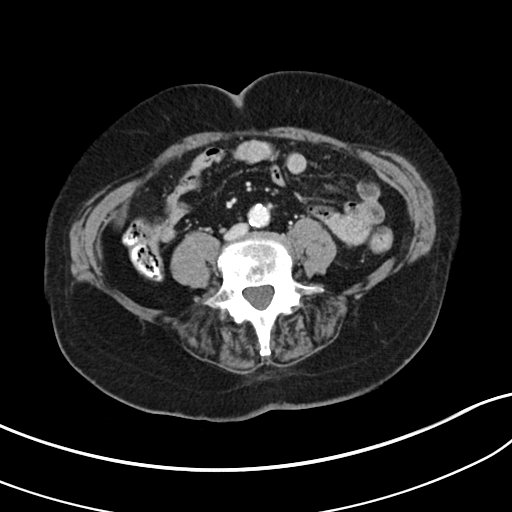
[im 55/91  soft-tissue]
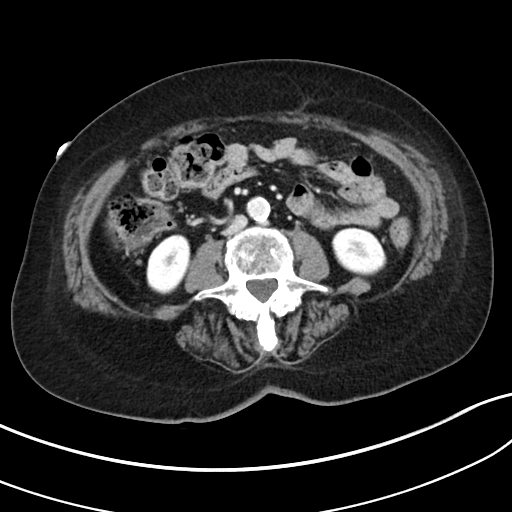
[im 61/91  soft-tissue]
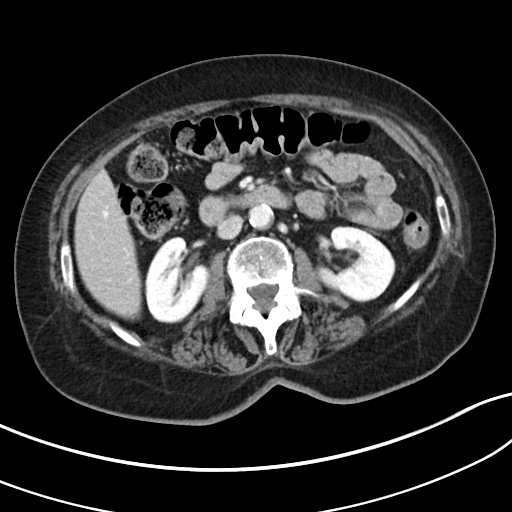
[im 61/91  bone]
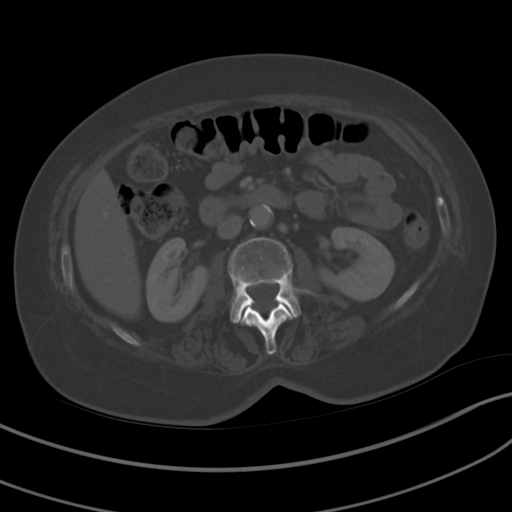
[im 73/91  soft-tissue]
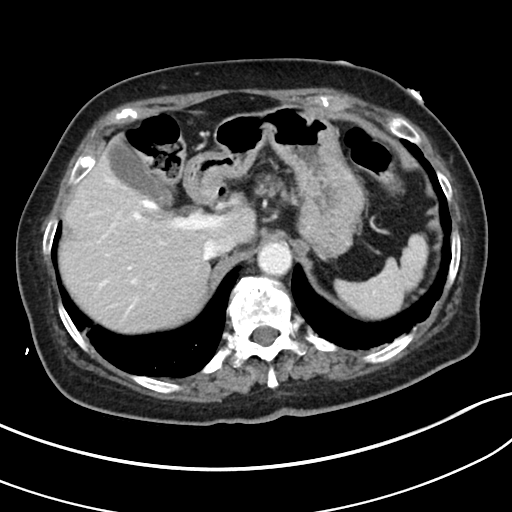
[im 79/91  soft-tissue]
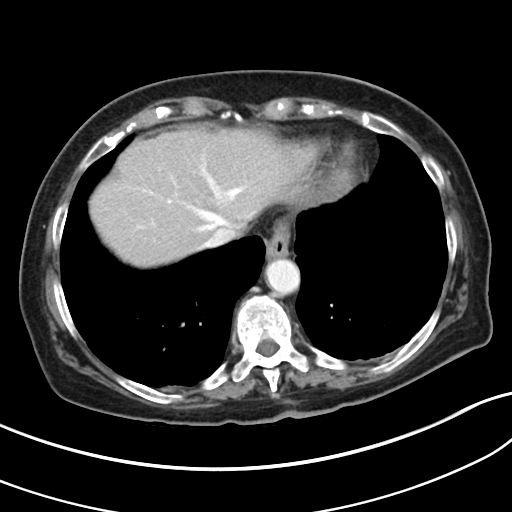
[im 85/91  soft-tissue]
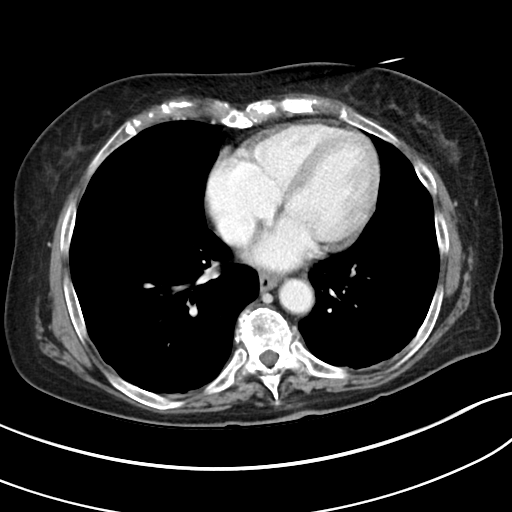

[Series 5: abdomen 3.0 mpr cor · coronal · 0.78mm/px · 3 of 89 slices shown]
[im 30/89  soft-tissue]
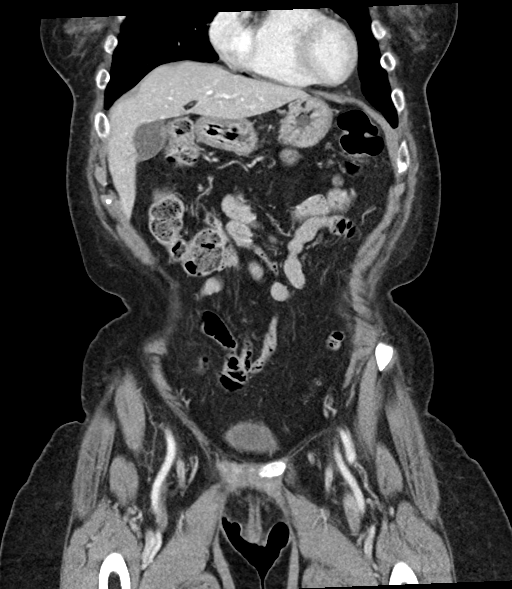
[im 40/89  soft-tissue]
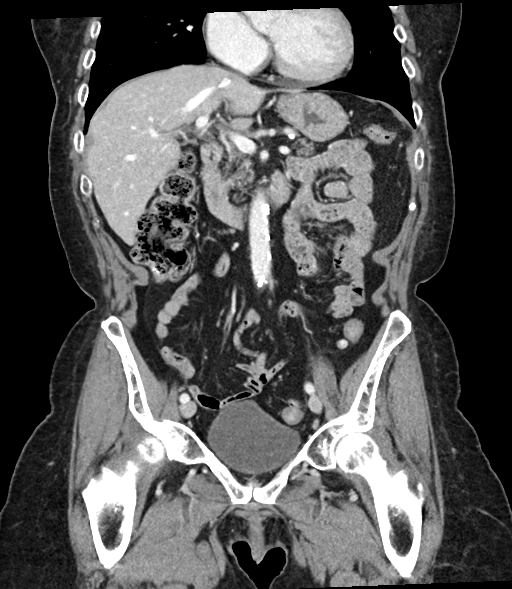
[im 49/89  soft-tissue]
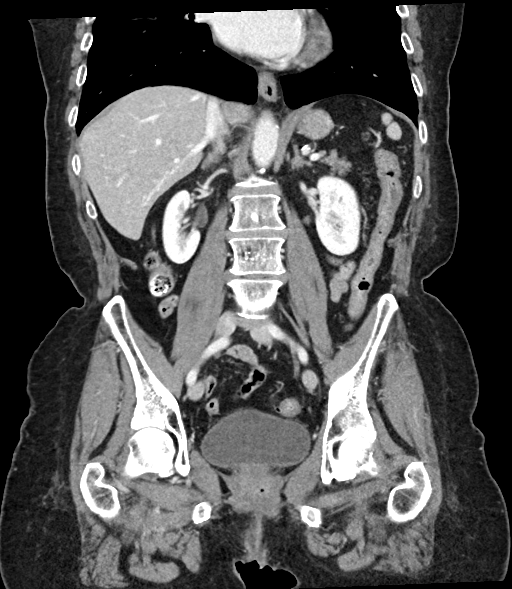

[15 of 46 positions shown; findings below may reference images not displayed]

RADIATION DOSE REDUCTION: This exam was performed according to the
departmental dose-optimization program which includes automated
exposure control, adjustment of the mA and/or kV according to
patient size and/or use of iterative reconstruction technique.

CONTRAST:  75mL OMNIPAQUE IOHEXOL 350 MG/ML SOLN
FINDINGS: CTA CHEST FINDINGS

Cardiovascular: Satisfactory opacification of the pulmonary arteries
to the segmental level. No evidence of pulmonary embolism. Normal
heart size. No pericardial effusion.

Mediastinum/Nodes: No enlarged mediastinal, hilar, or axillary lymph
nodes. Thyroid gland, trachea, and esophagus demonstrate no
significant findings.

Lungs/Pleura: Mild patchy ground-glass opacity is identified
throughout bilateral lungs. Minimal bilateral pleural effusion are
noted.

Musculoskeletal: Degenerative joint changes of the spine identified.

Review of the MIP images confirms the above findings.

CT ABDOMEN and PELVIS FINDINGS

Hepatobiliary: No focal liver abnormality is seen. No gallstones,
gallbladder wall thickening, or biliary dilatation.

Pancreas: Unremarkable. No pancreatic ductal dilatation or
surrounding inflammatory changes.

Spleen: Normal in size without focal abnormality.

Adrenals/Urinary Tract: The bilateral adrenal glands are normal.
There is no hydronephrosis bilaterally. A 2 mm cyst is identified in
the lower pole left kidney. The right kidney is normal. No
hydronephrosis is identified bilaterally. The bladder is normal.

Stomach/Bowel: Stomach is within normal limits. The appendix is not
seen but no inflammation is noted around cecum. No evidence of bowel
wall thickening, distention, or inflammatory changes. There is
diverticulosis of colon.

Vascular/Lymphatic: Aortic atherosclerosis. No enlarged abdominal or
pelvic lymph nodes.

Reproductive: Status post hysterectomy. No adnexal masses.

Other: None.

Musculoskeletal: Degenerative joint changes of spine are noted.

Review of the MIP images confirms the above findings.
IMPRESSION: 1. No evidence of pulmonary embolism.
2. Mild patchy ground-glass opacity throughout bilateral lungs,
which is nonspecific but can be seen with viral pneumonia.
3. Minimal bilateral pleural effusions.
4. No acute abnormality in the abdomen or pelvis.
5. Aortic atherosclerosis.

Aortic Atherosclerosis ([IB]-[IB]).

## 2021-12-20 IMAGING — CT CT ANGIO CHEST
2 of 6 series · 17 of 46 positions shown · IV contrast (APPLIED)
Comparison: None.

CLINICAL DATA: Difficulty breathing, left upper abdomen pain.



[Series 8: thins · axial · 0.69mm/px · z∈[-558,-336]mm · 14 of 349 slices shown]
[im 16/349  lung]
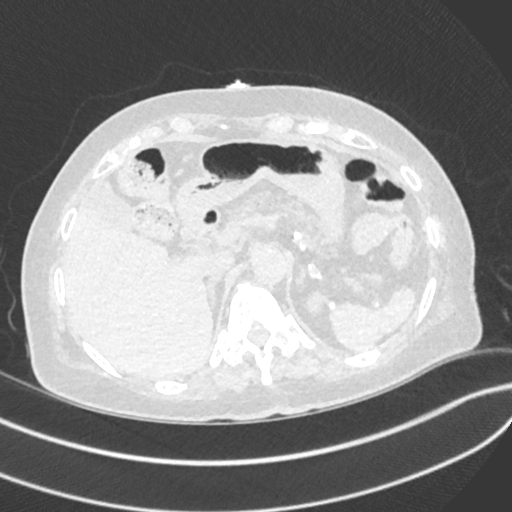
[im 46/349  soft-tissue]
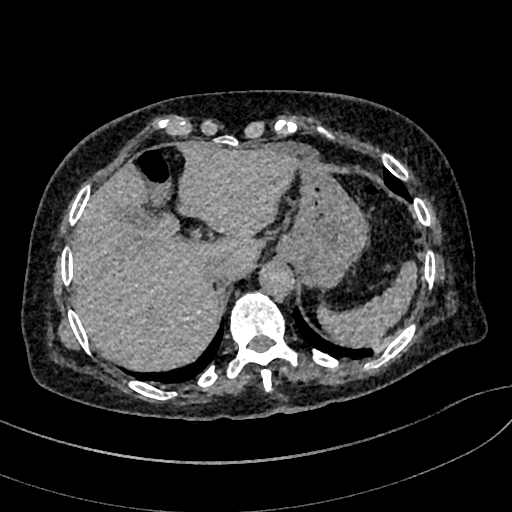
[im 61/349  lung]
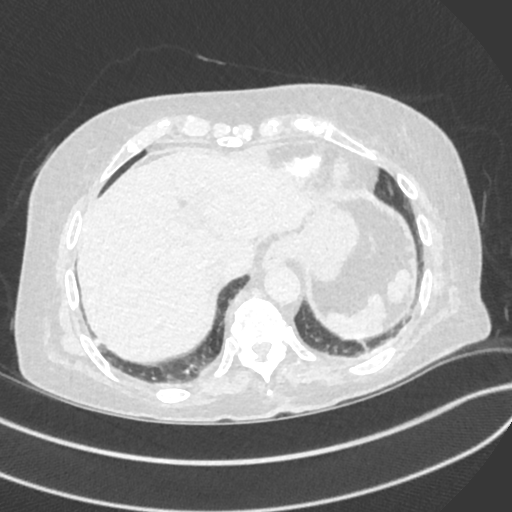
[im 91/349  soft-tissue]
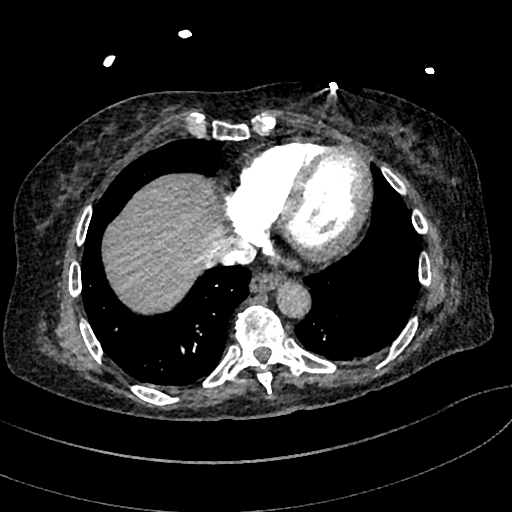
[im 122/349  lung]
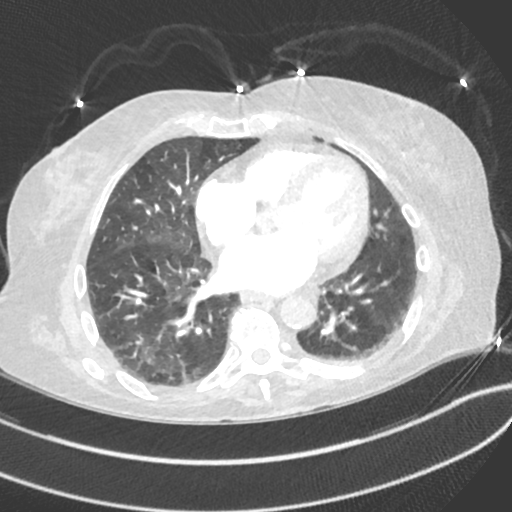
[im 137/349  soft-tissue]
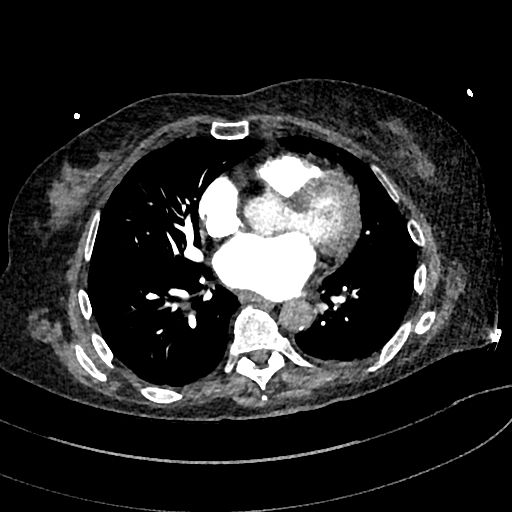
[im 167/349  lung]
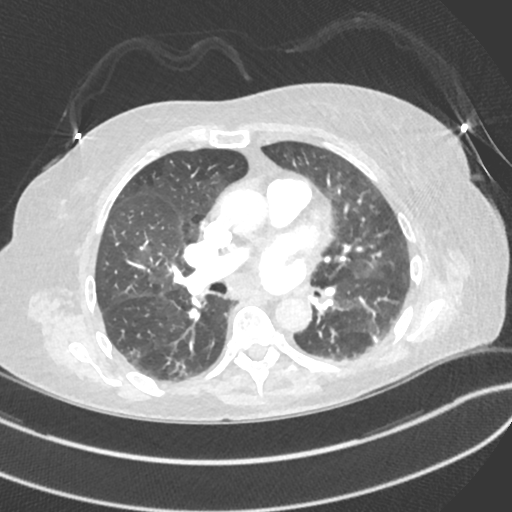
[im 182/349  soft-tissue]
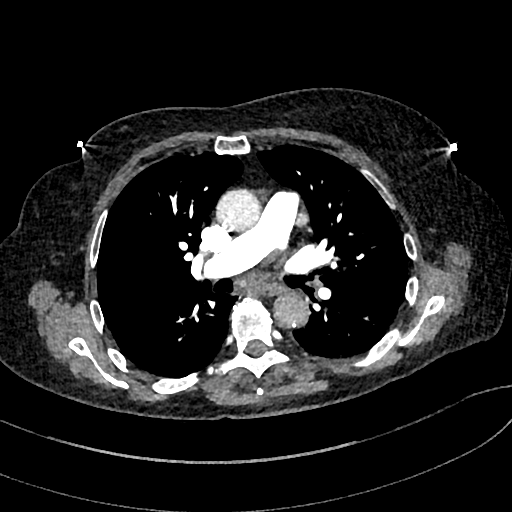
[im 212/349  lung]
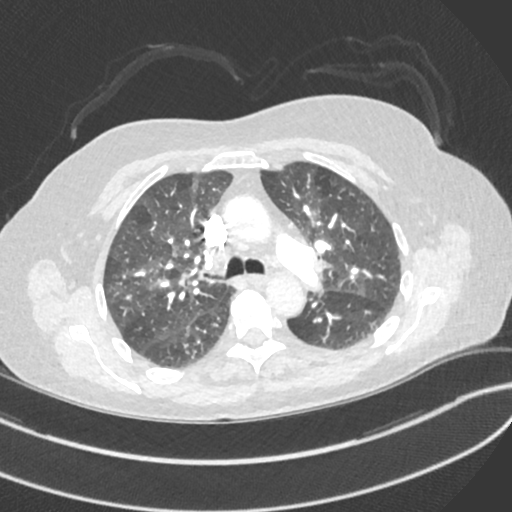
[im 227/349  soft-tissue]
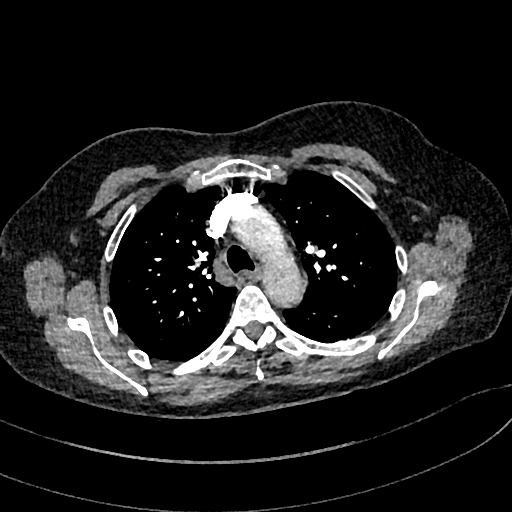
[im 258/349  lung]
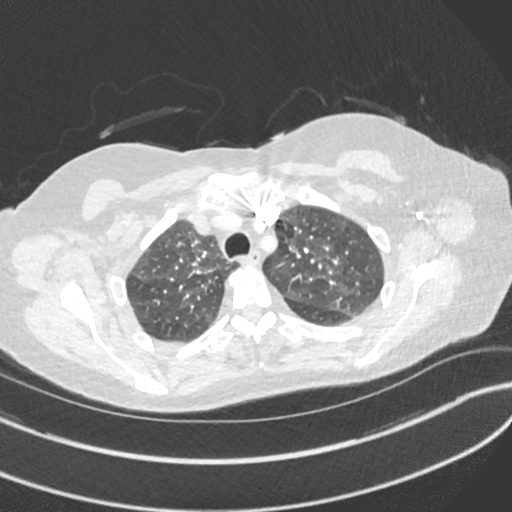
[im 288/349  soft-tissue]
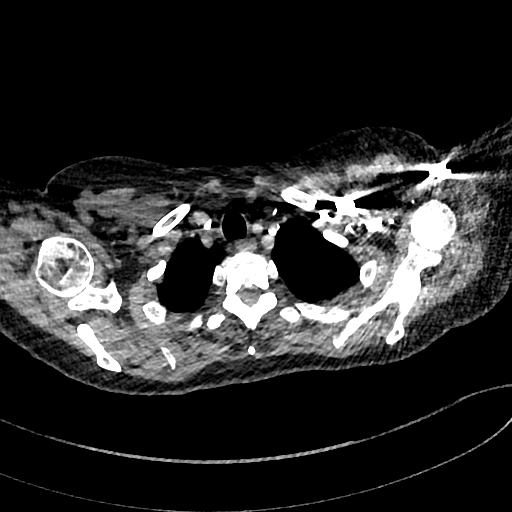
[im 303/349  lung]
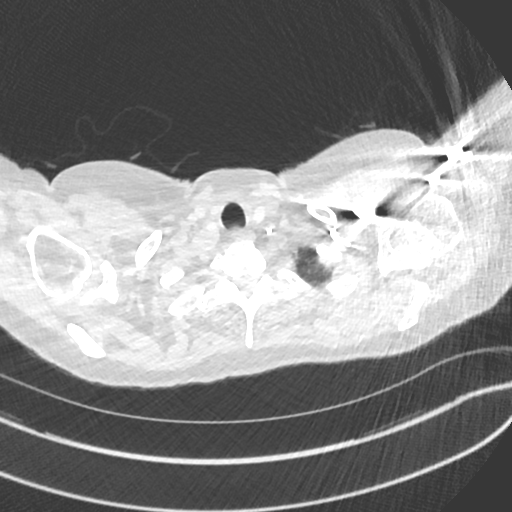
[im 333/349  soft-tissue]
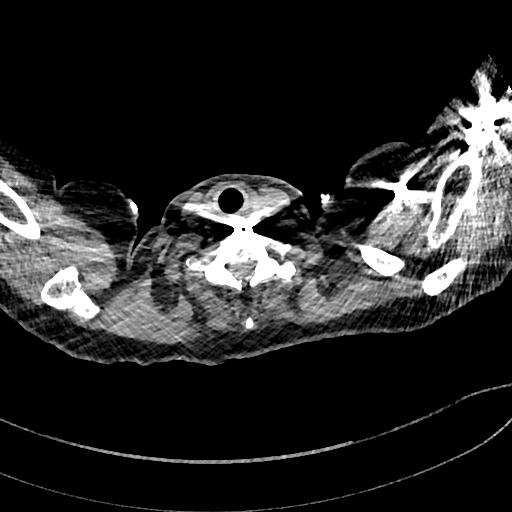

[Series 9: cor · coronal · 0.53mm/px · 3 of 124 slices shown]
[im 31/124  soft-tissue]
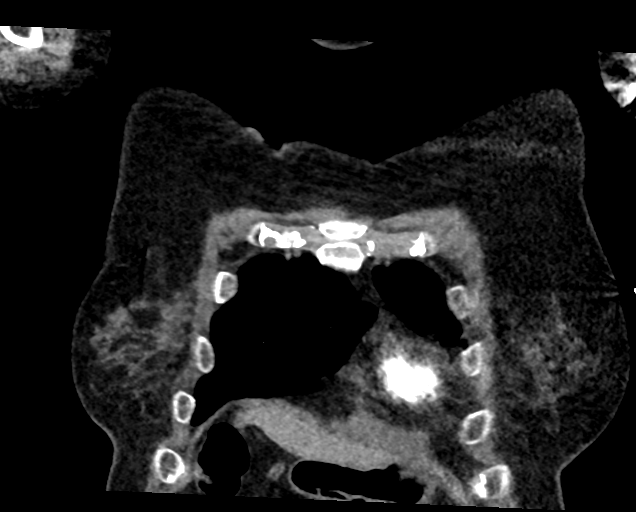
[im 62/124  soft-tissue]
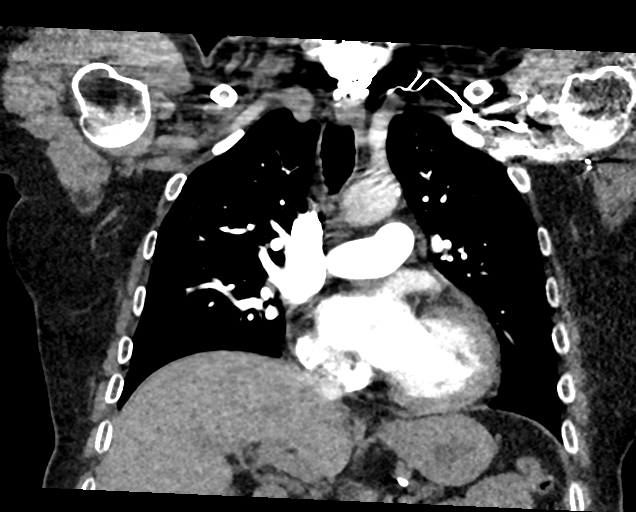
[im 93/124  soft-tissue]
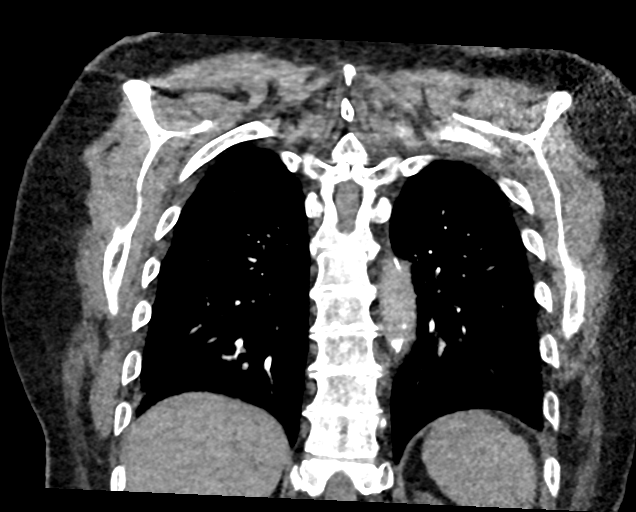

[17 of 46 positions shown; findings below may reference images not displayed]

RADIATION DOSE REDUCTION: This exam was performed according to the
departmental dose-optimization program which includes automated
exposure control, adjustment of the mA and/or kV according to
patient size and/or use of iterative reconstruction technique.

CONTRAST:  75mL OMNIPAQUE IOHEXOL 350 MG/ML SOLN
FINDINGS: CTA CHEST FINDINGS

Cardiovascular: Satisfactory opacification of the pulmonary arteries
to the segmental level. No evidence of pulmonary embolism. Normal
heart size. No pericardial effusion.

Mediastinum/Nodes: No enlarged mediastinal, hilar, or axillary lymph
nodes. Thyroid gland, trachea, and esophagus demonstrate no
significant findings.

Lungs/Pleura: Mild patchy ground-glass opacity is identified
throughout bilateral lungs. Minimal bilateral pleural effusion are
noted.

Musculoskeletal: Degenerative joint changes of the spine identified.

Review of the MIP images confirms the above findings.

CT ABDOMEN and PELVIS FINDINGS

Hepatobiliary: No focal liver abnormality is seen. No gallstones,
gallbladder wall thickening, or biliary dilatation.

Pancreas: Unremarkable. No pancreatic ductal dilatation or
surrounding inflammatory changes.

Spleen: Normal in size without focal abnormality.

Adrenals/Urinary Tract: The bilateral adrenal glands are normal.
There is no hydronephrosis bilaterally. A 2 mm cyst is identified in
the lower pole left kidney. The right kidney is normal. No
hydronephrosis is identified bilaterally. The bladder is normal.

Stomach/Bowel: Stomach is within normal limits. The appendix is not
seen but no inflammation is noted around cecum. No evidence of bowel
wall thickening, distention, or inflammatory changes. There is
diverticulosis of colon.

Vascular/Lymphatic: Aortic atherosclerosis. No enlarged abdominal or
pelvic lymph nodes.

Reproductive: Status post hysterectomy. No adnexal masses.

Other: None.

Musculoskeletal: Degenerative joint changes of spine are noted.

Review of the MIP images confirms the above findings.
IMPRESSION: 1. No evidence of pulmonary embolism.
2. Mild patchy ground-glass opacity throughout bilateral lungs,
which is nonspecific but can be seen with viral pneumonia.
3. Minimal bilateral pleural effusions.
4. No acute abnormality in the abdomen or pelvis.
5. Aortic atherosclerosis.

Aortic Atherosclerosis ([IB]-[IB]).

## 2021-12-20 MED ORDER — ACETAMINOPHEN 500 MG PO TABS
1000.0000 mg | ORAL_TABLET | Freq: Once | ORAL | Status: AC
Start: 2021-12-20 — End: 2021-12-20
  Administered 2021-12-20: 1000 mg via ORAL
  Filled 2021-12-20: qty 2

## 2021-12-20 MED ORDER — KETOROLAC TROMETHAMINE 30 MG/ML IJ SOLN
15.0000 mg | Freq: Once | INTRAMUSCULAR | Status: AC
  Administered 2021-12-20: 15 mg via INTRAVENOUS
  Filled 2021-12-20: qty 1

## 2021-12-20 MED ORDER — IOHEXOL 350 MG/ML SOLN
75.0000 mL | Freq: Once | INTRAVENOUS | Status: AC | PRN
  Administered 2021-12-20: 75 mL via INTRAVENOUS

## 2021-12-20 NOTE — ED Provider Notes (Signed)
I assumed care of this patient approximately 1500.  Please see off on father's note for full details regarding patient's initial evaluation assessment.  In brief patient presents for evaluation of some left lower quadrant abdominal pain associated with some shortness of breath the pain is described as pleuritic. ? ?She had an EKG obtained earlier that was unremarkable for evidence of acute ischemia as well as CT of the chest abdomen pelvis that showed no evidence of PE or clear focal consolidation but did show mild groundglass opacities in both lungs consistent with possible viral pneumonia is minimal bilateral pleural effusions without any acute abdominal pelvic process. ? ?CBC shows hemoglobin of 11.4 and otherwise unremarkable.  CMP is unremarkable.  Lipase not suggestive pancreatitis.  UA is unremarkable.  COVID is negative and troponins nonelevated not suggestive of atypical ischemia or angina.  I suspect some pleurisy from very early bronchitis as patient does not have any significant cough pleuritic component to her discomfort.  She has small pleural effusion and findings on CT consistent with a viral pneumonia or bronchitis.  Will treat with Tylenol and NSAIDs and advised to follow-up with her PCP.  Advised to stop taking her ibuprofen at home which is approximately still safer in her age group.  Discussed returning for any worsening of symptoms.  Discharged in stable condition. ?  ?Lucrezia Starch, MD ?12/20/21 1627 ? ?

## 2021-12-20 NOTE — Discharge Instructions (Addendum)
As we discussed I recommend 1 g of Tylenol every 6 hours as well as 500 mg of naproxen every 12 hours which is safer than ibuprofen.  Please do this instead of the ibuprofen and do not take with the ibuprofen.  Please return to the emergency room for any new or worsening of your symptoms.  Please also have your blood pressure rechecked and follow-up with your PCP next week. ? ?Your CT today showed: ?IMPRESSION: ?1. No evidence of pulmonary embolism. ?2. Mild patchy ground-glass opacity throughout bilateral lungs, ?which is nonspecific but can be seen with viral pneumonia. ?3. Minimal bilateral pleural effusions. ?4. No acute abnormality in the abdomen or pelvis. ?5. Aortic atherosclerosis. ?  ?Aortic Atherosclerosis (ICD10-I70.0). ?

## 2021-12-20 NOTE — ED Triage Notes (Signed)
Pt to ED via POV c/o left upper abdominal pain. Pt states that it started about 1 week ago but got worse last night. Pt states that she called her PCP this morning who advised her to come the ED. Pt denies any other symptoms. Pt is currently in NAD. ?

## 2021-12-20 NOTE — ED Notes (Signed)
See triage note. ? ?Pt does have some c/o of pain on inspiration. Pt denies any recent travel to this RN or prolonged periods of time in car.  ?

## 2021-12-20 NOTE — ED Provider Notes (Signed)
? ?[facility] ?Provider Note ? ? ? Event Date/Time  ? First MD Initiated Contact with Patient 12/20/21 1205   ?  (approximate) ? ? ?History  ? ?Abdominal Pain ? ? ?HPI ? ?Syan Cullimore is a 81 y.o. female patient reports left upper quadrant pain left lower chest pain worse with deep breathing patient has shortness of breath 2.  Seem to came on initially after she removed her jerks.  She does not exactly remember.  Last night she had some constipation with straining at stool and it got much much worse suddenly.  Pain is now severe.  There is been no bleeding no bruising no other problems no fever ? ?  ? ? ?Physical Exam  ? ?Triage Vital Signs: ?ED Triage Vitals  ?Enc Vitals Group  ?   BP 12/20/21 1155 (!) 147/82  ?   Pulse Rate 12/20/21 1155 75  ?   Resp 12/20/21 1155 16  ?   Temp 12/20/21 1155 98 ?F (36.7 ?C)  ?   Temp src --   ?   SpO2 12/20/21 1155 98 %  ?   Weight 12/20/21 1156 130 lb (59 kg)  ?   Height 12/20/21 1156 '5\' 2"'$  (1.575 m)  ?   Head Circumference --   ?   Peak Flow --   ?   Pain Score 12/20/21 1156 10  ?   Pain Loc --   ?   Pain Edu? --   ?   Excl. in [place]? --   ? ? ?Most recent vital signs: ?Vitals:  ? 12/20/21 1300 12/20/21 1330  ?BP: (!) 174/74 (!) 147/61  ?Pulse: 76 70  ?Resp: 17 18  ?Temp:    ?SpO2: 98% 96%  ? ? ? ?General: Awake, alert, looks uncomfortable ?CV:  Good peripheral perfusion.  Heart regular rate and rhythm no audible murmurs ?Resp:  Normal effort.  Lungs are clear with not the deepest breath the deepest breaths cause a lot of pain in the left lower chest anteriorly ?Abd:  No distention.  Tenderness in the left upper quadrant just below the ribs and over the ribs. ?Extremities: No edema ? ? ?ED Results / Procedures / Treatments  ? ?Labs ?(all labs ordered are listed, but only abnormal results are displayed) ?Labs Reviewed  ?CBC WITH DIFFERENTIAL/PLATELET - Abnormal; Notable for the following components:  ?    Result Value  ? Hemoglobin 11.4 (*)   ? MCV 73.8 (*)    ? MCH 23.1 (*)   ? RDW 16.1 (*)   ? All other components within normal limits  ?RESP PANEL BY RT-PCR (FLU A&B, COVID) [id number]  ?LIPASE, BLOOD  ?COMPREHENSIVE METABOLIC PANEL  ?URINALYSIS, ROUTINE W REFLEX MICROSCOPIC  ?TROPONIN I (HIGH SENSITIVITY)  ? ? ? ?EKG ? ?EKG read and interpreted by me shows normal sinus rhythm rate of 75 normal axis no acute ST-T changes ? ? ?RADIOLOGY ? ?CT abdomen read by radiology reviewed in detail by me does not show any acute findings CT chest read by radiology reviewed in detail by me shows a diffuse mild patchy groundglass infiltrate bilaterally throughout all of the lungs.  This does not explain the patient's pain ? ?PROCEDURES: ? ?Critical Care performed:  ? ?Procedures ? ? ?MEDICATIONS ORDERED IN ED: ?Medications  ?iohexol (OMNIPAQUE) 350 MG/ML injection 75 mL (75 mLs Intravenous Contrast Given 12/20/21 1357)  ? ? ? ?IMPRESSION / MDM / ASSESSMENT AND PLAN / ED COURSE  ?I reviewed the triage  vital signs and the nursing notes. ?We will get a COVID test on this patient and troponin although that does not sound cardiac.  And check.  I am going to sign this patient out to the oncoming provider.  It is possible that this pain which would not be explained by the diffuse patchy groundglass infiltrate could just be a pulled muscle but it is quite severe. ? ?The patient is on the cardiac monitor to evaluate for evidence of arrhythmia and/or significant heart rate changes.  None have been seen ? ? ? ?FINAL CLINICAL IMPRESSION(S) / ED DIAGNOSES  ? ?Final diagnoses:  ?Left upper quadrant abdominal pain  ? ? ? ?Rx / DC Orders  ? ?ED Discharge Orders   ? ? None  ? ?  ? ? ? ?Note:  This document was prepared using Dragon voice recognition software and may include unintentional dictation errors. ?  ?Nena Polio, MD ?12/20/21 1510 ? ?

## 2021-12-20 NOTE — ED Notes (Signed)
D/C and OTC meds discussed with pt, pt verbalized understanding. NAD noted. VSS.  ?

## 2021-12-22 ENCOUNTER — Telehealth: Payer: Self-pay

## 2021-12-22 ENCOUNTER — Ambulatory Visit (INDEPENDENT_AMBULATORY_CARE_PROVIDER_SITE_OTHER): Payer: Medicare HMO | Admitting: Family Medicine

## 2021-12-22 VITALS — BP 130/70 | HR 71 | Temp 97.8°F | Ht 61.25 in | Wt 136.1 lb

## 2021-12-22 DIAGNOSIS — R0789 Other chest pain: Secondary | ICD-10-CM | POA: Insufficient documentation

## 2021-12-22 DIAGNOSIS — I700 Atherosclerosis of aorta: Secondary | ICD-10-CM | POA: Diagnosis not present

## 2021-12-22 MED ORDER — ATORVASTATIN CALCIUM 10 MG PO TABS: 10.0000 mg | ORAL_TABLET | Freq: Every day | ORAL | 3 refills | Status: DC

## 2021-12-22 MED ORDER — LIDOCAINE 5 % EX PTCH: 1.0000 | MEDICATED_PATCH | CUTANEOUS | 0 refills | Status: DC

## 2021-12-22 NOTE — Progress Notes (Signed)
? ?Subjective:  ? ?  ?Roberta Curtis is a 81 y.o. female presenting for Follow-up (ED on 12/20/21. Left lower abd pain ) ?  ? ? ?HPI ? ?#Left side pain ?- Taking aleve 500 mg every 12 hours ?- also taking tylenol ?- still in a lot of pain ?- breathing fine ?- some breaths are worse ?- standing - position changes ?- cannot lay on the left side at all ?- pain is just below the left breast  ?- sleeping on her back  ?-  ? ? ?Review of Systems ? ?12/20/2021: ER - LLQ abdominal pain - negative EKG, CT w/o PE. Possible PNA. Suspect pleurisy ? ?Social History  ? ?Tobacco Use  ?Smoking Status Former  ? Packs/day: 1.00  ? Years: 6.00  ? Pack years: 6.00  ? Types: Cigarettes  ? Quit date: 02/20/1970  ? Years since quitting: 51.8  ?Smokeless Tobacco Never  ? ? ? ?   ?Objective:  ?  ?BP Readings from Last 3 Encounters:  ?12/22/21 130/70  ?12/20/21 (!) 152/63  ?11/24/21 119/75  ? ?Wt Readings from Last 3 Encounters:  ?12/22/21 136 lb 2 oz (61.7 kg)  ?12/20/21 130 lb (59 kg)  ?11/24/21 129 lb (58.5 kg)  ? ? ?BP 130/70   Pulse 71   Temp 97.8 ?F (36.6 ?C) (Oral)   Ht 5' 1.25" (1.556 m)   Wt 136 lb 2 oz (61.7 kg)   SpO2 98%   BMI 25.51 kg/m?  ? ? ?Physical Exam ?Constitutional:   ?   General: She is not in acute distress. ?   Appearance: She is well-developed. She is not diaphoretic.  ?HENT:  ?   Right Ear: External ear normal.  ?   Left Ear: External ear normal.  ?   Nose: Nose normal.  ?Eyes:  ?   Conjunctiva/sclera: Conjunctivae normal.  ?Cardiovascular:  ?   Rate and Rhythm: Normal rate and regular rhythm.  ?   Heart sounds: Murmur heard.  ?Pulmonary:  ?   Effort: Pulmonary effort is normal. No respiratory distress.  ?   Breath sounds: Normal breath sounds. No wheezing.  ?Chest:  ? ? ?Abdominal:  ?   General: Abdomen is flat.  ?   Palpations: Abdomen is soft.  ?   Tenderness: There is abdominal tenderness. There is no guarding or rebound.  ?   Comments: Wincing in pain and holding LLQ near ribcage  ?Musculoskeletal:  ?    Cervical back: Neck supple.  ?Skin: ?   General: Skin is warm and dry.  ?   Capillary Refill: Capillary refill takes less than 2 seconds.  ?Neurological:  ?   Mental Status: She is alert. Mental status is at baseline.  ?Psychiatric:     ?   Mood and Affect: Mood normal.     ?   Behavior: Behavior normal.  ? ? ? ?Lab Results  ?Component Value Date  ? CHOL 155 02/18/2021  ? HDL 51.00 02/18/2021  ? [place] 91 02/18/2021  ? TRIG 64.0 02/18/2021  ? CHOLHDL 3 02/18/2021  ? ?The 10-year ASCVD risk score (Arnett DK, et al., 2019) is: 24.8% ?  Values used to calculate the score: ?    Age: 81 years ?    Sex: Female ?    Is Non-Hispanic African American: No ?    Diabetic: No ?    Tobacco smoker: No ?    Systolic Blood Pressure: 710 mmHg ?    Is BP treated: No ?  HDL Cholesterol: 51 mg/dL ?    Total Cholesterol: 155 mg/dL ? ? ?   ?Assessment & Plan:  ? ?Problem List Items Addressed This Visit   ? ?  ? Cardiovascular and Mediastinum  ? Aortic 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[place])  ?  Reviewed recent CT scan with noted finding.  Advised starting aspirin 81 mg as well as atorvastatin 10 mg. ? ?  ?  ? Relevant Medications  ? atorvastatin (LIPITOR) 10 MG tablet  ?  ? Other  ? Left-sided chest wall pain - Primary  ?  Reviewed CT chest abdomen and pelvis as well as recent ER work-up.  No comments regarding ribs or signs of fracture on that.  Based on reassuring imaging location of symptoms with worsening with movement suspect MSK cause.  Advised continued NSAIDs and Tylenol.  Lidocaine patches sent to pharmacy to use.  If no improvement may consider steroids however given osteopenia would want to use with caution.  Physical therapy if symptoms do not resolve in 2 weeks. ? ?  ?  ? Relevant Medications  ? lidocaine (LIDODERM) 5 %  ? ? ? ?Return if symptoms worsen or fail to improve. ? ?Lesleigh Noe, MD ? ? ? ?

## 2021-12-22 NOTE — Assessment & Plan Note (Addendum)
Reviewed CT chest abdomen and pelvis as well as recent ER work-up.  No comments regarding ribs or signs of fracture on that.  Based on reassuring imaging location of symptoms with worsening with movement suspect MSK cause.  Advised continued NSAIDs and Tylenol.  Lidocaine patches sent to pharmacy to use.  If no improvement may consider steroids however given osteopenia would want to use with caution.  Physical therapy if symptoms do not resolve in 2 weeks. ?

## 2021-12-22 NOTE — Assessment & Plan Note (Signed)
Reviewed recent CT scan with noted finding.  Advised starting aspirin 81 mg as well as atorvastatin 10 mg. ?

## 2021-12-22 NOTE — Patient Instructions (Addendum)
Left side pain ?- reassuring imaging ?- continue aleve 500 mg twice daily and tylenol ?- try lidocaine patches in addition to this ?-- call if no improvement over next 2 days, could consider short course of steroids ? ?Aortic Atherosclerosis  ?- start atorvastatin (when you start feeling better) ?- start baby aspirin 81 mg daily ? ?If no improvement - may need to consider physical therapy ?

## 2021-12-22 NOTE — Telephone Encounter (Addendum)
[place] Night - Client ?Nonclinical Telephone Record  ?Access[facility]urse? ?Client [place] Night - Client ?Client Site [place] ?Provider Waunita Schooner- MD ?Contact Type Call ?Who Is Calling Patient / Member / Family / Caregiver ?Caller Name Onie Kasparek ?Caller Phone Number [phone] ?Patient Name Roberta Curtis ?Patient DOB 1941-03-18 ?Call Type Message Only Information Provided ?Reason for Call Request to Schedule Office Appointment ?Initial Comment caller states she called earlier because she had pain on her left side. She was instructed to go to ?the ED, and make a follow up appt. ?Patient request to speak to RN No ?Additional Comment Office hours provided. Triage declined. ?Disp. Time Disposition Final User ?12/20/2021 6:36:41 PM General Information Provided Yes Dotts, Daneen ?Call Closed By: Everlene Farrier ?Transaction Date/Time: 12/20/2021 6:32:24 PM (ET ? ? ? ? ? ?[place] Night - Client ?TELEPHONE ADVICE RECORD ?AccessNurse? ?Patient ?Name: ?Roberta Curtis ?[place] ?Gender: Female ?DOB: [birth year] ?Age: 6 Y 26 M 16 D ?Return ?Phone ?Number: ?[phone] ?(Primary) ?Address: ?City/ ?State/ ?Zip: [name] ? [address] ?Client [place] Night - Client ?Client Site [place] ?Provider Waunita Schooner- MD ?Contact Type Call ?Who Is Calling Patient / Member / Family / Caregiver ?Call Type Triage / Clinical ?Relationship To Patient Self ?Return Phone Number [phone] (Primary) ?Chief Complaint CHEST PAIN - pain, pressure, heaviness or ?tightness ?Reason for Call Symptomatic / Request for Health Information ?Initial Comment Caller states she has a pain on her left side under ?her ribs and its bothering her breathing. ?Translation No ?Nurse Assessment ?Nurse: Ysidro Evert, RN, Levada Dy Date/Time Eilene Ghazi Time): 12/20/2021 10:29:58 AM ?Confirm and document reason for  call. If ?symptomatic, describe symptoms. ?---Caller states she is having pain in her rib cage on the ?left that hurts worse when she takes a deep breath. ?Does the patient have any new or worsening ?symptoms? ---Yes ?Will a triage be completed? ---Yes ?Related visit to physician within the last 2 weeks? ---No ?Does the PT have any chronic conditions? (i.e. ?diabetes, asthma, this includes High risk factors for ?pregnancy, etc.) ?---No ?Is this a behavioral health or substance abuse call? ---No ?Guidelines ?Guideline Title Affirmed Question Affirmed Notes Nurse Date/Time (Eastern ?Time) ?Breathing Difficulty [1] MODERATE ?difficulty breathing ?(e.g., speaks in ?phrases, SOB even at ?rest, pulse 100-120) ?AND [2] NEW-onset ?or WORSE than ?normal ?Earleen Reaper 12/20/2021 10:32:17 ?AM ?Disp. Time (Eastern ?Time) Disposition Final User ?12/20/2021 10:28:27 AM Send to Urgent Maxie Better, Andee Poles ?PLEASE NOTE: All timestamps contained within this report are represented as [place] Standard Time. ?CONFIDENTIALTY NOTICE: This fax transmission is intended only for the addressee. It contains information that is legally privileged, confidential or ?otherwise protected from use or disclosure. If you are not the intended recipient, you are strictly prohibited from reviewing, disclosing, copying using ?or disseminating any of this information or taking any action in reliance on or regarding this information. If you have received this fax in error, please ?notify us immediately by telephone so that we can arrange for its return to [place]. Phone: [phone], Toll-Free: [phone], Fax: [phone] ?Page: 2 of 2 ?Call Id: [id number] ?12/20/2021 10:34:30 AM Go to ED Now Yes Ysidro Evert, RN, Levada Dy ?Caller Disagree/Comply Comply ?Caller Understands Yes ?PreDisposition Did not know what to do ?Care Advice Given Per Guideline ?GO TO ED NOW: * You need to be seen in the Emergency Department. * Go to the ED at ___________ Hospital. *  Leave  now. ?Drive carefully. * Another adult should drive. * Patient should not delay going to the emergency department. CALL EMS 911 IF: * ?Call EMS if you become worse. CARE ADVICE given per Breathing Difficulty (Adult) guideline. ?Referrals ?[place] ?Per appt notes pt already has appt scheduled with Dr Einar Pheasant on 12/22/21 at 9:20. Sending note to Dr Einar Pheasant. ? ? ? ? ? ? ? ? ? ? ? ? ? ? ? ? ? ? ? ? ? ? ? ? ? ? ? ? ? ? ? ?

## 2021-12-22 NOTE — Telephone Encounter (Signed)
See note from today

## 2021-12-23 ENCOUNTER — Telehealth: Payer: Self-pay

## 2021-12-23 NOTE — Telephone Encounter (Signed)
Heating pad would be helpful ? ?She can also get over the counter Salonpas which is topical lidocaine similar to what was ordered ?

## 2021-12-23 NOTE — Telephone Encounter (Signed)
Mychart sent to pt so she would have a message to refer to.  ?

## 2021-12-23 NOTE — Telephone Encounter (Signed)
[place] Night - Client ?Nonclinical Telephone Record  ?AccessNurse? ?Client [place] Night - Client ?Client Site [place] ?Provider Waunita Schooner- MD ?Contact Type Call ?Who Is Calling Patient / Member / Family / Caregiver ?Caller Name [place] ?Caller Phone Number [phone] ?Patient Name Roberta Curtis ?Patient DOB 03/30/1941 ?Call Type Message Only Information Provided ?Reason for Call Request for General Office Information ?Initial Comment Caller states she went to go pick up her prescription from the pharmacy and the cost was high ?like the doctor thought, so she is calling to let Dr. Einar Pheasant know. ?Additional Comment She was told to get stick on heating pads and she would like to know if that's what she was told ?as well. ?Disp. Time Disposition Final User ?12/22/2021 7:28:08 PM General Information Provided Yes Benetta Spar ?Call Closed By: Benetta Spar ?Transaction Date/Time: 12/22/2021 7:24:38 PM (ET ?

## 2021-12-24 NOTE — Telephone Encounter (Signed)
Spoke to pt and she states that she got the 4% OTC lidocaine patches, which seem to be helping and she will use the heating pad as needed. Pt also states that she is starting PT tomorrow morning at [facility].  ?

## 2021-12-25 ENCOUNTER — Ambulatory Visit: Payer: Medicare HMO | Attending: Pain Medicine

## 2021-12-25 DIAGNOSIS — R293 Abnormal posture: Secondary | ICD-10-CM | POA: Diagnosis present

## 2021-12-25 DIAGNOSIS — M542 Cervicalgia: Secondary | ICD-10-CM | POA: Diagnosis present

## 2021-12-25 NOTE — Addendum Note (Signed)
Addended by: Judene Companion on: 12/25/2021 12:18 PM ? ? Modules accepted: Orders ? ?

## 2021-12-25 NOTE — Patient Instructions (Signed)
?   Access Code: [email or web address] ?URL: [email or web address] ?Date: 12/25/2021 ?Prepared by: Janna Arch ? ?Exercises ?- Seated Scapular Retraction  - 1 x daily - 7 x weekly - 2 sets - 10 reps - 5 hold ?- Seated Cervical Sidebending Stretch  - 1 x daily - 7 x weekly - 2 sets - 2 reps - 30 hold ?- Seated Cervical Flexion Stretch with Finger Support Behind Neck  - 1 x daily - 7 x weekly - 2 sets - 10 reps - 5 hold ? ? ?

## 2021-12-25 NOTE — Therapy (Addendum)
[place] ?[place], [place], [address] ?Phone: [phone]   Fax:  [phone] ? ?Physical Therapy Evaluation ? ?Patient Details  ?Name: Roberta Curtis ?MRN: [id number] ?Date of Birth: April 04, 1941 ?Referring Provider (PT): Micael Hampshire, Deitra L ? ? ?Encounter Date: 12/25/2021 ? ? PT End of Session - 12/25/21 1203   ? ? Visit Number 1   ? Number of Visits 16   ? Date for PT Re-Evaluation 02/19/22   ? Authorization Type 1/10 eval 4/20   ? PT Start Time 1100   ? PT Stop Time 1154   ? PT Time Calculation (min) 54 min   ? Equipment Utilized During Treatment --   ? Activity Tolerance Patient tolerated treatment well   ? Behavior During Therapy [facility] for tasks assessed/performed   ? ?  ?  ? ?  ? ? ?Past Medical History:  ?Diagnosis Date  ? 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 GERD (gastroesophageal reflux disease)   ? Heart murmur   ? Osteopenia   ? Thrush   ? ? ?Past Surgical History:  ?Procedure Laterality Date  ? BONE MARROW TRANSPLANT    ? BREAST LUMPECTOMY Left   ? fatty tumor,1970's  ? CATARACT EXTRACTION    ? CESAREAN SECTION    ? HYSTEROTOMY    ? LYMPH GLAND EXCISION    ? MENISCUS REPAIR Right   ? NECK SURGERY    ? SALPINGECTOMY    ? WISDOM TOOTH EXTRACTION    ? ? ?There were no vitals filed for this visit. ? ? ? Subjective Assessment - 12/25/21 1159   ? ? Subjective Patient is referred to PT for cervicalgia.   ? Pertinent History Patient is referred to PT for cervicalgia. Referral includes orders for E stim and manual therapy.  PMH acute myeloblastic leukemia, GERD, heart murmur, osteopenia, thrush, burn, osteopenia, CKD stage II, and chronic nausea. 15 years ago patient fell and now has a titanium plate in her neck.  Patient moved from [place] to [facility] in February 2021. Patient has pain from bilateral shoulders, has had two rounds of shots with second round on Tuesday the 18th. Pain has been going on for a month   ? Limitations  Sitting;Standing;Walking;Writing   ? How long can you sit comfortably? neck pain discomfort   ? How long can you stand comfortably? neck pain discomfort   ? How long can you walk comfortably? neck pain discomfort   ? Diagnostic tests x ray   ? Patient Stated Goals reduce pain   ? Currently in Pain? Yes   ? Pain Score 5    ? Pain Location Neck   ? Pain Orientation Right;Left   ? Pain Descriptors / Indicators Aching   ? Pain Type Acute pain   ? Pain Onset More than a month ago   ? Pain Frequency Intermittent   ? ?  ?  ? ?  ? ? ? ? ? [facility] PT Assessment - 12/25/21 0001   ? ?  ? Assessment  ? Medical Diagnosis cervicalgia   ? Referring Provider (PT) Micael Hampshire, Deitra L   ? Onset Date/Surgical Date --   about a month ago  ? Hand Dominance Right   ? Prior Therapy yes for her back   ?  ? Precautions  ? Precautions None   ?  ? Restrictions  ? Weight Bearing Restrictions No   ?  ? Balance Screen  ? Has  the patient fallen in the past 6 months No   ? Has the patient had a decrease in activity level because of a fear of falling?  No   ? Is the patient reluctant to leave their home because of a fear of falling?  No   ?  ? Home Environment  ? Living Environment Private residence   ? Type of Home Other(Comment)   condo  ? Home Access Stairs to enter   ? Home Layout Able to live on main level with bedroom/bathroom   ?  ? Prior Function  ? Level of Independence Independent   ? 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  ? Observation/Other Assessments  ? Focus on Therapeutic Outcomes (FOTO)  63%   ? ?  ?  ? ?  ? ? ? ? ? ? ? ?SUBJECTIVE ? ?Chief complaint: neck pain   ?Onset:  a month ago, has history of flare up through.  ?Referring FW:[email or web address] ?DX:AJOINOMV, Gwenyth Bouillon MD ?Pain:4-5 /10 Present, 0/10 Best, 11/10 Worst: ?Aggravating factors: ?Easing factors:lay flat on back  ?24 hour pain behavior: worsens throughout the day  ?Recent neck trauma: No ?Prior history of neck injury or pain: Yes ?Pain quality: pain quality:  aching ?Radiating pain: Yes  ?Numbness/Tingling: No ?Follow-up appointment with MD: No ?Dominant hand: right ?Imaging: Yes  ?  ?OBJECTIVE ? ?Mental Status ?Patient is oriented to person, place and time.  ?Recent memory is intact.  ?Remote memory is intact.  ?Attention span and concentration are intact.  ?Expressive speech is intact.  ?Patient's fund of knowledge is within normal limits for educational level. ? ?SENSATION: ?Grossly intact to light touch bilateral UE as determined by testing dermatomes C2-T2 ?Proprioception and hot/cold testing deferred on this date ?  ?MUSCULOSKELETAL: ?Tremor: None ?Bulk: Normal ?Tone: Normal ? ?Posture ? upper trap tension  ? ?Palpation ? upper trap and levator scap tension with lateral cervical musculature tightness ? ?Strength ?R/L ?4-/4- Shoulder flexion (anterior deltoid/pec major/coracobrachialis, axillary n. (C5/6) and musculocutaneous n. (C5-7)) ?4-/4- Shoulder abduction (deltoid/supraspinatus, axillary/suprascapular n, C5) ?4-/4- Shoulder external rotation (infraspinatus/teres minor) ?4-/4-Shoulder internal rotation (subcapularis/lats/pec major) ?4-/4- Shoulder extension (posterior deltoid, lats, teres major, axillary/thoracodorsal n.) ?4-/4- Elbow flexion (biceps brachii, brachialis, brachioradialis, musculoskeletal n, C5/6) ?4-/4- Elbow extension (triceps, radial n, C7) ?3+/3+ Wrist Extension (C6/7) ?3+/3+ Wrist Flexion (C6/7) ? ?Cervical isometrics are strong in all directions; ? ?AROM ?R/L ?45 Cervical Flexion ?30 Cervical Extension ?25/30 Cervical Lateral Flexion ?39/40 Cervical Rotation ?*Indicates pain, overpressure performed unless otherwise indicated ? ? ? ?Repeated Movements ?No centralization or peripheralization of symptoms with repeated cervical protraction and retraction.  ?  ?Muscle Length ?Upper Trap: limited bilaterally  ? ?Pectoral : L 13 R 13 ?  ?Passive Accessory Intervertebral Motion (PAIVM) ?Generally hypomobile throughout ? ?Pain with C2-3, C4-5 Upa  and R and L C4-5 ? ?SPECIAL TESTS ?Spurlings A (ipsilateral lateral flexion/axial compression): R: Negative L: Negative ? ?Distraction Test: Positive  ?Hoffman Sign (cervical cord compression): R: Negative L: Negative ? ? ?FOTO: 63%; predicted 67% at discharge ?NDI: 26% ? ? ?ASSESSMENT ?Clinical Impression: Pt is a pleasant 81 year-old female referred for neck pain. PT examination reveals deficits in her cervical ROM. Her cervical spine is hypomobile and painful to mobilizations. Significant upper trap and lateral cervical musculature tension present and an area for focused muscle tissue lengthening . Pt presents with deficits in strength, mobility, range of motion, and pain. 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  ? ? ?PLAN ?Next Visit:increase ROM, stretching, posture ?HEP:  ? ? ? ? Access Code: [email or web address] ?URL: [email or web address] ?Date: 12/25/2021 ?Prepared by: Janna Arch ? ?Exercises ?- Seated Scapular Retraction  - 1 x daily - 7 x weekly - 2 sets - 10 reps - 5 hold ?- Seated Cervical Sidebending Stretch  - 1 x daily - 7 x weekly - 2 sets - 2 reps - 30 hold ?- Seated Cervical Flexion Stretch with Finger Support Behind Neck  - 1 x daily - 7 x weekly - 2 sets - 10 reps - 5 hold ? ? ? ? ? ? ?Objective measurements completed on examination: See above findings.  ? ? ? ? ? ? ? ? ? ? ? ? ? ? PT Education - 12/25/21 1203   ? ? Education Details goals, POC, HEP   ? Person(s) Educated Patient   ? Methods Explanation;Demonstration;Verbal cues;Tactile cues   ? Comprehension Verbalized understanding;Returned demonstration;Verbal cues required;Tactile cues required   ? ?  ?  ? ?  ? ? ? PT Short Term Goals - 12/25/21 1210   ? ?  ? PT SHORT TERM GOAL #1  ? Title Pt will be independent with HEP in order to improve strength and decrease back pain in order to improve pain-free function at home and work.   ? Baseline 4/20: HEP given    ? Time 4   ? Period Weeks   ? Status New   ? Target Date 01/22/22   ? ?  ?  ? ?  ? ? ? ? PT Long Term Goals - 12/25/21 1211   ? ?  ? PT LONG TERM GOAL #1  ? Title Patient will increase FOTO score to equal to or greater

## 2021-12-26 ENCOUNTER — Telehealth: Payer: Self-pay | Admitting: Family Medicine

## 2021-12-26 NOTE — Telephone Encounter (Signed)
Pt states she is feeling better and pain is gone by 95%. Wants to speak with the nurse when possible.  ?

## 2021-12-29 ENCOUNTER — Encounter: Payer: Self-pay | Admitting: Urology

## 2021-12-29 ENCOUNTER — Ambulatory Visit (INDEPENDENT_AMBULATORY_CARE_PROVIDER_SITE_OTHER): Payer: Medicare HMO | Admitting: Urology

## 2021-12-29 VITALS — BP 165/76 | HR 75 | Ht 61.0 in | Wt 137.0 lb

## 2021-12-29 DIAGNOSIS — N3946 Mixed incontinence: Secondary | ICD-10-CM | POA: Diagnosis not present

## 2021-12-29 MED ORDER — GEMTESA 75 MG PO TABS: 75.0000 mg | ORAL_TABLET | Freq: Every day | ORAL | 11 refills | Status: DC

## 2021-12-29 NOTE — Patient Instructions (Addendum)
Stop Myrbetriq, start Gemtesa samples ? ? ?

## 2021-12-29 NOTE — Progress Notes (Signed)
? ?12/29/2021 ?9:19 AM  ? ?Roberta Curtis ?Apr 10, 1941 ?[id number] ? ?Referring provider: Lesleigh Noe, MD ?[place],  [place] [address] ? ?Chief Complaint  ?Patient presents with  ? Follow-up  ?  5wk  ? ? ?HPI: ?I was consulted to assess the patient's urinary incontinence worsening over the last year or longer.  She denies stress incontinence.  She has urge incontinence.  She wears 1 pad a day that can be damp or moderately wet.  Sometimes it is higher volume.  She rarely may have a little bit of dampness at night while she sleeping ?  ?She voids every 3-4 hours gets up once at night. ?  ?Patient primarily has urge incontinence.  I will perform a cystoscopy in approximately 5 or 6 weeks on Myrbetriq 50 mg samples and prescription.  Call if urine culture positive ?  ?Myrbetriq worked well but she switched to oxybutynin due to $100 co-pay.  Last urine culture positive and treated ?  ?Dramatic improvement on oxybutynin.  Minimal leakage.  No nocturia.  Frequency much improved.  Clinically not infected ?  ?Cystoscopy: Normal ? ?Patient is currently having 1 pad a day urge incontinence.  She is on tolterodine.  Clinically not infected ?By mid afternoon pill is no longer working well   ? ?Patient is wearing 1 pad a day that is larger.  She was go back on Myrbetriq since the co-pay may have changed.  I gave samples and prescription.  All discussed 3 refractory treatments next time pending result ? ?Today ?Patient is partially better on the Myrbetriq.  She can be damp most days with a little bit urge incontinence and no bedwetting.  Sometimes it is higher volume.  Clinically not infected and frequency stable ?  ? ? ?PMH: ?Past Medical History:  ?Diagnosis Date  ? 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 GERD (gastroesophageal reflux disease)   ? Heart murmur   ? Osteopenia   ? Thrush   ? ? ?Surgical History: ?Past Surgical History:  ?Procedure Laterality Date  ? BONE MARROW TRANSPLANT    ? BREAST LUMPECTOMY  Left   ? fatty tumor,1970's  ? CATARACT EXTRACTION    ? CESAREAN SECTION    ? HYSTEROTOMY    ? LYMPH GLAND EXCISION    ? MENISCUS REPAIR Right   ? NECK SURGERY    ? SALPINGECTOMY    ? WISDOM TOOTH EXTRACTION    ? ? ?Home Medications:  ?Allergies as of 12/29/2021   ? ?   Reactions  ? Jadenu [deferasirox] Other (See Comments)  ? Elevated Iron, headache and rash  ? Levofloxacin Other (See Comments)  ? Ondansetron Hcl Other (See Comments)  ? Codeine Palpitations  ? Oxycodone-acetaminophen Palpitations  ? Penicillins Rash  ? ?  ? ?  ?Medication List  ?  ? ?  ? Accurate as of December 29, 2021  9:19 AM. If you have any questions, ask your nurse or doctor.  ?  ?  ? ?  ? ?albuterol 108 (90 Base) MCG/ACT inhaler ?Commonly known as: Ventolin HFA ?Inhale 1-2 puffs into the lungs as needed for wheezing or shortness of breath. ?  ?atorvastatin 10 MG tablet ?Commonly known as: LIPITOR ?Take 1 tablet (10 mg total) by mouth daily. ?  ?BIOTIN PO ?Take 1,000 mcg by mouth daily. ?  ?calcium carbonate 1250 (500 Ca) MG tablet ?Commonly known as: OS-CAL - dosed in mg of elemental calcium ?Take 1 tablet by mouth. ?  ?  esomeprazole 20 MG capsule ?Commonly known as: [place] ?TAKE 1 CAPSULE (20 MG TOTAL) BY MOUTH DAILY AT 12 NOON. ?  ?hydrocortisone 2.5 % cream ?Apply twice daily, mixed with ketoconazole for 1-2 weeks ?  ?ibandronate 150 MG tablet ?Commonly known as: BONIVA ?TAKE 1 TABLET (150 MG TOTAL) BY MOUTH EVERY 30 (THIRTY) DAYS. ?  ?ketoconazole 2 % cream ?Commonly known as: NIZORAL ?Apply to breast folds twice daily, mixed with hydrocortisone cream,  for 1-2 weeks, decrease to once daily as directed ?  ?lidocaine 5 % ?Commonly known as: Lidoderm ?Place 1 patch onto the skin daily. Remove & Discard patch within 12 hours or as directed by MD ?  ?methocarbamol 500 MG tablet ?Commonly known as: ROBAXIN ?Take by mouth. ?  ?mirabegron ER 50 MG Tb24 tablet ?Commonly known as: MYRBETRIQ ?Take 1 tablet (50 mg total) by mouth daily. ?  ?POTASSIUM  PO ?Take 99 mg by mouth daily. ?  ?prochlorperazine 10 MG tablet ?Commonly known as: COMPAZINE ?Take 1 tablet (10 mg total) by mouth daily. ?  ?VITAMIN D PO ?Take 50,000 mcg by mouth daily. ?  ? ?  ? ? ?Allergies:  ?Allergies  ?Allergen Reactions  ? Jadenu [Deferasirox] Other (See Comments)  ?  Elevated Iron, headache and rash  ? Levofloxacin Other (See Comments)  ? Ondansetron Hcl Other (See Comments)  ? Codeine Palpitations  ? Oxycodone-Acetaminophen Palpitations  ? Penicillins Rash  ? ? ?Family History: ?Family History  ?Problem Relation Age of Onset  ? Retinitis pigmentosa Mother   ? Stroke Mother   ? Liver cancer Father   ? Heart disease Father   ? Heart disease Brother   ? Blindness Brother   ? ? ?Social History:  reports that she quit smoking about 51 years ago. Her smoking use included cigarettes. She has a 6.00 pack-year smoking history. She has never used smokeless tobacco. She reports that she does not currently use alcohol. She reports that she does not use drugs. ? ?ROS: ?  ? ?  ? ?  ? ?  ? ?  ? ?  ? ?  ? ?  ? ?  ? ?  ? ?  ? ?  ? ?  ? ?Physical Exam: ?BP (!) 165/76   Pulse 75   Ht '5\' 1"'$  (1.549 m)   Wt 62.1 kg   BMI 25.89 kg/m?   ?Constitutional:  Alert and oriented, No acute distress. ? ?Laboratory Data: ?Lab Results  ?Component Value Date  ? WBC 8.1 12/20/2021  ? HGB 11.4 (L) 12/20/2021  ? HCT 36.4 12/20/2021  ? MCV 73.8 (L) 12/20/2021  ? PLT 214 12/20/2021  ? ? ?Lab Results  ?Component Value Date  ? CREATININE 0.79 12/20/2021  ? ? ?No results found for: PSA ? ?No results found for: TESTOSTERONE ? ?Lab Results  ?Component Value Date  ? HGBA1C 6.0 (A) 11/05/2021  ? ? ?Urinalysis ?   ?Component Value Date/Time  ? COLORURINE STRAW (A) 12/20/2021 1300  ? APPEARANCEUR CLEAR (A) 12/20/2021 1300  ? APPEARANCEUR Cloudy (A) 11/24/2021 0911  ? LABSPEC 1.041 (H) 12/20/2021 1300  ? PHURINE 6.0 12/20/2021 1300  ? GLUCOSEU NEGATIVE 12/20/2021 1300  ? HGBUR NEGATIVE 12/20/2021 1300  ? BILIRUBINUR NEGATIVE  12/20/2021 1300  ? BILIRUBINUR Negative 11/24/2021 0911  ? KETONESUR NEGATIVE 12/20/2021 1300  ? PROTEINUR NEGATIVE 12/20/2021 1300  ? NITRITE NEGATIVE 12/20/2021 1300  ? LEUKOCYTESUR NEGATIVE 12/20/2021 1300  ? ? ?Pertinent Imaging: ? ? ?Assessment & Plan: Patient has mild  urge incontinence partially responding to multiple medication.  She chose not to see physical therapy.  She will try Gemtesa.  I gave her samples and 3 handouts describing all 3 refractory treatments.  She understands I think that sacral nerve stimulation may be a bit invasive for her severity of incontinence.  She understands that there are not necessarily complete responding therapies.  Handouts given.  Reassess 6 weeks full templates discussed ? ?There are no diagnoses linked to this encounter. ? ?No follow-ups on file. ? ?Reece Packer, MD ? ?[facility] ?[address], [address] ?[place], [place] [address] ?[phone] ?  ?

## 2021-12-30 ENCOUNTER — Ambulatory Visit: Payer: Medicare HMO | Admitting: Physical Therapy

## 2021-12-30 ENCOUNTER — Encounter: Payer: Self-pay | Admitting: Physical Therapy

## 2021-12-30 DIAGNOSIS — M542 Cervicalgia: Secondary | ICD-10-CM

## 2021-12-30 DIAGNOSIS — R293 Abnormal posture: Secondary | ICD-10-CM

## 2021-12-30 NOTE — Therapy (Signed)
[place] ?[facility] ?[place], [place], [address] ?Phone: [phone]   Fax:  [phone] ? ?Physical Therapy Treatment ? ?Patient Details  ?Name: Roberta Curtis ?MRN: [id number] ?Date of Birth: 1941/01/24 ?Referring Provider (PT): Micael Hampshire, Deitra L ? ? ?Encounter Date: 12/30/2021 ? ? PT End of Session - 12/31/21 1017   ? ? Visit Number 2   ? Number of Visits 16   ? Date for PT Re-Evaluation 02/19/22   ? Authorization Type 1/10 eval 4/20   ? PT Start Time 1348   ? PT Stop Time 1430   ? PT Time Calculation (min) 42 min   ? Activity Tolerance Patient tolerated treatment well   ? Behavior During Therapy [facility] for tasks assessed/performed   ? ?  ?  ? ?  ? ? ?Past Medical History:  ?Diagnosis Date  ? 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 GERD (gastroesophageal reflux disease)   ? Heart murmur   ? Osteopenia   ? Thrush   ? ? ?Past Surgical History:  ?Procedure Laterality Date  ? BONE MARROW TRANSPLANT    ? BREAST LUMPECTOMY Left   ? fatty tumor,1970's  ? CATARACT EXTRACTION    ? CESAREAN SECTION    ? HYSTEROTOMY    ? LYMPH GLAND EXCISION    ? MENISCUS REPAIR Right   ? NECK SURGERY    ? SALPINGECTOMY    ? WISDOM TOOTH EXTRACTION    ? ? ?There were no vitals filed for this visit. ? ? Subjective Assessment - 12/30/21 1356   ? ? Subjective Patient reports doing okay. She is a little sore today after working;   ? Pertinent History Patient is referred to PT for cervicalgia. Referral includes orders for E stim and manual therapy.  PMH acute myeloblastic leukemia, GERD, heart murmur, osteopenia, thrush, burn, osteopenia, CKD stage II, and chronic nausea. 15 years ago patient fell and now has a titanium plate in her neck.  Patient moved from [place] to [facility] in February 2021. Patient has pain from bilateral shoulders, has had two rounds of shots with second round on Tuesday the 18th. Pain has been going on for a month   ? Limitations Sitting;Standing;Walking;Writing    ? How long can you sit comfortably? neck pain discomfort   ? How long can you stand comfortably? neck pain discomfort   ? How long can you walk comfortably? neck pain discomfort   ? Diagnostic tests x ray   ? Patient Stated Goals reduce pain   ? Currently in Pain? Yes   ? Pain Score 5    ? Pain Location Neck   ? Pain Orientation Right;Left;Lower   ? Pain Descriptors / Indicators Aching;Sore   ? Pain Type Acute pain   ? Pain Onset More than a month ago   ? Pain Frequency Intermittent   ? Aggravating Factors  unsure, nothing- "its just there"   ? Pain Relieving Factors stretches have been helping, massage gun   ? Effect of Pain on Daily Activities decreased activity tolerance;   ? ?  ?  ? ?  ? ? ? ? ?TREATMENT: ?Seated in chair: ?Upper trap stretch 20 sec hold x1 rep each direction ?Lateral flexion, head rotation into flexion and then lateral flexion 5 sec hold x4 reps each direction- min VCs for proper positioning ? ?Seated  ?Posterior shoulder roll x15 reps with good positioning and control; ? ?Patient positioning in hooklying with moist heat  to cervical paraspinals concurrent with manual therapy: ?PT performed soft tissue massage to bilateral cervical paraspinals focusing on upper trap and levator scapulae, also performed ischemic trigger point release to alleviate trigger points x15 min with good tolerance ?PT performed passive UT stretch 20 sec hold x2 reps each ?PT performed passive levator scapulae stretch 20 sec hold x2 reps each ?PT performed passive lateral translation mobs with good mobility noted and no pain x5 reps each direciton ?PT performed passive cervical rotation x2-3 reps each direction with overpressure with no discomfort; ?PT performed gentle suboccipital release 10 sec hold x2-3 reps; ? ?Upon sitting, patient reports feeling better with minimal discomfort;  ? ? ?Instructed patient in doorway stretch 20 sec hold x2 reps, added to HEP ?Instructed patient in left scalene stretch to reduce upper  trap/lateral shoulder tightness, 15 sec hold with moderate discomfort felt, requiring mod VCs for proper positioning; ? ?Patient tolerated session well. She reports significant reduction in soreness and tightness; ? ? ? ? ? ? ? ? ? ? ? ? ? ? ? ? ? ? ? ? ? ? PT Education - 12/31/21 1017   ? ? Education Details manual therapy/HEP   ? Person(s) Educated Patient   ? Methods Explanation;Verbal cues   ? Comprehension Verbalized understanding;Returned demonstration;Verbal cues required;Need further instruction   ? ?  ?  ? ?  ? ? ? PT Short Term Goals - 12/25/21 1210   ? ?  ? PT SHORT TERM GOAL #1  ? Title Pt will be independent with HEP in order to improve strength and decrease back pain in order to improve pain-free function at home and work.   ? Baseline 4/20: HEP given   ? Time 4   ? Period Weeks   ? Status New   ? Target Date 01/22/22   ? ?  ?  ? ?  ? ? ? ? PT Long Term Goals - 12/25/21 1211   ? ?  ? PT LONG TERM GOAL #1  ? Title Patient will increase FOTO score to equal to or greater than 73%  to demonstrate statistically significant improvement in mobility and quality of life.   ? Baseline 4/20: 63%   ? Time 8   ? Period Weeks   ? Status New   ? Target Date 02/19/22   ?  ? PT LONG TERM GOAL #2  ? Title Pt will demonstrate decrease in NDI to 10% in order to demonstrate clinically significant reduction in disability related to neck injury/pain   ? Baseline 4/20: 26%   ? Time 8   ? Period Weeks   ? Status New   ? Target Date 02/19/22   ?  ? PT LONG TERM GOAL #3  ? Title Patient will report a worst pain of 3/10 on VAS in cervical spine  to improve tolerance with ADLs and reduced symptoms with activities.   ? Baseline 4/20: 11/10   ? Time 8   ? Period Weeks   ? Status New   ? Target Date 02/19/22   ?  ? PT LONG TERM GOAL #4  ? Title Patient will increase cervical ROM by 10 degrees in all direction to improve funcitonal range of motion.   ? Baseline 4/20: flexion 45 extension 30; SB R 25 L 30, cervical rotation R 39, L 40    ? Time 8   ? Period Weeks   ? Status New   ? Target Date 02/19/22   ? ?  ?  ? ?  ? ? ? ? ? ? ? ?  Plan - 12/31/21 1017   ? ? Clinical Impression Statement Patient motivated and participated well within session. She exhibits increased trigger points in BUE cervical paraspinals particularly along levator scapulae and upper trap. PT instructed patient in cervical stretches and ROM exercise. She does require min VCs for proper exercise technique. PT also performed soft tissue massage including ischemic trigger point release with good tolerance. She rpeorts mild tenderness at end of session but reports overall reduction in pain and less soreness. She was instructed in doorway stretch as part of HEP. Patient would benefit from additional skilled PT Intervention to improve postural control, ROM and reduce tightness in cervical paraspinals. consider trigger point dry needling to address multiple trigger points.   ? Personal Factors and Comorbidities Age;Comorbidity 3+;Past/Current Experience;Time since onset of injury/illness/exacerbation   ? Comorbidities acute myeloblastic leukemia, GERD, heart murmur, osteopenia, thrush, burn, osteopenia, CKD stage II, and chronic nausea   ? Examination-Activity Limitations Bed Mobility;Bend;Carry;Dressing;Sit;Reach Overhead;Locomotion Level;Stand;Toileting;Transfers   ? Examination-Participation Restrictions Church;Cleaning;Community Activity;Meal Prep;Medication Management;Laundry;School;Shop;Volunteer;[place] Work   ? Stability/Clinical Decision Making Stable/Uncomplicated   ? Rehab Potential Fair   ? PT Frequency 2x / week   ? PT Duration 8 weeks   ? PT Treatment/Interventions ADLs/Self Care Home Management;Biofeedback;Canalith Repostioning;Cryotherapy;Electrical Stimulation;Iontophoresis '4mg'$ /ml Dexamethasone;Moist Heat;Traction;Ultrasound;DME Instruction;Functional mobility training;Therapeutic activities;Therapeutic exercise;Neuromuscular re-education;Manual techniques;Patient/family  education;Passive range of motion;Dry needling;Energy conservation;Splinting;Taping;Vestibular   ? PT Next Visit Plan cervical stretching ROM increase.   ? PT Home Exercise Plan see above   ? Consult

## 2021-12-30 NOTE — Patient Instructions (Signed)
Access Code: [email or web address] ?URL: [email or web address] ?Date: 12/30/2021 ?Prepared by: Blanche East ? ?Exercises ?- Doorway Pec Stretch at 60 Elevation  - 1 x daily - 7 x weekly - 1 sets - 2-3 reps - 20 sec hold ?

## 2021-12-31 ENCOUNTER — Ambulatory Visit: Payer: Medicare HMO

## 2021-12-31 ENCOUNTER — Ambulatory Visit: Payer: Medicare HMO | Admitting: Dermatology

## 2022-01-02 ENCOUNTER — Ambulatory Visit: Payer: Medicare HMO

## 2022-01-05 ENCOUNTER — Ambulatory Visit: Payer: Medicare HMO | Attending: Pain Medicine

## 2022-01-05 DIAGNOSIS — M542 Cervicalgia: Secondary | ICD-10-CM | POA: Diagnosis present

## 2022-01-05 DIAGNOSIS — R293 Abnormal posture: Secondary | ICD-10-CM | POA: Insufficient documentation

## 2022-01-05 NOTE — Therapy (Signed)
[facility] ?[place], [place], [address] ?Phone: [phone]   Fax:  [phone] ? ?Physical Therapy Treatment ? ?Patient Details  ?Name: Roberta Curtis ?MRN: [id number] ?Date of Birth: November 14, 1940 ?Referring Provider (PT): Micael Hampshire, Deitra L ? ? ?Encounter Date: 01/05/2022 ? ? PT End of Session - 01/05/22 0836   ? ? Visit Number 3   ? Number of Visits 16   ? Date for PT Re-Evaluation 02/19/22   ? Authorization Type 3/10 eval 4/20   ? PT Start Time 0802   ? PT Stop Time 0844   ? PT Time Calculation (min) 42 min   ? Activity Tolerance Patient tolerated treatment well   ? Behavior During Therapy [facility] for tasks assessed/performed   ? ?  ?  ? ?  ? ? ?Past Medical History:  ?Diagnosis Date  ? 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 GERD (gastroesophageal reflux disease)   ? Heart murmur   ? Osteopenia   ? Thrush   ? ? ?Past Surgical History:  ?Procedure Laterality Date  ? BONE MARROW TRANSPLANT    ? BREAST LUMPECTOMY Left   ? fatty tumor,1970's  ? CATARACT EXTRACTION    ? CESAREAN SECTION    ? HYSTEROTOMY    ? LYMPH GLAND EXCISION    ? MENISCUS REPAIR Right   ? NECK SURGERY    ? SALPINGECTOMY    ? WISDOM TOOTH EXTRACTION    ? ? ?There were no vitals filed for this visit. ? ? Subjective Assessment - 01/05/22 0835   ? ? Subjective Patient reports compliance with HEP. Feels like she is getting better.   ? Pertinent History Patient is referred to PT for cervicalgia. Referral includes orders for E stim and manual therapy.  PMH acute myeloblastic leukemia, GERD, heart murmur, osteopenia, thrush, burn, osteopenia, CKD stage II, and chronic nausea. 15 years ago patient fell and now has a titanium plate in her neck.  Patient moved from [place] to [facility] in February 2021. Patient has pain from bilateral shoulders, has had two rounds of shots with second round on Tuesday the 18th. Pain has been going on for a month   ? Limitations Sitting;Standing;Walking;Writing    ? How long can you sit comfortably? neck pain discomfort   ? How long can you stand comfortably? neck pain discomfort   ? How long can you walk comfortably? neck pain discomfort   ? Diagnostic tests x ray   ? Patient Stated Goals reduce pain   ? Currently in Pain? Yes   ? Pain Score 3    ? Pain Location Shoulder   ? Pain Orientation Right;Left   ? Pain Descriptors / Indicators Aching   ? Pain Type Chronic pain   ? Pain Onset More than a month ago   ? Pain Frequency Intermittent   ? ?  ?  ? ?  ? ? ? ? ? ?Pain in bilateral shoulders 3/10.  ? ?Treatment: ?Manual:  ?Lateral side bend with overpressure at glenohumeral joint and base of occiput 3x30 seconds each direction ?Suboccipital release 2x30 seconds ?Grade II UPA and CPA of cervical spine x3 minutes ?J mobilization 3x30 seconds  ? ?TherEx ?Towel distraction with cervical extension flexion supine position 10x ?Scapular depression with cervical rotation 10x 5 second holds each direction ?seated GTB row 15x (added to HEP) ? ?Trigger Point Dry Needling (TDN), unbilled ?Education performed with patient regarding potential benefit of TDN. Reviewed precautions  and risks with patient. Reviewed special precautions/risks over lung fields which include pneumothorax. Reviewed signs and symptoms of pneumothorax and advised pt to go to ER immediately if these symptoms develop advise them of dry needling treatment. Extensive time spent with pt to ensure full understanding of TDN risks. Pt provided verbal consent to treatment. TDN performed to  with 0.3 x 30 single needle placements with local twitch response (LTR). Pistoning technique utilized. Improved pain-free motion following intervention. Muscles targeted bilateral upper traps x 12 minutes ? ? ? ? ?Pt educated throughout session about proper posture and technique with exercises. Improved exercise technique, movement at target joints, use of target muscles after min to mod verbal, visual, tactile cues. ? ? ?Patient  introduced to TDN with mild dizziness afterwards but improved release of upper traps reporting a feeling of reduced stiffness. Education of cervical lengthening and postural interventions tolerated well with HEP progression. Patient does have increasing ROM each repetition of cervical side bend indicating successful muscle tissue lengthening. Patient would benefit from additional skilled PT Intervention to improve postural control, ROM and reduce tightness in cervical paraspinals. consider trigger point dry needling to address multiple trigger points. ? ? ? ? ? ? ? ? ? ? ? ? ? ? ? ? ? PT Education - 01/05/22 0836   ? ? Education Details TDN, manual, exercise technique   ? Person(s) Educated Patient   ? Methods Explanation;Demonstration;Tactile cues;Verbal cues   ? Comprehension Verbalized understanding;Returned demonstration;Verbal cues required;Tactile cues required   ? ?  ?  ? ?  ? ? ? PT Short Term Goals - 12/25/21 1210   ? ?  ? PT SHORT TERM GOAL #1  ? Title Pt will be independent with HEP in order to improve strength and decrease back pain in order to improve pain-free function at home and work.   ? Baseline 4/20: HEP given   ? Time 4   ? Period Weeks   ? Status New   ? Target Date 01/22/22   ? ?  ?  ? ?  ? ? ? ? PT Long Term Goals - 12/25/21 1211   ? ?  ? PT LONG TERM GOAL #1  ? Title Patient will increase FOTO score to equal to or greater than 73%  to demonstrate statistically significant improvement in mobility and quality of life.   ? Baseline 4/20: 63%   ? Time 8   ? Period Weeks   ? Status New   ? Target Date 02/19/22   ?  ? PT LONG TERM GOAL #2  ? Title Pt will demonstrate decrease in NDI to 10% in order to demonstrate clinically significant reduction in disability related to neck injury/pain   ? Baseline 4/20: 26%   ? Time 8   ? Period Weeks   ? Status New   ? Target Date 02/19/22   ?  ? PT LONG TERM GOAL #3  ? Title Patient will report a worst pain of 3/10 on VAS in cervical spine  to improve tolerance  with ADLs and reduced symptoms with activities.   ? Baseline 4/20: 11/10   ? Time 8   ? Period Weeks   ? Status New   ? Target Date 02/19/22   ?  ? PT LONG TERM GOAL #4  ? Title Patient will increase cervical ROM by 10 degrees in all direction to improve funcitonal range of motion.   ? Baseline 4/20: flexion 45 extension 30; SB R 25 L 30,  cervical rotation R 39, L 40   ? Time 8   ? Period Weeks   ? Status New   ? Target Date 02/19/22   ? ?  ?  ? ?  ? ? ? ? ? ? ? ? Plan - 01/05/22 1259   ? ? Clinical Impression Statement Patient introduced to TDN with mild dizziness afterwards but improved release of upper traps reporting a feeling of reduced stiffness. Education of cervical lengthening and postural interventions tolerated well with HEP progression. Patient does have increasing ROM each repetition of cervical side bend indicating successful muscle tissue lengthening. Patient would benefit from additional skilled PT Intervention to improve postural control, ROM and reduce tightness in cervical paraspinals. consider trigger point dry needling to address multiple trigger points.   ? Personal Factors and Comorbidities Age;Comorbidity 3+;Past/Current Experience;Time since onset of injury/illness/exacerbation   ? Comorbidities acute myeloblastic leukemia, GERD, heart murmur, osteopenia, thrush, burn, osteopenia, CKD stage II, and chronic nausea   ? Examination-Activity Limitations Bed Mobility;Bend;Carry;Dressing;Sit;Reach Overhead;Locomotion Level;Stand;Toileting;Transfers   ? Examination-Participation Restrictions Church;Cleaning;Community Activity;Meal Prep;Medication Management;Laundry;School;Shop;Volunteer;[employer] Work   ? Stability/Clinical Decision Making Stable/Uncomplicated   ? Rehab Potential Fair   ? PT Frequency 2x / week   ? PT Duration 8 weeks   ? PT Treatment/Interventions ADLs/Self Care Home Management;Biofeedback;Canalith Repostioning;Cryotherapy;Electrical Stimulation;Iontophoresis '4mg'$ /ml Dexamethasone;Moist  Heat;Traction;Ultrasound;DME Instruction;Functional mobility training;Therapeutic activities;Therapeutic exercise;Neuromuscular re-education;Manual techniques;Patient/family education;Passive range of moti

## 2022-01-06 ENCOUNTER — Ambulatory Visit: Payer: Medicare HMO

## 2022-01-06 DIAGNOSIS — M542 Cervicalgia: Secondary | ICD-10-CM

## 2022-01-06 DIAGNOSIS — R293 Abnormal posture: Secondary | ICD-10-CM

## 2022-01-06 NOTE — Therapy (Signed)
[place] ?[place], [place], [address] ?Phone: [phone]   Fax:  [phone] ? ?Physical Therapy Treatment ? ?Patient Details  ?Name: Desiree Daise Goldinger ?MRN: [id number] ?Date of Birth: [birth year] ?Referring Provider (PT): Micael Hampshire, Deitra L ? ? ?Encounter Date: 01/06/2022 ? ? PT End of Session - 01/06/22 1428   ? ? Visit Number 4   ? Number of Visits 16   ? Date for PT Re-Evaluation 02/19/22   ? Authorization Type 4/10 eval 4/20   ? PT Start Time 1430   ? PT Stop Time 0347   ? PT Time Calculation (min) 44 min   ? Activity Tolerance Patient tolerated treatment well   ? Behavior During Therapy [facility] for tasks assessed/performed   ? ?  ?  ? ?  ? ? ?Past Medical History:  ?Diagnosis Date  ? 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 GERD (gastroesophageal reflux disease)   ? Heart murmur   ? Osteopenia   ? Thrush   ? ? ?Past Surgical History:  ?Procedure Laterality Date  ? BONE MARROW TRANSPLANT    ? BREAST LUMPECTOMY Left   ? fatty tumor,1970's  ? CATARACT EXTRACTION    ? CESAREAN SECTION    ? HYSTEROTOMY    ? LYMPH GLAND EXCISION    ? MENISCUS REPAIR Right   ? NECK SURGERY    ? SALPINGECTOMY    ? WISDOM TOOTH EXTRACTION    ? ? ?There were no vitals filed for this visit. ? ? Subjective Assessment - 01/06/22 1432   ? ? Subjective Patient reports her neck is feeling better after the dry needling. Did her rows today.   ? Pertinent History Patient is referred to PT for cervicalgia. Referral includes orders for E stim and manual therapy.  PMH acute myeloblastic leukemia, GERD, heart murmur, osteopenia, thrush, burn, osteopenia, CKD stage II, and chronic nausea. 15 years ago patient fell and now has a titanium plate in her neck.  Patient moved from [place] to [facility] in February 2021. Patient has pain from bilateral shoulders, has had two rounds of shots with second round on Tuesday the 18th. Pain has been going on for a month   ? Limitations  Sitting;Standing;Walking;Writing   ? How long can you sit comfortably? neck pain discomfort   ? How long can you stand comfortably? neck pain discomfort   ? How long can you walk comfortably? neck pain discomfort   ? Diagnostic tests x ray   ? Patient Stated Goals reduce pain   ? Currently in Pain? Yes   ? Pain Score 2    ? Pain Location Shoulder   ? Pain Orientation Right;Left   ? Pain Descriptors / Indicators Aching   ? Pain Type Chronic pain   ? Pain Onset More than a month ago   ? Pain Frequency Intermittent   ? ?  ?  ? ?  ? ? ?Patient reports her neck is feeling better after the dry needling.  ? ? ?Treatment: ?Manual:  ?Lateral side bend with overpressure at glenohumeral joint and base of occiput 3x30 seconds each direction ?Suboccipital release 3x30 seconds ?Grade II UPA and CPA of cervical spine x3 minutes ?J mobilization 3x30 seconds  ?  ?TherEx ?Supine ?Towel distraction with cervical extension flexion supine position 10x ?Scapular depression with cervical rotation 10x 5 second holds each direction ?RTB supine overhead Y 15x ?RTB supine ER 15x  ? ?Wall posture:  ?-wall  posture 10x 3 second holds ?-shoulder abduction 10x; very challenging for patient to perform ? ?Seated:  ?seated GTB row 15x  ?Trunk extension 10x over towel roll  ?GTB straight arm lat pulldown 15x ?Arnold to french stretch 10x  ?Upper trap stretch 30 seconds each side ?Large shoulder rolls 10x ? ? ?Pt educated throughout session about proper posture and technique with exercises. Improved exercise technique, movement at target joints, use of target muscles after min to mod verbal, visual, tactile cues. ? ?Patient had positive response to dry needling from previous session indicating potential use in future sessions. Was not performed today due to sessions being back to back. Patient would benefit from additional skilled PT Intervention to improve postural control, ROM and reduce tightness in cervical paraspinals. consider trigger point dry  needling to address multiple trigger points ? ? ? ? ? ? ? ? ? ? ? ? ? ? ? ? ? ? ? PT Education - 01/06/22 1412   ? ? Education Details exercise technique, manual, TDN   ? Person(s) Educated Patient   ? Methods Explanation;Demonstration;Tactile cues;Verbal cues   ? Comprehension Verbalized understanding;Returned demonstration;Verbal cues required;Tactile cues required   ? ?  ?  ? ?  ? ? ? PT Short Term Goals - 12/25/21 1210   ? ?  ? PT SHORT TERM GOAL #1  ? Title Pt will be independent with HEP in order to improve strength and decrease back pain in order to improve pain-free function at home and work.   ? Baseline 4/20: HEP given   ? Time 4   ? Period Weeks   ? Status New   ? Target Date 01/22/22   ? ?  ?  ? ?  ? ? ? ? PT Long Term Goals - 12/25/21 1211   ? ?  ? PT LONG TERM GOAL #1  ? Title Patient will increase FOTO score to equal to or greater than 73%  to demonstrate statistically significant improvement in mobility and quality of life.   ? Baseline 4/20: 63%   ? Time 8   ? Period Weeks   ? Status New   ? Target Date 02/19/22   ?  ? PT LONG TERM GOAL #2  ? Title Pt will demonstrate decrease in NDI to 10% in order to demonstrate clinically significant reduction in disability related to neck injury/pain   ? Baseline 4/20: 26%   ? Time 8   ? Period Weeks   ? Status New   ? Target Date 02/19/22   ?  ? PT LONG TERM GOAL #3  ? Title Patient will report a worst pain of 3/10 on VAS in cervical spine  to improve tolerance with ADLs and reduced symptoms with activities.   ? Baseline 4/20: 11/10   ? Time 8   ? Period Weeks   ? Status New   ? Target Date 02/19/22   ?  ? PT LONG TERM GOAL #4  ? Title Patient will increase cervical ROM by 10 degrees in all direction to improve funcitonal range of motion.   ? Baseline 4/20: flexion 45 extension 30; SB R 25 L 30, cervical rotation R 39, L 40   ? Time 8   ? Period Weeks   ? Status New   ? Target Date 02/19/22   ? ?  ?  ? ?  ? ? ? ? ? ? ? ? Plan - 01/06/22 1442   ? ? Clinical  Impression Statement Patient  had positive response to dry needling from previous session indicating potential use in future sessions. Was not performed today due to sessions being back to back. Patient would benefit from additional skilled PT Intervention to improve postural control, ROM and reduce tightness in cervical paraspinals. consider trigger point dry needling to address multiple trigger points   ? Personal Factors and Comorbidities Age;Comorbidity 3+;Past/Current Experience;Time since onset of injury/illness/exacerbation   ? Comorbidities acute myeloblastic leukemia, GERD, heart murmur, osteopenia, thrush, burn, osteopenia, CKD stage II, and chronic nausea   ? Examination-Activity Limitations Bed Mobility;Bend;Carry;Dressing;Sit;Reach Overhead;Locomotion Level;Stand;Toileting;Transfers   ? Examination-Participation Restrictions Church;Cleaning;Community Activity;Meal Prep;Medication Management;Laundry;School;Shop;Volunteer;[employer] Work   ? Stability/Clinical Decision Making Stable/Uncomplicated   ? Rehab Potential Fair   ? PT Frequency 2x / week   ? PT Duration 8 weeks   ? PT Treatment/Interventions ADLs/Self Care Home Management;Biofeedback;Canalith Repostioning;Cryotherapy;Electrical Stimulation;Iontophoresis '4mg'$ /ml Dexamethasone;Moist Heat;Traction;Ultrasound;DME Instruction;Functional mobility training;Therapeutic activities;Therapeutic exercise;Neuromuscular re-education;Manual techniques;Patient/family education;Passive range of motion;Dry needling;Energy conservation;Splinting;Taping;Vestibular   ? PT Next Visit Plan cervical stretching ROM increase.   ? PT Home Exercise Plan see above   ? Consulted and Agree with Plan of Care Patient   ? ?  ?  ? ?  ? ? ?Patient will benefit from skilled therapeutic intervention in order to improve the following deficits and impairments:  Decreased coordination, Decreased endurance, Decreased range of motion, Decreased mobility, Decreased strength, Hypomobility,  Dizziness, Increased muscle spasms, Impaired perceived functional ability, Impaired flexibility, Improper body mechanics, Postural dysfunction, Pain ? ?Visit Diagnosis: ?Cervicalgia ? ?Abnormal posture ? ? ? ? ?Problem List ?Fraser Din

## 2022-01-08 ENCOUNTER — Encounter: Payer: Self-pay | Admitting: Family

## 2022-01-08 ENCOUNTER — Telehealth: Payer: Self-pay

## 2022-01-08 ENCOUNTER — Ambulatory Visit (INDEPENDENT_AMBULATORY_CARE_PROVIDER_SITE_OTHER): Payer: Medicare HMO | Admitting: Family

## 2022-01-08 VITALS — BP 124/66 | HR 78 | Temp 98.1°F | Resp 16 | Ht 61.0 in | Wt 137.0 lb

## 2022-01-08 DIAGNOSIS — J90 Pleural effusion, not elsewhere classified: Secondary | ICD-10-CM | POA: Diagnosis not present

## 2022-01-08 DIAGNOSIS — R42 Dizziness and giddiness: Secondary | ICD-10-CM | POA: Diagnosis not present

## 2022-01-08 DIAGNOSIS — J189 Pneumonia, unspecified organism: Secondary | ICD-10-CM | POA: Insufficient documentation

## 2022-01-08 DIAGNOSIS — R32 Unspecified urinary incontinence: Secondary | ICD-10-CM | POA: Insufficient documentation

## 2022-01-08 MED ORDER — MIRABEGRON ER 50 MG PO TB24
50.0000 mg | ORAL_TABLET | Freq: Every day | ORAL | Status: DC
Start: 2022-01-08 — End: 2022-02-14

## 2022-01-08 MED ORDER — DOXYCYCLINE HYCLATE 100 MG PO TABS: 100.0000 mg | ORAL_TABLET | Freq: Two times a day (BID) | ORAL | 0 refills | Status: AC

## 2022-01-08 MED ORDER — MECLIZINE HCL 12.5 MG PO TABS: ORAL_TABLET | ORAL | 0 refills | Status: DC

## 2022-01-08 NOTE — Assessment & Plan Note (Signed)
Two small noted on prior CT.  ?No pedal edema. ?No worsening sob or doe.  ?pcp to continue to monitor. ?

## 2022-01-08 NOTE — Assessment & Plan Note (Addendum)
Meclizine 12.5 mg as needed ?Rise slowly upon standing or sitting up  ?Handout given on vertigo to pt and sent via mychart ? ?Total time in office reviewing CT findings, address acute chronic concerns as well as performing orthostatic v/s took 32 minutes. ? ?

## 2022-01-08 NOTE — Progress Notes (Signed)
? ?Established Patient Office Visit ? ?Subjective:  ?Patient ID: Roberta Curtis, female    DOB: 1941/03/01  Age: 81 y.o. MRN: [id number] ? ?CC:  ?Chief Complaint  ?Patient presents with  ? Dizziness  ? Ear Fullness  ? ? ?HPI ?Roberta Curtis is here today with concerns.  ? ?Pt states feels dizzy here and there. Two days ago when she went to get up from physical therapy exercise she went to go stand up and felt very lightheaded and dizzy, didn't pass out, but did get woozy.  ? ?She c/o bil ear fullness, does wear hearing aids. Did notice buzzing sound left ear a few days ago as well. Was not wearing a hearing aid. If she sticks her finger in her left ear she feels the buzzing sound louder. If she bends over to put on her shoes or anything the room starts spinning.  ? ?She does feel some fullness on the left side of her sinuses  ? ?Tuesday night did have diarrhea which she thought was from hot dogs but not since.  ? ?Reviewed ekg 4/17 NSR ? ?Past Medical History:  ?Diagnosis Date  ? 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 GERD (gastroesophageal reflux disease)   ? Heart murmur   ? Osteopenia   ? Thrush   ? ? ?Past Surgical History:  ?Procedure Laterality Date  ? BONE MARROW TRANSPLANT    ? BREAST LUMPECTOMY Left   ? fatty tumor,1970's  ? CATARACT EXTRACTION    ? CESAREAN SECTION    ? HYSTEROTOMY    ? LYMPH GLAND EXCISION    ? MENISCUS REPAIR Right   ? NECK SURGERY    ? SALPINGECTOMY    ? WISDOM TOOTH EXTRACTION    ? ? ?Family History  ?Problem Relation Age of Onset  ? Retinitis pigmentosa Mother   ? Stroke Mother   ? Liver cancer Father   ? Heart disease Father   ? Heart disease Brother   ? Blindness Brother   ? ? ?Social History  ? ?Socioeconomic History  ? Marital status: Widowed  ?  Spouse name: Alvis Lemmings  ? Number of children: 3  ? Years of education: high school  ? Highest education level: Not on file  ?Occupational History  ? Not on file  ?Tobacco Use  ? Smoking status: Former  ?  Packs/day: 1.00  ?   Years: 6.00  ?  Pack years: 6.00  ?  Types: Cigarettes  ?  Quit date: 02/20/1970  ?  Years since quitting: 51.9  ? Smokeless tobacco: Never  ?Vaping Use  ? Vaping Use: Never used  ?Substance and Sexual Activity  ? Alcohol use: Not Currently  ? Drug use: Never  ? Sexual activity: Not Currently  ?  Partners: Male  ?  Birth control/protection: Post-menopausal  ?Other Topics Concern  ? Not on file  ?Social History Narrative  ?   ? Right handed  ? Lives in a [place] with husband  ? From [place]  ?   ? 02/12/20  ? From: [place], [place] near [place]  ? Living: with Alvis Lemmings - husband  ? Health Care Proxy: Teresita Madura (daughter)  ? 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   ? Family: Benjamine Mola Merchant navy officer, lives in [place]), [place] ([place], [place]), [place] ([place])  ?   ? Enjoys: meet new people, socialable  ?   ? Exercise: not currently - cleaning  ? Diet: picky eater -  eats veggies - but her husband doesn't like these things  ?   ? Safety  ? Seat belts: Yes   ? Guns: Yes  and secure  ? Safe in relationships: Yes   ? ?Social Determinants of Health  ? ?Financial Resource Strain: Low Risk   ? Difficulty of Paying Living Expenses: Not hard at all  ?Food Insecurity: No Food Insecurity  ? Worried About Charity fundraiser in the Last Year: Never true  ? Ran Out of Food in the Last Year: Never true  ?Transportation Needs: No Transportation Needs  ? Lack of Transportation (Medical): No  ? Lack of Transportation (Non-Medical): No  ?Physical Activity: Inactive  ? Days of Exercise per Week: 0 days  ? Minutes of Exercise per Session: 0 min  ?Stress: No Stress Concern Present  ? Feeling of Stress : Not at all  ?Social Connections: Not on file  ?Intimate Partner Violence: Not At Risk  ? Fear of Current or Ex-Partner: No  ? Emotionally Abused: No  ? Physically Abused: No  ? Sexually Abused: No  ? ? ?Outpatient Medications Prior to Visit  ?Medication Sig Dispense Refill  ? albuterol (VENTOLIN HFA) 108 (90  Base) MCG/ACT inhaler Inhale 1-2 puffs into the lungs as needed for wheezing or shortness of breath. 6.7 g 1  ? atorvastatin (LIPITOR) 10 MG tablet Take 1 tablet (10 mg total) by mouth daily. 90 tablet 3  ? BIOTIN PO Take 1,000 mcg by mouth daily.     ? calcium carbonate (OS-CAL - DOSED IN MG OF ELEMENTAL CALCIUM) 1250 (500 Ca) MG tablet Take 1 tablet by mouth.    ? esomeprazole (NEXIUM) 20 MG capsule TAKE 1 CAPSULE (20 MG TOTAL) BY MOUTH DAILY AT 12 NOON. 90 capsule 1  ? hydrocortisone 2.5 % cream Apply twice daily, mixed with ketoconazole for 1-2 weeks 60 g 2  ? ibandronate (BONIVA) 150 MG tablet TAKE 1 TABLET (150 MG TOTAL) BY MOUTH EVERY 30 (THIRTY) DAYS. 3 tablet 1  ? ketoconazole (NIZORAL) 2 % cream Apply to breast folds twice daily, mixed with hydrocortisone cream,  for 1-2 weeks, decrease to once daily as directed 60 g 2  ? lidocaine (LIDODERM) 5 % Place 1 patch onto the skin daily. Remove & Discard patch within 12 hours or as directed by MD 10 patch 0  ? methocarbamol (ROBAXIN) 500 MG tablet Take by mouth.    ? Naproxen Sodium (ALEVE) 220 MG CAPS Take by mouth.    ? POTASSIUM PO Take 99 mg by mouth daily.     ? prochlorperazine (COMPAZINE) 10 MG tablet Take 1 tablet (10 mg total) by mouth daily. 90 tablet 1  ? VITAMIN D PO Take 50,000 mcg by mouth daily.     ? Vibegron (GEMTESA) 75 MG TABS Take 75 mg by mouth daily. 30 tablet 11  ? ?No facility-administered medications prior to visit.  ? ? ?Allergies  ?Allergen Reactions  ? Jadenu [Deferasirox] Other (See Comments)  ?  Elevated Iron, headache and rash  ? Levofloxacin Other (See Comments)  ? Ondansetron Hcl Other (See Comments)  ? Codeine Palpitations  ? Oxycodone-Acetaminophen Palpitations  ? Penicillins Rash  ? ? ?ROS ?Review of Systems  ?Constitutional:  Negative for chills and fever.  ?HENT:  Positive for ear pain (bil ear fullness) and tinnitus (left buzzing in ear). Negative for congestion, sinus pressure and sore throat.   ?Respiratory:  Negative for  cough, shortness of breath and wheezing.   ?Cardiovascular:  Negative for chest pain and palpitations.  ?Neurological:  Positive for dizziness (with bending over or sitting up) and light-headedness (at times).  ? ?  ?Objective:  ?  ?Orthostatic VS for the past 24 hrs: ? BP- Lying Pulse- Lying BP- Sitting Pulse- Sitting BP- Standing at 0 minutes Pulse- Standing at 0 minutes  ?01/08/22 1106 136/76 86 161/83 82 168/81 84  ? ? ? ?Physical Exam ?Constitutional:   ?   General: She is not in acute distress. ?   Appearance: Normal appearance. She is normal weight. She is not ill-appearing, toxic-appearing or diaphoretic.  ?HENT:  ?   Head: Normocephalic.  ?   Right Ear: Tympanic membrane normal.  ?   Left Ear: Tympanic membrane normal.  ?   Nose: Nose normal.  ?   Mouth/Throat:  ?   Mouth: Mucous membranes are moist.  ?Eyes:  ?   Pupils: Pupils are equal, round, and reactive to light.  ?Cardiovascular:  ?   Rate and Rhythm: Normal rate.  ?Pulmonary:  ?   Effort: Pulmonary effort is normal.  ?Musculoskeletal:  ?   Cervical back: Normal range of motion.  ?Skin: ?   General: Skin is warm.  ?Neurological:  ?   General: No focal deficit present.  ?   Mental Status: She is alert and oriented to person, place, and time.  ?   Cranial Nerves: Cranial nerves 2-12 are intact. No facial asymmetry.  ?   Coordination: Romberg sign negative.  ?   Gait: Gait is intact.  ?   Comments: Negative dix hallpyke  ?Negative jolt attentuation  ?Psychiatric:     ?   Mood and Affect: Mood normal.     ?   Behavior: Behavior normal.     ?   Thought Content: Thought content normal.     ?   Judgment: Judgment normal.  ? ? ?BP 124/66   Pulse 78   Temp 98.1 ?F (36.7 ?C)   Resp 16   Ht '5\' 1"'$  (1.549 m)   Wt 137 lb (62.1 kg)   SpO2 99%   BMI 25.89 kg/m?  ?Wt Readings from Last 3 Encounters:  ?01/08/22 137 lb (62.1 kg)  ?12/29/21 137 lb (62.1 kg)  ?12/22/21 136 lb 2 oz (61.7 kg)  ? ? ? ?Health Maintenance Due  ?Topic Date Due  ? COVID-19 Vaccine (5 -  Booster for Pfizer series) 04/30/2021  ? ? ?There are no preventive care reminders to display for this patient. ? ?No results found for: TSH ?Lab Results  ?Component Value Date  ? WBC 8.1 12/20/2021  ? HGB 11.4 (L)

## 2022-01-08 NOTE — Assessment & Plan Note (Addendum)
Suspected vertigo, likely from sinus congestion. ? ?Did advise pt if any s/s stroke (drooping of face, weakness one size, slurry speech, go to er immediately) ?Increase water intake.  ?

## 2022-01-08 NOTE — Assessment & Plan Note (Signed)
Recent visit with urology changed her medication to myrbetriq.  ?Added to list. ?

## 2022-01-08 NOTE — Telephone Encounter (Signed)
Agree with precautions given to pt  Agree with nurse assessment in plan.  Thank you for speaking with them. 

## 2022-01-08 NOTE — Assessment & Plan Note (Signed)
Reviewed most recent 4/17 CT with suggestion of viral pneumonia. Will treat as such,  ?rx doxycycyline 100 mg po bidx 10 days. Have pt to f/u with pcp in 7-10 days, consider repeat cxr for resolution ? ?

## 2022-01-08 NOTE — Telephone Encounter (Signed)
I spoke with pt; pt said starting on 01/07/22 had dizziness where room spun around; lt ear feels full or pressure feeling but no pain. Pt wears a hearing aid but forgot to put in on 01/07/22 but had a buzzing sound in lt ear. No pain and no H/A. Dizziness comes and goes. Pt scheduled appt at [facility] and husband will drive pt to see T Dugal FNP 01/08/22 at 10:40. UC & ED precautions given and pt voiced understanding. Sending note to Red Christians FNP. ?

## 2022-01-08 NOTE — Patient Instructions (Signed)
Antibiotic sent to preferred pharmacy.  ?Going to treat for recent CT suggestion of pneumonia.  ? ?Please increase oral fluids, steamy hot shower/humidifier prn. ? ?Please follow up if no improvement in 2-3 days.  ? ?It was a pleasure seeing you today! Please do not hesitate to reach out with any questions and or concerns. ? ?Regards,  ? ?Charmel Pronovost ? ? ?

## 2022-01-08 NOTE — Telephone Encounter (Signed)
[name] - Client ?Nonclinical Telephone Record  ?[facility]? ?Client [name] - Client ?Client Site [name] ?Provider Waunita Schooner- MD ?Contact Type Call ?Who Is Calling Patient / Member / Family / Caregiver ?Caller Name Miguel Barrera ?Caller Phone Number [phone] ?Patient Name Roberta Curtis ?Patient DOB 07/05/2041 ?Call Type Message Only Information Provided ?Reason for Call Request for General Office Information ?Initial Comment Caller states she wears a hearing aid in her left ear. For the last 2 days she gets dizzy when she ?moves her head and her ear feels stopped and she hears a buzzing in there. Requesting referral ?for ENT. ?Disp. Time Disposition Final User ?01/07/2022 7:18:04 PM General Information Provided Yes Kathlynn Grate ?Call Closed By: Kathlynn Grate ?Transaction Date/Time: 01/07/2022 7:14:22 PM (ET ?

## 2022-01-13 ENCOUNTER — Ambulatory Visit: Payer: Medicare HMO

## 2022-01-15 ENCOUNTER — Ambulatory Visit: Payer: Medicare HMO

## 2022-01-19 ENCOUNTER — Ambulatory Visit (INDEPENDENT_AMBULATORY_CARE_PROVIDER_SITE_OTHER): Payer: Medicare HMO | Admitting: Family Medicine

## 2022-01-19 VITALS — BP 138/70 | HR 72 | Temp 98.0°F | Ht 61.0 in | Wt 137.5 lb

## 2022-01-19 DIAGNOSIS — H6692 Otitis media, unspecified, left ear: Secondary | ICD-10-CM

## 2022-01-19 DIAGNOSIS — R42 Dizziness and giddiness: Secondary | ICD-10-CM

## 2022-01-19 MED ORDER — MECLIZINE HCL 12.5 MG PO TABS: 12.5000 mg | ORAL_TABLET | Freq: Two times a day (BID) | ORAL | 0 refills | Status: DC

## 2022-01-19 MED ORDER — MECLIZINE HCL 12.5 MG PO TABS: ORAL_TABLET | ORAL | 0 refills | Status: DC

## 2022-01-19 MED ORDER — CEFDINIR 300 MG PO CAPS: 600.0000 mg | ORAL_CAPSULE | Freq: Every day | ORAL | 0 refills | Status: AC

## 2022-01-19 NOTE — Patient Instructions (Addendum)
Ear infection  ?- take the antibiotic ?- refill meclizine ? ?Referral to ENT and physical therapy ? ?Vestibular physical therapy  ?

## 2022-01-19 NOTE — Assessment & Plan Note (Signed)
Partial response to meclizine. Refill provided. Will treat possible otitis media w/ abx. However given hearing loss, left sided facial symptoms - will refer to ENT if symptoms do not resolve. Will also have her connect with PT for evaluation. If persisting and unable to see ENT, steroid course may be reasonable. ?

## 2022-01-19 NOTE — Progress Notes (Signed)
? ?Subjective:  ? ?  ?Roberta Curtis is a 81 y.o. female presenting for Follow-up (vertigo) and Numbness (In L side of face ) ?  ? ? ?HPI ? ?#Dizziness ?- still feels that she is in motion ?- feels like the whole left side the face is clogged ?- puffy and numbness on the left side since her last visit ?- took the doxycycline - completed ?- breathing fine - no cough, sob ?- taking meclizine - 2 times a day and it helps but takes 30-60 minutes to work ? ?Review of Systems ? ?01/08/2022: [facility] with pleural effusion and possible PNA - doxycycline, meclizine for vertigo ? ?Social History  ? ?Tobacco Use  ?Smoking Status Former  ? Packs/day: 1.00  ? Years: 6.00  ? Pack years: 6.00  ? Types: Cigarettes  ? Quit date: 02/20/1970  ? Years since quitting: 51.9  ?Smokeless Tobacco Never  ? ? ? ?   ?Objective:  ?  ?BP Readings from Last 3 Encounters:  ?01/19/22 138/70  ?01/08/22 124/66  ?12/29/21 (!) 165/76  ? ?Wt Readings from Last 3 Encounters:  ?01/19/22 137 lb 8 oz (62.4 kg)  ?01/08/22 137 lb (62.1 kg)  ?12/29/21 137 lb (62.1 kg)  ? ? ?BP 138/70   Pulse 72   Temp 98 ?F (36.7 ?C) (Temporal)   Ht '5\' 1"'$  (1.549 m)   Wt 137 lb 8 oz (62.4 kg)   SpO2 99%   BMI 25.98 kg/m?  ? ? ?Physical Exam ?Constitutional:   ?   General: She is not in acute distress. ?   Appearance: She is well-developed. She is not diaphoretic.  ?HENT:  ?   Right Ear: Tympanic membrane and external ear normal.  ?   Left Ear: External ear normal. Decreased hearing noted.  ?   Ears:  ?   Comments: Dull, grey TM on the left side ?   Nose: Nose normal.  ?Eyes:  ?   Conjunctiva/sclera: Conjunctivae normal.  ?Cardiovascular:  ?   Rate and Rhythm: Normal rate and regular rhythm.  ?Pulmonary:  ?   Effort: Pulmonary effort is normal. No respiratory distress.  ?   Breath sounds: Normal breath sounds. No wheezing.  ?Musculoskeletal:  ?   Cervical back: Neck supple.  ?Skin: ?   General: Skin is warm and dry.  ?   Capillary Refill: Capillary refill takes less  than 2 seconds.  ?Neurological:  ?   Mental Status: She is alert. Mental status is at baseline.  ?   Cranial Nerves: Cranial nerves 2-12 are intact.  ?   Sensory: Sensation is intact.  ?   Motor: Motor function is intact.  ?   Coordination: Coordination normal. Finger-Nose-Finger Test normal.  ?   Gait: Gait is intact.  ?   Comments: She has chronic facial asymmetry with the lower lip on the left side, which is stable per patient.   ?Psychiatric:     ?   Mood and Affect: Mood normal.     ?   Behavior: Behavior normal.  ? ? ? ? ? ?   ?Assessment & Plan:  ? ?Problem List Items Addressed This Visit   ? ?  ? Nervous and Auditory  ? Acute left otitis media - Primary  ?  Completed course of doxycycline w/ persistent symptom. Unclear if dull ear drum from prior treatment but given vertigo reasonable to do another trail of abx for possible otitis media. 10 days of cefdinir (pt has  tolerated keflex).  ? ?  ?  ? Relevant Medications  ? cefdinir (OMNICEF) 300 MG capsule  ?  ? Other  ? Vertigo  ?  Partial response to meclizine. Refill provided. Will treat possible otitis media w/ abx. However given hearing loss, left sided facial symptoms - will refer to ENT if symptoms do not resolve. Will also have her connect with PT for evaluation. If persisting and unable to see ENT, steroid course may be reasonable. ? ?  ?  ? Relevant Medications  ? meclizine (ANTIVERT) 12.5 MG tablet  ? Other Relevant Orders  ? Ambulatory referral to ENT  ? Ambulatory referral to Physical Therapy  ? ? ? ?Return if symptoms worsen or fail to improve. ? ?Lesleigh Noe, MD ? ? ? ?

## 2022-01-19 NOTE — Assessment & Plan Note (Signed)
Completed course of doxycycline w/ persistent symptom. Unclear if dull ear drum from prior treatment but given vertigo reasonable to do another trail of abx for possible otitis media. 10 days of cefdinir (pt has tolerated keflex).  ?

## 2022-01-21 ENCOUNTER — Other Ambulatory Visit: Payer: Self-pay | Admitting: Family Medicine

## 2022-01-21 DIAGNOSIS — R42 Dizziness and giddiness: Secondary | ICD-10-CM

## 2022-01-21 NOTE — Telephone Encounter (Signed)
Is this okay to refill  ? ?Last filled  on 01/21/22 ?Last OV  01/19/22 ?Next OV 02/19/22 ?

## 2022-01-22 ENCOUNTER — Telehealth: Payer: Self-pay | Admitting: Family Medicine

## 2022-01-22 NOTE — Telephone Encounter (Signed)
Pt called stating she needs to speak with the nurse about her medications [place] [id number] [place] [address] She states "they need the dose and mg amount, has tried 3 times to go pick it up, it is not ready because of this." Please return the call when possible. Could not hear which medications they were, pt was speaking a little fast.   Callback Number: [phone]

## 2022-01-22 NOTE — Telephone Encounter (Signed)
Spoke to pharmacy and they needed clarification on the instructions for the meclizine rx. Should it be take 1 tablet BID and 1 additional at bedtime if needed?

## 2022-01-23 ENCOUNTER — Telehealth: Payer: Self-pay | Admitting: Family Medicine

## 2022-01-23 NOTE — Telephone Encounter (Signed)
Yes, I sent in an updated prescription yesterday with those instructions.   Would also be fine to change to three times daily as needed

## 2022-01-23 NOTE — Telephone Encounter (Signed)
Pt called again about her ENT doctor and physical therapy referrals. Pt was frustrated and had a loud toned voice most of the call. Pt states she is dizzy, has no headaches just dizzy, wants to know when these places will give her a call to schedule an appointment. I made her aware of the time policy for referrals but she would not listen and kept saying "I need to be seen now." Pt states these referrals were sent in on 01/19/2022. Please return the call when possible.   Callback Number: [phone]

## 2022-01-23 NOTE — Telephone Encounter (Signed)
Spoke with patient in regards to husbands AWV, and she wanted to know if there was any update on this message. I advised that I would resend message and someone clinical call her.

## 2022-01-23 NOTE — Telephone Encounter (Signed)
Routing to Ashtyn for update and to switch to urgent to be processed immediately on Monday.    Routing to MA and Freight forwarder to have someone reach out and let her know that the referral coordinator is out but will be able to follow up on Monday.  Also, if I remember correctly she is already in PT - she might be able to call the PT office to see if they've already received the referral.

## 2022-01-26 NOTE — Telephone Encounter (Signed)
Appreciate team support.

## 2022-01-30 ENCOUNTER — Other Ambulatory Visit: Payer: Self-pay | Admitting: Otolaryngology

## 2022-01-30 DIAGNOSIS — H9042 Sensorineural hearing loss, unilateral, left ear, with unrestricted hearing on the contralateral side: Secondary | ICD-10-CM

## 2022-01-30 DIAGNOSIS — R42 Dizziness and giddiness: Secondary | ICD-10-CM

## 2022-02-04 ENCOUNTER — Encounter: Payer: Medicare HMO | Admitting: Physical Therapy

## 2022-02-08 ENCOUNTER — Emergency Department: Payer: Medicare HMO

## 2022-02-08 ENCOUNTER — Observation Stay: Payer: Medicare HMO

## 2022-02-08 ENCOUNTER — Other Ambulatory Visit: Payer: Self-pay

## 2022-02-08 ENCOUNTER — Inpatient Hospital Stay
Admission: EM | Admit: 2022-02-08 | Discharge: 2022-02-14 | DRG: 069 | Disposition: A | Discharge status: Home Health | Payer: Medicare HMO | Attending: Osteopathic Medicine | Admitting: Osteopathic Medicine

## 2022-02-08 DIAGNOSIS — K5900 Constipation, unspecified: Secondary | ICD-10-CM

## 2022-02-08 DIAGNOSIS — R531 Weakness: Secondary | ICD-10-CM

## 2022-02-08 DIAGNOSIS — R2990 Unspecified symptoms and signs involving the nervous system: Secondary | ICD-10-CM | POA: Diagnosis present

## 2022-02-08 DIAGNOSIS — Z87891 Personal history of nicotine dependence: Secondary | ICD-10-CM

## 2022-02-08 DIAGNOSIS — R194 Change in bowel habit: Secondary | ICD-10-CM

## 2022-02-08 DIAGNOSIS — E538 Deficiency of other specified B group vitamins: Secondary | ICD-10-CM

## 2022-02-08 DIAGNOSIS — G459 Transient cerebral ischemic attack, unspecified: Secondary | ICD-10-CM | POA: Diagnosis not present

## 2022-02-08 DIAGNOSIS — R449 Unspecified symptoms and signs involving general sensations and perceptions: Secondary | ICD-10-CM

## 2022-02-08 DIAGNOSIS — R29818 Other symptoms and signs involving the nervous system: Secondary | ICD-10-CM

## 2022-02-08 DIAGNOSIS — Z888 Allergy status to other drugs, medicaments and biological substances status: Secondary | ICD-10-CM

## 2022-02-08 DIAGNOSIS — C9201 Acute myeloblastic leukemia, in remission: Secondary | ICD-10-CM | POA: Diagnosis present

## 2022-02-08 DIAGNOSIS — N3941 Urge incontinence: Secondary | ICD-10-CM | POA: Diagnosis present

## 2022-02-08 DIAGNOSIS — Z9181 History of falling: Secondary | ICD-10-CM

## 2022-02-08 DIAGNOSIS — Z79899 Other long term (current) drug therapy: Secondary | ICD-10-CM

## 2022-02-08 DIAGNOSIS — D509 Iron deficiency anemia, unspecified: Secondary | ICD-10-CM

## 2022-02-08 DIAGNOSIS — R42 Dizziness and giddiness: Secondary | ICD-10-CM

## 2022-02-08 DIAGNOSIS — N182 Chronic kidney disease, stage 2 (mild): Secondary | ICD-10-CM | POA: Diagnosis present

## 2022-02-08 DIAGNOSIS — H9192 Unspecified hearing loss, left ear: Secondary | ICD-10-CM | POA: Diagnosis present

## 2022-02-08 DIAGNOSIS — Z885 Allergy status to narcotic agent status: Secondary | ICD-10-CM

## 2022-02-08 DIAGNOSIS — K219 Gastro-esophageal reflux disease without esophagitis: Secondary | ICD-10-CM | POA: Diagnosis present

## 2022-02-08 DIAGNOSIS — R200 Anesthesia of skin: Secondary | ICD-10-CM | POA: Diagnosis present

## 2022-02-08 DIAGNOSIS — Z9481 Bone marrow transplant status: Secondary | ICD-10-CM

## 2022-02-08 DIAGNOSIS — Z880 Allergy status to penicillin: Secondary | ICD-10-CM

## 2022-02-08 DIAGNOSIS — E785 Hyperlipidemia, unspecified: Secondary | ICD-10-CM | POA: Diagnosis present

## 2022-02-08 DIAGNOSIS — R110 Nausea: Secondary | ICD-10-CM

## 2022-02-08 DIAGNOSIS — D563 Thalassemia minor: Secondary | ICD-10-CM | POA: Diagnosis present

## 2022-02-08 DIAGNOSIS — Z862 Personal history of diseases of the blood and blood-forming organs and certain disorders involving the immune mechanism: Secondary | ICD-10-CM

## 2022-02-08 DIAGNOSIS — F419 Anxiety disorder, unspecified: Secondary | ICD-10-CM | POA: Diagnosis present

## 2022-02-08 DIAGNOSIS — G629 Polyneuropathy, unspecified: Secondary | ICD-10-CM

## 2022-02-08 DIAGNOSIS — R519 Headache, unspecified: Secondary | ICD-10-CM

## 2022-02-08 LAB — DIFFERENTIAL
Abs Immature Granulocytes: 0.02 10*3/uL (ref 0.00–0.07)
Basophils Absolute: 0 10*3/uL (ref 0.0–0.1)
Basophils Relative: 1 %
Eosinophils Absolute: 0.1 10*3/uL (ref 0.0–0.5)
Eosinophils Relative: 1 %
Immature Granulocytes: 0 %
Lymphocytes Relative: 30 %
Lymphs Abs: 2.4 10*3/uL (ref 0.7–4.0)
Monocytes Absolute: 0.6 10*3/uL (ref 0.1–1.0)
Monocytes Relative: 7 %
Neutro Abs: 5 10*3/uL (ref 1.7–7.7)
Neutrophils Relative %: 61 %

## 2022-02-08 LAB — CBC
HCT: 37.5 % (ref 36.0–46.0)
Hemoglobin: 11.8 g/dL — ABNORMAL LOW (ref 12.0–15.0)
MCH: 23.5 pg — ABNORMAL LOW (ref 26.0–34.0)
MCHC: 31.5 g/dL (ref 30.0–36.0)
MCV: 74.6 fL — ABNORMAL LOW (ref 80.0–100.0)
Platelets: 229 10*3/uL (ref 150–400)
RBC: 5.03 MIL/uL (ref 3.87–5.11)
RDW: 15.2 % (ref 11.5–15.5)
WBC: 8.1 10*3/uL (ref 4.0–10.5)
nRBC: 0 % (ref 0.0–0.2)

## 2022-02-08 LAB — APTT: aPTT: 28 seconds (ref 24–36)

## 2022-02-08 LAB — MAGNESIUM: Magnesium: 1.9 mg/dL (ref 1.7–2.4)

## 2022-02-08 LAB — COMPREHENSIVE METABOLIC PANEL
ALT: 12 U/L (ref 0–44)
AST: 24 U/L (ref 15–41)
Albumin: 4 g/dL (ref 3.5–5.0)
Alkaline Phosphatase: 61 U/L (ref 38–126)
Anion gap: 4 — ABNORMAL LOW (ref 5–15)
BUN: 14 mg/dL (ref 8–23)
CO2: 23 mmol/L (ref 22–32)
Calcium: 8.5 mg/dL — ABNORMAL LOW (ref 8.9–10.3)
Chloride: 114 mmol/L — ABNORMAL HIGH (ref 98–111)
Creatinine, Ser: 0.76 mg/dL (ref 0.44–1.00)
GFR, Estimated: >60 mL/min (ref >60)
Glucose, Bld: 101 mg/dL — ABNORMAL HIGH (ref 70–99)
Potassium: 3.9 mmol/L (ref 3.5–5.1)
Sodium: 141 mmol/L (ref 135–145)
Total Bilirubin: 0.6 mg/dL (ref 0.3–1.2)
Total Protein: 7.4 g/dL (ref 6.5–8.1)

## 2022-02-08 LAB — PROTIME-INR
INR: 1.1 (ref 0.8–1.2)
Prothrombin Time: 13.6 seconds (ref 11.4–15.2)

## 2022-02-08 LAB — VITAMIN B12: Vitamin B-12: 315 pg/mL (ref 180–914)

## 2022-02-08 IMAGING — CT CT HEAD W/O CM
4 series · 16 of 47 positions shown, 18 images · non-contrast
Comparison: None Available.

CLINICAL DATA: 80-year-old female with acute LEFT-sided numbness,
weakness and headache.



[Series 2: head wo · axial · 0.39mm/px · z∈[-130,-10]mm · 7 of 34 slices shown, 9 images]
[im 5/34  brain]
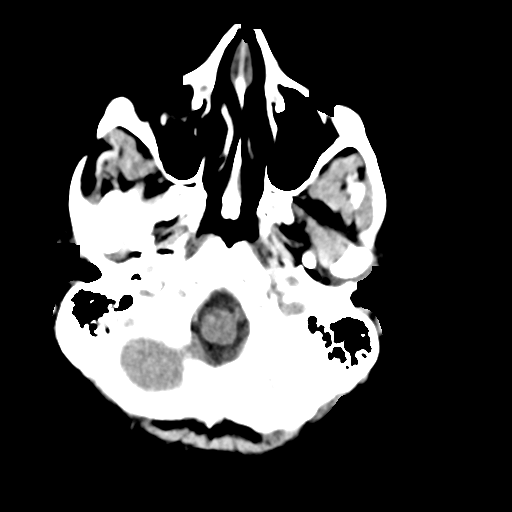
[im 5/34  bone]
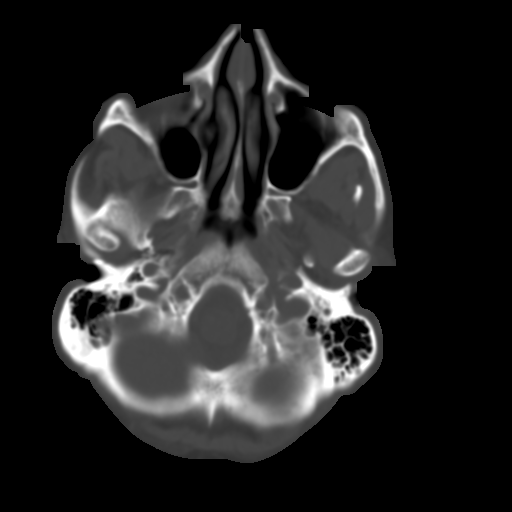
[im 9/34  brain]
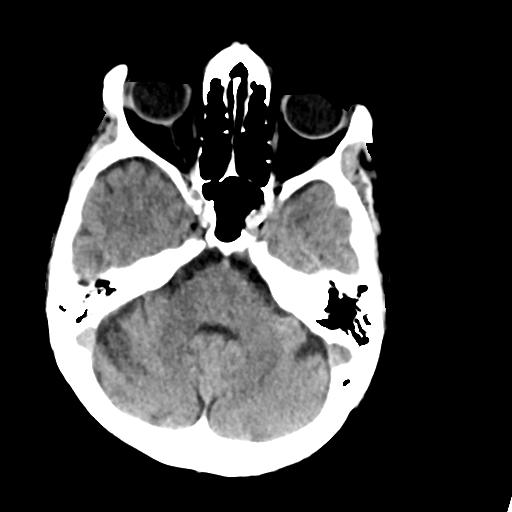
[im 13/34  brain]
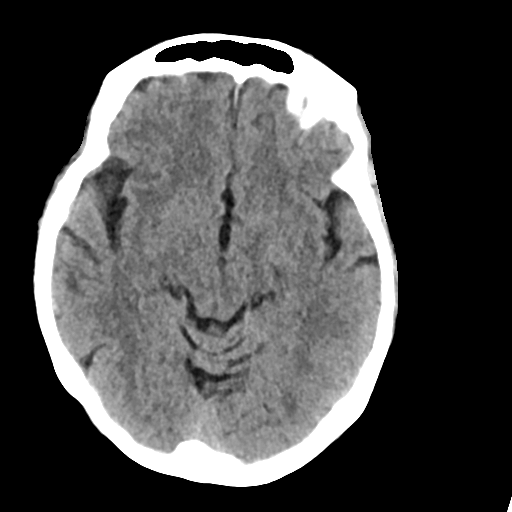
[im 17/34  brain]
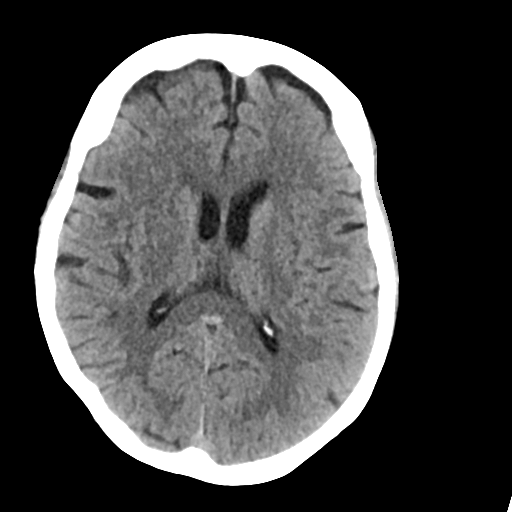
[im 21/34  brain]
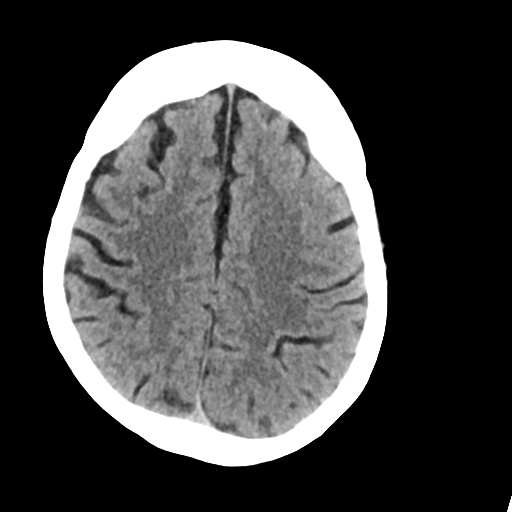
[im 21/34  bone]
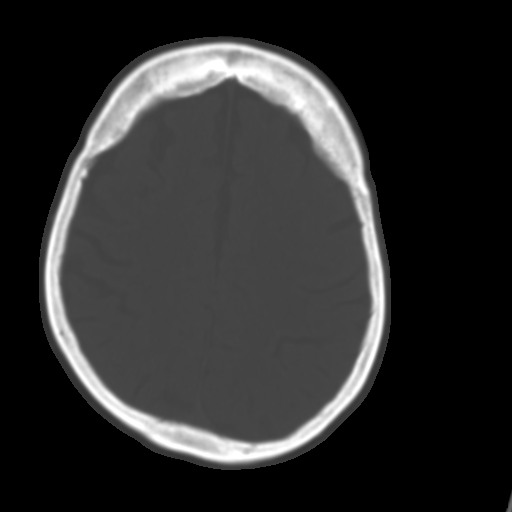
[im 25/34  brain]
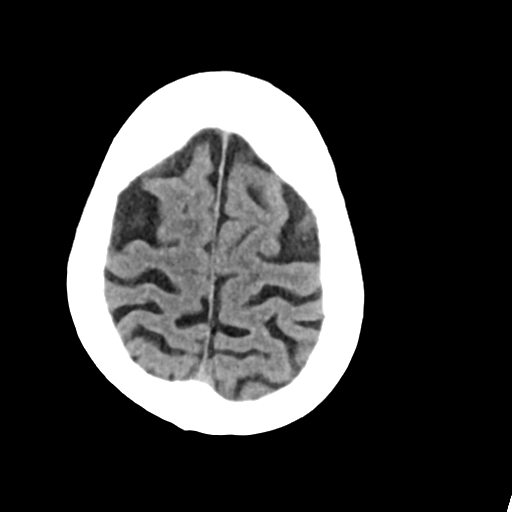
[im 29/34  brain]
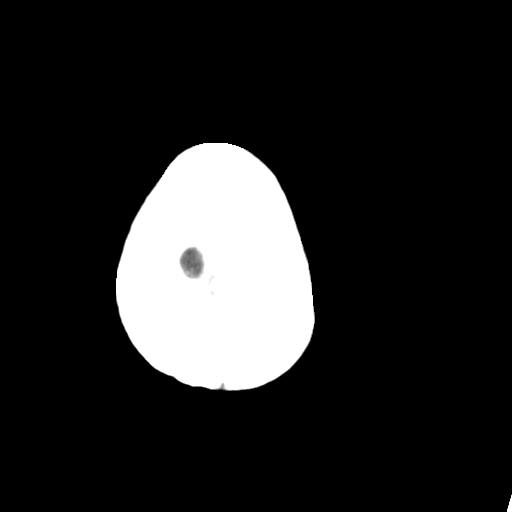

[Series 3: head bone · axial · 0.39mm/px · z∈[-134,-102]mm · 3 of 84 slices shown]
[im 9/84  bone]
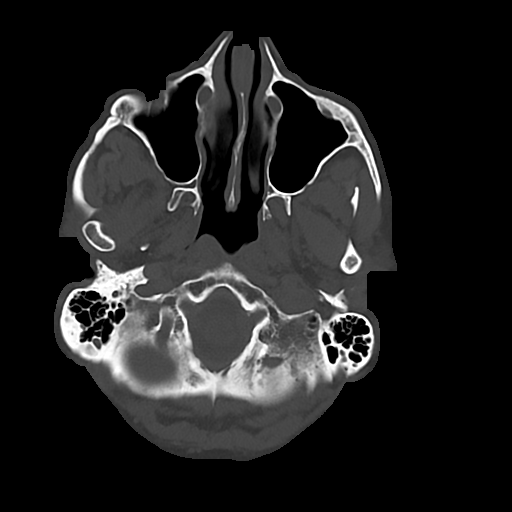
[im 17/84  bone]
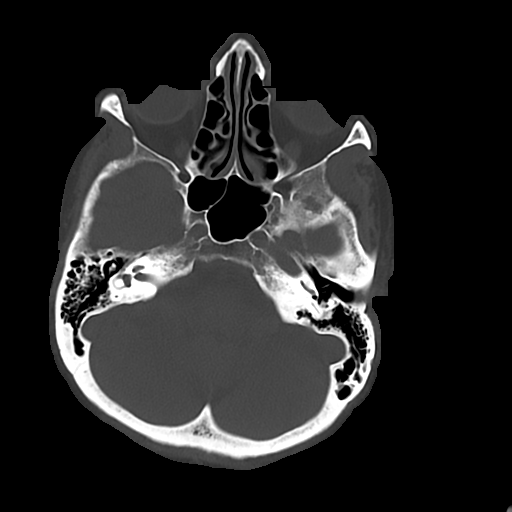
[im 25/84  bone]
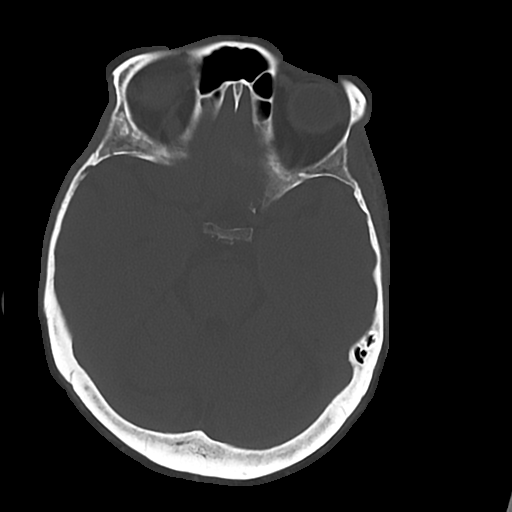

[Series 4: cor soft · coronal · 0.32mm/px · 3 of 65 slices shown]
[im 22/65  brain]
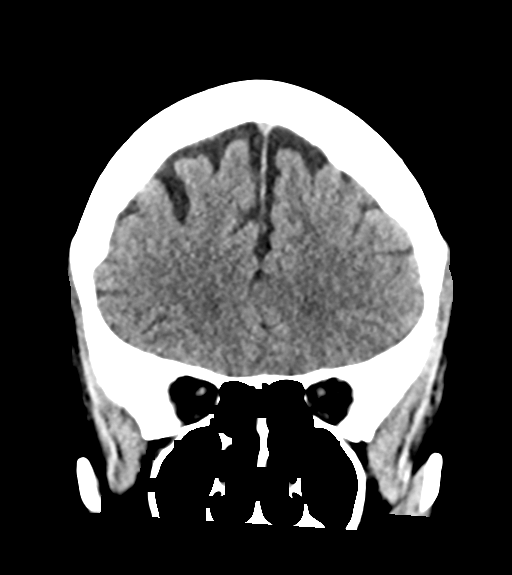
[im 29/65  brain]
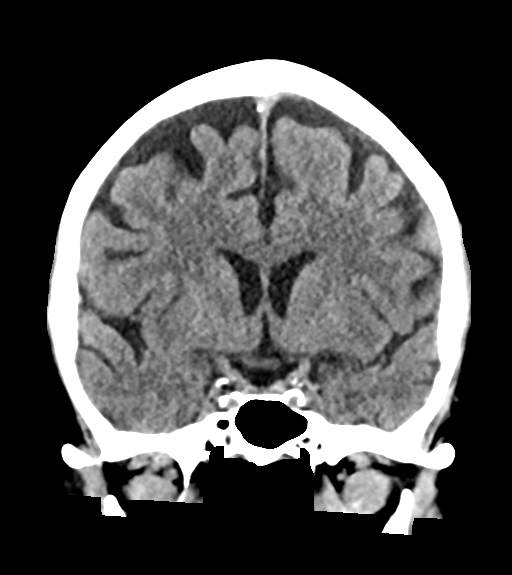
[im 36/65  brain]
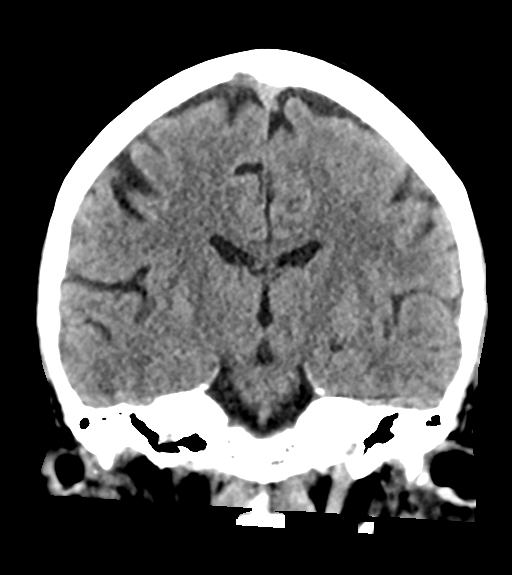

[Series 5: sag soft · sagittal · 0.34mm/px · 3 of 55 slices shown]
[im 19/55  brain]
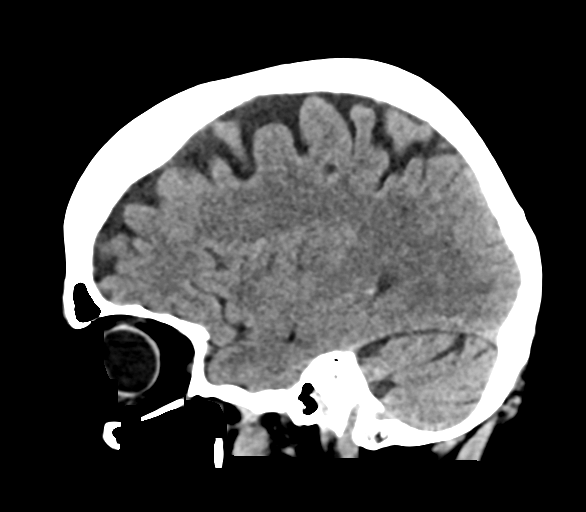
[im 28/55  brain]
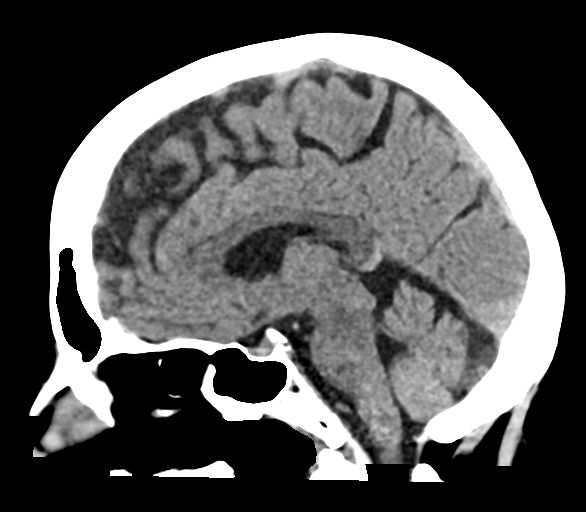
[im 37/55  brain]
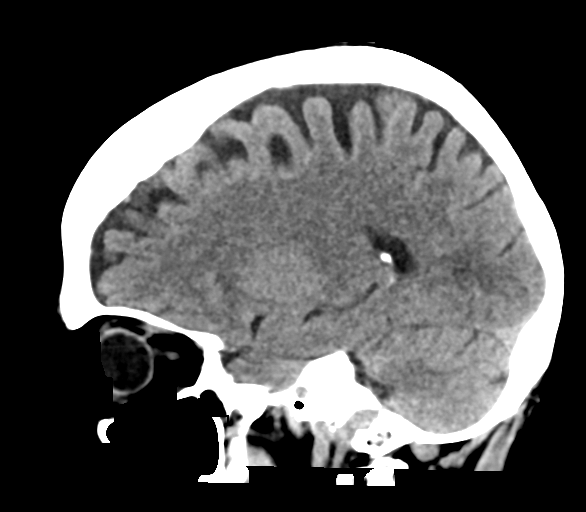

[16 of 47 positions shown; findings below may reference images not displayed]

FINDINGS: Brain: Decreased attenuation within the mid brain noted and may
represent artifact, ischemia or less likely infarct.

Subcortical white matter a small area of subcortical white matter
hypodensity in the RIGHT parietal region (series 2: Images 18-19)

No other evidence of acute infarction, hemorrhage, hydrocephalus,
extra-axial collection or mass lesion/mass effect.

Vascular: Carotid atherosclerotic calcifications are noted.

Skull: Normal. Negative for fracture or focal lesion.

Sinuses/Orbits: No acute abnormality.

Other: None.
IMPRESSION: 1. Decreased attenuation within the mid brain, which may represent
artifact, ischemia or less likely infarct. Consider MRI for further
evaluation as clinically indicated.
2. Small area of nonspecific subcortical white matter hypodensity in
the RIGHT parietal region, age indeterminate. Consider MRI for
further evaluation.

## 2022-02-08 IMAGING — DX DG CHEST 1V PORT
1 series · 1 of 1 positions shown · non-contrast
Comparison: [DATE]

CLINICAL DATA: Left-sided weakness

EXAM:
PORTABLE CHEST 1 VIEW

[chest ap]
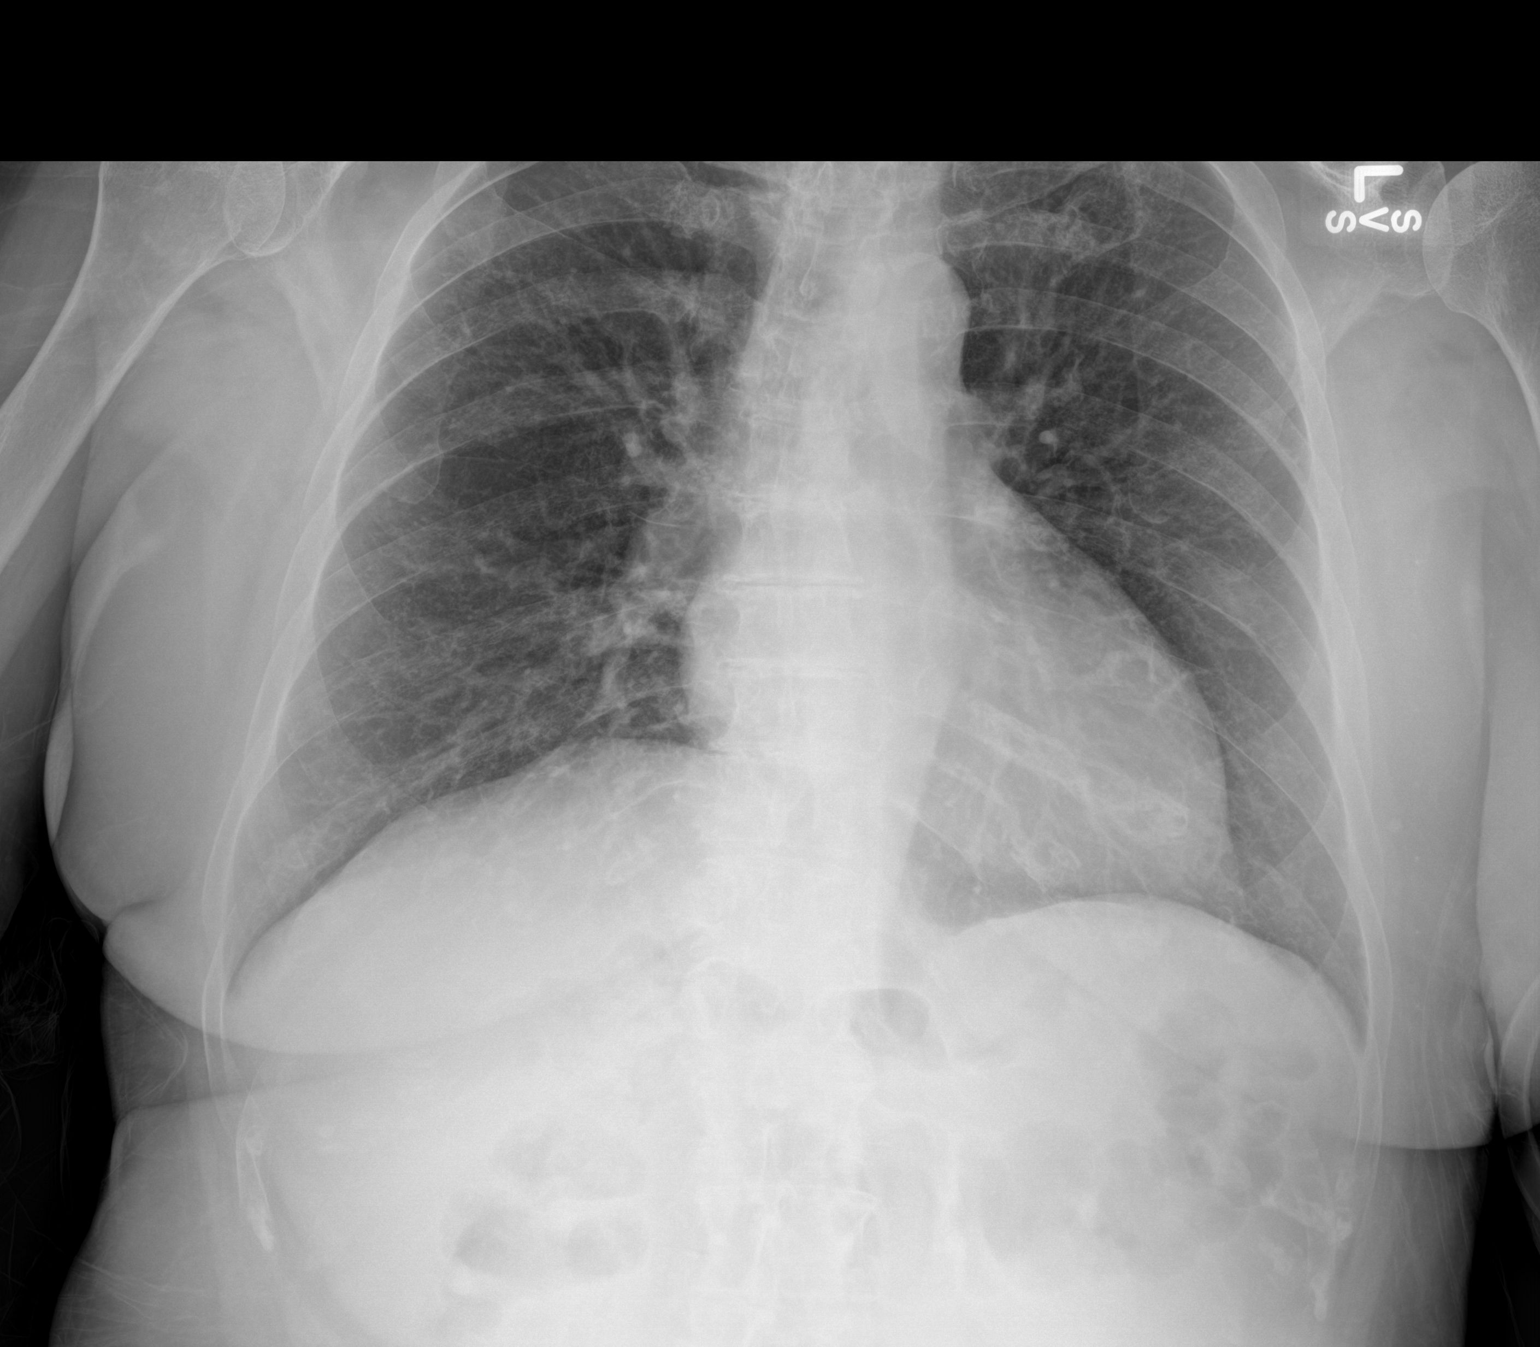

[1 of 1 positions shown; findings below may reference images not displayed]

FINDINGS: Hardware in the cervical spine. Mild diffuse chronic interstitial
opacity. No acute consolidation, pleural effusion, or pneumothorax.
Stable cardiomediastinal silhouette.
IMPRESSION: No active disease. Mild diffuse chronic appearing interstitial
opacity

## 2022-02-08 IMAGING — MR MR HEAD W/O CM
13 series · 48 of 48 positions shown · non-contrast
Comparison: No prior MRI, correlation is made with CT head
[DATE]

CLINICAL DATA: Left-sided numbness and weakness

EXAM:
MRI HEAD WITHOUT CONTRAST
TECHNIQUE: Multiplanar, multiecho pulse sequences of the brain and surrounding
structures were obtained without intravenous contrast.

[Series 5: ax dwi_tracew · axial · 3.0mm · 0.71mm/px · z∈[-95,+63]mm · 3 of 56 slices shown]
[im 1/56]
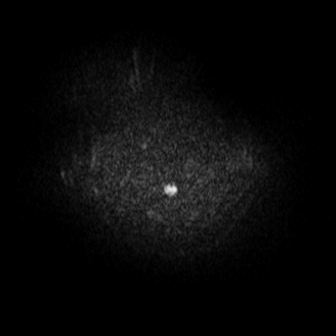
[im 28/56]
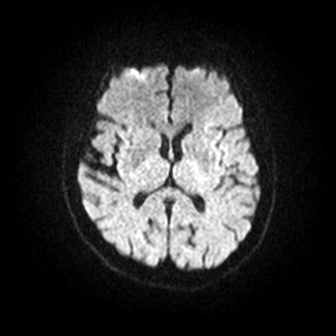
[im 56/56]
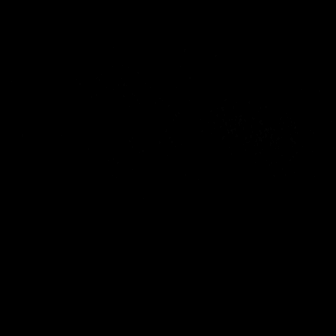

[Series 6: ax dwi_adc · axial · 3.0mm · 0.71mm/px · z∈[-95,+58]mm · 3 of 54 slices shown]
[im 1/54]
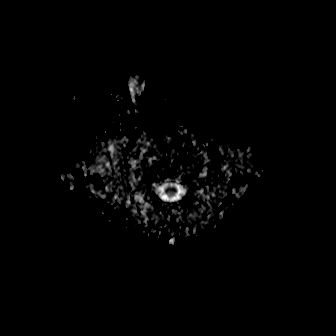
[im 27/54]
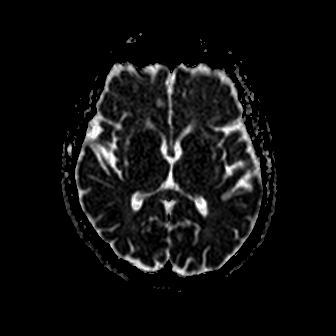
[im 54/54]
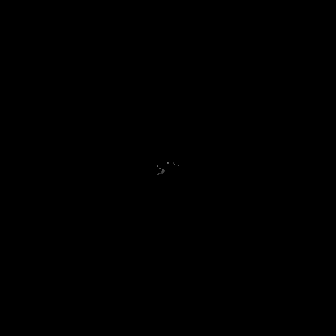

[Series 7: cor dwi_tracew · coronal · 5.0mm · 0.68mm/px · 2 of 40 slices shown]
[im 1/40]
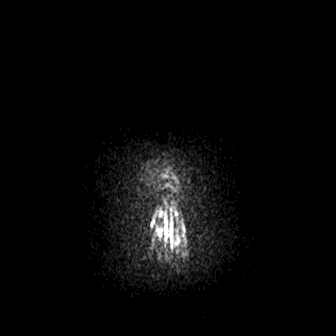
[im 40/40]
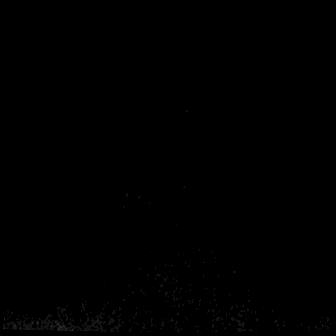

[Series 8: cor dwi_adc · coronal · 5.0mm · 0.68mm/px · 3 of 38 slices shown]
[im 1/38]
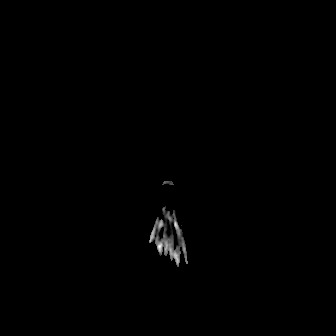
[im 19/38]
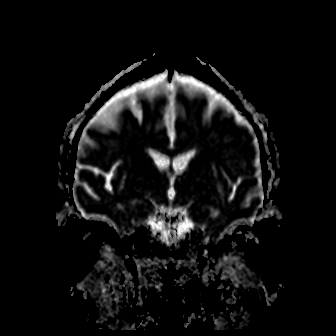
[im 38/38]
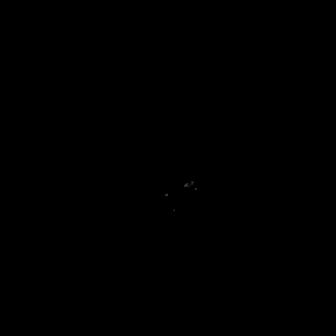

[Series 9: T1 · sagittal · 5.0mm · 0.47mm/px · 2 of 23 slices shown (1 of 2)]
[im 1/23]
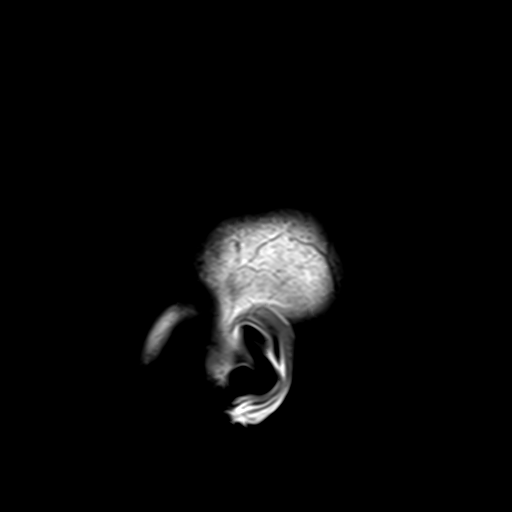
[im 23/23]
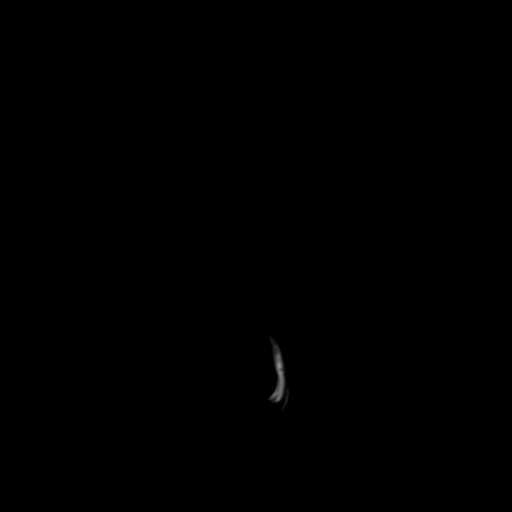

[Series 10: T2 · axial · 5.0mm · 0.86mm/px · z∈[-83,+56]mm · 2 of 25 slices shown (1 of 2)]
[im 1/25]
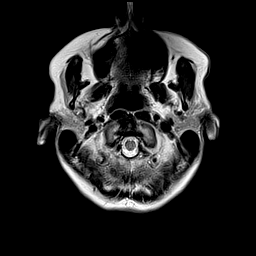
[im 25/25]
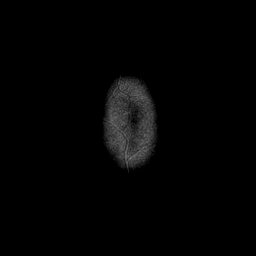

[Series 11: ax swi_mag · axial · 3.0mm · 0.90mm/px · z∈[-94,+65]mm · 4 of 56 slices shown]
[im 1/56]
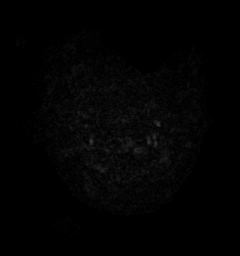
[im 19/56]
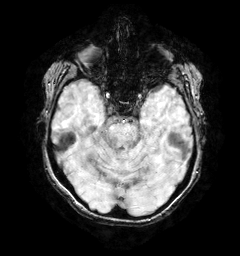
[im 37/56]
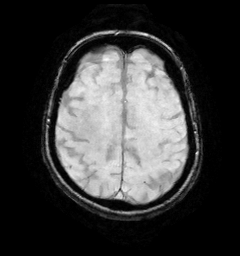
[im 56/56]
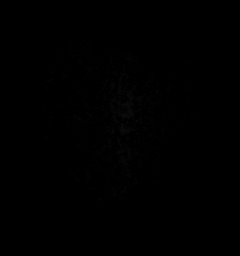

[Series 12: ax swi_pha · axial · 3.0mm · 0.90mm/px · z∈[-94,+65]mm · 4 of 56 slices shown]
[im 1/56]
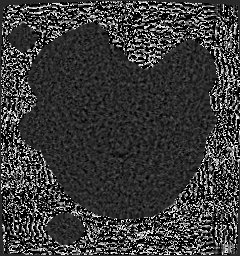
[im 19/56]
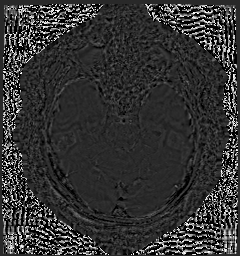
[im 37/56]
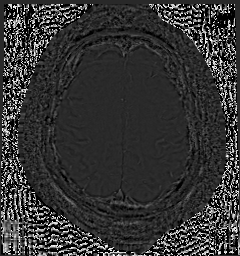
[im 56/56]
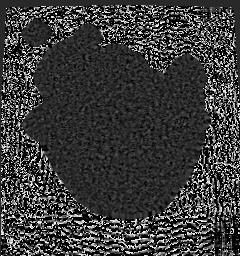

[Series 13: ax swi_swi · axial · 3.0mm · 0.90mm/px · z∈[-94,+65]mm · 4 of 56 slices shown]
[im 1/56]
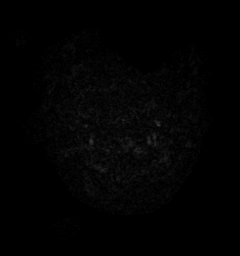
[im 19/56]
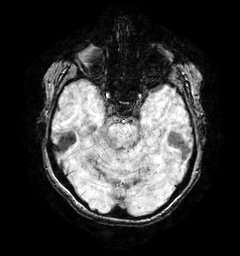
[im 37/56]
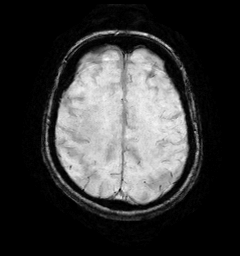
[im 56/56]
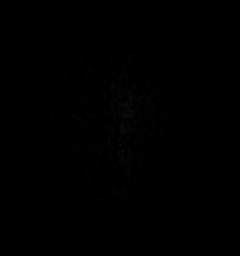

[Series 14: ax swi_swi_mip · axial · 24.0mm · 0.90mm/px · z∈[-84,+54]mm · 3 of 49 slices shown]
[im 1/49]
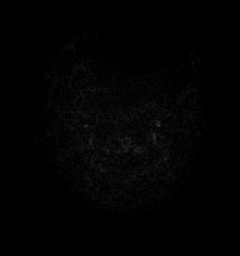
[im 25/49]
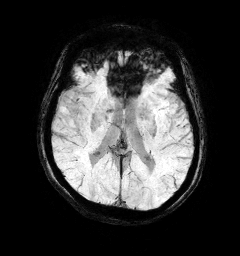
[im 49/49]
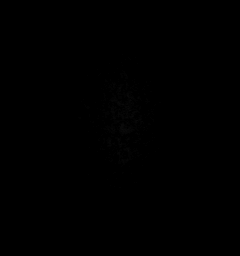

[Series 15: FLAIR · axial · 3.0mm · 0.69mm/px · z∈[-91,+65]mm · 4 of 55 slices shown]
[im 1/55]
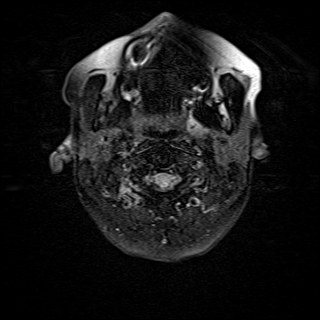
[im 19/55]
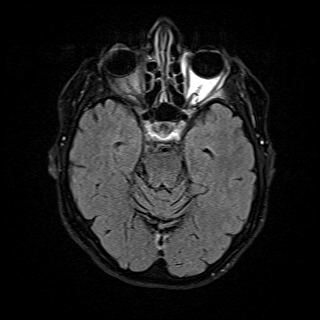
[im 37/55]
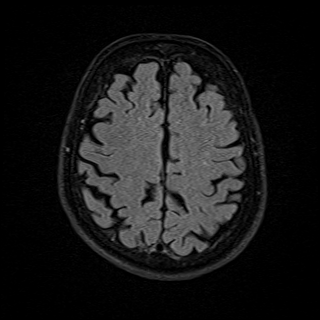
[im 55/55]
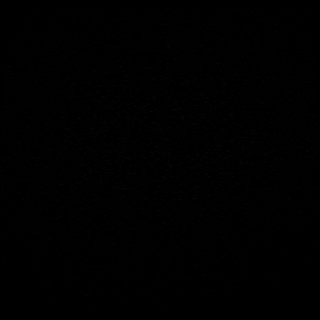

[Series 16: T1 · axial · 1.0mm · 0.98mm/px · z∈[-98,+71]mm · 12 of 175 slices shown (2 of 2)]
[im 1/175]
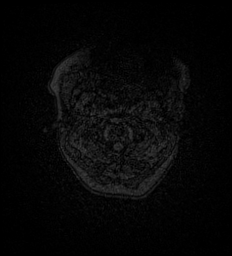
[im 16/175]
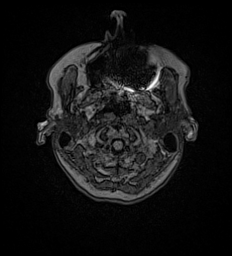
[im 32/175]
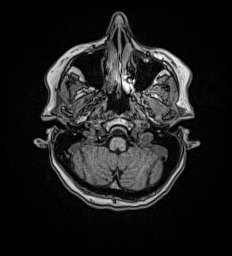
[im 48/175]
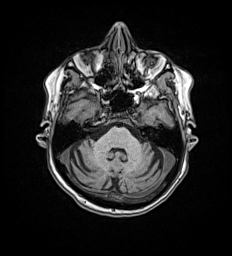
[im 64/175]
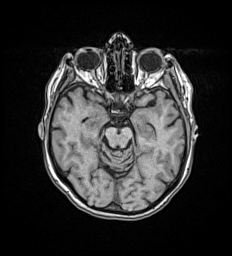
[im 80/175]
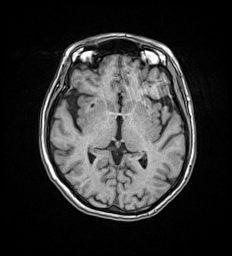
[im 95/175]
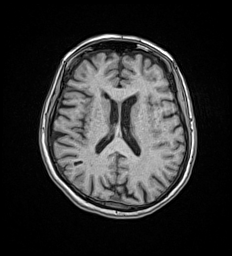
[im 111/175]
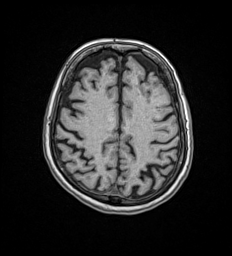
[im 127/175]
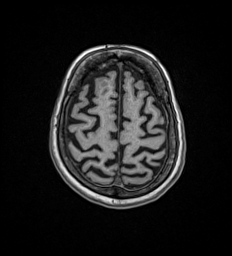
[im 143/175]
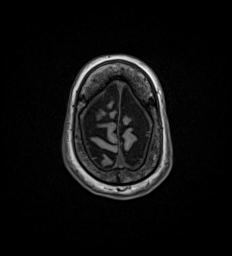
[im 159/175]
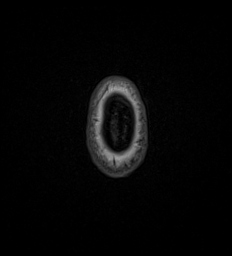
[im 175/175]
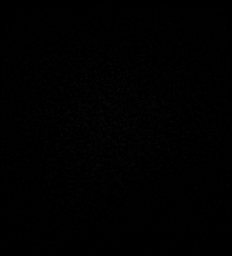

[Series 17: T2 · coronal · 5.0mm · 0.86mm/px · 2 of 30 slices shown (2 of 2)]
[im 1/30]
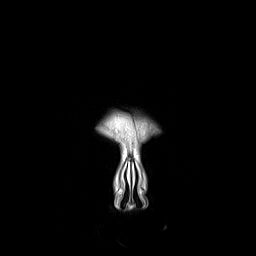
[im 30/30]
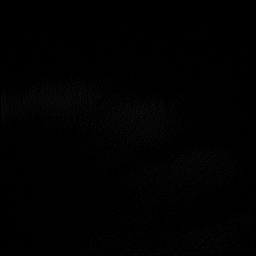

[48 of 48 positions shown; findings below may reference images not displayed]

FINDINGS: Brain: No restricted diffusion to suggest acute or subacute infarct.
No acute hemorrhage, mass mass effect, or midline shift. No
hemosiderin deposition to suggest remote hemorrhage. No
hydrocephalus or extra-axial collection. Minimal T2 hyperintense
signal in the periventricular white matter, likely the sequela of
mild chronic small vessel ischemic disease. The right parietal
hypodensity on the prior CT correlates with a possible remote
infarct, although this is not associated with T2 hyperintense signal
on FLAIR as would be expected with a remote infarct. No correlate is
seen for the decreased attenuation in the midbrain on CT.

Vascular: Normal arterial flow voids.

Skull and upper cervical spine: Normal marrow signal.

Sinuses/Orbits: Evaluation of the left maxillary sinus is somewhat
limited by susceptibility artifact. Otherwise clear paranasal
sinuses. Status post bilateral lens replacements.

Other: Trace fluid in the right mastoid air cells.
IMPRESSION: No acute intracranial process.

## 2022-02-08 MED ORDER — LORAZEPAM 2 MG/ML IJ SOLN
1.0000 mg | INTRAMUSCULAR | Status: DC | PRN
  Administered 2022-02-11: 1 mg via INTRAVENOUS
  Filled 2022-02-08: qty 1

## 2022-02-08 MED ORDER — LORAZEPAM 0.5 MG PO TABS: 0.5000 mg | ORAL_TABLET | Freq: Four times a day (QID) | ORAL | Status: DC | PRN

## 2022-02-08 MED ORDER — LORAZEPAM 2 MG/ML IJ SOLN
0.5000 mg | Freq: Once | INTRAMUSCULAR | Status: AC
Start: 2022-02-08 — End: 2022-02-08
  Administered 2022-02-08: 0.5 mg via INTRAVENOUS
  Filled 2022-02-08: qty 1

## 2022-02-08 MED ORDER — SENNOSIDES-DOCUSATE SODIUM 8.6-50 MG PO TABS: 1.0000 | ORAL_TABLET | Freq: Every evening | ORAL | Status: DC | PRN

## 2022-02-08 MED ORDER — ACETAMINOPHEN 650 MG RE SUPP: 650.0000 mg | RECTAL | Status: DC | PRN

## 2022-02-08 MED ORDER — METHOCARBAMOL 500 MG PO TABS
500.0000 mg | ORAL_TABLET | Freq: Three times a day (TID) | ORAL | Status: DC | PRN
Start: 2022-02-08 — End: 2022-02-14

## 2022-02-08 MED ORDER — ACETAMINOPHEN 325 MG PO TABS
650.0000 mg | ORAL_TABLET | ORAL | Status: DC | PRN
  Administered 2022-02-08 – 2022-02-12 (×6): 650 mg via ORAL
  Filled 2022-02-08 (×6): qty 2

## 2022-02-08 MED ORDER — HYDRALAZINE HCL 20 MG/ML IJ SOLN: 5.0000 mg | Freq: Four times a day (QID) | INTRAMUSCULAR | Status: AC | PRN

## 2022-02-08 MED ORDER — ACETAMINOPHEN 160 MG/5ML PO SOLN: 650.0000 mg | ORAL | Status: DC | PRN

## 2022-02-08 MED ORDER — STROKE: EARLY STAGES OF RECOVERY BOOK: Freq: Once | Status: AC

## 2022-02-08 MED ORDER — PANTOPRAZOLE SODIUM 40 MG PO TBEC
40.0000 mg | DELAYED_RELEASE_TABLET | Freq: Every day | ORAL | Status: DC
  Administered 2022-02-08 – 2022-02-14 (×7): 40 mg via ORAL
  Filled 2022-02-08 (×6): qty 1

## 2022-02-08 MED ORDER — ALBUTEROL SULFATE (2.5 MG/3ML) 0.083% IN NEBU: 2.5000 mg | INHALATION_SOLUTION | RESPIRATORY_TRACT | Status: DC | PRN

## 2022-02-08 MED ORDER — MECLIZINE HCL 25 MG PO TABS
12.5000 mg | ORAL_TABLET | Freq: Two times a day (BID) | ORAL | Status: DC | PRN
Start: 2022-02-08 — End: 2022-02-14
  Administered 2022-02-08 – 2022-02-12 (×4): 12.5 mg via ORAL
  Filled 2022-02-08 (×3): qty 0.5
  Filled 2022-02-08: qty 1
  Filled 2022-02-08: qty 0.5

## 2022-02-08 MED ORDER — ENOXAPARIN SODIUM 40 MG/0.4ML IJ SOSY
40.0000 mg | PREFILLED_SYRINGE | Freq: Every day | INTRAMUSCULAR | Status: DC
  Administered 2022-02-08 – 2022-02-13 (×6): 40 mg via SUBCUTANEOUS
  Filled 2022-02-08 (×6): qty 0.4

## 2022-02-08 NOTE — ED Notes (Signed)
Patient transported to MRI 

## 2022-02-08 NOTE — H&P (Addendum)
History and Physical   Shirrell [facility] GDJ:[id number] DOB: 08-08-1941 DOA: 02/08/2022  PCP: Lesleigh Noe, MD  Outpatient Specialists: Dr. Matilde Sprang, urology Patient coming from: Home  I have personally briefly reviewed patient's old medical records in [place].  Chief Concern: Left-sided weakness and numbness  HPI: [name] is a 81 year old female with history of hyperlipidemia, GERD, anxiety, history of acute left otitis media (01/19/22), vertigo, who presents emergency department for chief concerns of left-sided weakness, left-sided facial droop upon waking up in the morning.  Initial vitals in the ED showed temperature 98.1, respiration rate of 18, heart rate 76, blood pressure 191/92, SPO2 100% on room air.  Serum sodium 141, potassium 3.9, chloride 114, bicarb 23, BUN of 14, serum creatinine 0.76, GFR greater than 60, nonfasting blood glucose 101, WBC 8.1, hemoglobin 11.8, platelets of 229.  ED treatment: Ativan 0.5 mg IV one-time dose  At bedside she is able to tell me her full name, her age, her current location, and the current calendar year.  She does appear anxious and does have a posterior headache.  She denies double vision.    She states that over the last several month she has been having intermittent and sometimes progressive numbness and tingling of the left side of her face along with upper and lower extremities.  She states her head feels full constantly.  She has been evaluated for possible ear infection and was told that there was no ear infection.  She was then referred to an ENT who then ordered an MRI.  She comes in today because the dizziness has worsened.  She reports not being able to control how she feels. Per daughter, last week she was so weak and imbalance, that she fell over and hit her head.  Patient denies any loss of consciousness or syncope.  The dizziness is not positional.  The face fullness is not positional.  Though she does feel better  when she lays down. She denies feeling this way before. She denies trauma to her person.   She denies fever, chest pain, shortness of breath, abdominal pain, dysuria, hematuria, diarrhea, blood in her stool, swelling in her legs.  She endorses loose stool that has started for a few days.  She denies any changes to her diet or recent antibiotic use.  She states the stool is brown and negative for blood.   Daughter notes that in April of this year, her bladder incontinent medication was changed to mirabegron.  It was then that the symptoms started.  Her daughter then advised her to stop taking the medication about 2 weeks ago however the symptoms did not resolve.  Patient daughter denies recent tick bites.  She denies recent ship cruise.  Patient does not like the water. She denies recent international travel.   Social history: She denies etoh, tobacco use, recreational drug use. 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  ROS: Constitutional: no weight change, no fever ENT/Mouth: no sore throat, no rhinorrhea Eyes: no eye pain, no vision changes Cardiovascular: no chest pain, no dyspnea,  no edema, no palpitations Respiratory: no cough, no sputum, no wheezing Gastrointestinal: + nausea, no vomiting, no diarrhea, no constipation Genitourinary: no urinary incontinence, no dysuria, no hematuria Musculoskeletal: no arthralgias, no myalgias Skin: no skin lesions, no pruritus, Neuro: + weakness, no loss of consciousness, no syncope, + imbalance, + numbness of her left side of  her face, left arm, and left leg Psych: no anxiety, no depression, + decrease appetite Heme/Lymph: no bruising, no bleeding  ED Course: Discussed with emergency medicine provider, patient requiring hospitalization for chief concerns of strokelike symptoms.  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  Small area of nonspecific subcortical white matter hypodensity in the right parietal region, age indeterminate.  ConsiderMRI for further evaluation. -  A.m. team to consult neurology if indicated - Check B12 - Complete echo has not been ordered due to MRI was negative for stroke - Fasting lipid and A1c ordered - Frequent neuro vascular checks - N.p.o. pending swallow study - PT, OT - Fall precaution - Admit to med telemetry  Weakness - Check b12 and magnesium level - Check stat port cxr  Left facial numbness - Check B12, magnesium level  Urge incontinence - Holding home mirabegron  GERD (gastroesophageal reflux disease) - PPI  AML (acute myeloid leukemia) in remission ([place]) - Continue outpatient follow-up as appropriate  Pending med reconciliation  Chart reviewed.   01/19/22: outpatient pcp. Diagnosed with vertigo and acute otitis media. She was prescribed meclizine and cefinir.   DVT prophylaxis: Enoxaparin Code Status: Full code Diet: Heart healthy Family Communication: Updated daughter at bedside who is also healthcare power of attorney Disposition Plan: Pending clinical course Consults called: None at this time Admission status: Telemetry medical, observation  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[place])    GERD (gastroesophageal reflux disease)    Heart murmur    Osteopenia    Thrush    Past Surgical History:  Procedure Laterality  Date   BONE MARROW TRANSPLANT     BREAST LUMPECTOMY Left    fatty tumor,1970's   CATARACT EXTRACTION     CESAREAN SECTION     HYSTEROTOMY     LYMPH GLAND EXCISION     MENISCUS REPAIR Right    NECK SURGERY     SALPINGECTOMY     WISDOM TOOTH EXTRACTION     Social History:  reports that she quit smoking about 52 years ago. Her smoking use included cigarettes. She has a 6.00 pack-year smoking history. She has never used smokeless tobacco. She reports that she does not currently use alcohol. She reports that she does not use drugs.  Allergies  Allergen Reactions   Jadenu [Deferasirox] Other (See Comments)    Elevated Iron, headache and rash   Levofloxacin Other (See Comments)   Ondansetron Hcl Other (See Comments)   Codeine Palpitations   Oxycodone-Acetaminophen Palpitations   Penicillins Rash   Family History  Problem Relation Age of Onset   Retinitis pigmentosa Mother    Stroke Mother    Liver cancer Father    Heart disease Father    Heart disease Brother    Blindness Brother    Family history: Family history reviewed and not pertinent  Prior to Admission medications   Medication Sig Start Date End Date Taking? Authorizing Provider  albuterol (VENTOLIN HFA) 108 (  90 Base) MCG/ACT inhaler Inhale 1-2 puffs into the lungs as needed for wheezing or shortness of breath. 02/13/20   Lesleigh Noe, MD  atorvastatin (LIPITOR) 10 MG tablet Take 1 tablet (10 mg total) by mouth daily. 12/22/21   Lesleigh Noe, MD  BIOTIN PO Take 1,000 mcg by mouth daily.     [provider]  calcium carbonate (OS-CAL - DOSED IN MG OF ELEMENTAL CALCIUM) 1250 (500 Ca) MG tablet Take 1 tablet by mouth.    [provider]  esomeprazole (NEXIUM) 20 MG capsule TAKE 1 CAPSULE (20 MG TOTAL) BY MOUTH DAILY AT 12 NOON. 09/11/21   [name], FNP  hydrocortisone 2.5 % cream Apply twice daily, mixed with ketoconazole for 1-2 weeks 11/19/21   Brendolyn Patty, MD  ibandronate (BONIVA) 150 MG tablet  TAKE 1 TABLET (150 MG TOTAL) BY MOUTH EVERY 30 (THIRTY) DAYS. 11/18/21   Lesleigh Noe, MD  ketoconazole (NIZORAL) 2 % cream Apply to breast folds twice daily, mixed with hydrocortisone cream,  for 1-2 weeks, decrease to once daily as directed 11/19/21   Brendolyn Patty, MD  lidocaine (LIDODERM) 5 % Place 1 patch onto the skin daily. Remove & Discard patch within 12 hours or as directed by MD 12/22/21   Lesleigh Noe, MD  meclizine (ANTIVERT) 12.5 MG tablet TAKE 1 TABLET BY MOUTH TWICE A DAY. TAKE 1 AT BEDTIME AS NEEDED FOR VERTIGO 01/22/22   Lesleigh Noe, MD  methocarbamol (ROBAXIN) 500 MG tablet Take by mouth. 12/23/21   [provider]  mirabegron ER (MYRBETRIQ) 50 MG TB24 tablet Take 1 tablet (50 mg total) by mouth daily. 01/08/22 02/07/22  [name], FNP  Naproxen Sodium 220 MG CAPS Take by mouth.    [provider]  POTASSIUM PO Take 99 mg by mouth daily.     [provider]  prochlorperazine (COMPAZINE) 10 MG tablet Take 1 tablet (10 mg total) by mouth daily. 11/05/21   Lesleigh Noe, MD  VITAMIN D PO Take 50,000 mcg by mouth daily.     [provider]   Physical Exam: Vitals:   02/08/22 1900 02/08/22 1930 02/08/22 2000 02/08/22 2129  BP: (!) 156/82 (!) 159/57 (!) 147/73 (!) 166/68  Pulse: 74 84 78 76  Resp: '12 14 15 18  '$ Temp:   98.2 F (36.8 C) 98.1 F (36.7 C)  TempSrc:   Oral Oral  SpO2: 100% 99% 100% 100%  Weight:      Height:       Constitutional: appears age-appropriate, frail, NAD, calm, comfortable Eyes: PERRL, lids and conjunctivae normal ENMT: Mucous membranes are moist. Posterior pharynx clear of any exudate or lesions. Age-appropriate dentition. Hearing appropriate.  Bilateral tympanic membrane appears clear and cone of light is clearly visible.  No signs of infection in the ear. Neck: normal, supple, no masses, no thyromegaly Respiratory: clear to auscultation bilaterally, no wheezing, no crackles. Normal respiratory effort. No  accessory muscle use.  Cardiovascular: Regular rate and rhythm, no murmurs / rubs / gallops. No extremity edema. 2+ pedal pulses. No carotid bruits.  Abdomen: no tenderness, no masses palpated, no hepatosplenomegaly. Bowel sounds positive.  Musculoskeletal: no clubbing / cyanosis. No joint deformity upper and lower extremities. Good ROM, no contractures, no atrophy. Normal muscle tone.  Skin: no rashes, lesions, ulcers. No induration Neurologic: Sensation intact. Strength 5/5 in her right upper and lower extremities. Strength is 4-4.5/5 on her left upper and lower extremities. Left corner of her mouth  is mild-moderately lower than the right.  Psychiatric: Normal judgment and insight. Alert and oriented x 3. Anxious mood.   EKG: independently reviewed, showing sinus rhythm with rate of 79, QTc 482  Chest x-ray on Admission: ordered  CT HEAD WO CONTRAST  Result Date: 02/08/2022 CLINICAL DATA:  81 year old female with acute LEFT-sided numbness, weakness and headache. EXAM: CT HEAD WITHOUT CONTRAST TECHNIQUE: Contiguous axial images were obtained from the base of the skull through the vertex without intravenous contrast. RADIATION DOSE REDUCTION: This exam was performed according to the departmental dose-optimization program which includes automated exposure control, adjustment of the mA and/or kV according to patient size and/or use of iterative reconstruction technique. COMPARISON:  None Available. FINDINGS: Brain: Decreased attenuation within the mid brain noted and may represent artifact, ischemia or less likely infarct. Subcortical white matter a small area of subcortical white matter hypodensity in the RIGHT parietal region (series 2: Images 18-19) No other evidence of acute infarction, hemorrhage, hydrocephalus, extra-axial collection or mass lesion/mass effect. Vascular: Carotid atherosclerotic calcifications are noted. Skull: Normal. Negative for fracture or focal lesion. Sinuses/Orbits: No acute  abnormality. Other: None. IMPRESSION: 1. Decreased attenuation within the mid brain, which may represent artifact, ischemia or less likely infarct. Consider MRI for further evaluation as clinically indicated. 2. Small area of nonspecific subcortical white matter hypodensity in the RIGHT parietal region, age indeterminate. Consider MRI for further evaluation. Electronically Signed   By: Margarette Canada M.D.   On: 02/08/2022 16:46   MR BRAIN WO CONTRAST  Result Date: 02/08/2022 CLINICAL DATA:  Left-sided numbness and weakness EXAM: MRI HEAD WITHOUT CONTRAST TECHNIQUE: Multiplanar, multiecho pulse sequences of the brain and surrounding structures were obtained without intravenous contrast. COMPARISON:  No prior MRI, correlation is made with CT head 02/08/2022 FINDINGS: Brain: No restricted diffusion to suggest acute or subacute infarct. No acute hemorrhage, mass mass effect, or midline shift. No hemosiderin deposition to suggest remote hemorrhage. No hydrocephalus or extra-axial collection. Minimal T2 hyperintense signal in the periventricular white matter, likely the sequela of mild chronic small vessel ischemic disease. The right parietal hypodensity on the prior CT correlates with a possible remote infarct, although this is not associated with T2 hyperintense signal on FLAIR as would be expected with a remote infarct. No correlate is seen for the decreased attenuation in the midbrain on CT. Vascular: Normal arterial flow voids. Skull and upper cervical spine: Normal marrow signal. Sinuses/Orbits: Evaluation of the left maxillary sinus is somewhat limited by susceptibility artifact. Otherwise clear paranasal sinuses. Status post bilateral lens replacements. Other: Trace fluid in the right mastoid air cells. IMPRESSION: No acute intracranial process. Electronically Signed   By: Merilyn Baba M.D.   On: 02/08/2022 18:08    Labs on Admission: I have personally reviewed following labs  CBC: Recent Labs  Lab  02/08/22 1613  WBC 8.1  NEUTROABS 5.0  HGB 11.8*  HCT 37.5  MCV 74.6*  PLT 812   Basic Metabolic Panel: Recent Labs  Lab 02/08/22 1613  NA 141  K 3.9  CL 114*  CO2 23  GLUCOSE 101*  BUN 14  CREATININE 0.76  CALCIUM 8.5*   GFR: Estimated Creatinine Clearance: 48.7 mL/min (by C-G formula based on SCr of 0.76 mg/dL).  Liver Function Tests: Recent Labs  Lab 02/08/22 1613  AST 24  ALT 12  ALKPHOS 61  BILITOT 0.6  PROT 7.4  ALBUMIN 4.0   Coagulation Profile: Recent Labs  Lab 02/08/22 1613  INR 1.1   Urine analysis:  Component Value Date/Time   COLORURINE STRAW (A) 12/20/2021 1300   APPEARANCEUR CLEAR (A) 12/20/2021 1300   APPEARANCEUR Cloudy (A) 11/24/2021 0911   LABSPEC 1.041 (H) 12/20/2021 1300   PHURINE 6.0 12/20/2021 1300   GLUCOSEU NEGATIVE 12/20/2021 1300   HGBUR NEGATIVE 12/20/2021 1300   BILIRUBINUR NEGATIVE 12/20/2021 1300   BILIRUBINUR Negative 11/24/2021 0911   KETONESUR NEGATIVE 12/20/2021 1300   PROTEINUR NEGATIVE 12/20/2021 1300   NITRITE NEGATIVE 12/20/2021 1300   LEUKOCYTESUR NEGATIVE 12/20/2021 1300   Dr. Tobie Poet [facility]  If 7PM-7AM, please contact overnight-coverage provider If 7AM-7PM, please contact day coverage provider [email or web address]  02/08/2022, 9:35 PM

## 2022-02-08 NOTE — Assessment & Plan Note (Signed)
   Hold mirabegron

## 2022-02-08 NOTE — Progress Notes (Signed)
Chaplain responded to request for AD; materials and education provided.  Chaplain directed pt/family to request a chaplain during business hours if/when she would like notarization. Chaplain also provided hospitality and support.    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    Please contact when/if pt decides to pursue.    [place]us [place], [place] Pager:  [phone]    02/08/22 1900  Clinical Encounter Type  Visited With Patient and family together  Visit Type Initial;Follow-up (AD)  Referral From Patient  Consult/Referral To Chaplain  Spiritual Encounters  Spiritual Needs Emotional  Stress Factors  Patient Stress Factors Health changes  Family Stress Factors Family relationships  Advance Directives (For Healthcare)  Does Patient Have a Medical Advance Directive? No (Pt requested materials, which were provided with instruction to call a chaplain during business hours if/when she would like notarization.)  Would patient like information on creating a medical advance directive? Yes (Inpatient - patient requests chaplain consult to create a medical advance directive)

## 2022-02-08 NOTE — Assessment & Plan Note (Addendum)
Normal vitamin B12 and Mg level. Chest x-ray on admission negative. Unclear etiology.  Suspect related to the underlying neurologic complaints. -- PT/OT evaluations -- Fall precautions

## 2022-02-08 NOTE — Assessment & Plan Note (Addendum)
No acute issues.  Outpatient follow-up as appropriate.  CSF to send out for paraneoplastic evaluation

## 2022-02-08 NOTE — ED Notes (Signed)
Pt with c/o left sided headache, EDP notified.  Pt and family asking for healthcare POA paperwork, chaplin called, chaplin to bring paperwork to pt's room.

## 2022-02-08 NOTE — ED Provider Notes (Signed)
[facility] Provider Note    Event Date/Time   First MD Initiated Contact with Patient 02/08/22 1604     (approximate)  History   Chief Complaint: Numbness and Weakness  HPI  Roberta Curtis is a 81 y.o. female with a past medical history of AML in remission since 2007, presents to the emergency department for left-sided deficits.  According to the patient she was recently diagnosed with vertigo and prescribed meclizine.  Has been taking this for intermittent dizziness however this morning she has noticed that her left arm leg and face feel numb and somewhat weak.  Patient is quite anxious in the emergency department.  Daughter states yesterday she was much more normal although had been experiencing some intermittent dizziness.  Today appears to have a left facial droop which is not normal per daughter.  Patient denies any headache.  Patient ENT who diagnosed with vertigo has scheduled her for an MRI this coming Thursday.  Physical Exam   Triage Vital Signs: ED Triage Vitals  Enc Vitals Group     BP 02/08/22 1557 (!) 191/92     Pulse Rate 02/08/22 1557 76     Resp 02/08/22 1557 18     Temp 02/08/22 1557 98.1 F (36.7 C)     Temp Source 02/08/22 1557 Oral     SpO2 02/08/22 1557 100 %     Weight 02/08/22 1558 140 lb (63.5 kg)     Height 02/08/22 1558 '5\' 1"'$  (1.549 m)     Head Circumference --      Peak Flow --      Pain Score 02/08/22 1558 0     Pain Loc --      Pain Edu? --      Excl. in [place]? --     Most recent vital signs: Vitals:   02/08/22 1557 02/08/22 1624  BP: (!) 191/92   Pulse: 76 91  Resp: 18 13  Temp: 98.1 F (36.7 C)   SpO2: 100% 100%    General: Awake, no distress.  CV:  Good peripheral perfusion.  Regular rate and rhythm  Resp:  Normal effort.  Equal breath sounds bilaterally.  Abd:  No distention.  Soft, nontender.  No rebound or guarding. Other:  Eye examination patient appears to have a mild left facial droop, states  decreased sensation left face arm and leg.  Patient has equal grip strength bilaterally with no pronator drift but does have weakness lifting her left lower extremity.  4/5 strength in left leg versus 5/5 in the right leg.   ED Results / Procedures / Treatments   EKG  EKG viewed and interpreted by myself shows sinus rhythm at 79 bpm with a narrow QRS, left axis deviation, largely normal intervals and no concerning ST changes.  RADIOLOGY  I have reviewed the MRI of the brain I do not see any obvious or significant abnormality on my interpretation. Radiology is read the MRI is negative for infarct.   MEDICATIONS ORDERED IN ED: Medications  LORazepam (ATIVAN) injection 0.5 mg (0.5 mg Intravenous Given 02/08/22 1627)     IMPRESSION / MDM / ASSESSMENT AND PLAN / ED COURSE  I reviewed the triage vital signs and the nursing notes.  Patient's presentation is most consistent with acute presentation with potential threat to life or bodily function.  Patient presents emergency department for intermittent dizziness over the past several days along with left upper extremity and left face deficits since this morning.  Last known normal was last night.  Patient is outside of any TNK window.  However on examination patient does have a mild left facial droop as well as left lower extremity weakness although no noted left upper extremity weakness although does state decreased sensation left arm face and leg.  Symptoms are concerning for CVA.  We will check labs, CT scan.  We will likely obtain MRI today as well.  Patient agreeable to plan of care.  NIH Stroke Scale   Interval: Baseline Time: 4:52 PM Person Administering Scale: Harvest Dark  Administer stroke scale items in the order listed. Record performance in each category after each subscale exam. Do not go back and change scores. Follow directions provided for each exam technique. Scores should reflect what the patient does, not what the  clinician thinks the patient can do. The clinician should record answers while administering the exam and work quickly. Except where indicated, the patient should not be coached (i.e., repeated requests to patient to make a special effort).   1a  Level of consciousness: 0=alert; keenly responsive  1b. LOC questions:  0=Performs both tasks correctly  1c. LOC commands: 0=Performs both tasks correctly  2.  Best Gaze: 0=normal  3.  Visual: 0=No visual loss  4. Facial Palsy: 1=Minor paralysis (flattened nasolabial fold, asymmetric on smiling)  5a.  Motor left arm: 0=No drift, limb holds 90 (or 45) degrees for full 10 seconds  5b.  Motor right arm: 0=No drift, limb holds 90 (or 45) degrees for full 10 seconds  6a. motor left leg: 2=Some effort against gravity, limb cannot get to or maintain (if cured) 90 (or 45) degrees, drifts down to bed, but has some effort against gravity  6b  Motor right leg:  0=No drift, limb holds 90 (or 45) degrees for full 10 seconds  7. Limb Ataxia: 0=Absent  8.  Sensory: 1=Mild to moderate sensory loss; patient feels pinprick is less sharp or is dull on the affected side; there is a loss of superficial pain with pinprick but patient is aware She is being touched  9. Best Language:  0=No aphasia, normal  10. Dysarthria: 0=Normal  11. Extinction and Inattention: 0=No abnormality  12. Distal motor function: 0=Normal   Total:   4    Patient is EBC shows largely normal results.  Chemistry is normal.  INR is normal.  CT scan of the head does not appear to show any bleed.  MRI does not appear to show any acute infarct.  However given the patient's ongoing symptoms and continues to have a left facial droop with left leg weakness we will admit to the hospital service for concern for other neurologic condition, TIA and neurology consultation.  Patient agreeable to plan of care.  I spoke to the hospitalist will be admitting to their service.  FINAL CLINICAL IMPRESSION(S) / ED  DIAGNOSES   Weakness Neurologic deficit  Note:  This document was prepared using Dragon voice recognition software and may include unintentional dictation errors.   Harvest Dark, MD 02/08/22 2317

## 2022-02-08 NOTE — Assessment & Plan Note (Addendum)
With multiple other neurologic symptoms including left facial droop, left upper and lower extremity numbness and at times weakness, dizziness, posterior headaches, ataxia. Admitted for evaluation of possible stroke or TIA.  CT head was nonacute.  No stroke on MRI.  Normal vitamin B12 and magnesium levels. EEG normal. MRI brain with without contrast including internal auditory canals also unrevealing. CTA head/neck unrevealing. LP done today 02/11/22 and results pending   Neurology following  Further evaluation pending results and clinical course, appreciate neurology input - no unifying diagnosis thus far, LP done 02/11/22 and full results pending - see neurology notes for full details

## 2022-02-08 NOTE — Hospital Course (Addendum)
Ms. Icel Castles is a 81 year old female with history of hyperlipidemia, GERD, anxiety, history of acute left otitis media (01/19/22), vertigo, who presents emergency department for chief concerns of left-sided weakness, left-sided facial droop upon waking up in the morning.  Initial vitals in the ED showed temperature 98.1, respiration rate of 18, heart rate 76, blood pressure 191/92, SPO2 100% on room air.  Serum sodium 141, potassium 3.9, chloride 114, bicarb 23, BUN of 14, serum creatinine 0.76, GFR greater than 60, nonfasting blood glucose 101, WBC 8.1, hemoglobin 11.8, platelets of 229.  ED treatment: Ativan 0.5 mg IV one-time dose

## 2022-02-08 NOTE — ED Notes (Signed)
Pt placed on Purewick with success. Pt complains of headache and not drinking much today.

## 2022-02-08 NOTE — ED Notes (Signed)
Patient transported to CT 

## 2022-02-08 NOTE — ED Triage Notes (Addendum)
Patient reports waking up this AM with left sided numbness and weakness from head to toe. Also reports headache. Last known well last night before bed. Patient states she has been having dizziness and prescribed meclizine recently and scheduled for MRI this week.

## 2022-02-08 NOTE — Assessment & Plan Note (Signed)
Continue PPI ?

## 2022-02-09 ENCOUNTER — Ambulatory Visit: Payer: Medicare HMO

## 2022-02-09 ENCOUNTER — Observation Stay: Payer: Medicare HMO

## 2022-02-09 ENCOUNTER — Ambulatory Visit: Payer: Medicare HMO | Admitting: Urology

## 2022-02-09 DIAGNOSIS — G629 Polyneuropathy, unspecified: Secondary | ICD-10-CM | POA: Diagnosis present

## 2022-02-09 DIAGNOSIS — Z79899 Other long term (current) drug therapy: Secondary | ICD-10-CM | POA: Diagnosis not present

## 2022-02-09 DIAGNOSIS — D509 Iron deficiency anemia, unspecified: Secondary | ICD-10-CM | POA: Diagnosis present

## 2022-02-09 DIAGNOSIS — K219 Gastro-esophageal reflux disease without esophagitis: Secondary | ICD-10-CM | POA: Diagnosis present

## 2022-02-09 DIAGNOSIS — R519 Headache, unspecified: Secondary | ICD-10-CM

## 2022-02-09 DIAGNOSIS — R296 Repeated falls: Secondary | ICD-10-CM

## 2022-02-09 DIAGNOSIS — R200 Anesthesia of skin: Secondary | ICD-10-CM

## 2022-02-09 DIAGNOSIS — R51 Headache with orthostatic component, not elsewhere classified: Secondary | ICD-10-CM

## 2022-02-09 DIAGNOSIS — F419 Anxiety disorder, unspecified: Secondary | ICD-10-CM | POA: Diagnosis present

## 2022-02-09 DIAGNOSIS — E538 Deficiency of other specified B group vitamins: Secondary | ICD-10-CM | POA: Diagnosis present

## 2022-02-09 DIAGNOSIS — R2990 Unspecified symptoms and signs involving the nervous system: Secondary | ICD-10-CM | POA: Diagnosis not present

## 2022-02-09 DIAGNOSIS — R42 Dizziness and giddiness: Secondary | ICD-10-CM | POA: Diagnosis not present

## 2022-02-09 DIAGNOSIS — Z885 Allergy status to narcotic agent status: Secondary | ICD-10-CM | POA: Diagnosis not present

## 2022-02-09 DIAGNOSIS — C9201 Acute myeloblastic leukemia, in remission: Secondary | ICD-10-CM

## 2022-02-09 DIAGNOSIS — R531 Weakness: Secondary | ICD-10-CM | POA: Diagnosis not present

## 2022-02-09 DIAGNOSIS — Z87891 Personal history of nicotine dependence: Secondary | ICD-10-CM | POA: Diagnosis not present

## 2022-02-09 DIAGNOSIS — H938X2 Other specified disorders of left ear: Secondary | ICD-10-CM

## 2022-02-09 DIAGNOSIS — N3941 Urge incontinence: Secondary | ICD-10-CM | POA: Diagnosis present

## 2022-02-09 DIAGNOSIS — R2681 Unsteadiness on feet: Secondary | ICD-10-CM

## 2022-02-09 DIAGNOSIS — Z9181 History of falling: Secondary | ICD-10-CM | POA: Diagnosis not present

## 2022-02-09 DIAGNOSIS — Z880 Allergy status to penicillin: Secondary | ICD-10-CM | POA: Diagnosis not present

## 2022-02-09 DIAGNOSIS — H9192 Unspecified hearing loss, left ear: Secondary | ICD-10-CM | POA: Diagnosis present

## 2022-02-09 DIAGNOSIS — K5900 Constipation, unspecified: Secondary | ICD-10-CM | POA: Diagnosis present

## 2022-02-09 DIAGNOSIS — N182 Chronic kidney disease, stage 2 (mild): Secondary | ICD-10-CM | POA: Diagnosis present

## 2022-02-09 DIAGNOSIS — R4189 Other symptoms and signs involving cognitive functions and awareness: Secondary | ICD-10-CM

## 2022-02-09 DIAGNOSIS — Z888 Allergy status to other drugs, medicaments and biological substances status: Secondary | ICD-10-CM | POA: Diagnosis not present

## 2022-02-09 DIAGNOSIS — D563 Thalassemia minor: Secondary | ICD-10-CM | POA: Diagnosis present

## 2022-02-09 DIAGNOSIS — E785 Hyperlipidemia, unspecified: Secondary | ICD-10-CM | POA: Diagnosis present

## 2022-02-09 DIAGNOSIS — R29810 Facial weakness: Secondary | ICD-10-CM

## 2022-02-09 DIAGNOSIS — G459 Transient cerebral ischemic attack, unspecified: Secondary | ICD-10-CM | POA: Diagnosis present

## 2022-02-09 LAB — LIPID PANEL
Cholesterol: 152 mg/dL (ref 0–200)
HDL: 42 mg/dL (ref >40)
LDL Cholesterol: 94 mg/dL (ref 0–99)
Total CHOL/HDL Ratio: 3.6 RATIO
Triglycerides: 80 mg/dL (ref <150)
VLDL: 16 mg/dL (ref 0–40)

## 2022-02-09 LAB — GLUCOSE, CAPILLARY
Glucose-Capillary: 94 mg/dL (ref 70–99)
Glucose-Capillary: 127 mg/dL — ABNORMAL HIGH (ref 70–99)

## 2022-02-09 LAB — URINE DRUG SCREEN, QUALITATIVE (ARMC ONLY)
Amphetamines, Ur Screen: NOT DETECTED
Barbiturates, Ur Screen: NOT DETECTED
Benzodiazepine, Ur Scrn: NOT DETECTED
Cannabinoid 50 Ng, Ur ~~LOC~~: NOT DETECTED
Cocaine Metabolite,Ur ~~LOC~~: NOT DETECTED
MDMA (Ecstasy)Ur Screen: NOT DETECTED
Methadone Scn, Ur: NOT DETECTED
Opiate, Ur Screen: NOT DETECTED
Phencyclidine (PCP) Ur S: NOT DETECTED
Tricyclic, Ur Screen: NOT DETECTED

## 2022-02-09 IMAGING — MR MR BRAIN/TEMPORAL BONE/IAC
10 of 14 series · 27 of 48 positions shown · IV contrast (gadavist)
Comparison: MRI of the brain [DATE].

CLINICAL DATA: Patient with progressive left sided weakness and
numbness, headache worse with valsalva, left facial droop,
dysarthria, foggy headedness, vertigo, falls.

EXAM:
MRI HEAD WITHOUT AND WITH CONTRAST
TECHNIQUE: Multiplanar, multiecho pulse sequences of the brain and surrounding
structures were obtained without and with intravenous contrast.
CONTRAST:  6mL GADAVIST GADOBUTROL 1 MMOL/ML IV SOLN

[Series 5: T1 · sagittal · 5.0mm · 0.62mm/px · 3 of 23 slices shown (1 of 3)]
[im 1/23]
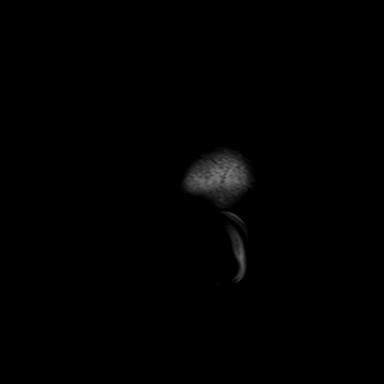
[im 12/23]
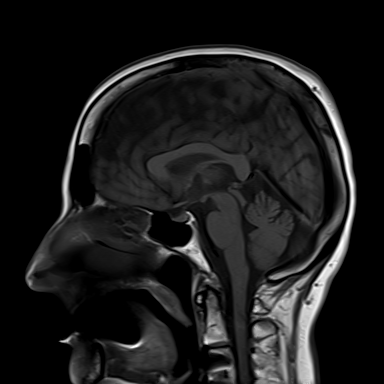
[im 23/23]
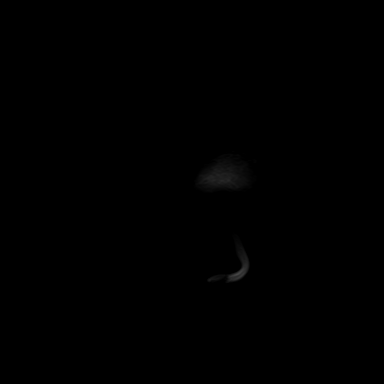

[Series 6: ax dwi_tracew · axial · 3.0mm · 0.60mm/px · z∈[-108,+47]mm · 3 of 48 slices shown]
[im 1/48]
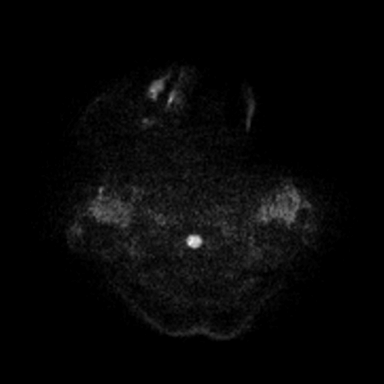
[im 24/48]
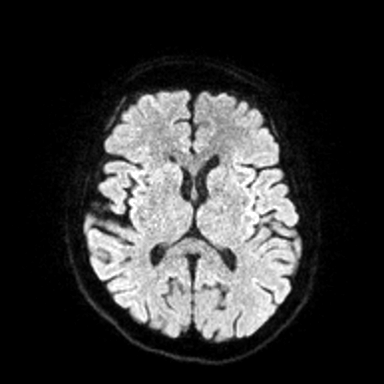
[im 48/48]
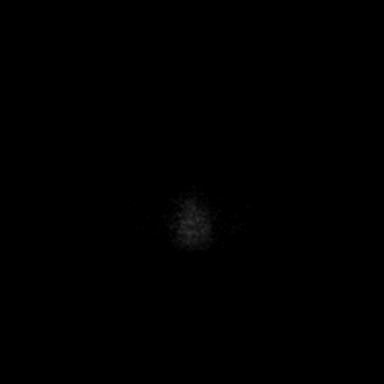

[Series 7: ax dwi_adc · axial · 3.0mm · 0.60mm/px · z∈[-108,-32]mm · 2 of 48 slices shown]
[im 1/48]
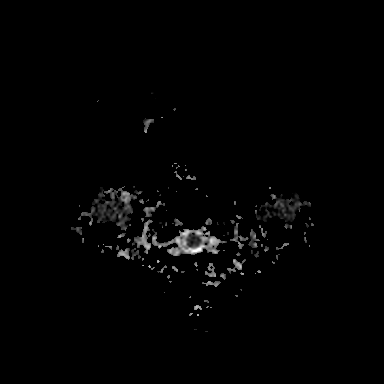
[im 24/48]
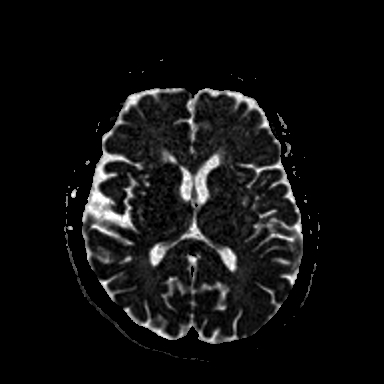

[Series 8: T2 · axial · 5.0mm · 0.53mm/px · z∈[-102,+41]mm · 2 of 25 slices shown]
[im 1/25]
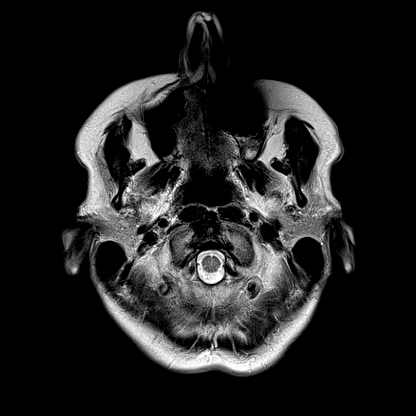
[im 25/25]
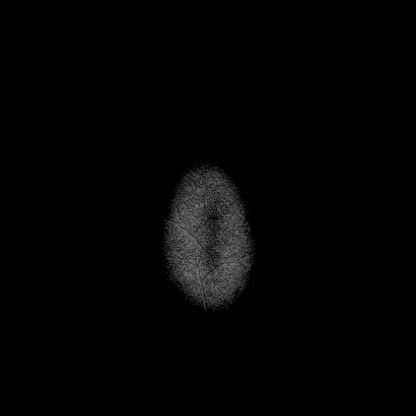

[Series 13: T1 · coronal · non-contrast · 3.0mm · 0.21mm/px · 1 of 13 slices shown (2 of 3)]
[im 1/13]
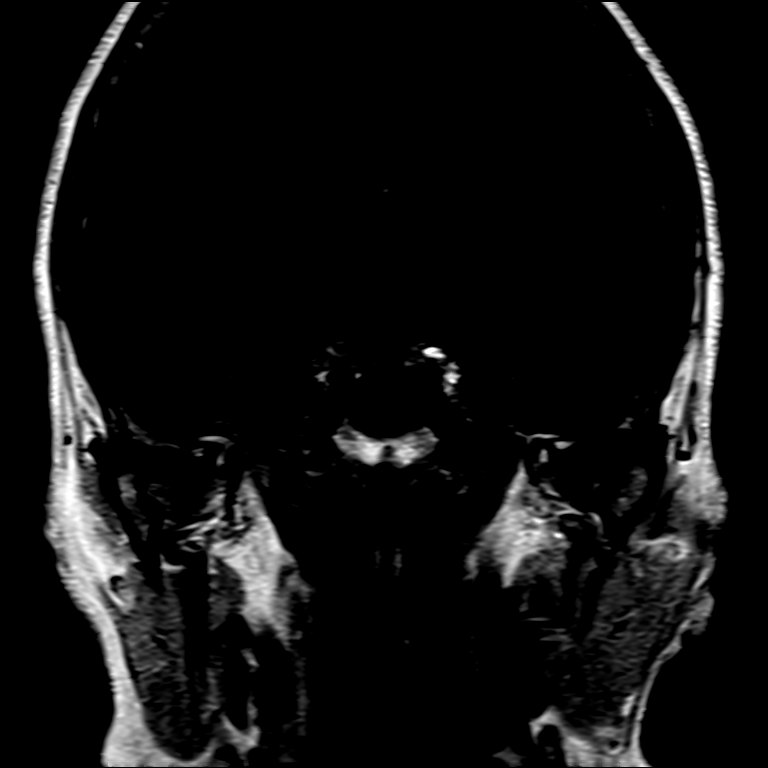

[Series 14: FLAIR · axial · 3.0mm · 0.53mm/px · z∈[-111,+50]mm · 4 of 55 slices shown]
[im 1/55]
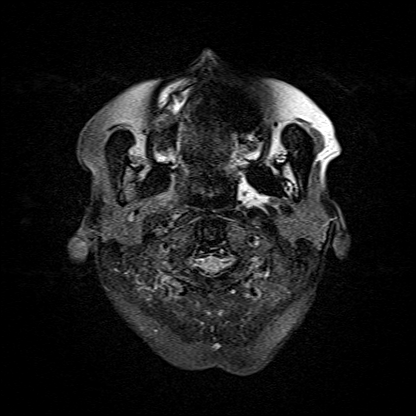
[im 19/55]
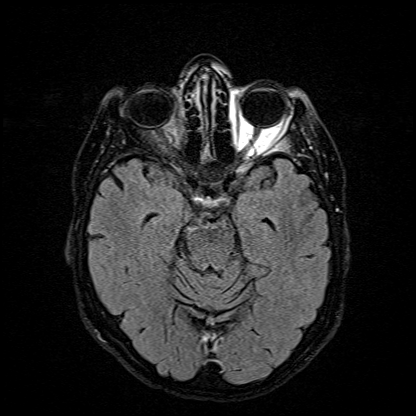
[im 37/55]
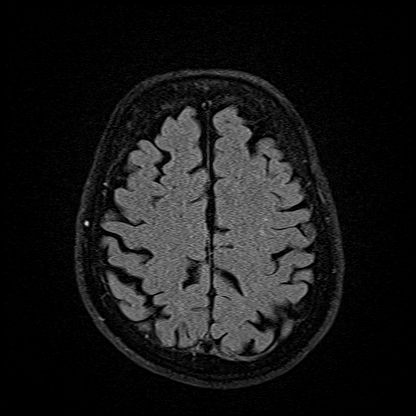
[im 55/55]
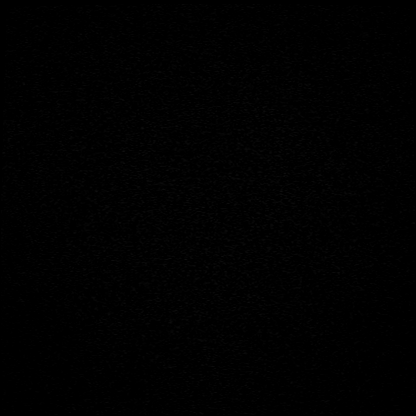

[Series 16: T1 · axial · non-contrast · 3.0mm · 0.21mm/px · 1 of 15 slices shown (3 of 3)]
[im 1/15]
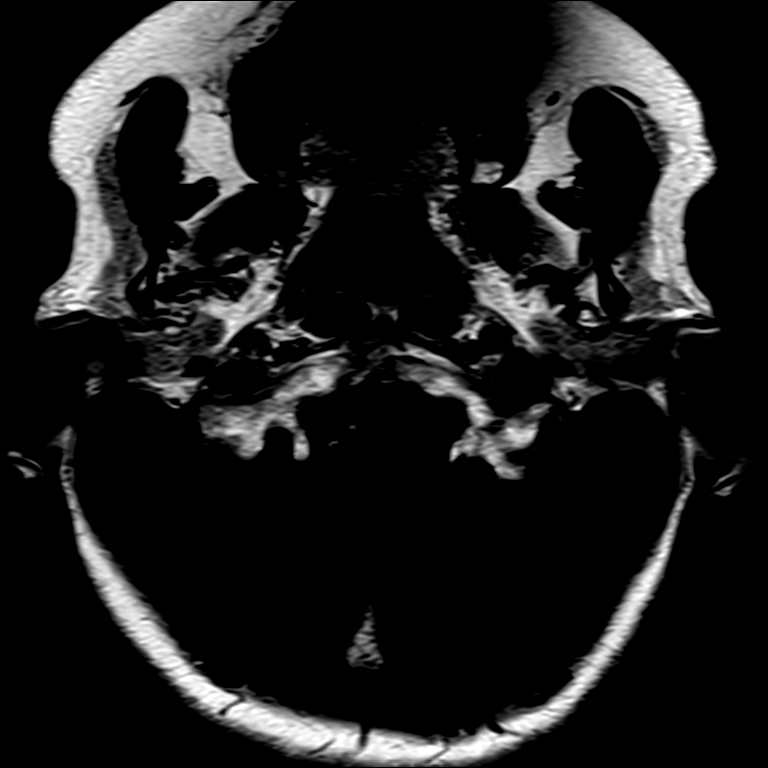

[Series 17: T1 post-contrast · axial · 3.0mm · 0.21mm/px · 1 of 15 slices shown (1 of 3)]
[im 1/15]
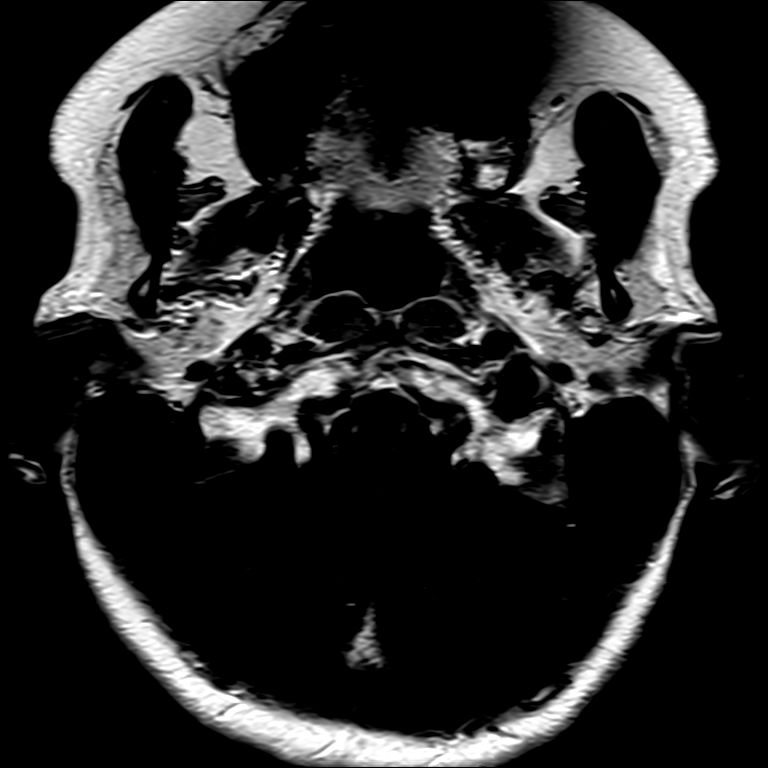

[Series 18: T1 post-contrast · coronal · 3.0mm · 0.21mm/px · 1 of 13 slices shown (2 of 3)]
[im 1/13]
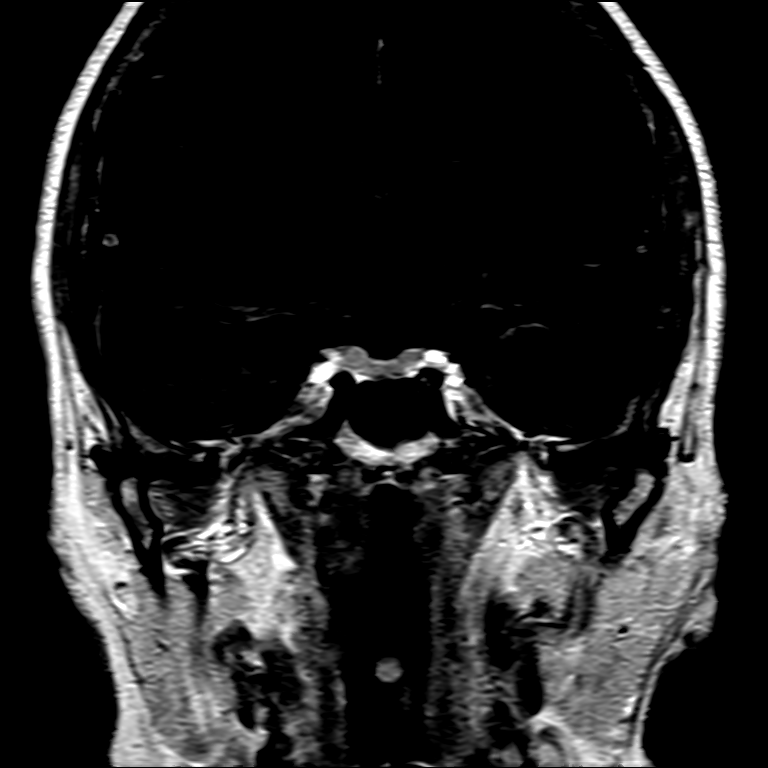

[Series 19: T1 post-contrast · axial · 1.0mm · 0.98mm/px · z∈[-119,+56]mm · 9 of 176 slices shown (3 of 3)]
[im 1/176]
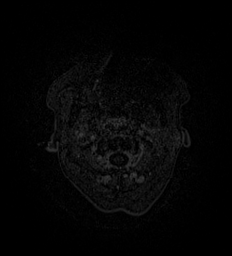
[im 30/176]
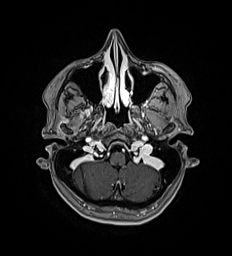
[im 59/176]
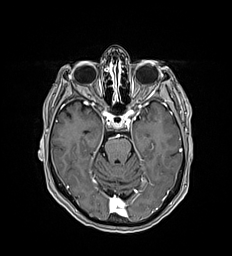
[im 73/176]
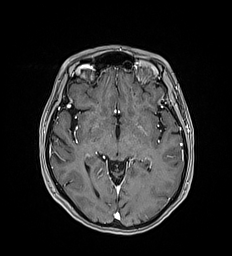
[im 88/176]
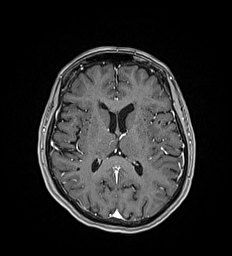
[im 103/176]
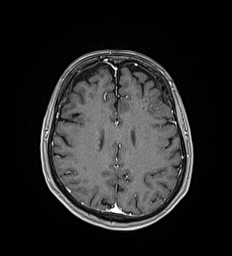
[im 117/176]
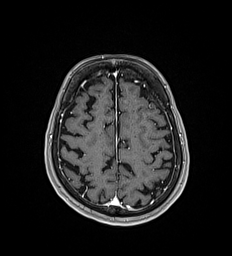
[im 146/176]
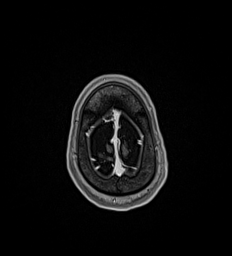
[im 176/176]
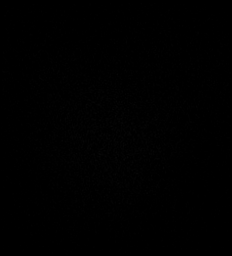

[27 of 48 positions shown; findings below may reference images not displayed]

FINDINGS: Brain: No acute infarction, hemorrhage, hydrocephalus, extra-axial
collection or mass lesion. Few scattered foci of T2 hyperintensity
within the white matter of the cerebral hemispheres, nonspecific,
most likely related to chronic small vessel ischemia. Mild
parenchymal volume. No focus of abnormal contrast enhancement
identified.

Dedicated imaging through the internal auditory canals demonstrates
a normal course of cranial nerves VII and VIII without evidence of a
mass or abnormal enhancement. The inner ear structures demonstrate
normal signal and morphology bilaterally. No mass is present in the
cerebellopontine angles.

Vascular: Normal flow voids.

Skull and upper cervical spine: No focal marrow lesion identified.

Sinuses/Orbits: Negative.

Other: None.
IMPRESSION: 1. No acute intracranial abnormality.
2. No evidence cerebellopontine angle or internal auditory canal
mass lesion or abnormal contrast enhancement.

## 2022-02-09 MED ORDER — GADOBUTROL 1 MMOL/ML IV SOLN
6.0000 mL | Freq: Once | INTRAVENOUS | Status: AC | PRN
  Administered 2022-02-09: 6 mL via INTRAVENOUS

## 2022-02-09 NOTE — Progress Notes (Signed)
IV site leaking so removed at this time. Spoke with patient and daughter and they refuse for another IV placement at this time and is willing to get one when necessary.

## 2022-02-09 NOTE — Assessment & Plan Note (Signed)
Hemoglobin on admission 11.8.  Monitor CBC.

## 2022-02-09 NOTE — Evaluation (Signed)
Physical Therapy Evaluation Patient Details Name: Roberta Curtis MRN: [id number] DOB: [birth year] Today's Date: 02/09/2022  History of Present Illness  Pt is a 81 year old female with history of hyperlipidemia, GERD, AML in remission, anxiety, history of acute left otitis media (01/19/22), vertigo, who presents emergency department for chief concerns of left-sided weakness and left-sided facial droop. MD assessment includes TIA, weakness, and left facial numbness.  Of note, no acute intracranial process per MRI impression. Pt diagnosed with vertigo and acute otitis media by outpatient PCP on 01/19/22.   Clinical Impression  Pt was pleasant and motivated to participate during the session and put forth good effort throughout. Pt reported no dizziness/vertigo in supine but did report dizziness in sitting and standing. Pt presented with weakness in her LUE and LLE compared to her R side per below but was able to amb 30 feet without instability with heavy cuing for step-to pattern to address her LLE weakness.  Sensation to light touch and proprioception grossly WNL. Pt required no physical assistance during the session and has 24/7 supervision at home upon discharge.  Pt will benefit from HHPT upon discharge to safely address deficits listed in patient problem list for decreased caregiver assistance and eventual return to PLOF.           Recommendations for follow up therapy are one component of a multi-disciplinary discharge planning process, led by the attending physician.  Recommendations may be updated based on patient status, additional functional criteria and insurance authorization.  Follow Up Recommendations Home health PT    Assistance Recommended at Discharge Frequent or constant Supervision/Assistance  Patient can return home with the following  A little help with walking and/or transfers;A little help with bathing/dressing/bathroom;Assistance with cooking/housework;Help with stairs or ramp  for entrance;Assist for transportation    Equipment Recommendations Rolling walker (2 wheels)  Recommendations for Other Services       Functional Status Assessment Patient has had a recent decline in their functional status and demonstrates the ability to make significant improvements in function in a reasonable and predictable amount of time.     Precautions / Restrictions Precautions Precautions: Fall Restrictions Weight Bearing Restrictions: No      Mobility  Bed Mobility Overal bed mobility: Modified Independent             General bed mobility comments: Extra timea and effort with bed mobility tasks    Transfers Overall transfer level: Needs assistance Equipment used: Rolling walker (2 wheels) Transfers: Sit to/from Stand Sit to Stand: Min guard           General transfer comment: Min verbal cues for hand placement    Ambulation/Gait Ambulation/Gait assistance: Min guard Gait Distance (Feet): 30 Feet Assistive device: Rolling walker (2 wheels) Gait Pattern/deviations: Step-to pattern Gait velocity: decreased     General Gait Details: Verbal and visual cues given during gait training with step-to pattern to address LLE weakness  Stairs            Wheelchair Mobility    Modified Rankin (Stroke Patients Only)       Balance Overall balance assessment: Needs assistance Sitting-balance support: No upper extremity supported, Feet supported Sitting balance-Leahy Scale: Good     Standing balance support: Bilateral upper extremity supported, During functional activity Standing balance-Leahy Scale: Fair                               Pertinent Vitals/Pain Pain Assessment Pain  Assessment: 0-10 Pain Score: 8  Pain Location: HA Pain Descriptors / Indicators: Headache Pain Intervention(s): Premedicated before session, Monitored during session, Repositioned    Home Living Family/patient expects to be discharged to:: Private  residence Living Arrangements: Spouse/significant other Available Help at Discharge: Family;Available 24 hours/day Type of Home: House Home Access: Stairs to enter Entrance Stairs-Rails: None Entrance Stairs-Number of Steps: threshold 1/2 step Alternate Level Stairs-Number of Steps: No need to go to the 2nd floor Home Layout: Two level;Able to live on main level with bedroom/bathroom Home Equipment: Cane - quad;Crutches;Shower seat      Prior Function Prior Level of Function : Independent/Modified Independent             Mobility Comments: Pt endorses furniture cruising when she first gets up otherwise Ind ambulation community distances without an AD, Psychologist, occupational at [facility], pt reported no fall history but daughter stated pt had one recent fall where she hit her head on a counter, MD notified ADLs Comments: Pt indep with ADL, does not drive     Hand Dominance   Dominant Hand: Right    Extremity/Trunk Assessment   Upper Extremity Assessment Upper Extremity Assessment: Defer to OT evaluation;Generalized weakness;LUE deficits/detail LUE Deficits / Details: LUE elbow flex and ext both grossly 3+/5 with RUE grossly WNL LUE Sensation: WNL    Lower Extremity Assessment Lower Extremity Assessment: LLE deficits/detail LLE Deficits / Details: LLE knee ext 2+/5, knee flex 3+/5, and ankle DF 3+/5; RLE strength grossly WNL LLE Sensation: WNL    Cervical / Trunk Assessment Cervical / Trunk Assessment: Normal  Communication   Communication: No difficulties  Cognition Arousal/Alertness: Awake/alert Behavior During Therapy: WFL for tasks assessed/performed Overall Cognitive Status: Within Functional Limits for tasks assessed                                          General Comments General comments (skin integrity, edema, etc.): Pt endorsed feeling lightheaded/dizzy upon sitting and standing, did not worsen with positional changes, but did not improve either.     Exercises     Assessment/Plan    PT Assessment Patient needs continued PT services  PT Problem List Decreased strength;Decreased activity tolerance;Decreased balance;Decreased mobility;Decreased knowledge of use of DME       PT Treatment Interventions DME instruction;Gait training;Stair training;Functional mobility training;Therapeutic activities;Therapeutic exercise;Balance training;Patient/family education    PT Goals (Current goals can be found in the Care Plan section)  Acute Rehab PT Goals Patient Stated Goal: To get back to volunteering PT Goal Formulation: With patient Time For Goal Achievement: 02/22/22 Potential to Achieve Goals: Good    Frequency Min 2X/week     Co-evaluation               AM-PAC PT "6 Clicks" Mobility  Outcome Measure Help needed turning from your back to your side while in a flat bed without using bedrails?: A Little Help needed moving from lying on your back to sitting on the side of a flat bed without using bedrails?: A Little Help needed moving to and from a bed to a chair (including a wheelchair)?: A Little Help needed standing up from a chair using your arms (e.g., wheelchair or bedside chair)?: A Little Help needed to walk in hospital room?: A Little Help needed climbing 3-5 steps with a railing? : A Lot 6 Click Score: 17    End of Session  Equipment Utilized During Treatment: Gait belt Activity Tolerance: Patient tolerated treatment well Patient left: in chair;with nursing/sitter in room;with family/visitor present;with call bell/phone within reach;with chair alarm set Nurse Communication: Mobility status PT Visit Diagnosis: Hemiplegia and hemiparesis;History of falling (Z91.81);Unsteadiness on feet (R26.81);Difficulty in walking, not elsewhere classified (R26.2);Muscle weakness (generalized) (M62.81) Hemiplegia - Right/Left: Left Hemiplegia - dominant/non-dominant: Non-dominant Hemiplegia - caused by: Unspecified    Time:  4481-8563 PT Time Calculation (min) (ACUTE ONLY): 33 min   Charges:   PT Evaluation $PT Eval Moderate Complexity: 1 Mod        D. Scott Nadalyn Deringer PT, DPT 02/09/22, 12:00 PM

## 2022-02-09 NOTE — Progress Notes (Signed)
   02/09/22 1615  Clinical Encounter Type  Visited With Patient and family together   Chaplain Burris made a quick f/u to let family know that notarization of their AD will have to be done on 6/6 and to help them know Spiritual Care is still working to fulfill their needs.

## 2022-02-09 NOTE — Progress Notes (Signed)
Progress Note   Patient: Roberta Curtis ZRA:[id number] DOB: Feb 20, 1941 DOA: 02/08/2022     0 DOS: the patient was seen and examined on 02/09/2022   Brief hospital course: Ms. Brynlei Klausner is a 81 year old female with history of hyperlipidemia, GERD, anxiety, history of acute left otitis media (01/19/22), vertigo, who presents emergency department for chief concerns of left-sided weakness, left-sided facial droop upon waking up in the morning.  They reported several month history on intermittent but progressive numbness/tingling on left side of face, left upper & lower extremity, facial droop on left with drooling.  She also has had positional dizziness and sensation of "fullness" in her head that is also positional and worse with valsalva (during BM's).  Usually feels better when laying down.  ED course -- vitals notable only for uncontrolled BP up to 191/92 initially.  Labs notable only for mild anemia, Cl 114, glucose 101.  Imaging --  CT Head without contrast: 1. Decreased attenuation within the mid brain, which may represent artifact, ischemia or less likely infarct. Consider MRI... 2. Small area of nonspecific subcortical white matter hypodensity in the RIGHT parietal region, age indeterminate. Consider MRI...  MRI brain without contrast: No acute intracranial process.  Patient was admitted with Neurology consulted for further evaluation and management.  Assessment and Plan: * Stroke-like symptoms Left facial numbness  Left facial numbness With multiple other neurologic symptoms including left facial droop, left upper and lower extremity numbness and at times weakness, dizziness, posterior headaches, ataxia. Admitted for evaluation of possible stroke or TIA.  CT head was nonacute.  No stroke on MRI.  Normal vitamin B12 and magnesium levels. -- Neurology following -- MRI brain with without contrast including internal auditory canals -- Further evaluation pending results and clinical  course -- PT, OT, SLP evaluations -- Follow-up pending echo  TIA (transient ischemic attack) Presented with progressive intermittent stroke-like symptoms over months.  See left facial numbness for A&P. Management per neurology.   Vertigo Patient does report positional dizziness and posterior headaches but does not describe room spinning.  Neurologic evaluation is underway as outlined. -- Meclizine as needed  Neuropathy Appears not to be on gabapentin or Lyrica  Hypochromic-microcytic anemia Hemoglobin on admission 11.8. Monitor CBC.  Weakness Normal vitamin B12 and Mg level. Chest x-ray on admission negative. Unclear etiology.  Suspect related to the underlying neurologic complaints. -- PT/OT evaluations -- Fall precautions  Urge incontinence Hold mirabegron  CKD (chronic kidney disease) stage 2, GFR 60-89 ml/min Renal function stable.  GERD (gastroesophageal reflux disease) Continue PPI  AML (acute myeloid leukemia) in remission ([place]) No acute issues. Outpatient follow-up as appropriate.        Subjective: Patient was awake resting in bed with daughter at bedside when seen on rounds.  We discussed her course of progressive symptoms and neurology's recommended evaluation.  Patient otherwise reports feeling okay at this time.  Denies acute complaints.  Physical Exam: Vitals:   02/09/22 0421 02/09/22 0725 02/09/22 1135 02/09/22 1529  BP: (!) 101/49 (!) 126/52 (!) 157/60 133/89  Pulse: 66 62 66 (!) 59  Resp: '17 20  18  '$ Temp: 97.7 F (36.5 C) 97.9 F (36.6 C) 98 F (36.7 C) 98.2 F (36.8 C)  TempSrc:  Oral    SpO2: 97% 96% 98% 100%  Weight:      Height:       General exam: awake, alert, no acute distress HEENT: Clear conjunctiva, moist mucus membranes, hearing grossly normal  Respiratory system: CTAB, no  wheezes, rales or rhonchi, normal respiratory effort. Cardiovascular system: normal S1/S2, RRR, no pedal edema.   Central nervous system: A&O x3.   Chronic right facial droop, also with left facial droop reportedly new, mild dysarthric speech, gait not tested Extremities: moves all, no edema, normal tone Skin: dry, intact, normal temperature Psychiatry: normal mood, congruent affect, judgement and insight appear normal   Data Reviewed:  Notable labs: Lipid panel normal.  B12 level normal.  Magnesium level 1.9 normal.  Family Communication: Daughter at bedside on rounds  Disposition: Status is: Inpatient Remains inpatient appropriate because: Ongoing neurologic evaluation not appropriate for the outpatient setting.  Patient also with persistent neurologic deficits     Planned Discharge Destination: Home    Time spent: 45 minutes  Author: Ezekiel Slocumb, DO 02/09/2022 3:39 PM  For on call review [email or web address].

## 2022-02-09 NOTE — TOC Initial Note (Signed)
Transition of Care [facility]) - Initial/Assessment Note    Patient Details  Name: Roberta Curtis MRN: [id number] Date of Birth: 08-Jan-1941  Transition of [facility]) CM/SW Contact:    Pete Pelt, RN Phone Number: 02/09/2022, 2:19 PM  Clinical Narrative:  Patient lives at home with her husband, who can assist with care and medications at home.  Daughter will also provide supportive care and transportation to appointments.  Patient and daughter both agree that they are comfortable with patient returning home on discharge  [facility] home health with provide home health, patient and family aware, [facility] to deliver 2 wheel walker to room prior to discharge.  Patient does not currently have a walker and is amenable to plan.  Patient and family have no other needs at this time, TOC contact information provided.              Expected Discharge Plan: [place] Barriers to Discharge: Continued Medical Work up   Patient Goals and CMS Choice        Expected Discharge Plan and Services Expected Discharge Plan: [place]   Discharge Planning Services: CM Consult Post Acute Care Choice: Home Health, Durable Medical Equipment Living arrangements for the past 2 months: Single Family Home                 DME Arranged: Walker rolling DME Agency: [facility] Date DME Agency Contacted: 02/09/22 Time DME Agency Contacted: [phone] Representative spoke with at DME Agency: Suanne Marker HH Arranged: PT [place]: [place] Date [place]: 02/09/22 Time [place]: 88 Representative spoke with at [place]: Sharmon Revere  Prior Living Arrangements/Services Living arrangements for the past 2 months: [place] with:: Self, Spouse Patient language and need for interpreter reviewed:: Yes Do you feel safe going back to the place where you live?: Yes      Need for Family Participation in Patient Care: Yes (Comment) Care  giver support system in place?: Yes (comment)   Criminal Activity/Legal Involvement Pertinent to Current Situation/Hospitalization: No - Comment as needed  Activities of Daily Living      Permission Sought/Granted Permission sought to share information with : Case Manager       Permission granted to share info w AGENCY: [facility] and [facility]        Emotional Assessment Appearance:: Appears stated age Attitude/Demeanor/Rapport: Gracious, Engaged Affect (typically observed): Pleasant, Appropriate Orientation: : Oriented to Self, Oriented to Place, Oriented to  Time Alcohol / Substance Use: Not Applicable Psych Involvement: No (comment)  Admission diagnosis:  TIA (transient ischemic attack) [G45.9] Patient Active Problem List   Diagnosis Date Noted   TIA (transient ischemic attack) 02/08/2022   Left facial numbness 02/08/2022   Weakness 02/08/2022   Hypochromic-microcytic anemia 02/08/2022   Acute left otitis media 01/19/2022   Dizziness 01/08/2022   Urinary incontinence 01/08/2022   Pneumonia of both lungs due to infectious organism 01/08/2022   Vertigo 01/08/2022   Pleural effusion 01/08/2022   Aortic 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[place]) 12/22/2021   Neuropathy 11/05/2021   Candidiasis 11/05/2021   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   Urge incontinence 09/05/2020   Burn 09/05/2020   Chronic nausea 08/13/2020   Chronic bilateral low back pain without sciatica 16/09/930   Mild diastolic dysfunction 35/57/3220   AML (acute myeloid leukemia) in remission ([place]) 02/12/2020   S/P bone marrow transplant ([place]) 02/12/2020   Osteopenia 02/12/2020   GERD (gastroesophageal reflux disease) 02/12/2020  Mitral valve regurgitation 02/12/2020   Prediabetes 02/12/2020   CKD (chronic kidney disease) stage 2, 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   PCP:  Lesleigh Noe, MD Pharmacy:   [id number] [place]Phone: [phone] Fax: [phone]    Social Determinants of Health (SDOH) Interventions    Readmission Risk Interventions     View : No data to display.

## 2022-02-09 NOTE — Progress Notes (Signed)
Eeg done 

## 2022-02-09 NOTE — Evaluation (Signed)
Occupational Therapy Evaluation Patient Details Name: Roberta Curtis MRN: [id number] DOB: 12-12-1940 Today's Date: 02/09/2022   History of Present Illness 81 year old female with history of hyperlipidemia, GERD, anxiety, history of acute left otitis media (01/19/22), vertigo, who presents emergency department for chief concerns of left-sided weakness, left-sided facial droop upon waking up in the morning.   Clinical Impression   Pt was seen for OT evaluation this date. Prior to hospital admission, pt was generally independent with ADL and volunteering at [facility] until recently. She lives with her spouse in a 2 story condo but does not go upstairs. Currently pt demonstrates impairments in balance, headache, and decreased sensation to L side of face with slight facial droop as described below (See OT problem list) which functionally limit her ability to perform ADL/self-care tasks at baseline independence. Pt currently requires CGA for ADL transfers, set up and supv for initiating LB dressing from seated position, and intermittent UE support with CGA for completing LB dressing in standing over hips. Pt educated in falls prevention, strategies to improve balance during ADL/mobility. Pt verbalized understanding. Pt endorsed having headache progressing as she sat on the EOB, also reports L side of face sensation worsening and the L side of her face and L ear feeling "full up" the longer she was sitting. Dizziness upon sitting that did not improve or worsen with positional changes. RN notified. Pt would benefit from skilled OT services while hospitalized to address noted impairments and functional limitations (see below for any additional details) in order to maximize safety and independence while minimizing falls risk and caregiver burden.    Recommendations for follow up therapy are one component of a multi-disciplinary discharge planning process, led by the attending physician.  Recommendations may be updated  based on patient status, additional functional criteria and insurance authorization.   Follow Up Recommendations  No OT follow up    Assistance Recommended at Discharge Intermittent Supervision/Assistance  Patient can return home with the following A little help with walking and/or transfers;A little help with bathing/dressing/bathroom;Assistance with cooking/housework;Assist for transportation;Help with stairs or ramp for entrance    Functional Status Assessment  Patient has had a recent decline in their functional status and demonstrates the ability to make significant improvements in function in a reasonable and predictable amount of time.  Equipment Recommendations  None recommended by OT    Recommendations for Other Services       Precautions / Restrictions Precautions Precautions: Fall Restrictions Weight Bearing Restrictions: No      Mobility Bed Mobility Overal bed mobility: Needs Assistance Bed Mobility: Supine to Sit, Sit to Supine     Supine to sit: Modified independent (Device/Increase time) Sit to supine: Modified independent (Device/Increase time)        Transfers Overall transfer level: Needs assistance Equipment used: Rolling walker (2 wheels), None Transfers: Sit to/from Stand Sit to Stand: Min guard           General transfer comment: required 2-3 initial attempts before fulling achieving standing      Balance Overall balance assessment: Needs assistance Sitting-balance support: No upper extremity supported, Feet supported Sitting balance-Leahy Scale: Fair     Standing balance support: Single extremity supported, No upper extremity supported, During functional activity   Standing balance comment: fair static, poor dynamic requiring intermittent support                           ADL either performed or assessed with clinical  judgement   ADL Overall ADL's : Needs assistance/impaired                                        General ADL Comments: Pt required CGA for ADL transfers and LB ADL requiring transfers, Set up and supv for initiating LB dressing using figure 4 technique in sitting.     Vision         Perception     Praxis      Pertinent Vitals/Pain Pain Assessment Pain Assessment: 0-10 Pain Score: 5  Pain Location: headache that gradually comes on while pt sitting, reports her L side of face/ear feels "full up" Pain Descriptors / Indicators: Headache Pain Intervention(s): Limited activity within patient's tolerance, Monitored during session, Repositioned     Hand Dominance Right   Extremity/Trunk Assessment Upper Extremity Assessment Upper Extremity Assessment: Overall WFL for tasks assessed;Generalized weakness (no sensory deficits, no unilateral coordination deficits)   Lower Extremity Assessment Lower Extremity Assessment: Overall WFL for tasks assessed;Generalized weakness (pt has baseline neuropathy but otherwise no sensory deficits, unilateral coordination deficits)   Cervical / Trunk Assessment Cervical / Trunk Assessment: Normal   Communication Communication Communication: Other (comment) (slight slurring)   Cognition Arousal/Alertness: Awake/alert Behavior During Therapy: WFL for tasks assessed/performed Overall Cognitive Status: Within Functional Limits for tasks assessed                                       General Comments  Pt endorsed feeling lightheaded/dizzy upon sitting and standing, did not worsen with positional changes, but did not improve either.    Exercises Other Exercises Other Exercises: Pt educated in falls prevention, strategies to improve balance during ADL/mobility   Shoulder Instructions      Home Living Family/patient expects to be discharged to:: Private residence Living Arrangements: Spouse/significant other Available Help at Discharge: Family;Available 24 hours/day Type of Home: House (condo) Home Access: Stairs to  enter [employer] of Steps: threshold 1/2 step   Home Layout: Two level;Able to live on main level with bedroom/bathroom Alternate Level Stairs-Number of Steps: doesn't go upstairs   Bathroom Shower/Tub: Tub/shower unit;Walk-in Psychologist, prison and probation services: Standard     Home Equipment: Cane - quad;Crutches;Shower seat          Prior Functioning/Environment Prior Level of Function : Independent/Modified Independent             Mobility Comments: Pt endorses furniture cruising when she first gets up ADLs Comments: Pt indep with ADL, does not drive, volunteers at [facility], denies falls        OT Problem List: Impaired sensation;Pain;Decreased knowledge of use of DME or AE;Impaired balance (sitting and/or standing)      OT Treatment/Interventions: Self-care/ADL training;Therapeutic exercise;Therapeutic activities;DME and/or AE instruction;Patient/family education;Balance training    OT Goals(Current goals can be found in the care plan section) Acute Rehab OT Goals Patient Stated Goal: get better and be able to volunteer again OT Goal Formulation: With patient Time For Goal Achievement: 02/23/22 Potential to Achieve Goals: Good ADL Goals Pt Will Perform Lower Body Dressing: with modified independence Pt Will Transfer to Toilet: with modified independence Pt Will Perform Toileting - Clothing Manipulation and hygiene: with modified independence Additional ADL Goal #1: Pt will complete all aspects of bathing primarily from seated position with modified independence.  OT Frequency: Min 2X/week    Co-evaluation              AM-PAC OT "6 Clicks" Daily Activity     Outcome Measure Help from another person eating meals?: None Help from another person taking care of personal grooming?: None Help from another person toileting, which includes using toliet, bedpan, or urinal?: A Little Help from another person bathing (including washing, rinsing, drying)?: A Little Help  from another person to put on and taking off regular upper body clothing?: None Help from another person to put on and taking off regular lower body clothing?: A Little 6 Click Score: 21   End of Session Equipment Utilized During Treatment: Rolling walker (2 wheels)  Activity Tolerance: Patient limited by pain Patient left: in bed;with call bell/phone within reach;with bed alarm set  OT Visit Diagnosis: Other abnormalities of gait and mobility (R26.89);Dizziness and giddiness (R42)                Time: 5681-2751 OT Time Calculation (min): 32 min Charges:  OT General Charges $OT Visit: 1 Visit OT Evaluation $OT Eval Low Complexity: 1 Low OT Treatments $Self Care/Home Management : 8-22 mins  Ardeth Perfect., MPH, MS, OTR/L ascom [phone] 02/09/22, 10:17 AM

## 2022-02-09 NOTE — Assessment & Plan Note (Addendum)
Renal function stable.

## 2022-02-09 NOTE — Progress Notes (Signed)
   02/09/22 1400  Clinical Encounter Type  Visited With Family  Visit Type Follow-up   Chaplain arrived with notary and two witnesses to sign Advance Directive. Patient not available as scheduled as she was taken for MRI. Referral given to on-call chaplain to complete AD.

## 2022-02-09 NOTE — Assessment & Plan Note (Deleted)
Left facial numbness

## 2022-02-09 NOTE — Assessment & Plan Note (Addendum)
   Manage per neurology pending workup for other symptoms   Iron panel normal  Supplementing B12

## 2022-02-09 NOTE — Progress Notes (Signed)
   02/09/22 1200  Clinical Encounter Type  Visited With Patient and family together  Visit Type Initial  Referral From Patient  Consult/Referral To Chaplain   Chaplain responded to request to complete Advance Directive. Notary scheduled for 1330.

## 2022-02-09 NOTE — Assessment & Plan Note (Signed)
Patient does report positional dizziness and posterior headaches but does not describe room spinning.  Neurologic evaluation is underway as outlined.  Meclizine as needed

## 2022-02-10 ENCOUNTER — Inpatient Hospital Stay: Payer: Medicare HMO

## 2022-02-10 DIAGNOSIS — R202 Paresthesia of skin: Secondary | ICD-10-CM

## 2022-02-10 LAB — FOLATE: Folate: 13.8 ng/mL (ref >5.9)

## 2022-02-10 IMAGING — MR MR CERVICAL SPINE W/O CM
5 series · 39 of 48 positions shown · non-contrast
Comparison: CTA head and neck from same day.

CLINICAL DATA: Left-sided weakness and numbness. History of prior
cervical spine trauma and fusion.

EXAM:
MRI CERVICAL SPINE WITHOUT CONTRAST
TECHNIQUE: Multiplanar, multisequence MR imaging of the cervical spine was
performed. No intravenous contrast was administered.

[Series 5: T2 · sagittal · 3.0mm · 0.62mm/px · 6 of 15 slices shown (1 of 2)]
[im 1/15]
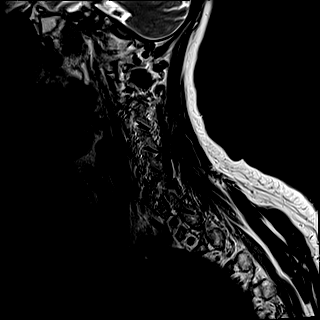
[im 3/15]
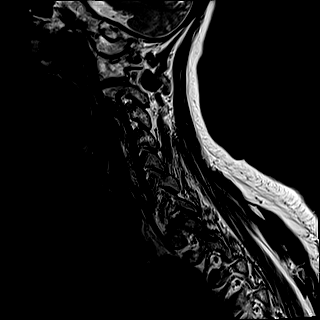
[im 6/15]
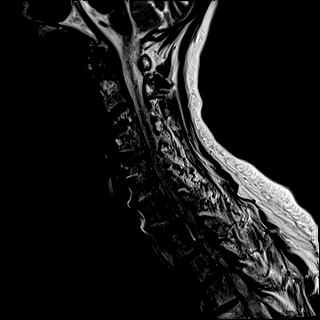
[im 9/15]
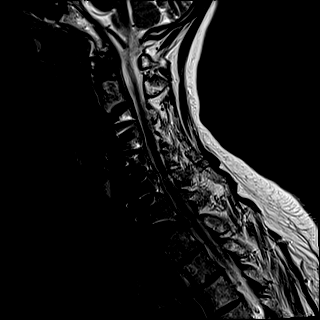
[im 12/15]
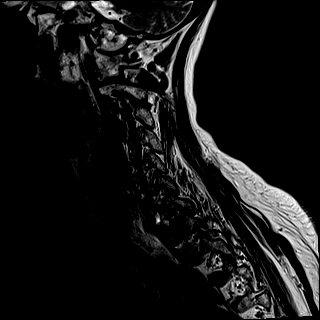
[im 15/15]
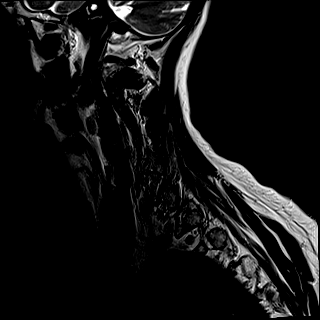

[Series 6: FLAIR · sagittal · 3.0mm · 0.78mm/px · 7 of 15 slices shown]
[im 1/15]
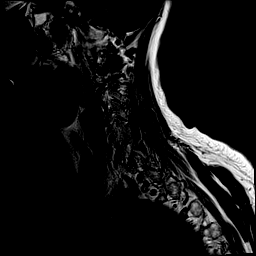
[im 3/15]
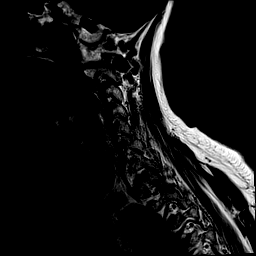
[im 5/15]
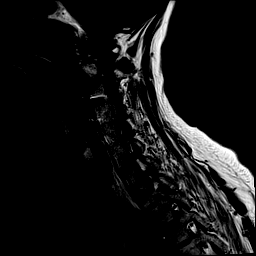
[im 8/15]
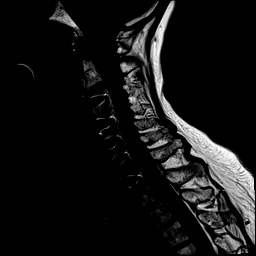
[im 10/15]
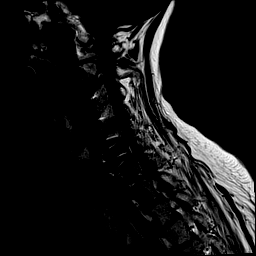
[im 12/15]
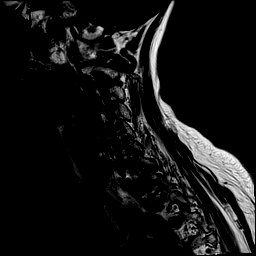
[im 15/15]
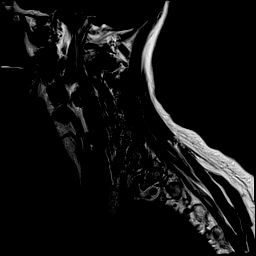

[Series 7: STIR · sagittal · 3.0mm · 0.62mm/px · 7 of 15 slices shown]
[im 1/15]
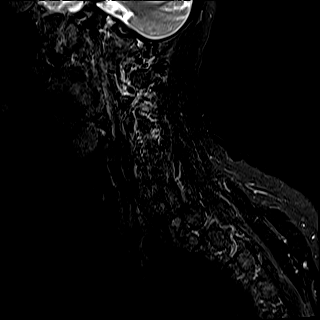
[im 3/15]
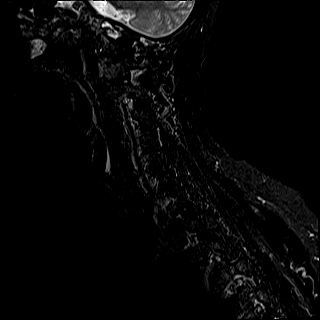
[im 5/15]
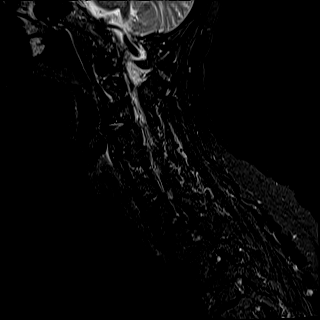
[im 8/15]
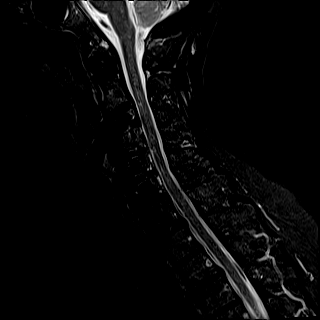
[im 10/15]
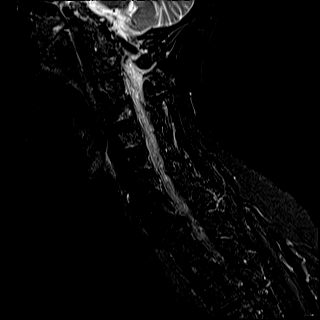
[im 12/15]
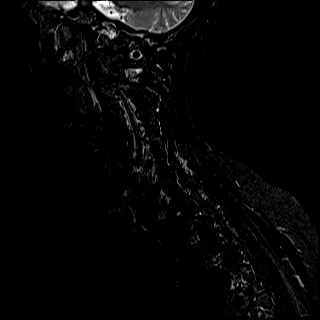
[im 15/15]
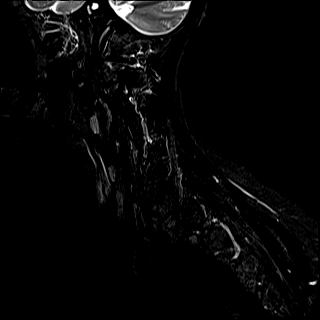

[Series 8: T2 · axial · 3.0mm · 0.70mm/px · z∈[-112,-13]mm · 11 of 31 slices shown (2 of 2)]
[im 1/31]
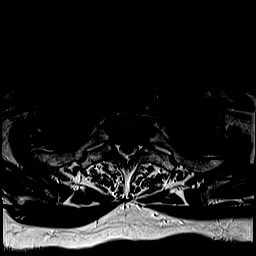
[im 3/31]
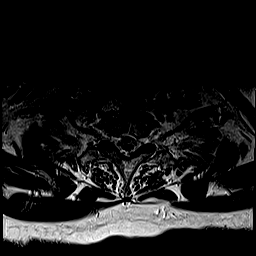
[im 5/31]
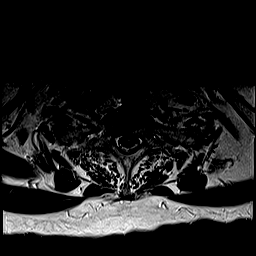
[im 7/31]
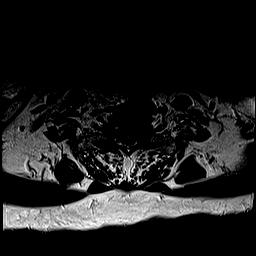
[im 10/31]
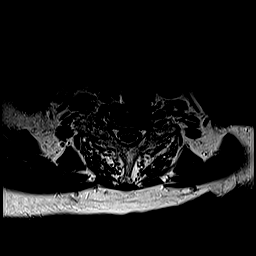
[im 12/31]
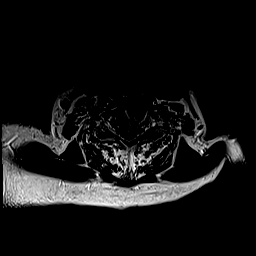
[im 14/31]
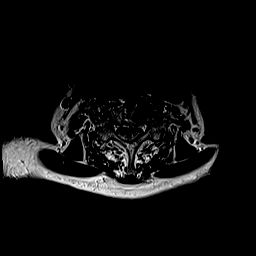
[im 17/31]
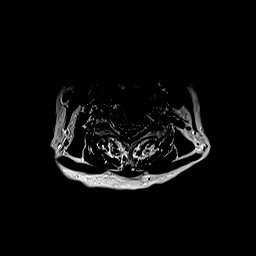
[im 21/31]
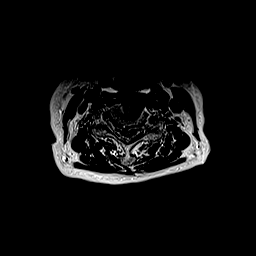
[im 26/31]
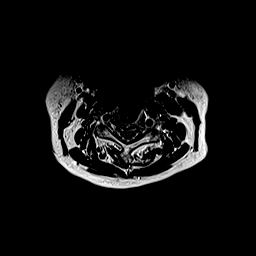
[im 31/31]
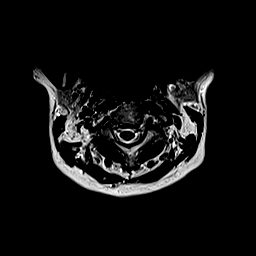

[Series 9: ax mpgr · axial · 3.0mm · 0.35mm/px · z∈[-112,-13]mm · 8 of 31 slices shown]
[im 1/31]
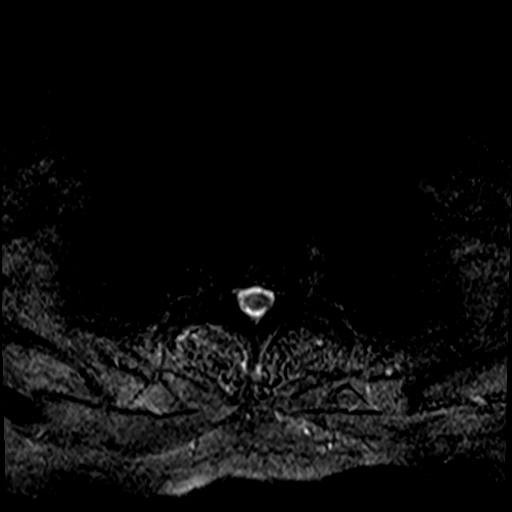
[im 5/31]
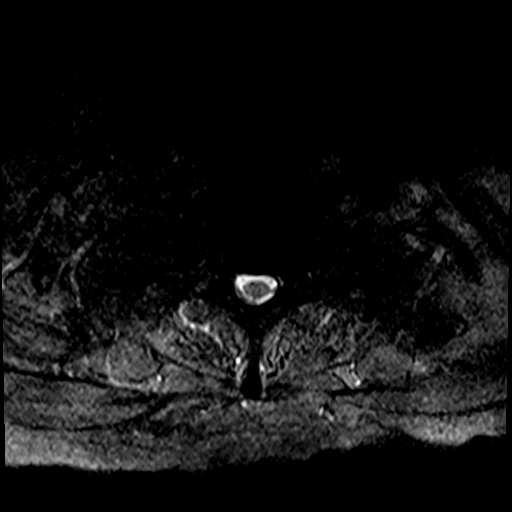
[im 10/31]
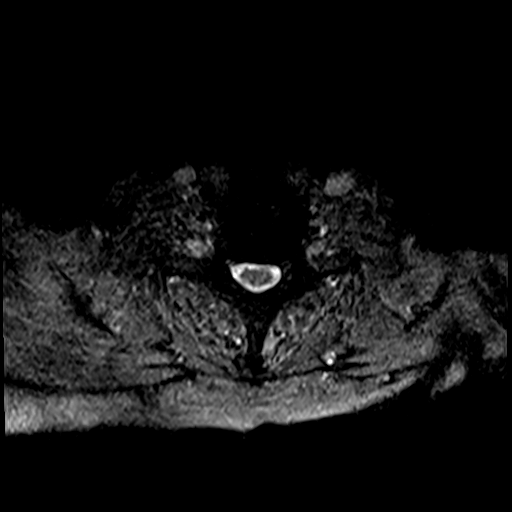
[im 14/31]
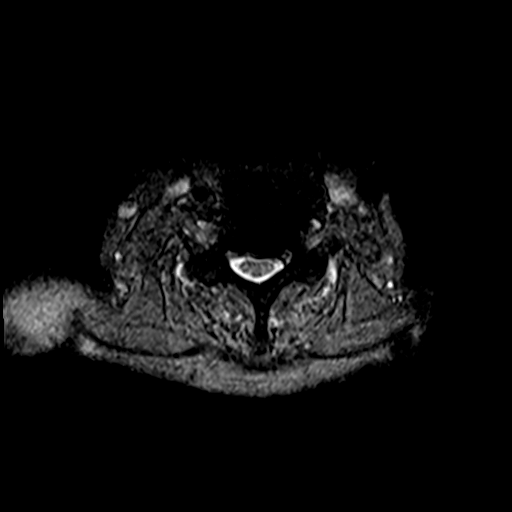
[im 17/31]
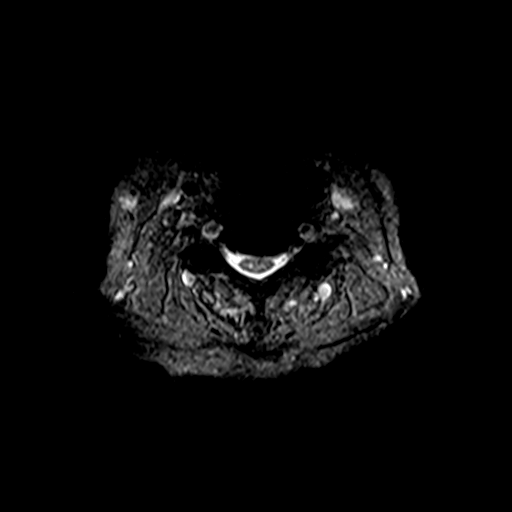
[im 21/31]
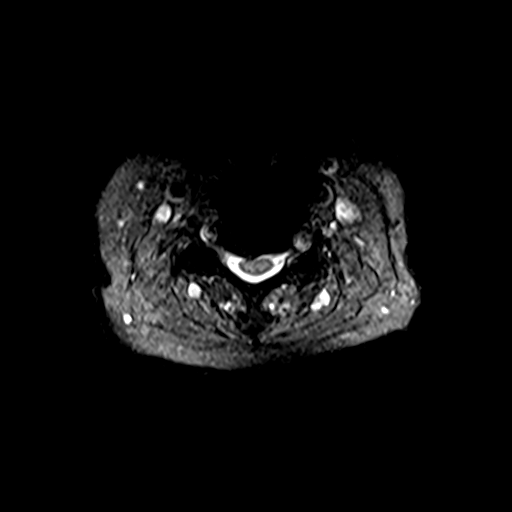
[im 26/31]
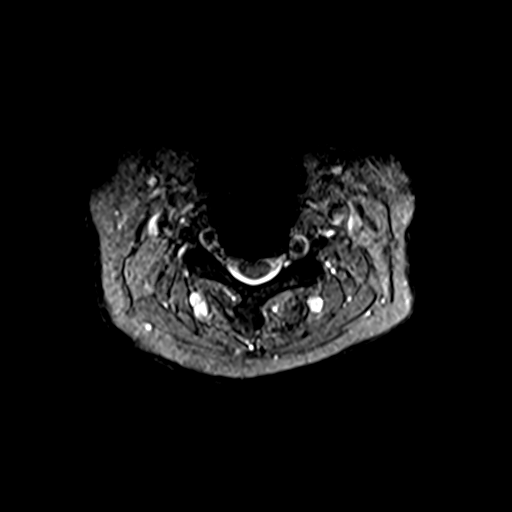
[im 31/31]
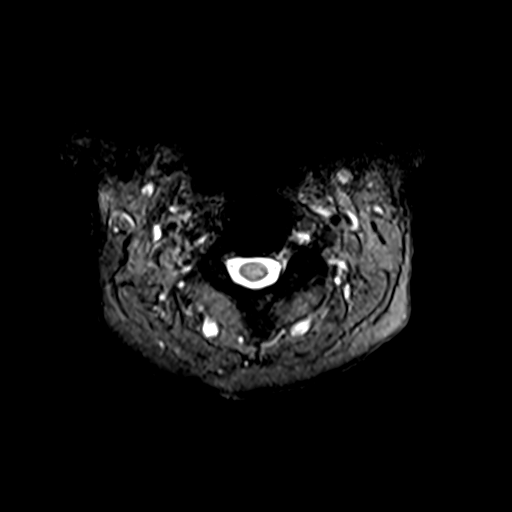

[39 of 48 positions shown; findings below may reference images not displayed]

FINDINGS: Alignment: Physiologic.

Vertebrae: No fracture, evidence of discitis, or bone lesion. Prior
C3-C7 ACDF.

Cord: Small foci increased T2 signal within the spinal cord at C4-C5
and C5-C6 likely represent myelomalacia given mild cord atrophy in
this region.

Posterior Fossa, vertebral arteries, paraspinal tissues: Negative.

Disc levels:

C2-C3: Negative disc. Mild bilateral facet arthropathy. No stenosis.

C3-C4:  Prior ACDF.  No residual stenosis.

C4-C5:  Prior ACDF.  No residual stenosis.

C5-C6: Prior ACDF. Left paracentral osseous ridging contacts the
left ventral cord. Mild spinal canal stenosis. No neuroforaminal
stenosis.

C6-C7: Prior ACDF. Residual mild to moderate bilateral
neuroforaminal stenosis due to bony hypertrophy. No spinal canal
stenosis.

C7-T1: Small circumferential disc osteophyte complex. Bilateral
uncovertebral hypertrophy. Mild spinal canal stenosis. Moderate
bilateral neuroforaminal stenosis.
IMPRESSION: 1. Prior C3-C7 ACDF with adjacent segment disease at C7-T1 resulting
in moderate bilateral neuroforaminal stenosis.
2. Left paracentral osseous ridging at C5-C6 contacts the left
ventral cord with mild spinal canal stenosis.
3. Residual mild to moderate bilateral neuroforaminal stenosis at
C6-C7 due to bony hypertrophy.
4. Mild spinal cord myelomalacia and atrophy at C4-C5 and C5-C6,
presumably sequelae of prior trauma given clinical history.

## 2022-02-10 IMAGING — CT CT ANGIO HEAD-NECK (W OR W/O PERF)
1 of 11 series · 5 of 35 positions shown · IV contrast (APPLIED)
Comparison: None Available.

CLINICAL DATA: Central vertigo

EXAM:
CT ANGIOGRAPHY HEAD AND NECK
TECHNIQUE: Multidetector CT imaging of the head and neck was performed using
the standard protocol during bolus administration of intravenous
contrast. Multiplanar CT image reconstructions and MIPs were
obtained to evaluate the vascular anatomy. Carotid stenosis
measurements (when applicable) are obtained utilizing NASCET
criteria, using the distal internal carotid diameter as the
denominator.

[Series 10: ax thin · axial · 0.43mm/px · z∈[-215,+9]mm · 5 of 338 slices shown]
[im 57/338  soft-tissue]
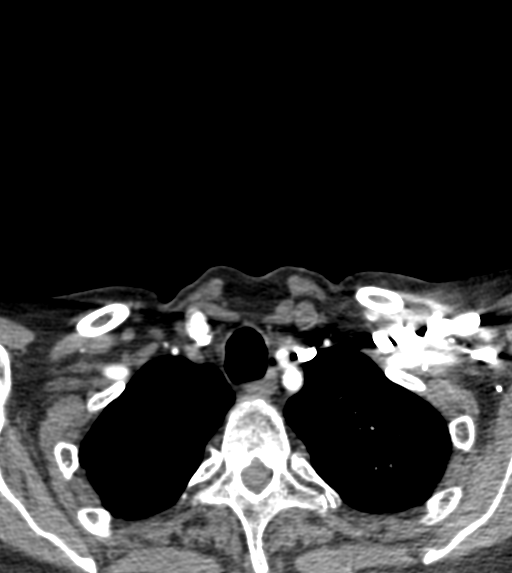
[im 113/338  bone]
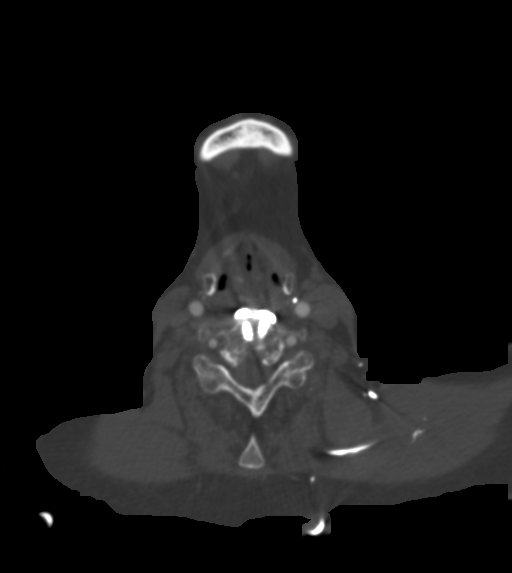
[im 169/338  soft-tissue]
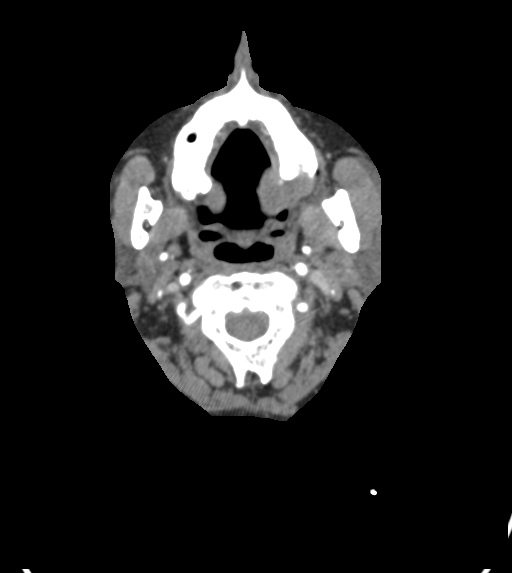
[im 225/338  bone]
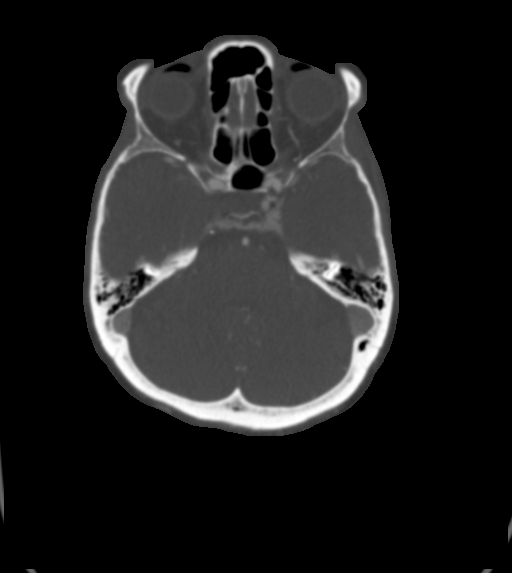
[im 281/338  soft-tissue]
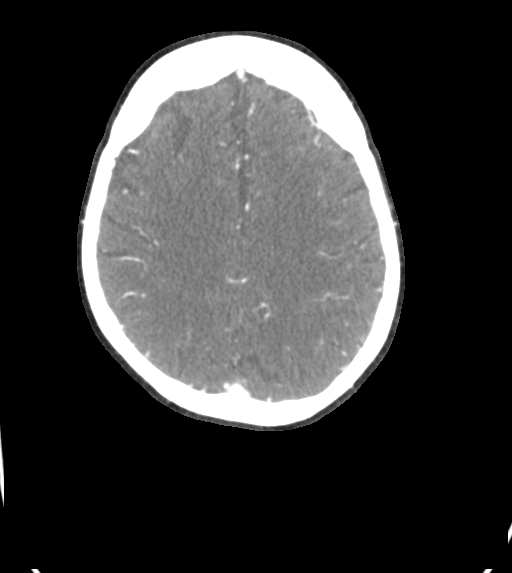

[5 of 35 positions shown; findings below may reference images not displayed]

RADIATION DOSE REDUCTION: This exam was performed according to the
departmental dose-optimization program which includes automated
exposure control, adjustment of the mA and/or kV according to
patient size and/or use of iterative reconstruction technique.

CONTRAST:  75mL OMNIPAQUE IOHEXOL 350 MG/ML SOLN
FINDINGS: CT HEAD FINDINGS

Brain: There is no mass, hemorrhage or extra-axial collection. The
size and configuration of the ventricles and extra-axial CSF spaces
are normal. There is no acute or chronic infarction. The brain
parenchyma is normal.

Skull: The visualized skull base, calvarium and extracranial soft
tissues are normal.

Sinuses/Orbits: No fluid levels or advanced mucosal thickening of
the visualized paranasal sinuses. No mastoid or middle ear effusion.
The orbits are normal.

CTA NECK FINDINGS

SKELETON: There is no bony spinal canal stenosis. No lytic or
blastic lesion. C3-7 ACDF

OTHER NECK: Normal pharynx, larynx and major salivary glands. No
cervical lymphadenopathy. Unremarkable thyroid gland.

UPPER CHEST: Biapical emphysema

AORTIC ARCH:

There is no calcific atherosclerosis of the aortic arch. There is no
aneurysm, dissection or hemodynamically significant stenosis of the
visualized portion of the aorta. Conventional 3 vessel aortic
branching pattern. The visualized proximal subclavian arteries are
widely patent.

RIGHT CAROTID SYSTEM: Normal without aneurysm, dissection or
stenosis.

LEFT CAROTID SYSTEM: Normal without aneurysm, dissection or
stenosis.

VERTEBRAL ARTERIES: Left dominant configuration. Both origins are
clearly patent. There is no dissection, occlusion or flow-limiting
stenosis to the skull base (V1-V3 segments).

CTA HEAD FINDINGS

POSTERIOR CIRCULATION:

--Vertebral arteries: Normal V4 segments.

--Inferior cerebellar arteries: Normal.

--Basilar artery: Normal.

--Superior cerebellar arteries: Normal.

--Posterior cerebral arteries (PCA): Normal.

ANTERIOR CIRCULATION:

--Intracranial internal carotid arteries: Normal.

--Anterior cerebral arteries (ACA): Normal. Both A1 segments are
present. Patent anterior communicating artery (a-comm).

--Middle cerebral arteries (MCA): Normal.

VENOUS SINUSES: As permitted by contrast timing, patent.

ANATOMIC VARIANTS: None

Review of the MIP images confirms the above findings.
IMPRESSION: No emergent large vessel occlusion or high-grade stenosis of the
intracranial or cervical arteries.

Aortic Atherosclerosis ([OB]-[OB]) and Emphysema ([OB]-[OB]).

## 2022-02-10 MED ORDER — IOHEXOL 350 MG/ML SOLN
75.0000 mL | Freq: Once | INTRAVENOUS | Status: AC | PRN
  Administered 2022-02-10: 75 mL via INTRAVENOUS

## 2022-02-10 MED ORDER — CYANOCOBALAMIN 1000 MCG/ML IJ SOLN
1000.0000 ug | INTRAMUSCULAR | Status: DC
  Administered 2022-02-10 – 2022-02-13 (×2): 1000 ug via INTRAMUSCULAR
  Filled 2022-02-10 (×2): qty 1

## 2022-02-10 NOTE — Procedures (Signed)
Routine EEG Report  Roberta Curtis is a 81 y.o. female with a history of cognitive impairment and paresthesais who is undergoing an EEG to evaluate for seizures.  Report: This EEG was acquired with electrodes placed according to the International 10-20 electrode system (including Fp1, Fp2, F3, F4, C3, C4, P3, P4, O1, O2, T3, T4, T5, T6, A1, A2, Fz, Cz, Pz). The following electrodes were missing or displaced: none.  The occipital dominant rhythm was 8.5 Hz. This activity is reactive to stimulation. Drowsiness was manifested by background fragmentation; deeper stages of sleep were identified by K complexes and sleep spindles. There was no focal slowing. There were no interictal epileptiform discharges. There were no electrographic seizures identified. There was no abnormal response to photic stimulation or hyperventilation.   Impression: This EEG was obtained while awake and asleep and is normal.    Clinical Correlation: Normal EEGs, however, do not rule out epilepsy.  Su Monks, MD [facility] [phone]  If 7pm- 7am, please page neurology on call as listed in [place].

## 2022-02-10 NOTE — Plan of Care (Signed)
  Problem: Clinical Measurements: Goal: Respiratory complications will improve Outcome: Progressing Goal: Cardiovascular complication will be avoided Outcome: Progressing   Problem: Activity: Goal: Risk for activity intolerance will decrease Outcome: Progressing   Problem: Coping: Goal: Level of anxiety will decrease Outcome: Progressing   

## 2022-02-10 NOTE — Progress Notes (Addendum)
Neurology progress note  S: Patient's sx continue to fluctuate, no headache today but feeling more L ear fullness and persistent L sided weakness and numbness. No new neurologic complaints today.  Interval data:  MRI brain: NAICP, mild CSVID, no e/o CPA or IAC mass lesion, no abnl contrast enhancement (personal review)  EEG 6/5 normal awake and asleep  B12 low at 315  O:  Vitals:   02/10/22 0725 02/10/22 1204  BP: (!) 124/51 (!) 163/59  Pulse: 66 60  Resp: 18   Temp: 98.2 F (36.8 C) 97.6 F (36.4 C)  SpO2: 97% 98%   Physical Exam Gen: A&O x4, NAD HEENT: Atraumatic, normocephalic;mucous membranes moist; oropharynx clear, tongue without atrophy or fasciculations. Neck: Supple, trachea midline. Resp: CTAB, no w/r/r CV: RRR, no m/g/r; nml S1 and S2. 2+ symmetric peripheral pulses. Abd: soft/NT/ND; nabs x 4 quad Extrem: Nml bulk; no cyanosis, clubbing, or edema.   Neuro: *MS: A&O x4. Follows multi-step commands.  *Speech: fluid, mild dysarthria, speech somewhat delayed, able to name and repeat *CN:    I: Deferred   II,III: PERRLA, VFF by confrontation, optic discs unable to be visualized 2/2 pupillary constriction   III,IV,VI: EOMI w/o nystagmus, no ptosis   V: Impaired to LT in L V2   VII: Eyelid closure was full.  Smile symmetric.   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  No tremor, rigidity or bradykinesia. No pronator drift.     Strength: Dlt Bic Tri WrE WrF FgS Gr HF KnF KnE PlF DoF    Left 4 4+ 4- 4 4+ 4 4+ 4 4+ 5 4+ 4+    Right 5 5 4+ '5 5 5 5 5 5 5 5 5      '$ *Sensory: Impaired to LT in LUE and LLE. Symmetric. Propioception intact bilat.  No double-simultaneous extinction.  *Coordination:  FNF with  past-pointing bilat but no frank ataxia *Reflexes:  3+ and symmetric throughout with 2-3 beats of clonus bilat; toes down-going bilat *Gait: deferred today  A/P: 81 yo woman with remote hx AML s/p BMT in 2007 f/b oncology outpatient with annual bone marrow biopsies, osteopenia, HL, vertigo, anxiety, cervical spine trauma with instrumentation who presented to ED on 02/08/22 for progressive neurologic sx x6 mos. The sx fluctuate but never completely resolve. Constellation of sx includes L sided weakness and numbness (face/arm/leg; progressive), vertigo, new onset headache (progressing in severity and frequency, now daily, worsens with valsalva), subjective memory impairment, vertigo, fullness and discomfort in L ear, multiple falls. The headache that worsens with valsalva is concerning for increased intracranial pressure, and several of her sx could localize to the skull base or to the L IAC. However MRI brain wwo contrast incl IACs and thin cuts through the skull base was unremarkable.  She has previous c spine trauma with instrumentation therefore will order MRI brain to rule out cervical stenosis causing the L sided weakness and numbness.  Will also order CTA H&N to rule intra- or extracranial stenosis causing the fluctuating sx esp facial droop.  I have made her NPO at [facility] tonight to allow LP to be performed tmrw with IR if above is unrevealing. I spoke to patient's oncologist Dr. Tasia Catchings  by phone and she is agreement with plan.  Recommendations: - MRI c spine wo contrast - CTA H&N - NPO at MN - Based on above results, will consider IR LP tmrw to include: OP, cell count, glucose, protein, gram stain and culture, flow cytometry, cytology, and ENC2 paraneoplastic send out to [facility] clinic - Check MMA, homocysteine, folate - B12 supp IM 1053mg q 3 days x5 doses f/b weekly after that x5 wks then recheck level in clinic - PT/OT - Will continue to follow  CSu Monks MD [facility] [phone] If 7pm- 7am, please page neurology on call as listed in [place]

## 2022-02-10 NOTE — Consult Note (Signed)
NEUROLOGY CONSULTATION NOTE   Date of service: February 10, 2022 Patient Name: Roberta Curtis MRN:  [id number] DOB:  29-Aug-1941 Reason for consult: multiple neurologic sx incl L sided weakness and numbness, vertigo Requesting physician: Dr. Nicole Kindred _ _ _   _ __   _ __ _ _  __ __   _ __   __ _  History of Present Illness   81 yo woman with remote hx AML s/p BMT in 2007 f/b oncology outpatient with annual bone marrow biopsies, osteopenia, HL, vertigo, anxiety who presented to ED on 02/08/22 for progressive neurologic sx x6 mos. The first sx she noticed were L sided numbness in V2 distribution on her face and a sense of fullness there and also in her L ear. Over the next few weeks the numbness progressed to include her entire left side including her left arm and leg.  This was not associated with pain on on that side.  He had no significant headache history prior to this year however over the last 6 months she has developed progressive headaches described as throbbing which are most prominent in the left frontotemporal region.  These increased in frequency and are now occurring nearly every day.  She is does not have nausea or photophobia with these.  More recently she has noticed that the headache are positional and worsen with Valsalva particularly during bowel movements.  She has objective weakness of her left upper and lower extremity on examination but states that she had not noticed that herself.  She did noticed that recently she has developed a fluctuating left facial droop which at times is so severe that she drools out of the left side of her mouth.  She frequently feels foggy headed and feels that her short-term memory has been mildly affected.  She is frequently dizzy described as a combination of lightheadedness and feeling like she is moving when she is not.  She has had multiple falls over the last several weeks which she attributes to dizziness and incoordination.  She did not trip over  anything and there was no identifiable trigger for the fall except for dizziness.  She had an MRI brain without contrast on admission which did not show any acute infarct or other significant findings.  She has a remote hx fall off swingset age 82 with head trauma and amnesia around the event. She had a fall several years ago and "had to have a metal plate put in her neck"   ROS   Per HPI: all other systems reviewed and are negative  Past History   I have reviewed the following:  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[place])    GERD (gastroesophageal reflux disease)    Heart murmur    Osteopenia    Thrush    Past Surgical History:  Procedure Laterality Date   BONE MARROW TRANSPLANT     BREAST LUMPECTOMY Left    fatty tumor,1970's   CATARACT EXTRACTION     CESAREAN SECTION     HYSTEROTOMY     LYMPH GLAND EXCISION     MENISCUS REPAIR Right    NECK SURGERY     SALPINGECTOMY     WISDOM TOOTH EXTRACTION     Family History  Problem Relation Age of Onset   Retinitis pigmentosa Mother    Stroke Mother    Liver cancer Father    Heart disease Father    Heart disease Brother  Blindness Brother    Social History   Socioeconomic History   Marital status: Widowed    Spouse name: Alvis Lemmings   Number of children: 3   Years of education: high school   Highest education level: Not on file  Occupational History   Not on file  Tobacco Use   Smoking status: Former    Packs/day: 1.00    Years: 6.00    Pack years: 6.00    Types: Cigarettes    Quit date: 02/20/1970    Years since quitting: 52.0   Smokeless tobacco: Never  Vaping Use   Vaping Use: Never used  Substance and Sexual Activity   Alcohol use: Not Currently   Drug use: Never   Sexual activity: Not Currently    Partners: Male    Birth control/protection: Post-menopausal  Other Topics Concern   Not on file  Social History Narrative      Right handed   Lives in a condo with  husband   From [place]      02/12/20   From: [place], moved [place] be near [place]: with Alvis Lemmings - husband   Health Care Proxy: Teresita Madura (daughter)   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      Family: Product/process development scientist, lives in [place]), [place] ([place], [place]), [place] ([place])      Enjoys: meet new people, socialable      Exercise: not currently - cleaning   Diet: picky eater - eats veggies - but her husband doesn't like these things      Safety   Seat belts: Yes    Guns: Yes  and secure   Safe in relationships: Yes    Social Determinants of Health   Financial Resource Strain: Low Risk    Difficulty of Paying Living Expenses: Not hard at all  Food Insecurity: No Food Insecurity   Worried About Charity fundraiser in the Last Year: Never true   [place] in the Last Year: Never true  Transportation Needs: No Transportation Needs   Lack of Transportation (Medical): No   Lack of Transportation (Non-Medical): No  Physical Activity: Inactive   Days of Exercise per Week: 0 days   Minutes of Exercise per Session: 0 min  Stress: No Stress Concern Present   Feeling of Stress : Not at all  Social Connections: Not on file   Allergies  Allergen Reactions   Jadenu [Deferasirox] Other (See Comments)    Elevated Iron, headache and rash   Levofloxacin Other (See Comments)   Ondansetron Hcl Other (See Comments)   Codeine Palpitations   Oxycodone-Acetaminophen Palpitations   Penicillins Rash    Medications   Medications Prior to Admission  Medication Sig Dispense Refill Last Dose   aspirin EC 81 MG tablet Take 81 mg by mouth daily.      Biotin 1 MG CAPS Take 1,000 mcg by mouth daily.       calcium carbonate (OS-CAL - DOSED IN MG OF ELEMENTAL CALCIUM) 1250 (500 Ca) MG tablet Take 1 tablet by mouth daily.      Cholecalciferol (VITAMIN D3) 50 MCG (2000 UT) CAPS Take 4,000 Units by mouth daily.      esomeprazole (NEXIUM) 20 MG  capsule TAKE 1 CAPSULE (20 MG TOTAL) BY MOUTH DAILY AT 12 NOON. 90 capsule 1 02/08/2022 at 0900   ibandronate (BONIVA) 150 MG tablet TAKE 1 TABLET (150 MG TOTAL) BY MOUTH EVERY 30 (THIRTY) DAYS.  3 tablet 1 02/05/2022 at 0800   meclizine (ANTIVERT) 12.5 MG tablet TAKE 1 TABLET BY MOUTH TWICE A DAY. TAKE 1 AT BEDTIME AS NEEDED FOR VERTIGO 180 tablet 0 Unknown at PRN   methocarbamol (ROBAXIN) 500 MG tablet Take 500 mg by mouth daily as needed for muscle spasms.   Unknown at PRN   mirabegron ER (MYRBETRIQ) 50 MG TB24 tablet Take 1 tablet (50 mg total) by mouth daily. 30 tablet  10+ days at Unknown   POTASSIUM PO Take 99 mg by mouth daily.       prochlorperazine (COMPAZINE) 10 MG tablet Take 1 tablet (10 mg total) by mouth daily. 90 tablet 1 02/07/2022 at Unknown   albuterol (VENTOLIN HFA) 108 (90 Base) MCG/ACT inhaler Inhale 1-2 puffs into the lungs as needed for wheezing or shortness of breath. (Patient not taking: Reported on 02/08/2022) 6.7 g 1 Not Taking   atorvastatin (LIPITOR) 10 MG tablet Take 1 tablet (10 mg total) by mouth daily. (Patient not taking: Reported on 02/08/2022) 90 tablet 3 Not Taking   hydrocortisone 2.5 % cream Apply twice daily, mixed with ketoconazole for 1-2 weeks (Patient not taking: Reported on 02/08/2022) 60 g 2 Not Taking   ketoconazole (NIZORAL) 2 % cream Apply to breast folds twice daily, mixed with hydrocortisone cream,  for 1-2 weeks, decrease to once daily as directed (Patient not taking: Reported on 02/08/2022) 60 g 2 Not Taking   lidocaine (LIDODERM) 5 % Place 1 patch onto the skin daily. Remove & Discard patch within 12 hours or as directed by MD (Patient not taking: Reported on 02/08/2022) 10 patch 0 Not Taking      Current Facility-Administered Medications:    acetaminophen (TYLENOL) tablet 650 mg, 650 mg, Oral, Q4H PRN, 650 mg at 02/10/22 1308 **OR** acetaminophen (TYLENOL) 160 MG/5ML solution 650 mg, 650 mg, Per Tube, Q4H PRN **OR** acetaminophen (TYLENOL) suppository 650 mg,  650 mg, Rectal, Q4H PRN, Cox, Amy N, DO   albuterol (PROVENTIL) (2.5 MG/3ML) 0.083% nebulizer solution 2.5 mg, 2.5 mg, Inhalation, PRN, Cox, Amy N, DO   enoxaparin (LOVENOX) injection 40 mg, 40 mg, Subcutaneous, QHS, Cox, Amy N, DO, 40 mg at 02/09/22 2103   hydrALAZINE (APRESOLINE) injection 5 mg, 5 mg, Intravenous, Q6H PRN, Cox, Amy N, DO   LORazepam (ATIVAN) injection 1 mg, 1 mg, Intravenous, Q4H PRN, Cox, Amy N, DO   LORazepam (ATIVAN) tablet 0.5 mg, 0.5 mg, Oral, Q6H PRN, Cox, Amy N, DO   meclizine (ANTIVERT) tablet 12.5 mg, 12.5 mg, Oral, BID PRN, Cox, Amy N, DO, 12.5 mg at 02/10/22 6578   methocarbamol (ROBAXIN) tablet 500 mg, 500 mg, Oral, Q8H PRN, Cox, Amy N, DO   pantoprazole (PROTONIX) EC tablet 40 mg, 40 mg, Oral, Daily, Cox, Amy N, DO, 40 mg at 02/10/22 4696   senna-docusate (Senokot-S) tablet 1 tablet, 1 tablet, Oral, QHS PRN, Cox, Amy N, DO  Vitals   Vitals:   02/09/22 2113 02/10/22 0005 02/10/22 0456 02/10/22 0725  BP: 137/79 (!) 117/42 (!) 98/38 (!) 124/51  Pulse: 83 61 63 66  Resp:  '18 20 18  '$ Temp:  97.8 F (36.6 C) 97.9 F (36.6 C) 98.2 F (36.8 C)  TempSrc:  Oral Oral   SpO2: 100% 98% 96% 97%  Weight:      Height:         Body mass index is 26.2 kg/m.  Physical Exam   Physical Exam Gen: A&O x4, NAD HEENT: Atraumatic, normocephalic;mucous membranes moist; oropharynx  clear, tongue without atrophy or fasciculations. Neck: Supple, trachea midline. Resp: CTAB, no w/r/r CV: RRR, no m/g/r; nml S1 and S2. 2+ symmetric peripheral pulses. Abd: soft/NT/ND; nabs x 4 quad Extrem: Nml bulk; no cyanosis, clubbing, or edema.  Neuro: *MS: A&O x4. Follows multi-step commands.  *Speech: fluid, mild dysarthria, speech somewhat delayed, able to name and repeat *CN:    I: Deferred   II,III: PERRLA, VFF by confrontation, optic discs unable to be visualized 2/2 pupillary constriction   III,IV,VI: EOMI w/o nystagmus, no ptosis   V: Impaired to LT in L V2   VII: Eyelid  closure was full.  Smile symmetric.   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  No tremor, rigidity or bradykinesia. No pronator drift.    Strength: Dlt Bic Tri WrE WrF FgS Gr HF KnF KnE PlF DoF    Left 4 4+ 4- 4 4+ 4 4+ 4 4+ 5 4+ 4+    Right 5 5 4+ '5 5 5 5 5 5 5 5 5    '$ *Sensory: Impaired to LT in LUE and LLE. Symmetric. Propioception intact bilat.  No double-simultaneous extinction.  *Coordination:  FNF with past-pointing bilat but no frank ataxia *Reflexes:  3+ and symmetric throughout with 2-3 beats of clonus bilat; toes down-going bilat *Gait: slow, unsteady, shuffling with slight dragging of LLE   Labs   CBC:  Recent Labs  Lab 02/08/22 1613  WBC 8.1  NEUTROABS 5.0  HGB 11.8*  HCT 37.5  MCV 74.6*  PLT 323    Basic Metabolic Panel:  Lab Results  Component Value Date   NA 141 02/08/2022   K 3.9 02/08/2022   CO2 23 02/08/2022   GLUCOSE 101 (H) 02/08/2022   BUN 14 02/08/2022   CREATININE 0.76 02/08/2022   CALCIUM 8.5 (L) 02/08/2022   GFRNONAA >60 02/08/2022   GFRAA >60 02/22/2020   Lipid Panel:  Lab Results  Component Value Date   LDLCALC 94 02/09/2022   HgbA1c:  Lab Results  Component Value Date   HGBA1C 6.0 (A) 11/05/2021   Urine Drug Screen:     Component Value Date/Time   LABOPIA NONE DETECTED 02/08/2022 1654   COCAINSCRNUR NONE DETECTED 02/08/2022 1654   LABBENZ NONE DETECTED 02/08/2022 1654   AMPHETMU NONE DETECTED 02/08/2022 1654   THCU NONE DETECTED 02/08/2022 1654   LABBARB NONE DETECTED 02/08/2022 1654    Alcohol Level No results found for: [place]   Impression   81 yo woman with remote hx AML s/p BMT in 2007 f/b oncology outpatient with annual bone marrow biopsies, osteopenia, HL,  vertigo, anxiety, cervical spine trauma with instrumentation who presented to ED on 02/08/22 for progressive neurologic sx x6 mos. The sx fluctuate but never completely resolve. Constellation of sx includes L sided weakness and numbness (face/arm/leg; progressive), vertigo, new onset headache (progressing in severity and frequency, now daily, worsens with valsalva), vertigo, subjective memory impairment, vertigo, fullness and discomfort in L ear, multiple falls. The headache that worsens with valsalva is concerning for increased intracranial pressure, and several of her sx could localize to the skull base or to the L IAC. Will order repeat brain MRI, this time with and without contrast to include IACS  and thin cuts through the skull base. If that is unrevealing, will proceed with imaging of her cervical spine, serum rheumatologic workup, and LP. I will reach out to her oncologist to see if he would recommend other specific workup.    Recommendations   - Repeat brain MRI, this time with and without contrast to include IACS and thin cuts through the skull base - If that is unrevealing, will proceed with imaging of her cervical spine, serum rheumatologic workup, and likely LP. - I will reach out to her oncologist to see if he would recommend other specific workup.  - Will continue to follow ______________________________________________________________________   Thank you for the opportunity to take part in the care of this patient. If you have any further questions, please contact the neurology consultation attending.  Signed,  Su Monks, MD [facility] [phone]  If 7pm- 7am, please page neurology on call as listed in [place].

## 2022-02-10 NOTE — Progress Notes (Signed)
As patient was returning from CT scan via wheelchair this evening, as she was entering her room she states she began to feel very disoriented, felt like someone was pushing her to the left side, and felt like the left side of her head was "full of air" and "very heavy".  Daughters were present in room as patient entered and noted patient to be swaying back and forth and her eyes were "big and bulgy"; patient at this time reported seeing multiple images of her daughter and flowers in the room.  Patient able to follow commands during this episode.  Patient assisted back to bed by Gerald Stabs, RN, and [place], [place].  Patient was weak, but was able to assist in lifting her legs back into bed.  Dr. Arbutus Ped paged and notified and then this RN was able to relay real time symptoms and family observances via secure chat to both Drs. Arbutus Ped and [place].  Family present in room throughout episode.

## 2022-02-10 NOTE — Progress Notes (Signed)
Physical Therapy Treatment Patient Details Name: Roberta Curtis MRN: [id number] DOB: 07-01-1941 Today's Date: 02/10/2022   History of Present Illness Pt is a 81 year old female with history of hyperlipidemia, GERD, AML in remission, anxiety, history of acute left otitis media (01/19/22), vertigo, who presents emergency department for chief concerns of left-sided weakness and left-sided facial droop. MD assessment includes TIA, weakness, and left facial numbness.  Of note, no acute intracranial process per MRI impression. Pt diagnosed with vertigo and acute otitis media by outpatient PCP on 01/19/22.    PT Comments    Pt was pleasant and motivated to participate during the session and put forth good effort throughout. Pt was able to perform supine to sit EOB mod I and required extra time and effort to perform task. Some dizziness was reported up sitting but subsided momentarily. Pt performed sit to stand RW and required verbal cues for hand placement to initiate stand. Pt ambulated out into hallway with RW and needed verbal cues for step to pattern for better stability due to LLE weakness. Pt attempted step through pattern and reported step to pattern as more comfortable and safe. There was some minimal posterior LOB in which pt was able to correct. Family was in the room and was educated on technique to assist pt with any LOB and reported being able to provide that level of assist at home. Pt will benefit from HHPT upon discharge to safely address deficits listed in patient problem list for decreased caregiver assistance and eventual return to PLOF.    Recommendations for follow up therapy are one component of a multi-disciplinary discharge planning process, led by the attending physician.  Recommendations may be updated based on patient status, additional functional criteria and insurance authorization.  Follow Up Recommendations  Home health PT     Assistance Recommended at Discharge Frequent or  constant Supervision/Assistance  Patient can return home with the following A little help with walking and/or transfers;A little help with bathing/dressing/bathroom;Assistance with cooking/housework;Help with stairs or ramp for entrance;Assist for transportation   Equipment Recommendations  Rolling walker (2 wheels)    Recommendations for Other Services       Precautions / Restrictions Precautions Precautions: Fall Restrictions Weight Bearing Restrictions: No     Mobility  Bed Mobility Overal bed mobility: Modified Independent Bed Mobility: Supine to Sit     Supine to sit: Modified independent (Device/Increase time)     General bed mobility comments: Extra time and effort required to perform task.    Transfers Overall transfer level: Needs assistance Equipment used: Rolling walker (2 wheels) Transfers: Sit to/from Stand Sit to Stand: Min guard           General transfer comment: Verbal cuing for hand placement    Ambulation/Gait Ambulation/Gait assistance: Min guard Gait Distance (Feet): 60 Feet Assistive device: Rolling walker (2 wheels) Gait Pattern/deviations: Step-to pattern Gait velocity: decreased     General Gait Details: Verbal and visual cues given during gait training with step-to pattern   Stairs             Wheelchair Mobility    Modified Rankin (Stroke Patients Only)       Balance Overall balance assessment: Needs assistance Sitting-balance support: No upper extremity supported, Feet supported Sitting balance-Leahy Scale: Good     Standing balance support: Bilateral upper extremity supported, During functional activity Standing balance-Leahy Scale: Fair  Cognition Arousal/Alertness: Awake/alert Behavior During Therapy: WFL for tasks assessed/performed Overall Cognitive Status: Within Functional Limits for tasks assessed                                           Exercises      General Comments        Pertinent Vitals/Pain Pain Assessment Pain Assessment: 0-10 Pain Score: 0-No pain    Home Living                          Prior Function            PT Goals (current goals can now be found in the care plan section) Progress towards PT goals: Progressing toward goals    Frequency    Min 2X/week      PT Plan Current plan remains appropriate    Co-evaluation              AM-PAC PT "6 Clicks" Mobility   Outcome Measure  Help needed turning from your back to your side while in a flat bed without using bedrails?: A Little Help needed moving from lying on your back to sitting on the side of a flat bed without using bedrails?: A Little Help needed moving to and from a bed to a chair (including a wheelchair)?: A Little Help needed standing up from a chair using your arms (e.g., wheelchair or bedside chair)?: A Little Help needed to walk in hospital room?: A Little Help needed climbing 3-5 steps with a railing? : A Lot 6 Click Score: 17    End of Session Equipment Utilized During Treatment: Gait belt Activity Tolerance: Patient tolerated treatment well Patient left: in chair;with family/visitor present;with call bell/phone within reach;with chair alarm set Nurse Communication: Mobility status PT Visit Diagnosis: Hemiplegia and hemiparesis;History of falling (Z91.81);Unsteadiness on feet (R26.81);Difficulty in walking, not elsewhere classified (R26.2);Muscle weakness (generalized) (M62.81) Hemiplegia - Right/Left: Left Hemiplegia - dominant/non-dominant: Non-dominant Hemiplegia - caused by: Unspecified     Time: 0350-0938 PT Time Calculation (min) (ACUTE ONLY): 27 min  Charges:                        Turner Daniels, SPT  02/10/2022, 4:48 PM

## 2022-02-10 NOTE — TOC Progression Note (Signed)
Transition of Care [facility]) - Progression Note    Patient Details  Name: Roberta Curtis MRN: [id number] Date of Birth: 1940/10/28  Transition of Care [facility]) CM/SW [place], RN Phone Number: 02/10/2022, 1:57 PM  Clinical Narrative:   Patient is set up with [facility] home health per cheryl.  Patient has received rolling walker per [employer]    Expected Discharge Plan: [place] Barriers to Discharge: Continued Medical Work up  Expected Discharge Plan and Services Expected Discharge Plan: [place]   Discharge Planning Services: CM Consult Post Acute Care Choice: Home Health, Durable Medical Equipment Living arrangements for the past 2 months: Single Family Home                 DME Arranged: Walker rolling DME Agency: [employer] Date DME Agency Contacted: 02/09/22 Time DME Agency Contacted: [phone] Representative spoke with at DME Agency: [place]: PT Media: [place] Date [place]: 02/09/22 Time [place]: [place] Representative spoke with at [place]: [place] ([place]) Interventions    Readmission Risk Interventions     View : No data to display.

## 2022-02-10 NOTE — Progress Notes (Addendum)
Progress Note   Roberta Curtis: Roberta Curtis GLO:[id number] DOB: 1941/05/03 DOA: 02/08/2022     1 DOS: the Roberta Curtis was seen and examined on 02/10/2022   Brief hospital course: Roberta Curtis is a 81 year old female with history of hyperlipidemia, GERD, anxiety, history of acute left otitis media (01/19/22), vertigo, who presents emergency department for chief concerns of left-sided weakness, left-sided facial droop upon waking up in the morning.  They reported several month history on intermittent but progressive numbness/tingling on left side of face, left upper & lower extremity, facial droop on left with drooling.  She also has had positional dizziness and sensation of "fullness" in her head that is also positional and worse with valsalva (during BM's).  Usually feels better when laying down.  ED course -- vitals notable only for uncontrolled BP up to 191/92 initially.  Labs notable only for mild anemia, Cl 114, glucose 101.  Imaging --  CT Head without contrast: 1. Decreased attenuation within the mid brain, which may represent artifact, ischemia or less likely infarct. Consider MRI... 2. Small area of nonspecific subcortical white matter hypodensity in the RIGHT parietal region, age indeterminate. Consider MRI...  MRI brain without contrast: No acute intracranial process.  Roberta Curtis was admitted with Neurology consulted for further evaluation and management.  Assessment and Plan: * Stroke-like symptoms Left facial numbness  Left facial numbness With multiple other neurologic symptoms including left facial droop, left upper and lower extremity numbness and at times weakness, dizziness, posterior headaches, ataxia. Admitted for evaluation of possible stroke or TIA.  CT head was nonacute.  No stroke on MRI.  Normal vitamin B12 and magnesium levels. EEG normal. MRI brain with without contrast including internal auditory canals on 6/5 also unrevealing -- Neurology following -- Consider ENT's  input, seen about 10 days PTA as outpatient, records not available  -- Further evaluation pending results and clinical course -- PT recommended HH, OT no follow up  TIA (transient ischemic attack) Presented with progressive intermittent stroke-like symptoms over months.  See left facial numbness for A&P. Management per neurology.   Vertigo Roberta Curtis does report positional dizziness and posterior headaches but does not describe room spinning.  Neurologic evaluation is underway as outlined. -- Meclizine as needed  Neuropathy Appears not to be on gabapentin or Lyrica  Hypochromic-microcytic anemia Hemoglobin on admission 11.8. Monitor CBC.  Weakness Normal vitamin B12 and Mg level. Chest x-ray on admission negative. Unclear etiology.  Suspect related to the underlying neurologic complaints. -- PT rec: HH, rolling walker, orders placed -- OT rec: no follow up -- Fall precautions  Urge incontinence Hold mirabegron  CKD (chronic kidney disease) stage 2, GFR 60-89 ml/min Renal function stable.  GERD (gastroesophageal reflux disease) Continue PPI  AML (acute myeloid leukemia) in remission ([place]) No acute issues. Outpatient follow-up as appropriate.        Subjective: Roberta Curtis was awake resting in bed when seen on rounds today.  She reports feeling about the same with ongoing left-sided facial and head fullness.  She asks about ENT involvement given she had seen them about a week and a half prior to admission.  Roberta Curtis has no other acute complaints at this time and no acute events reported.  Physical Exam: Vitals:   02/10/22 0005 02/10/22 0456 02/10/22 0725 02/10/22 1204  BP: (!) 117/42 (!) 98/38 (!) 124/51 (!) 163/59  Pulse: 61 63 66 60  Resp: '18 20 18   '$ Temp: 97.8 F (36.6 C) 97.9 F (36.6 C) 98.2 F (36.8 C)  97.6 F (36.4 C)  TempSrc: Oral Oral    SpO2: 98% 96% 97% 98%  Weight:      Height:       General exam: awake, alert, no acute distress HEENT: Clear  conjunctiva, moist mucus membranes, hearing grossly normal  Respiratory system: On room air, normal respiratory effort, lungs clear. Cardiovascular system: Regular in rhythm, no peripheral edema.   Central nervous system: A&O x3.  Stable deficits since yesterday.  Chronic right facial droop, also with left facial droop reportedly new, mild dysarthric speech, gait not tested Extremities: moves all, no edema, normal tone Skin: dry, intact, normal temperature, no rashes seen on visualized skin Psychiatry: normal mood, congruent affect, judgement and insight appear normal   Data Reviewed: No new labs today  Family Communication: Daughter updated by phone this afternoon  Disposition: Status is: Inpatient Remains inpatient appropriate because: Ongoing neurologic evaluation not appropriate for the outpatient setting.  Roberta Curtis also with persistent neurologic deficits     Planned Discharge Destination: Home    Time spent: 45 minutes  Author: Ezekiel Slocumb, DO 02/10/2022 1:34 PM  For on call review [email or web address].

## 2022-02-10 NOTE — Progress Notes (Signed)
Leader round completed. Patient recognized NT for excellent care (mobility, hygiene, bedside manner). Will follow-up with daughter when she returns later today regarding concerns. Orma Flaming, RN

## 2022-02-11 ENCOUNTER — Inpatient Hospital Stay: Payer: Medicare HMO

## 2022-02-11 DIAGNOSIS — Z9481 Bone marrow transplant status: Secondary | ICD-10-CM

## 2022-02-11 DIAGNOSIS — E538 Deficiency of other specified B group vitamins: Secondary | ICD-10-CM

## 2022-02-11 DIAGNOSIS — R29818 Other symptoms and signs involving the nervous system: Secondary | ICD-10-CM

## 2022-02-11 DIAGNOSIS — Z862 Personal history of diseases of the blood and blood-forming organs and certain disorders involving the immune mechanism: Secondary | ICD-10-CM

## 2022-02-11 DIAGNOSIS — R449 Unspecified symptoms and signs involving general sensations and perceptions: Secondary | ICD-10-CM

## 2022-02-11 LAB — PROTEIN, CSF: Total  Protein, CSF: 54 mg/dL — ABNORMAL HIGH (ref 15–45)

## 2022-02-11 LAB — CSF CELL COUNT WITH DIFFERENTIAL
Eosinophils, CSF: 0 %
Lymphs, CSF: 95 %
Monocyte-Macrophage-Spinal Fluid: 5 %
RBC Count, CSF: 95 /mm3 — ABNORMAL HIGH (ref 0–3)
Segmented Neutrophils-CSF: 0 %
Tube #: 3
WBC, CSF: 5 /mm3 (ref 0–5)

## 2022-02-11 LAB — GLUCOSE, CSF: Glucose, CSF: 57 mg/dL (ref 40–70)

## 2022-02-11 IMAGING — RF DG SPINAL PUNCT LUMBAR DIAG WITH FL CT GUIDANCE
3 series · 4 of 4 positions shown · non-contrast
Comparison: CT head performed [DATE] and MR brain performed
[DATE] were reviewed prior to the procedure.

CLINICAL DATA: Patient with new complaints of dizziness, headache
and left-sided weakness and numbness request received for diagnostic
lumbar puncture.

EXAM:
DIAGNOSTIC LUMBAR PUNCTURE UNDER FLUOROSCOPIC GUIDANCE

[Series 1: fluoro_iodine 2fps_bw · 0.20mm/px · 1 of 1 slices shown (1 of 2)]
[im 1/1]
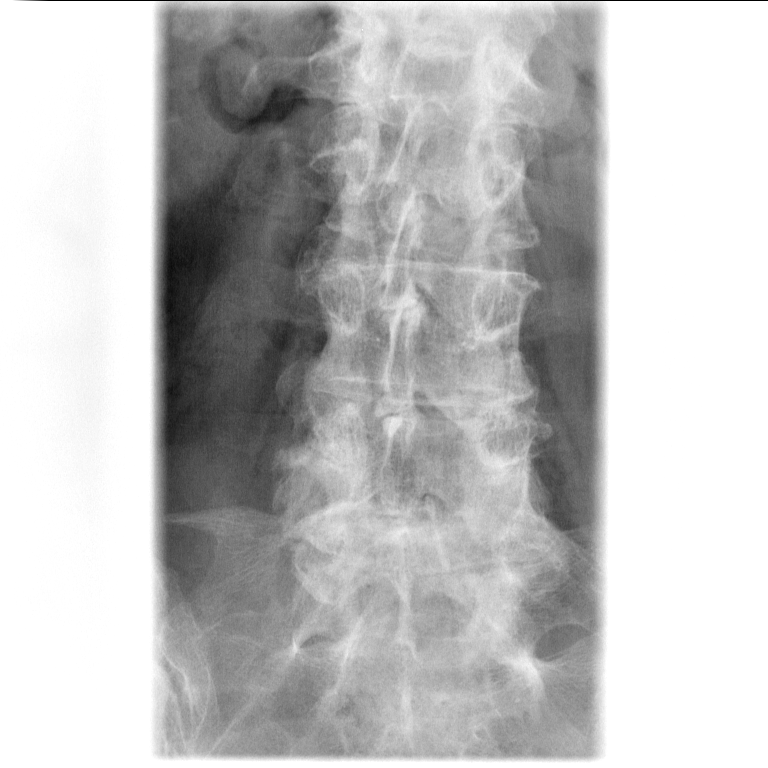

[Series 2: fluoro_iodine 2fps_bw · 0.20mm/px · 2 of 2 frames shown (2 of 2)]
[frame 1/2]
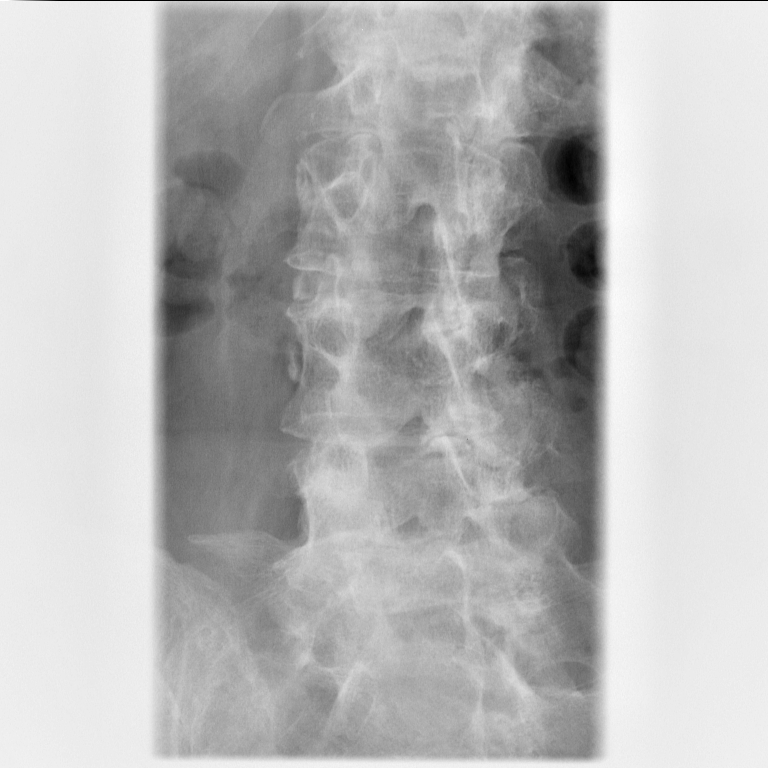
[frame 2/2]
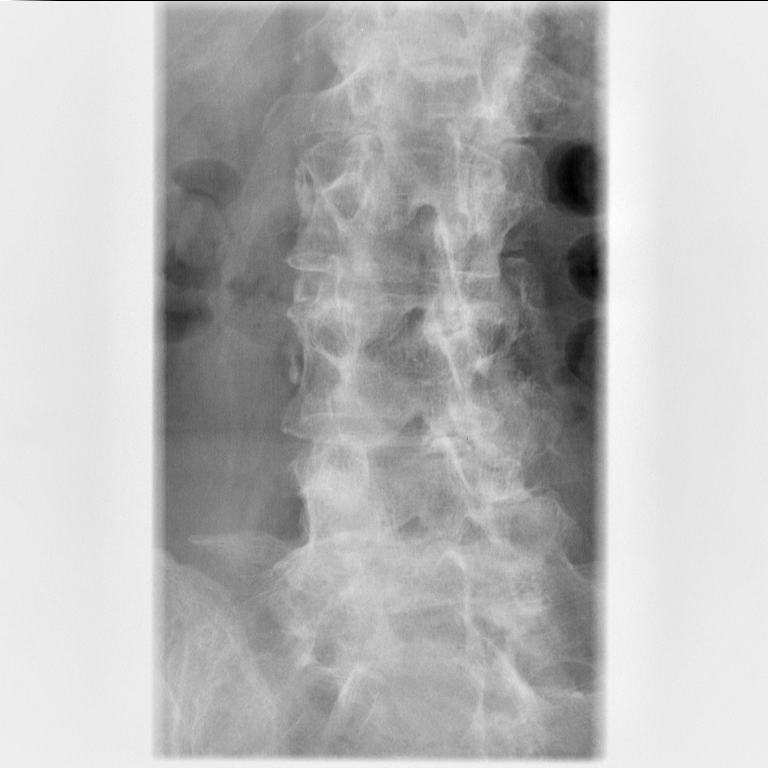

[Series 3: cp_standard · 0.09mm/px · 1 of 1 slices shown]
[im 1/1]
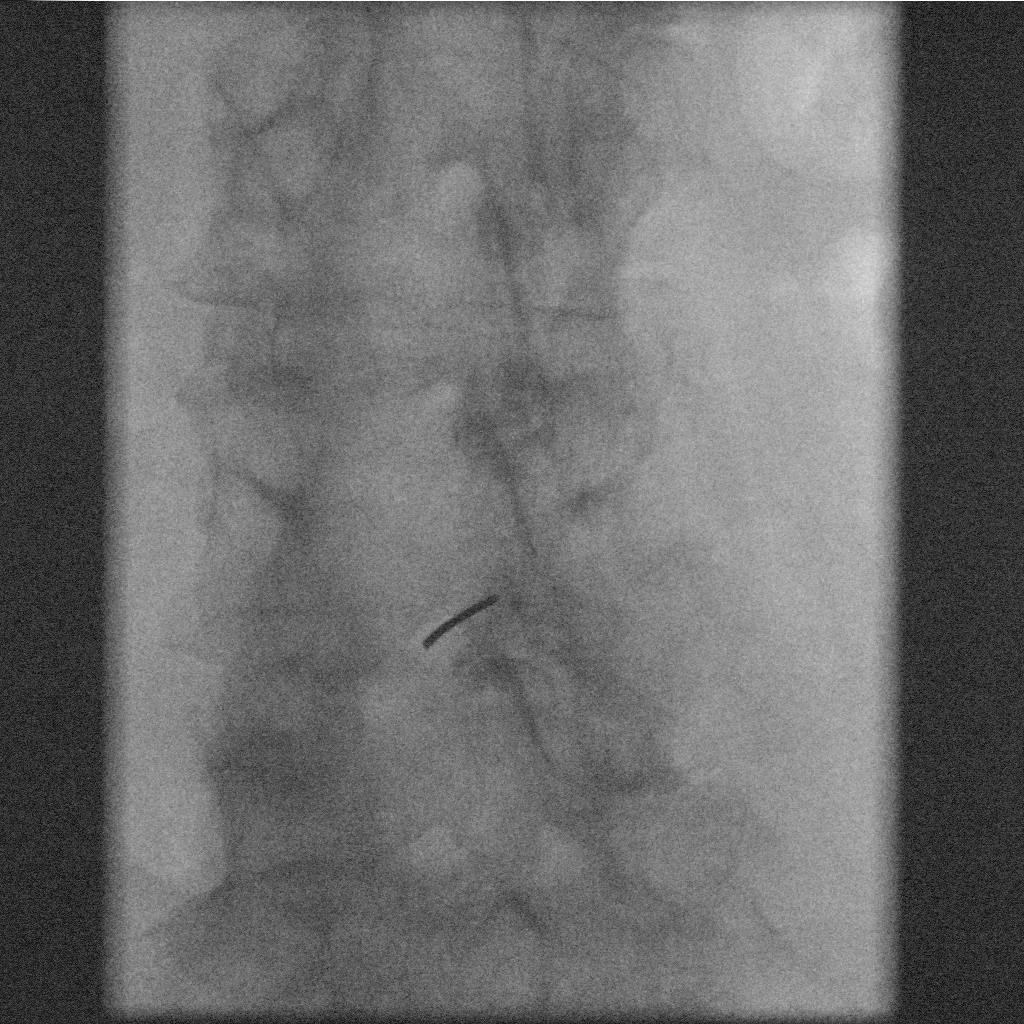

[4 of 4 positions shown; findings below may reference images not displayed]

FLUOROSCOPY TIME:  Radiation Exposure Index (if provided by the
fluoroscopic device): 7.50 mGy

PROCEDURE:
Informed consent was obtained from the patient prior to the
procedure, including potential complications of bleeding, infection,
paresthesias, nerve damage, CSF leak requiring additional
procedures, post procedure requirement to lay flat for several hours
after the procedure, headache, allergy, and pain. With the patient
prone, the lower back was prepped with Betadine. 1% Lidocaine was
used for local anesthesia. Lumbar puncture was performed at the
L4-L5 level using a 20 gauge needle with return of colorless CSF
with an opening pressure of 9 cm water. 10 ml of CSF were obtained
for laboratory studies. A closing pressure of 9 cm of water. The
inner stylet was placed back into the needle and the needle was
removed in its entirety. The patient tolerated the procedure well
and there were no apparent complications. A sterile bandage was
applied.
IMPRESSION: Technically successful fluoroscopic guided lumbar puncture.

This exam was performed by TITO, and was supervised
and interpreted by Dr. TITO.

## 2022-02-11 MED ORDER — LIDOCAINE HCL (PF) 1 % IJ SOLN
5.0000 mL | Freq: Once | INTRAMUSCULAR | Status: AC
  Administered 2022-02-11: 5 mL via INTRADERMAL

## 2022-02-11 MED ORDER — TOPIRAMATE 25 MG PO TABS
25.0000 mg | ORAL_TABLET | Freq: Every day | ORAL | Status: DC
  Administered 2022-02-11: 25 mg via ORAL
  Filled 2022-02-11 (×2): qty 1

## 2022-02-11 MED ORDER — SODIUM CHLORIDE 0.9 % IV BOLUS
1000.0000 mL | Freq: Once | INTRAVENOUS | Status: AC
  Administered 2022-02-11: 1000 mL via INTRAVENOUS

## 2022-02-11 NOTE — Progress Notes (Signed)
   02/11/22 1500  Clinical Encounter Type  Visited With Patient and family together  Visit Type Follow-up   Chaplain facilitated completion of Advance Directive.

## 2022-02-11 NOTE — Progress Notes (Addendum)
Neurology progress note  S: Patient's sx continue to fluctuate, headache slightly worse today. Underwent LP, tolerated it well. No findings on CTA H&N and MRI c spine overnight to explain patient's sx given that they are new since 6 mos ago.   Data:  LP 02/11/22 OP low at 9 RBC 95, WBC 5, protein 54, glucose 57 Gram stain: WBC seen, no organisms Pending: VDRL, flow cytometry, cytology, ENC2 autoimmune encephalopathy panel send-out to [place] [facility], culture  MRI brain: NAICP, mild CSVID, no e/o CPA or IAC mass lesion, no abnl contrast enhancement (personal review)  MRI c spine wo 1. 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 2. Left paracentral osseous ridging at C5-C6 contacts the left ventral cord with mild spinal canal stenosis. 3. Residual mild to moderate bilateral neuroforaminal stenosis at C6-C7 due to bony hypertrophy. 4. 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  CTA H&N No emergent large vessel occlusion or high-grade stenosis of the intracranial or cervical arteries.  EEG 6/5 normal awake and asleep  B12 low at 315  O:  Vitals:   02/11/22 0343 02/11/22 0753  BP: (!) 117/54 120/73  Pulse: 63 84  Resp: 15 18  Temp: 98.2 F (36.8 C) 97.9 F (36.6 C)  SpO2: 97% 99%   Physical Exam Gen: A&O x4, NAD HEENT: Atraumatic, normocephalic;mucous membranes moist; oropharynx clear, tongue without atrophy or fasciculations. Neck: Supple, trachea midline. Resp: CTAB, no w/r/r CV: RRR, no m/g/r; nml S1 and S2. 2+ symmetric peripheral pulses. Abd: soft/NT/ND; nabs x 4 quad Extrem: Nml bulk; no cyanosis, clubbing, or edema.   Neuro: *MS: A&O x4. Follows multi-step commands.  *Speech: fluid, mild dysarthria, speech somewhat delayed, able to name and repeat *CN:    I: Deferred   II,III: PERRLA, VFF by confrontation, optic discs  unable to be visualized 2/2 pupillary constriction   III,IV,VI: EOMI w/o nystagmus, no ptosis   V: Impaired to LT in L V2   VII: Eyelid closure was full.  Smile symmetric.   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  No tremor, rigidity or bradykinesia. No pronator drift.     Strength: Dlt Bic Tri WrE WrF FgS Gr HF KnF KnE PlF DoF    Left 4 4+ 4- 4 4+ 4 4+ 4 4+ 5 4+ 4+    Right 5 5 4+ '5 5 5 5 5 5 5 5 5      '$ *Sensory: Impaired to LT in LUE and LLE. Symmetric. Propioception intact bilat.  No double-simultaneous extinction.  *Coordination:  FNF with past-pointing bilat but no frank ataxia *Reflexes:  3+ and symmetric throughout with 2-3 beats of clonus bilat; toes down-going bilat *Gait: deferred today  A/P: 81 yo woman with remote hx AML s/p BMT in 2007 f/b oncology outpatient with annual bone marrow biopsies, osteopenia, HL, vertigo, anxiety, cervical spine trauma with instrumentation who presented to ED on 02/08/22 for progressive neurologic sx x6 mos. The sx fluctuate but never completely resolve. Constellation of sx includes L sided weakness and numbness (face/arm/leg; progressive), vertigo, new onset headache (progressing in severity and frequency, now daily,  worsens with valsalva), subjective memory impairment, vertigo, fullness and discomfort in L ear, multiple falls. The headache that worsens with valsalva is concerning for increased intracranial pressure, and several of her sx could localize to the skull base or to the L IAC. However MRI brain wwo contrast incl IACs and thin cuts through the skull base was unremarkable.  She has previous c spine trauma with instrumentation however MRI c spine was stable and showed no significant  canal stenosis or cord compression to explain her unilateral L weakness and numbness.  CTA to r/o vertebrobasilar insufficiency causing vertigo was negative  Will plan to keep her in the hospital until CSF labs result with the exception of ENC2 autoimmune encephalopathy panel sent to [facility] (this test takes 2-3 wks to result and may be followed up outpatient)  Patient reported today that she has been taking advil, tylenol, or both every day since her headache began approx 6 mos ago. 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  I spoke to patient's oncologist Dr. Tasia Catchings by phone and she is agreement with plan.  Recommendations: - F/u pending CSF studies: VDRL, flow cytometry, cytology, ENC2 autoimmune encephalopathy panel send-out to [facility], culture - Will plan to keep her in the hospital until CSF labs result with the exception of ENC2 autoimmune encephalopathy panel sent to [facility] clinic (this test takes 2-3 wks to result and may be followed up outpatient) - F/u MMA, homocysteine, folate - B12 supp IM 1031mg q 3 days x5 doses f/b weekly after that x5 wks then recheck level in clinic - PT/OT - Start topiramate '25mg'$  qhs for headache prophylaxis which may be uptitrated slowly to relief. At that point she should be weaned from all abortives incl advil and tylenol to no more than 2x/wk to eliminate [facility] - [facility]. EMG/NCS may also be helpful to further evaluate her L sided weakness and sensory deficits (this is not available inpatient). - Will continue to follow  CSu Monks MD [facility] [phone] If 7pm- 7am, please page neurology [place]

## 2022-02-11 NOTE — Progress Notes (Signed)
Spoke at length with patient and daughters at bedside regarding concerns yesterday and today. Follow-up completed and reviewed. Contact information provided if further concerns arise. Patient is resting in bed at this time. Orma Flaming, RN

## 2022-02-11 NOTE — Progress Notes (Signed)
PROGRESS NOTE    Gusta Marksberry [facility]  ASN:[id number] DOB: 1941-04-27  DOA: 02/08/2022 Date of Service: 02/11/22 PCP: Lesleigh Noe, MD     Brief Narrative / Hospital Course:  Ms. Mailin Coglianese is a 81 year old female with history of hyperlipidemia, GERD, anxiety, history of acute left otitis media (01/19/22), vertigo, who presents emergency department 02/08/2022 for chief concerns of left-sided weakness, left-sided facial droop upon waking up in the morning.  They reported several month history on intermittent but progressive numbness/tingling on left side of face, left upper & lower extremity, facial droop on left with drooling.  She also has had positional dizziness and sensation of "fullness" in her head that is also positional and worse with valsalva (during BM's).  Usually feels better when laying down. 02/08/2022: vitals notable for uncontrolled BP up to 191/92 initially.  Labs notable only for mild anemia, Cl 114, glucose 101. CT head without contrast read as: Decreased attenuation within the midbrain, may represent artifact, ischemia or less likely infarct. Admitted w/ concern for TIA. MRI brain without contrast: No acute intracranial process 06/05: Neurology consulted (Dr. Quinn Axe) --> MRI brain w/wo, to proceed w/ imaging C-spine, rheum w/u, LP if needed.  06/06: episode of worsening symptoms in evening, neurology aware.  06/07: LP  Consultants:  Neurology  Procedures: EEG 02/10/22: WNL LP 02/11/22   Pending labs/imaging per neurology: LP 06/05 to include: OP, cell count, glucose, protein, gram stain and culture, flow cytometry, cytology, and ENC2 paraneoplastic send out to [facility]  MMA, homocysteine, folate to eval B12 deficiency    Subjective: Patient reports no new concerns this morning, denies CP/SOB. No new neuro deficits or symptoms other than last night, see chart.      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 See left facial numbness for A&P. Management per neurology.   GERD (gastroesophageal reflux disease) Continue PPI  Left facial numbness With multiple other neurologic symptoms including left facial droop, left upper and lower extremity numbness and at times weakness, dizziness, posterior headaches, ataxia. Admitted for evaluation of possible stroke or TIA.  CT head was nonacute.  No stroke on MRI.  Normal vitamin B12 and magnesium levels. EEG normal. MRI brain with without contrast including internal auditory canals also unrevealing. CTA head/neck unrevealing. LP done today 02/11/22 and results pending  Neurology following Further evaluation pending results and clinical course, appreciate neurology input - no unifying diagnosis thus far, LP done today 02/11/22 and results pending   AML (acute myeloid leukemia) in remission ([place]) No acute issues. Outpatient follow-up as appropriate. CSF to send out for paraneoplastic evaluation   Urge incontinence Hold mirabegron  Weakness Normal vitamin B12 and Mg level. Chest x-ray on admission negative. Unclear etiology.  Suspect related to the underlying neurologic complaints. Neurology following PT rec: HH, rolling walker, orders placed OT rec: no follow up Fall precautions  CKD (chronic kidney disease) stage 2, GFR 60-89 ml/min Renal function stable.  Hypochromic-microcytic anemia Hemoglobin on admission 11.8. Monitor CBC.  Neuropathy Manage per neurology pending workup for other symptoms  Stroke-like  symptoms Left facial numbness  Vertigo Patient does report positional dizziness and posterior headaches but does not describe room spinning.  Neurologic evaluation is underway as outlined. Meclizine as needed    DVT prophylaxis: lovenox Code Status: FULL Family Communication: daughter is at bedisde this morning Disposition Plan / TOC needs: remains inpatient, plan is for return to home w/ HH, TOC following  Barriers to discharge / significant pending items: pending neurology workup as above              Objective: Vitals:   02/10/22 2023 02/11/22 0000 02/11/22 0343 02/11/22 0753  BP: (!) 138/57 (!) 129/58 (!) 117/54 120/73  Pulse: 81  63 84  Resp: '16 16 15 18  '$ Temp: 98 F (36.7 C) 98.4 F (36.9 C) 98.2 F (36.8 C) 97.9 F (36.6 C)  TempSrc: Oral Oral    SpO2: 96%  97% 99%  Weight:      Height:        Intake/Output Summary (Last 24 hours) at 02/11/2022 1502 Last data filed at 02/11/2022 1421 Gross per 24 hour  Intake 240 ml  Output 200 ml  Net 40 ml   Filed Weights   02/08/22 1558 02/08/22 2129  Weight: 63.5 kg 62.9 kg    Examination:  Constitutional:  VS as above General Appearance: alert, well-developed, well-nourished, NAD Eyes: Normal lids and conjunctive, non-icteric sclera Ears, Nose, Mouth, Throat: Normal appearance Neck: No masses, trachea midline Respiratory: Normal respiratory effort Breath sounds normal, no wheeze/rhonchi/rales Cardiovascular: S1/S2 normal, no murmur/rub/gallop auscultated No lower extremity edema Gastrointestinal: Nontender, no masses Musculoskeletal:  Gait normal Neurological: Motor intact and symmetric extremities Psychiatric: Normal judgment/insight Normal mood and affect       Scheduled Medications:   cyanocobalamin  1,000 mcg Intramuscular Q3 days   enoxaparin (LOVENOX) injection  40 mg Subcutaneous QHS   pantoprazole  40 mg Oral Daily   topiramate  25 mg Oral QHS    Continuous  Infusions:   PRN Medications:  acetaminophen **OR** acetaminophen (TYLENOL) oral liquid 160 mg/5 mL **OR** acetaminophen, albuterol, LORazepam, LORazepam, meclizine, methocarbamol, senna-docusate  Antimicrobials:  Anti-infectives (From admission, onward)    None       Data Reviewed: I have personally reviewed following labs and imaging studies  CBC: Recent Labs  Lab 02/08/22 1613  WBC 8.1  NEUTROABS 5.0  HGB 11.8*  HCT 37.5  MCV 74.6*  PLT 322   Basic Metabolic Panel: Recent Labs  Lab 02/08/22 1613 02/08/22 2145  NA 141  --   K 3.9  --   CL 114*  --   CO2 23  --   GLUCOSE 101*  --   BUN 14  --   CREATININE 0.76  --   CALCIUM 8.5*  --   MG  --  1.9   GFR: Estimated Creatinine Clearance: 48.4 mL/min (by C-G formula based on SCr of 0.76 mg/dL). Liver Function Tests: Recent Labs  Lab 02/08/22 1613  AST 24  ALT 12  ALKPHOS 61  BILITOT 0.6  PROT 7.4  ALBUMIN 4.0   No results for input(s): LIPASE, AMYLASE in the last 168 hours. No results for input(s): AMMONIA in the last 168 hours. Coagulation Profile: Recent Labs  Lab 02/08/22 1613  INR 1.1   Cardiac Enzymes: No results for input(s): CKTOTAL, CKMB, CKMBINDEX, TROPONINI in the last 168 hours. BNP (last 3 results) No results for input(s): PROBNP in the last 8760 hours. HbA1C: No results for input(s): HGBA1C in the last  72 hours. CBG: Recent Labs  Lab 02/09/22 0728 02/09/22 1139  GLUCAP 94 127*   Lipid Profile: Recent Labs    02/09/22 0355  CHOL 152  HDL 42  LDLCALC 94  TRIG 80  CHOLHDL 3.6   Thyroid Function Tests: No results for input(s): TSH, T4TOTAL, FREET4, T3FREE, THYROIDAB in the last 72 hours. Anemia Panel: Recent Labs    02/08/22 2145 02/10/22 2052  VITAMINB12 315  --   FOLATE  --  13.8   Urine analysis:    Component Value Date/Time   COLORURINE STRAW (A) 12/20/2021 1300   APPEARANCEUR CLEAR (A) 12/20/2021 1300   APPEARANCEUR Cloudy (A) 11/24/2021 0911   LABSPEC  1.041 (H) 12/20/2021 1300   PHURINE 6.0 12/20/2021 1300   GLUCOSEU NEGATIVE 12/20/2021 1300   HGBUR NEGATIVE 12/20/2021 1300   BILIRUBINUR NEGATIVE 12/20/2021 1300   BILIRUBINUR Negative 11/24/2021 0911   KETONESUR NEGATIVE 12/20/2021 1300   PROTEINUR NEGATIVE 12/20/2021 1300   NITRITE NEGATIVE 12/20/2021 1300   LEUKOCYTESUR NEGATIVE 12/20/2021 1300   Sepsis Labs: '@LABRCNTIP'$ (procalcitonin:4,lacticidven:4)  Recent Results (from the past 240 hour(s))  CSF culture w Gram Stain     Status: None (Preliminary result)   Collection Time: 02/11/22 11:50 AM   Specimen: PATH Cytology CSF; Cerebrospinal Fluid  Result Value Ref Range Status   Specimen Description CSF  Final   Special Requests NONE  Final   Gram Stain   Final    WBC SEEN RED BLOOD CELLS NO ORGANISMS SEEN Performed at [facility], [address] [place], [place] [address]    Culture PENDING  Incomplete   Report Status PENDING  Incomplete         Radiology Studies last 96 hours: CT ANGIO HEAD NECK W WO CM  Result Date: 02/10/2022 CLINICAL DATA:  Central vertigo EXAM: CT ANGIOGRAPHY HEAD AND NECK TECHNIQUE: Multidetector CT imaging of the head and neck was performed using the standard protocol during bolus administration of intravenous contrast. Multiplanar CT image reconstructions and MIPs were obtained to evaluate the vascular anatomy. Carotid stenosis measurements (when applicable) are obtained utilizing NASCET criteria, using the distal internal carotid diameter as the denominator. RADIATION DOSE REDUCTION: This exam was performed according to the departmental dose-optimization program which includes automated exposure control, adjustment of the mA and/or kV according to patient size and/or use of iterative reconstruction technique. CONTRAST:  48m OMNIPAQUE IOHEXOL 350 MG/ML SOLN COMPARISON:  None Available. FINDINGS: CT HEAD FINDINGS Brain: There is no mass, hemorrhage or extra-axial collection. The size and  configuration of the ventricles and extra-axial CSF spaces are normal. There is no acute or chronic infarction. The brain parenchyma is normal. Skull: The visualized skull base, calvarium and extracranial soft tissues are normal. Sinuses/Orbits: No fluid levels or advanced mucosal thickening of the visualized paranasal sinuses. No mastoid or middle ear effusion. The orbits are normal. CTA NECK FINDINGS SKELETON: There is no bony spinal canal stenosis. No lytic or blastic lesion. C3-7 ACDF OTHER NECK: Normal pharynx, larynx and major salivary glands. No cervical lymphadenopathy. Unremarkable thyroid gland. UPPER CHEST: Biapical emphysema AORTIC ARCH: There is no calcific atherosclerosis of the aortic arch. There is no aneurysm, dissection or hemodynamically significant stenosis of the visualized portion of the aorta. Conventional 3 vessel aortic branching pattern. The visualized proximal subclavian arteries are widely patent. RIGHT CAROTID SYSTEM: Normal without aneurysm, dissection or stenosis. LEFT CAROTID SYSTEM: Normal without aneurysm, dissection or stenosis. VERTEBRAL ARTERIES: Left dominant configuration. Both origins are clearly patent. There is no dissection,  occlusion or flow-limiting stenosis to the skull base (V1-V3 segments). CTA HEAD FINDINGS POSTERIOR CIRCULATION: --Vertebral arteries: Normal V4 segments. --Inferior cerebellar arteries: Normal. --Basilar artery: Normal. --Superior cerebellar arteries: Normal. --Posterior cerebral arteries (PCA): Normal. ANTERIOR CIRCULATION: --Intracranial internal carotid arteries: Normal. --Anterior cerebral arteries (ACA): Normal. Both A1 segments are present. Patent anterior communicating artery (a-comm). --Middle cerebral arteries (MCA): Normal. VENOUS SINUSES: As permitted by contrast timing, patent. ANATOMIC VARIANTS: None Review of the MIP images confirms the above findings. IMPRESSION: No emergent large vessel occlusion or high-grade stenosis of the  intracranial or cervical arteries. Aortic Atherosclerosis (ICD10-I70.0) and Emphysema (ICD10-J43.9). Electronically Signed   By: Ulyses Jarred M.D.   On: 02/10/2022 19:23   CT HEAD WO CONTRAST  Result Date: 02/08/2022 CLINICAL DATA:  81 year old female with acute LEFT-sided numbness, weakness and headache. EXAM: CT HEAD WITHOUT CONTRAST TECHNIQUE: Contiguous axial images were obtained from the base of the skull through the vertex without intravenous contrast. RADIATION DOSE REDUCTION: This exam was performed according to the departmental dose-optimization program which includes automated exposure control, adjustment of the mA and/or kV according to patient size and/or use of iterative reconstruction technique. COMPARISON:  None Available. FINDINGS: Brain: Decreased attenuation within the mid brain noted and may represent artifact, ischemia or less likely infarct. Subcortical white matter a small area of subcortical white matter hypodensity in the RIGHT parietal region (series 2: Images 18-19) No other evidence of acute infarction, hemorrhage, hydrocephalus, extra-axial collection or mass lesion/mass effect. Vascular: Carotid atherosclerotic calcifications are noted. Skull: Normal. Negative for fracture or focal lesion. Sinuses/Orbits: No acute abnormality. Other: None. IMPRESSION: 1. Decreased attenuation within the mid brain, which may represent artifact, ischemia or less likely infarct. Consider MRI for further evaluation as clinically indicated. 2. Small area of nonspecific subcortical white matter hypodensity in the RIGHT parietal region, age indeterminate. Consider MRI for further evaluation. Electronically Signed   By: Margarette Canada M.D.   On: 02/08/2022 16:46   MR BRAIN WO CONTRAST  Result Date: 02/08/2022 CLINICAL DATA:  Left-sided numbness and weakness EXAM: MRI HEAD WITHOUT CONTRAST TECHNIQUE: Multiplanar, multiecho pulse sequences of the brain and surrounding structures were obtained without  intravenous contrast. COMPARISON:  No prior MRI, correlation is made with CT head 02/08/2022 FINDINGS: Brain: No restricted diffusion to suggest acute or subacute infarct. No acute hemorrhage, mass mass effect, or midline shift. No hemosiderin deposition to suggest remote hemorrhage. No hydrocephalus or extra-axial collection. Minimal T2 hyperintense signal in the periventricular white matter, likely the sequela of mild chronic small vessel ischemic disease. The right parietal hypodensity on the prior CT correlates with a possible remote infarct, although this is not associated with T2 hyperintense signal on FLAIR as would be expected with a remote infarct. No correlate is seen for the decreased attenuation in the midbrain on CT. Vascular: Normal arterial flow voids. Skull and upper cervical spine: Normal marrow signal. Sinuses/Orbits: Evaluation of the left maxillary sinus is somewhat limited by susceptibility artifact. Otherwise clear paranasal sinuses. Status post bilateral lens replacements. Other: Trace fluid in the right mastoid air cells. IMPRESSION: No acute intracranial process. Electronically Signed   By: Merilyn Baba M.D.   On: 02/08/2022 18:08   MR CERVICAL SPINE WO CONTRAST  Result Date: 02/10/2022 CLINICAL DATA:  Left-sided weakness and numbness. History of prior cervical spine trauma and fusion. EXAM: MRI CERVICAL SPINE WITHOUT CONTRAST TECHNIQUE: Multiplanar, multisequence MR imaging of the cervical spine was performed. No intravenous contrast was administered. COMPARISON:  CTA head and neck from  same day. FINDINGS: Alignment: Physiologic. Vertebrae: No fracture, evidence of discitis, or bone lesion. Prior C3-C7 ACDF. Cord: Small foci increased T2 signal within the spinal cord at C4-C5 and C5-C6 likely represent myelomalacia given mild cord atrophy in this region. Posterior Fossa, vertebral arteries, paraspinal tissues: Negative. Disc levels: C2-C3: Negative disc. Mild bilateral facet  arthropathy. No stenosis. C3-C4:  Prior ACDF.  No residual stenosis. C4-C5:  Prior ACDF.  No residual stenosis. C5-C6: Prior ACDF. Left paracentral osseous ridging contacts the left ventral cord. Mild spinal canal stenosis. No neuroforaminal stenosis. C6-C7: Prior ACDF. Residual mild to moderate bilateral neuroforaminal stenosis due to bony hypertrophy. No spinal canal stenosis. C7-T1: Small circumferential disc osteophyte complex. Bilateral uncovertebral hypertrophy. Mild spinal canal stenosis. Moderate bilateral neuroforaminal stenosis. IMPRESSION: 1. 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 2. Left paracentral osseous ridging at C5-C6 contacts the left ventral cord with mild spinal canal stenosis. 3. Residual mild to moderate bilateral neuroforaminal stenosis at C6-C7 due to bony hypertrophy. 4. 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 Electronically Signed   By: Titus Dubin M.D.   On: 02/10/2022 20:25   DG Chest Port 1 View  Result Date: 02/08/2022 CLINICAL DATA:  Left-sided weakness EXAM: PORTABLE CHEST 1 VIEW COMPARISON:  06/12/2021 FINDINGS: Hardware in the cervical spine. Mild diffuse chronic interstitial opacity. No acute consolidation, pleural effusion, or pneumothorax. Stable cardiomediastinal silhouette. IMPRESSION: No active disease. Mild diffuse chronic appearing interstitial opacity Electronically Signed   By: Donavan Foil M.D.   On: 02/08/2022 21:49   EEG adult  Result Date: 02/10/2022 Derek Jack, MD     02/10/2022 12:55 PM Routine EEG Report Jermya Dowding is a 81 y.o. female with a history of cognitive impairment and paresthesais who is undergoing an EEG to evaluate for seizures. Report: This EEG was acquired with electrodes placed according to the International 10-20 electrode system (including Fp1, Fp2, F3, F4, C3, C4, P3, P4,  O1, O2, T3, T4, T5, T6, A1, A2, Fz, Cz, Pz). The following electrodes were missing or displaced: none. The occipital dominant rhythm was 8.5 Hz. This activity is reactive to stimulation. Drowsiness was manifested by background fragmentation; deeper stages of sleep were identified by K complexes and sleep spindles. There was no focal slowing. There were no interictal epileptiform discharges. There were no electrographic seizures identified. There was no abnormal response to photic stimulation or hyperventilation. Impression: This EEG was obtained while awake and asleep and is normal.   Clinical Correlation: Normal EEGs, however, do not rule out epilepsy. Su Monks, MD [facility] [phone] If 7pm- 7am, please page neurology on call as listed in [place].   MR BRAIN/IAC W WO CONTRAST  Result Date: 02/09/2022 CLINICAL DATA:  Patient with progressive left sided weakness and numbness, headache worse with valsalva, left facial droop, dysarthria, foggy headedness, vertigo, falls. EXAM: MRI HEAD WITHOUT AND WITH CONTRAST TECHNIQUE: Multiplanar, multiecho pulse sequences of the brain and surrounding structures were obtained without and with intravenous contrast. CONTRAST:  53m GADAVIST GADOBUTROL 1 MMOL/ML IV SOLN COMPARISON:  MRI of the brain February 08, 2022. FINDINGS: Brain: No acute infarction, hemorrhage, hydrocephalus, extra-axial collection or mass lesion. Few scattered foci of T2 hyperintensity within the white matter of the cerebral hemispheres, nonspecific, most likely related to chronic small vessel ischemia. Mild parenchymal volume. No focus of abnormal contrast enhancement identified. Dedicated imaging through the internal auditory canals  demonstrates a normal course of cranial nerves VII and VIII without evidence of a mass or abnormal enhancement. The inner ear structures demonstrate normal signal and morphology bilaterally. No mass is present in the cerebellopontine angles. Vascular: Normal flow  voids. Skull and upper cervical spine: No focal marrow lesion identified. Sinuses/Orbits: Negative. Other: None. IMPRESSION: 1. No acute intracranial abnormality. 2. No evidence cerebellopontine angle or internal auditory canal mass lesion or abnormal contrast enhancement. Electronically Signed   By: Pedro Earls M.D.   On: 02/09/2022 14:21   DG FL GUIDED LUMBAR PUNCTURE  Result Date: 02/11/2022 CLINICAL DATA:  Patient with new complaints of dizziness, headache and left-sided weakness and numbness request received for diagnostic lumbar puncture. EXAM: DIAGNOSTIC LUMBAR PUNCTURE UNDER FLUOROSCOPIC GUIDANCE COMPARISON:  CT head performed 02/08/2022 and MR brain performed 02/09/2022 were reviewed prior to the procedure. FLUOROSCOPY TIME:  Radiation Exposure Index (if provided by the fluoroscopic device): 7.50 mGy PROCEDURE: Informed consent was obtained from the patient prior to the procedure, including potential complications of bleeding, infection, paresthesias, nerve damage, CSF leak requiring additional procedures, post procedure requirement to lay flat for several hours after the procedure, headache, allergy, and pain. With the patient prone, the lower back was prepped with Betadine. 1% Lidocaine was used for local anesthesia. Lumbar puncture was performed at the L4-L5 level using a 20 gauge needle with return of colorless CSF with an opening pressure of 9 cm water. 10 ml of CSF were obtained for laboratory studies. A closing pressure of 9 cm of water. The inner stylet was placed back into the needle and the needle was removed in its entirety. The patient tolerated the procedure well and there were no apparent complications. A sterile bandage was applied. IMPRESSION: Technically successful fluoroscopic guided lumbar puncture. This exam was performed by Tsosie Billing PA-C, and was supervised and interpreted by Dr. Anselm Pancoast. Electronically Signed   By: Markus Daft M.D.   On: 02/11/2022 12:38             LOS: 2 days    Time spent: 50 min    Emeterio Reeve, DO [facility] 02/11/2022, 3:02 PM   Staff may message me via secure chat in [place]  but this may not receive immediate response,  please page for urgent matters!  If 7PM-7AM, please contact night-coverage [email or web address]  Dictation software was used to generate the above note. Typos may occur and escape review, as with typed/written notes. Please contact Dr Sheppard Coil directly for clarity if needed.

## 2022-02-11 NOTE — Progress Notes (Signed)
PT Cancellation Note  Patient Details Name: Roberta Curtis MRN: [id number] DOB: Aug 21, 1941   Cancelled Treatment:     PT attempt. Pt had LP and now off bedrest however pt/family requesting a day to rest. PT will continue to follow and progress as able per current POC.     Willette Pa 02/11/2022, 3:31 PM

## 2022-02-11 NOTE — Assessment & Plan Note (Addendum)
Hx iron deficiency and thalassemia minor  CBC stable, iron panel no concerns

## 2022-02-12 ENCOUNTER — Inpatient Hospital Stay: Admission: RE | Admit: 2022-02-12 | Payer: Medicare HMO | Source: Ambulatory Visit

## 2022-02-12 DIAGNOSIS — E538 Deficiency of other specified B group vitamins: Secondary | ICD-10-CM

## 2022-02-12 DIAGNOSIS — R519 Headache, unspecified: Secondary | ICD-10-CM

## 2022-02-12 DIAGNOSIS — K5900 Constipation, unspecified: Secondary | ICD-10-CM

## 2022-02-12 DIAGNOSIS — R194 Change in bowel habit: Secondary | ICD-10-CM

## 2022-02-12 LAB — CBC
HCT: 34.7 % — ABNORMAL LOW (ref 36.0–46.0)
Hemoglobin: 11.2 g/dL — ABNORMAL LOW (ref 12.0–15.0)
MCH: 23.5 pg — ABNORMAL LOW (ref 26.0–34.0)
MCHC: 32.3 g/dL (ref 30.0–36.0)
MCV: 72.7 fL — ABNORMAL LOW (ref 80.0–100.0)
Platelets: 212 10*3/uL (ref 150–400)
RBC: 4.77 MIL/uL (ref 3.87–5.11)
RDW: 14.8 % (ref 11.5–15.5)
WBC: 7.6 10*3/uL (ref 4.0–10.5)
nRBC: 0 % (ref 0.0–0.2)

## 2022-02-12 LAB — CYTOLOGY - NON PAP

## 2022-02-12 LAB — BASIC METABOLIC PANEL
Anion gap: 4 — ABNORMAL LOW (ref 5–15)
BUN: 27 mg/dL — ABNORMAL HIGH (ref 8–23)
CO2: 23 mmol/L (ref 22–32)
Calcium: 9.6 mg/dL (ref 8.9–10.3)
Chloride: 109 mmol/L (ref 98–111)
Creatinine, Ser: 0.7 mg/dL (ref 0.44–1.00)
GFR, Estimated: >60 mL/min (ref >60)
Glucose, Bld: 100 mg/dL — ABNORMAL HIGH (ref 70–99)
Potassium: 3.9 mmol/L (ref 3.5–5.1)
Sodium: 136 mmol/L (ref 135–145)

## 2022-02-12 LAB — IRON AND TIBC
Iron: 97 ug/dL (ref 28–170)
Saturation Ratios: 32 % — ABNORMAL HIGH (ref 10.4–31.8)
TIBC: 304 ug/dL (ref 250–450)
UIBC: 207 ug/dL

## 2022-02-12 LAB — HOMOCYSTEINE: Homocysteine: 14.9 umol/L (ref 0.0–19.2)

## 2022-02-12 LAB — FERRITIN: Ferritin: 45 ng/mL (ref 11–307)

## 2022-02-12 LAB — VDRL, CSF: VDRL Quant, CSF: NONREACTIVE

## 2022-02-12 MED ORDER — SENNOSIDES-DOCUSATE SODIUM 8.6-50 MG PO TABS
2.0000 | ORAL_TABLET | Freq: Two times a day (BID) | ORAL | Status: AC
  Filled 2022-02-12 (×3): qty 2

## 2022-02-12 MED ORDER — POLYETHYLENE GLYCOL 3350 17 G PO PACK
17.0000 g | PACK | Freq: Two times a day (BID) | ORAL | Status: AC
  Filled 2022-02-12 (×3): qty 1

## 2022-02-12 MED ORDER — MAGNESIUM HYDROXIDE 400 MG/5ML PO SUSP
15.0000 mL | Freq: Every day | ORAL | Status: DC | PRN
Start: 2022-02-12 — End: 2022-02-14
  Administered 2022-02-12: 15 mL via ORAL
  Filled 2022-02-12: qty 30

## 2022-02-12 NOTE — Progress Notes (Signed)
Had been notified by DD family wanted to speak with AC at 1600. Was not available at that time, but arrived to unit approximately 1615, daughter was speaking with DD. Introduced myself, explained role, asked what her concerns were. She voiced concerns about the NT and the responsiveness of the staff to the call bell. Explained would discuss with NT and make float pool leader aware. Stayed and talked with her about 40 minutes. Gave her the Administrative Coordinator's phone number. Encouraged her to call anytime she had concerns about the care.

## 2022-02-12 NOTE — TOC Progression Note (Signed)
Transition of Care [facility]) - Progression Note    Patient Details  Name: Donni Oglesby MRN: [id number] Date of Birth: [birth year]  [facility]) CM/SW [place], RN Phone Number: 02/12/2022, 9:39 AM  Clinical Narrative:   Continuing medical workup.  TOC will continue to monitor.  AT this time, patient has Home health and equipment in room.  TOC to follow.    Expected Discharge Plan: [place] Barriers to Discharge: Continued Medical Work up  Expected Discharge Plan and Services Expected Discharge Plan: [place]   Discharge Planning Services: CM Consult Post Acute Care Choice: Home Health, Durable Medical Equipment Living arrangements for the past 2 months: Single Family Home                 DME Arranged: Walker rolling DME Agency: [employer] Date DME Agency Contacted: 02/09/22 Time DME Agency Contacted: [phone] Representative spoke with at DME Agency: [place]: PT [place]: [place] Date [place]: 02/09/22 Time [place]: 1417 Representative spoke with at [place]: [place] ([place]) Interventions    Readmission Risk Interventions     No data to display

## 2022-02-12 NOTE — Assessment & Plan Note (Signed)
   Start topiramate '25mg'$  qhs for headache prophylaxis, uptitrated slowly to relief.   Plan to be off all abortives incl advil and tylenol to no more than 2x/wk to eliminate [facility]

## 2022-02-12 NOTE — Assessment & Plan Note (Signed)
   B12 supp IM 1018mg q 3 days x5 doses f/b weekly after that x5 wks then recheck level in clinic

## 2022-02-12 NOTE — Progress Notes (Signed)
PROGRESS NOTE    Roberta Curtis [facility]  GGE:[id number] DOB: February 25, 1941  DOA: 02/08/2022 Date of Service: 02/12/22 PCP: Lesleigh Noe, MD     Brief Narrative / Hospital Course:  [name] is a 81 year old female with history of hyperlipidemia, GERD, anxiety, history of acute left otitis media (01/19/22), vertigo, who presents emergency department 02/08/2022 for chief concerns of left-sided weakness, left-sided facial droop upon waking up in the morning.  They reported several month history on intermittent but progressive numbness/tingling on left side of face, left upper & lower extremity, facial droop on left with drooling.  She also has had positional dizziness and sensation of "fullness" in her head that is also positional and worse with valsalva (during BM's).  Usually feels better when laying down. 02/08/2022: vitals notable for uncontrolled BP up to 191/92 initially.  Labs notable only for mild anemia, Cl 114, glucose 101. CT head without contrast read as: Decreased attenuation within the midbrain, may represent artifact, ischemia or less likely infarct. Admitted w/ concern for TIA. MRI brain without contrast: No acute intracranial process 06/05: Neurology consulted (Dr. Quinn Axe) --> MRI brain w/wo, to proceed w/ imaging C-spine, rheum w/u, LP if needed.  06/06: episode of worsening symptoms in evening, neurology aware.  06/07: LP, results pending. Plan to keep inpatient pending CSF studies: VDRL, flow cytometry, cytology, ENC2 autoimmune encephalopathy panel send-out to [facility] clinic, culture. Plan to refer to outpatient neurology at [facility] upon hospital discharge per family request. EMG/NCS may also be helpful to further evaluate her L sided weakness and sensory deficits (this is not available inpatient). Starting topiramate '25mg'$  qhs for headache prophylaxis which may be uptitrated slowly to relief. At that point she should be weaned from all abortives incl advil and tylenol to no more than 2x/wk  to eliminate [facility] 06/08: stable, no change. Awaiting full results.   Consultants:  Neurology  Procedures: EEG 02/10/22: WNL LP 02/11/22   Pending labs/imaging per neurology: LP 06/05 to include: OP, cell count, glucose, protein, gram stain and culture, flow cytometry, cytology, and ENC2 paraneoplastic send out to [facility] clinic  MMA, homocysteine, folate to eval B12 deficiency    Subjective: Pt reports symptoms about the same - dizziness / feeling "unsteady," headache now on L side, no new weakness/deficits, persistent L facial numbness and droop     ASSESSMENT & PLAN:   Principal Problem:   Stroke-like symptoms Active Problems:   Questionable TIA (transient ischemic attack)   Left facial numbness   Vertigo   Neuropathy   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 Admitted for evaluation of possible stroke or TIA.  CT head was nonacute.  No stroke on MRI.  Normal vitamin B12 and magnesium levels. EEG normal. MRI brain with without contrast including internal auditory canals also unrevealing. CTA head/neck unrevealing. LP done today 02/11/22 and results pending  Neurology following Further evaluation pending results and clinical course, appreciate neurology input - no unifying diagnosis thus far, LP done 02/11/22 and full results pending - see  neurology notes for full details   Vertigo Patient does report positional dizziness and posterior headaches but does not  describe room spinning.  Neurologic evaluation is underway as outlined. Meclizine as needed  AML (acute myeloid leukemia) in remission ([place]) No acute issues. Outpatient follow-up as appropriate. CSF to send out for paraneoplastic evaluation   Weakness Normal vitamin B12 and Mg level. Chest x-ray on admission negative. Unclear etiology.  Suspect related to the underlying neurologic complaints. Neurology following PT rec: HH, rolling walker, orders placed OT rec: no follow up Fall precautions  Headache disorder Start topiramate '25mg'$  qhs for headache prophylaxis, uptitrated slowly to relief.  Plan to be off all abortives incl advil and tylenol to no more than 2x/wk to eliminate [facility]  Neuropathy Manage per neurology pending workup for other symptoms  Iron panel normal Supplementing B12  CKD (chronic kidney disease) stage 2, GFR 60-89 ml/min Renal function stable.  Hypochromic-microcytic anemia Hemoglobin on admission 11.8. Monitor CBC.  Urge incontinence Hold mirabegron  GERD (gastroesophageal reflux disease) Continue PPI  History of anemia Hx iron deficiency and thalassemia minor CBC stable, iron panel no concerns   B12 deficiency B12 supp IM 1015mg q 3 days x5 doses f/b weekly after that x5 wks then recheck level in clinic  Constipation Bowel regimen ordered     DVT prophylaxis: lovenox Code Status: FULL Family Communication: husband and daughter at bedside on rounds today  Disposition Plan / TOC needs: remains inpatient pending LP CFS studies as above, plan is for return to home w/ HH, TOC following  Barriers to discharge / significant pending items: pending neurology workup as above              Objective: Vitals:   02/11/22 0753 02/11/22 1726 02/11/22 2057 02/12/22 0437  BP: 120/73 (!) 112/58 (!) 145/62 (!) 112/54  Pulse: 84 78 73 64  Resp: '18 18 16 16  '$ Temp: 97.9 F (36.6 C) (!) 97.5 F (36.4 C) 97.9 F (36.6 C) 97.6 F (36.4 C)   TempSrc:  Oral Oral   SpO2: 99% 97% 96% 95%  Weight:      Height:        Intake/Output Summary (Last 24 hours) at 02/12/2022 0731 Last data filed at 02/11/2022 1858 Gross per 24 hour  Intake 480 ml  Output --  Net 480 ml   Filed Weights   02/08/22 1558 02/08/22 2129  Weight: 63.5 kg 62.9 kg    Examination:  Constitutional:  VS as above General Appearance: alert, well-developed, well-nourished, NAD Eyes: Normal lids and conjunctive, non-icteric sclera Ears, Nose, Mouth, Throat: Normal appearance Neck: No masses, trachea midline Respiratory: Normal respiratory effort Breath sounds normal, no wheeze/rhonchi/rales Cardiovascular: S1/S2 normal, no murmur/rub/gallop auscultated No lower extremity edema Gastrointestinal: Nontender, no masses Musculoskeletal:  Gait normal Neurological: Motor intact and symmetric extremities Psychiatric: Normal judgment/insight Normal mood and affect       Scheduled Medications:   cyanocobalamin  1,000 mcg Intramuscular Q3 days   enoxaparin (LOVENOX) injection  40 mg Subcutaneous QHS   pantoprazole  40 mg Oral Daily   topiramate  25 mg Oral QHS    Continuous Infusions:   PRN Medications:  acetaminophen **OR** acetaminophen (TYLENOL) oral liquid 160 mg/5 mL **OR** acetaminophen, albuterol, LORazepam, LORazepam, meclizine, methocarbamol, senna-docusate  Antimicrobials:  Anti-infectives (From admission, onward)    None       Data Reviewed: I have personally reviewed following labs and imaging studies  CBC: Recent Labs  Lab 02/08/22 1613 02/12/22 00814  WBC 8.1 7.6  NEUTROABS 5.0  --   HGB 11.8* 11.2*  HCT 37.5 34.7*  MCV 74.6* 72.7*  PLT 229 694   Basic Metabolic Panel: Recent Labs  Lab 02/08/22 1613 02/08/22 2145 02/12/22 0616  NA 141  --  136  K 3.9  --  3.9  CL 114*  --  109  CO2 23  --  23  GLUCOSE 101*  --  100*  BUN 14  --  27*  CREATININE 0.76  --  0.70  CALCIUM 8.5*  --  9.6  MG  --  1.9  --     GFR: Estimated Creatinine Clearance: 48.4 mL/min (by C-G formula based on SCr of 0.7 mg/dL). Liver Function Tests: Recent Labs  Lab 02/08/22 1613  AST 24  ALT 12  ALKPHOS 61  BILITOT 0.6  PROT 7.4  ALBUMIN 4.0   No results for input(s): "LIPASE", "AMYLASE" in the last 168 hours. No results for input(s): "AMMONIA" in the last 168 hours. Coagulation Profile: Recent Labs  Lab 02/08/22 1613  INR 1.1   Cardiac Enzymes: No results for input(s): "CKTOTAL", "CKMB", "CKMBINDEX", "TROPONINI" in the last 168 hours. BNP (last 3 results) No results for input(s): "PROBNP" in the last 8760 hours. HbA1C: No results for input(s): "HGBA1C" in the last 72 hours. CBG: Recent Labs  Lab 02/09/22 0728 02/09/22 1139  GLUCAP 94 127*   Lipid Profile: No results for input(s): "CHOL", "HDL", "LDLCALC", "TRIG", "CHOLHDL", "LDLDIRECT" in the last 72 hours.  Thyroid Function Tests: No results for input(s): "TSH", "T4TOTAL", "FREET4", "T3FREE", "THYROIDAB" in the last 72 hours. Anemia Panel: Recent Labs    02/10/22 2052  FOLATE 13.8   Urine analysis:    Component Value Date/Time   COLORURINE STRAW (A) 12/20/2021 1300   APPEARANCEUR CLEAR (A) 12/20/2021 1300   APPEARANCEUR Cloudy (A) 11/24/2021 0911   LABSPEC 1.041 (H) 12/20/2021 1300   PHURINE 6.0 12/20/2021 1300   GLUCOSEU NEGATIVE 12/20/2021 1300   HGBUR NEGATIVE 12/20/2021 1300   BILIRUBINUR NEGATIVE 12/20/2021 1300   BILIRUBINUR Negative 11/24/2021 0911   KETONESUR NEGATIVE 12/20/2021 1300   PROTEINUR NEGATIVE 12/20/2021 1300   NITRITE NEGATIVE 12/20/2021 1300   LEUKOCYTESUR NEGATIVE 12/20/2021 1300   Sepsis Labs: '@LABRCNTIP'$ (procalcitonin:4,lacticidven:4)  Recent Results (from the past 240 hour(s))  CSF culture w Gram Stain     Status: None (Preliminary result)   Collection Time: 02/11/22 11:50 AM   Specimen: PATH Cytology CSF; Cerebrospinal Fluid  Result Value Ref Range Status   Specimen Description   Final     CSF Performed at [facility], [address], [place], [place] [address]    Special Requests   Final    NONE Performed at [facility], [place], [place] [address]    Gram Stain WBC SEEN RED BLOOD CELLS NO ORGANISMS SEEN   Final   Culture PENDING  Incomplete   Report Status PENDING  Incomplete         Radiology Studies last 96 hours: DG FL GUIDED LUMBAR PUNCTURE  Result Date: 02/11/2022 CLINICAL DATA:  Patient with new complaints of dizziness, headache and left-sided weakness and numbness request received for diagnostic lumbar puncture. EXAM: DIAGNOSTIC LUMBAR PUNCTURE UNDER FLUOROSCOPIC GUIDANCE COMPARISON:  CT head performed 02/08/2022 and MR brain performed 02/09/2022 were reviewed prior to the procedure. FLUOROSCOPY TIME:  Radiation Exposure Index (if provided by the fluoroscopic device): 7.50 mGy PROCEDURE: Informed consent was obtained from the patient prior to the procedure, including potential  complications of bleeding, infection, paresthesias, nerve damage, CSF leak requiring additional procedures, post procedure requirement to lay flat for several hours after the procedure, headache, allergy, and pain. With the patient prone, the lower back was prepped with Betadine. 1% Lidocaine was used for local anesthesia. Lumbar puncture was performed at the L4-L5 level using a 20 gauge needle with return of colorless CSF with an opening pressure of 9 cm water. 10 ml of CSF were obtained for laboratory studies. A closing pressure of 9 cm of water. The inner stylet was placed back into the needle and the needle was removed in its entirety. The patient tolerated the procedure well and there were no apparent complications. A sterile bandage was applied. IMPRESSION: Technically successful fluoroscopic guided lumbar puncture. This exam was performed by Tsosie Billing PA-C, and was supervised and interpreted by Dr. Anselm Pancoast. Electronically Signed   By: Markus Daft M.D.    On: 02/11/2022 12:38   MR CERVICAL SPINE WO CONTRAST  Result Date: 02/10/2022 CLINICAL DATA:  Left-sided weakness and numbness. History of prior cervical spine trauma and fusion. EXAM: MRI CERVICAL SPINE WITHOUT CONTRAST TECHNIQUE: Multiplanar, multisequence MR imaging of the cervical spine was performed. No intravenous contrast was administered. COMPARISON:  CTA head and neck from same day. FINDINGS: Alignment: Physiologic. Vertebrae: No fracture, evidence of discitis, or bone lesion. Prior C3-C7 ACDF. Cord: Small foci increased T2 signal within the spinal cord at C4-C5 and C5-C6 likely represent myelomalacia given mild cord atrophy in this region. Posterior Fossa, vertebral arteries, paraspinal tissues: Negative. Disc levels: C2-C3: Negative disc. Mild bilateral facet arthropathy. No stenosis. C3-C4:  Prior ACDF.  No residual stenosis. C4-C5:  Prior ACDF.  No residual stenosis. C5-C6: Prior ACDF. Left paracentral osseous ridging contacts the left ventral cord. Mild spinal canal stenosis. No neuroforaminal stenosis. C6-C7: Prior ACDF. Residual mild to moderate bilateral neuroforaminal stenosis due to bony hypertrophy. No spinal canal stenosis. C7-T1: Small circumferential disc osteophyte complex. Bilateral uncovertebral hypertrophy. Mild spinal canal stenosis. Moderate bilateral neuroforaminal stenosis. IMPRESSION: 1. 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 2. Left paracentral osseous ridging at C5-C6 contacts the left ventral cord with mild spinal canal stenosis. 3. Residual mild to moderate bilateral neuroforaminal stenosis at C6-C7 due to bony hypertrophy. 4. 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 Electronically Signed   By: Titus Dubin M.D.   On: 02/10/2022 20:25   CT ANGIO HEAD NECK W WO CM  Result Date: 02/10/2022 CLINICAL DATA:  Central vertigo  EXAM: CT ANGIOGRAPHY HEAD AND NECK TECHNIQUE: Multidetector CT imaging of the head and neck was performed using the standard protocol during bolus administration of intravenous contrast. Multiplanar CT image reconstructions and MIPs were obtained to evaluate the vascular anatomy. Carotid stenosis measurements (when applicable) are obtained utilizing NASCET criteria, using the distal internal carotid diameter as the denominator. RADIATION DOSE REDUCTION: This exam was performed according to the departmental dose-optimization program which includes automated exposure control, adjustment of the mA and/or kV according to patient size and/or use of iterative reconstruction technique. CONTRAST:  84m OMNIPAQUE IOHEXOL 350 MG/ML SOLN COMPARISON:  None Available. FINDINGS: CT HEAD FINDINGS Brain: There is no mass, hemorrhage or extra-axial collection. The size and configuration of the ventricles and extra-axial CSF spaces are normal. There is no acute or chronic infarction. The brain parenchyma is normal. Skull: The visualized skull base, calvarium and extracranial soft tissues are normal.  Sinuses/Orbits: No fluid levels or advanced mucosal thickening of the visualized paranasal sinuses. No mastoid or middle ear effusion. The orbits are normal. CTA NECK FINDINGS SKELETON: There is no bony spinal canal stenosis. No lytic or blastic lesion. C3-7 ACDF OTHER NECK: Normal pharynx, larynx and major salivary glands. No cervical lymphadenopathy. Unremarkable thyroid gland. UPPER CHEST: Biapical emphysema AORTIC ARCH: There is no calcific atherosclerosis of the aortic arch. There is no aneurysm, dissection or hemodynamically significant stenosis of the visualized portion of the aorta. Conventional 3 vessel aortic branching pattern. The visualized proximal subclavian arteries are widely patent. RIGHT CAROTID SYSTEM: Normal without aneurysm, dissection or stenosis. LEFT CAROTID SYSTEM: Normal without aneurysm, dissection or stenosis.  VERTEBRAL ARTERIES: Left dominant configuration. Both origins are clearly patent. There is no dissection, occlusion or flow-limiting stenosis to the skull base (V1-V3 segments). CTA HEAD FINDINGS POSTERIOR CIRCULATION: --Vertebral arteries: Normal V4 segments. --Inferior cerebellar arteries: Normal. --Basilar artery: Normal. --Superior cerebellar arteries: Normal. --Posterior cerebral arteries (PCA): Normal. ANTERIOR CIRCULATION: --Intracranial internal carotid arteries: Normal. --Anterior cerebral arteries (ACA): Normal. Both A1 segments are present. Patent anterior communicating artery (a-comm). --Middle cerebral arteries (MCA): Normal. VENOUS SINUSES: As permitted by contrast timing, patent. ANATOMIC VARIANTS: None Review of the MIP images confirms the above findings. IMPRESSION: No emergent large vessel occlusion or high-grade stenosis of the intracranial or cervical arteries. Aortic Atherosclerosis (ICD10-I70.0) and Emphysema (ICD10-J43.9). Electronically Signed   By: Ulyses Jarred M.D.   On: 02/10/2022 19:23   EEG adult  Result Date: 02/10/2022 Derek Jack, MD     02/10/2022 12:55 PM Routine EEG Report Roberta Curtis is a 81 y.o. female with a history of cognitive impairment and paresthesais who is undergoing an EEG to evaluate for seizures. Report: This EEG was acquired with electrodes placed according to the International 10-20 electrode system (including Fp1, Fp2, F3, F4, C3, C4, P3, P4, O1, O2, T3, T4, T5, T6, A1, A2, Fz, Cz, Pz). The following electrodes were missing or displaced: none. The occipital dominant rhythm was 8.5 Hz. This activity is reactive to stimulation. Drowsiness was manifested by background fragmentation; deeper stages of sleep were identified by K complexes and sleep spindles. There was no focal slowing. There were no interictal epileptiform discharges. There were no electrographic seizures identified. There was no abnormal response to photic stimulation or hyperventilation.  Impression: This EEG was obtained while awake and asleep and is normal.   Clinical Correlation: Normal EEGs, however, do not rule out epilepsy. Su Monks, MD [facility] [phone] If 7pm- 7am, please page neurology on call as listed in [place].   MR BRAIN/IAC W WO CONTRAST  Result Date: 02/09/2022 CLINICAL DATA:  Patient with progressive left sided weakness and numbness, headache worse with valsalva, left facial droop, dysarthria, foggy headedness, vertigo, falls. EXAM: MRI HEAD WITHOUT AND WITH CONTRAST TECHNIQUE: Multiplanar, multiecho pulse sequences of the brain and surrounding structures were obtained without and with intravenous contrast. CONTRAST:  50m GADAVIST GADOBUTROL 1 MMOL/ML IV SOLN COMPARISON:  MRI of the brain February 08, 2022. FINDINGS: Brain: No acute infarction, hemorrhage, hydrocephalus, extra-axial collection or mass lesion. Few scattered foci of T2 hyperintensity within the white matter of the cerebral hemispheres, nonspecific, most likely related to chronic small vessel ischemia. Mild parenchymal volume. No focus of abnormal contrast enhancement identified. Dedicated imaging through the internal auditory canals demonstrates a normal course of cranial nerves VII and VIII without evidence of a mass or abnormal enhancement. The inner ear structures demonstrate normal signal and morphology bilaterally.  No mass is present in the cerebellopontine angles. Vascular: Normal flow voids. Skull and upper cervical spine: No focal marrow lesion identified. Sinuses/Orbits: Negative. Other: None. IMPRESSION: 1. No acute intracranial abnormality. 2. No evidence cerebellopontine angle or internal auditory canal mass lesion or abnormal contrast enhancement. Electronically Signed   By: Pedro Earls M.D.   On: 02/09/2022 14:21   DG Chest Port 1 View  Result Date: 02/08/2022 CLINICAL DATA:  Left-sided weakness EXAM: PORTABLE CHEST 1 VIEW COMPARISON:  06/12/2021 FINDINGS: Hardware  in the cervical spine. Mild diffuse chronic interstitial opacity. No acute consolidation, pleural effusion, or pneumothorax. Stable cardiomediastinal silhouette. IMPRESSION: No active disease. Mild diffuse chronic appearing interstitial opacity Electronically Signed   By: Donavan Foil M.D.   On: 02/08/2022 21:49   MR BRAIN WO CONTRAST  Result Date: 02/08/2022 CLINICAL DATA:  Left-sided numbness and weakness EXAM: MRI HEAD WITHOUT CONTRAST TECHNIQUE: Multiplanar, multiecho pulse sequences of the brain and surrounding structures were obtained without intravenous contrast. COMPARISON:  No prior MRI, correlation is made with CT head 02/08/2022 FINDINGS: Brain: No restricted diffusion to suggest acute or subacute infarct. No acute hemorrhage, mass mass effect, or midline shift. No hemosiderin deposition to suggest remote hemorrhage. No hydrocephalus or extra-axial collection. Minimal T2 hyperintense signal in the periventricular white matter, likely the sequela of mild chronic small vessel ischemic disease. The right parietal hypodensity on the prior CT correlates with a possible remote infarct, although this is not associated with T2 hyperintense signal on FLAIR as would be expected with a remote infarct. No correlate is seen for the decreased attenuation in the midbrain on CT. Vascular: Normal arterial flow voids. Skull and upper cervical spine: Normal marrow signal. Sinuses/Orbits: Evaluation of the left maxillary sinus is somewhat limited by susceptibility artifact. Otherwise clear paranasal sinuses. Status post bilateral lens replacements. Other: Trace fluid in the right mastoid air cells. IMPRESSION: No acute intracranial process. Electronically Signed   By: Merilyn Baba M.D.   On: 02/08/2022 18:08   CT HEAD WO CONTRAST  Result Date: 02/08/2022 CLINICAL DATA:  81 year old female with acute LEFT-sided numbness, weakness and headache. EXAM: CT HEAD WITHOUT CONTRAST TECHNIQUE: Contiguous axial images were  obtained from the base of the skull through the vertex without intravenous contrast. RADIATION DOSE REDUCTION: This exam was performed according to the departmental dose-optimization program which includes automated exposure control, adjustment of the mA and/or kV according to patient size and/or use of iterative reconstruction technique. COMPARISON:  None Available. FINDINGS: Brain: Decreased attenuation within the mid brain noted and may represent artifact, ischemia or less likely infarct. Subcortical white matter a small area of subcortical white matter hypodensity in the RIGHT parietal region (series 2: Images 18-19) No other evidence of acute infarction, hemorrhage, hydrocephalus, extra-axial collection or mass lesion/mass effect. Vascular: Carotid atherosclerotic calcifications are noted. Skull: Normal. Negative for fracture or focal lesion. Sinuses/Orbits: No acute abnormality. Other: None. IMPRESSION: 1. Decreased attenuation within the mid brain, which may represent artifact, ischemia or less likely infarct. Consider MRI for further evaluation as clinically indicated. 2. Small area of nonspecific subcortical white matter hypodensity in the RIGHT parietal region, age indeterminate. Consider MRI for further evaluation. Electronically Signed   By: Margarette Canada M.D.   On: 02/08/2022 16:46            LOS: 3 days    Time spent: 50 min over 50% spent in communication w/ patient and family at bedside, coordination of care w/ specialist  Emeterio Reeve, DO [facility] 02/12/2022, 7:31 AM   Staff may message me via secure chat in [place]  but this may not receive immediate response,  please page for urgent matters!  If 7PM-7AM, please contact night-coverage [email or web address]  Dictation software was used to generate the above note. Typos may occur and escape review, as with typed/written notes. Please contact Dr Sheppard Coil directly for clarity if needed.

## 2022-02-12 NOTE — Progress Notes (Signed)
Physical Therapy Treatment Patient Details Name: Roberta Curtis MRN: [id number] DOB: 17-Nov-1940 Today's Date: 02/12/2022   History of Present Illness Pt is a 81 year old female with history of hyperlipidemia, GERD, AML in remission, anxiety, history of acute left otitis media (01/19/22), vertigo, who presents emergency department for chief concerns of left-sided weakness and left-sided facial droop. MD assessment includes TIA, weakness, and left facial numbness.  Of note, no acute intracranial process per MRI impression. Pt diagnosed with vertigo and acute otitis media by outpatient PCP on 01/19/22.    PT Comments    Pt was long sitting in bed with supportive daughter Investment banker, corporate) at bedside. Pt reports having been up several times throughout the day to go to BR. She reports fatigued but was agreeable to PT session. " My headache is better today." Pt was able to exit R side of bed without physical assistance however after ambulation did require min assist to reposition back into bed. She was able to stand and ambulate with RW but has several occasions of LOB during gait training with min assist to prevent fall. " My left side of my face feels pressure when I am up and walking."  Continued to endorse improved headache from previous date. Pt is awaiting results from LP previous date. Hopefully to DC home later this week with family assistance. Author did issued pt's daughter gait belt for home use. Discussed concerns with balance and reviewed safety concerns. Acute PT will continue to follow and progress as able per current POC.    Recommendations for follow up therapy are one component of a multi-disciplinary discharge planning process, led by the attending physician.  Recommendations may be updated based on patient status, additional functional criteria and insurance authorization.  Follow Up Recommendations  Home health PT     Assistance Recommended at Discharge Frequent or constant Supervision/Assistance   Patient can return home with the following A little help with walking and/or transfers;A little help with bathing/dressing/bathroom;Assistance with cooking/housework;Help with stairs or ramp for entrance;Assist for transportation   Equipment Recommendations  Rolling walker (2 wheels)       Precautions / Restrictions Precautions Precautions: Fall Restrictions Weight Bearing Restrictions: No     Mobility  Bed Mobility Overal bed mobility: Needs Assistance Bed Mobility: Rolling, Sidelying to Sit, Supine to Sit, Sit to Supine Rolling: Supervision Sidelying to sit: Supervision Supine to sit: Supervision Sit to supine: Min assist   General bed mobility comments: required a little assistance to progress BLEs into bed form EOB short sit    Transfers Overall transfer level: Needs assistance Equipment used: Rolling walker (2 wheels) Transfers: Sit to/from Stand Sit to Stand: Min guard  General transfer comment: pt safely stood from slightly elevated bed height with CGA only.    Ambulation/Gait Ambulation/Gait assistance: Min guard Gait Distance (Feet): 250 Feet Assistive device: Rolling walker (2 wheels) Gait Pattern/deviations: Step-through pattern Gait velocity: decreased     General Gait Details: Pt was able to ambulate 250 ft in hallway with RW. She does have alot of episodes of unsteadiness but is able to recover with min assist. issued gait belt to pt/family for home use at DC      Balance Overall balance assessment: Needs assistance Sitting-balance support: No upper extremity supported, Feet supported Sitting balance-Leahy Scale: Good     Standing balance support: Bilateral upper extremity supported, During functional activity Standing balance-Leahy Scale: Fair       Cognition Arousal/Alertness: Awake/alert Behavior During Therapy: WFL for tasks assessed/performed Overall Cognitive  Status: Within Functional Limits for tasks assessed      General Comments: Pt  is A and O x 4. supportive daughter Investment banker, corporate) present throughout           General Comments General comments (skin integrity, edema, etc.): discussed POC going forward. pt/daughter hopeful she can return home by the end of the weekend.      Pertinent Vitals/Pain Pain Assessment Pain Assessment: No/denies pain Pain Score: 0-No pain Pain Location: HA is improved from previous date Pain Descriptors / Indicators: Headache Pain Intervention(s): Limited activity within patient's tolerance, Monitored during session, Repositioned, Premedicated before session     PT Goals (current goals can now be found in the care plan section) Acute Rehab PT Goals Patient Stated Goal: get home Progress towards PT goals: Progressing toward goals    Frequency    Min 2X/week      PT Plan Current plan remains appropriate       AM-PAC PT "6 Clicks" Mobility   Outcome Measure  Help needed turning from your back to your side while in a flat bed without using bedrails?: A Little Help needed moving from lying on your back to sitting on the side of a flat bed without using bedrails?: A Little Help needed moving to and from a bed to a chair (including a wheelchair)?: A Little Help needed standing up from a chair using your arms (e.g., wheelchair or bedside chair)?: A Little Help needed to walk in hospital room?: A Little Help needed climbing 3-5 steps with a railing? : A Little 6 Click Score: 18    End of Session Equipment Utilized During Treatment: Gait belt Activity Tolerance: Patient tolerated treatment well Patient left: in bed;with call bell/phone within reach;with bed alarm set;with family/visitor present Nurse Communication: Mobility status PT Visit Diagnosis: Hemiplegia and hemiparesis;History of falling (Z91.81);Unsteadiness on feet (R26.81);Difficulty in walking, not elsewhere classified (R26.2);Muscle weakness (generalized) (M62.81) Hemiplegia - Right/Left: Left Hemiplegia -  dominant/non-dominant: Non-dominant Hemiplegia - caused by: Unspecified     Time: 7741-2878 PT Time Calculation (min) (ACUTE ONLY): 25 min  Charges:  $Gait Training: 8-22 mins $Therapeutic Activity: 8-22 mins                     Julaine Fusi PTA 02/13/22, 7:37 AM

## 2022-02-12 NOTE — Progress Notes (Signed)
Additional concerns about NT responsiveness voiced by daughter. Reassigned alternate NT, bath provided. AC aware to round with patient and family. Orma Flaming, RN

## 2022-02-12 NOTE — Progress Notes (Signed)
Neurology progress note  S: Patient's sx continue to fluctuate, headache slightly improved today. She is somewhat sleepy on exam, new since yesterday. Flow cytometry was unable to be performed 2/2 insufficient cells.  Data:  LP 02/11/22 OP low at 9 RBC 95, WBC 5, protein 54, glucose 57 Gram stain: WBC seen, no organisms Pending: VDRL, cytology, ENC2 autoimmune encephalopathy panel send-out to [facility], culture Flow cytometry unable to be performed 2/2 insufficient cells  MRI brain: NAICP, mild CSVID, no e/o CPA or IAC mass lesion, no abnl contrast enhancement (personal review)  MRI c spine wo 1. 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 2. Left paracentral osseous ridging at C5-C6 contacts the left ventral cord with mild spinal canal stenosis. 3. Residual mild to moderate bilateral neuroforaminal stenosis at C6-C7 due to bony hypertrophy. 4. 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  CTA H&N No emergent large vessel occlusion or high-grade stenosis of the intracranial or cervical arteries.  EEG 6/5 normal awake and asleep  B12 low at 315  O:  Vitals:   02/12/22 0820 02/12/22 1123  BP: (!) 128/54 (!) 109/58  Pulse: 65 68  Resp: 17 17  Temp: 97.8 F (36.6 C) 97.7 F (36.5 C)  SpO2: 97%    Physical Exam Gen: drowsy, O x4, NAD HEENT: Atraumatic, normocephalic;mucous membranes moist; oropharynx clear, tongue without atrophy or fasciculations. Neck: Supple, trachea midline. Resp: CTAB, no w/r/r CV: RRR, no m/g/r; nml S1 and S2. 2+ symmetric peripheral pulses. Abd: soft/NT/ND; nabs x 4 quad Extrem: Nml bulk; no cyanosis, clubbing, or edema.   Neuro: *MS: drowsy, O x4. Follows multi-step commands.  *Speech: fluid, mild dysarthria, speech somewhat delayed, able to name and repeat *CN:    I: Deferred   II,III: PERRLA,  VFF by confrontation, optic discs unable to be visualized 2/2 pupillary constriction   III,IV,VI: EOMI w/o nystagmus, no ptosis   V: Impaired to LT in L V2   VII: Eyelid closure was full.  Smile symmetric.   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  No tremor, rigidity or bradykinesia. No pronator drift.     Strength: Dlt Bic Tri WrE WrF FgS Gr HF KnF KnE PlF DoF    Left 4 4+ 4- 4 4+ 4 4+ 4 4+ 5 4+ 4+    Right 5 5 4+ '5 5 5 5 5 5 5 5 5      '$ *Sensory: Impaired to LT in LUE and LLE. Symmetric. Propioception intact bilat.  No double-simultaneous extinction.  *Coordination:  FNF with past-pointing bilat but no frank ataxia *Reflexes:  3+ and symmetric throughout with 2-3 beats of clonus bilat; toes down-going bilat *Gait: deferred today  A/P: 81 yo woman with remote hx AML s/p BMT in 2007 f/b oncology outpatient with annual bone marrow biopsies, osteopenia, HL, vertigo, anxiety, cervical spine trauma with instrumentation who presented to ED on 02/08/22 for progressive neurologic sx x6 mos. The sx fluctuate but never completely resolve. Constellation of sx includes L sided weakness and numbness (face/arm/leg; progressive), vertigo, new onset headache (progressing in severity and frequency, now daily,  worsens with valsalva), subjective memory impairment, vertigo, fullness and discomfort in L ear, multiple falls. The headache that worsens with valsalva is concerning for increased intracranial pressure, and several of her sx could localize to the skull base or to the L IAC. However MRI brain wwo contrast incl IACs and thin cuts through the skull base was unremarkable.  She has previous c spine trauma with instrumentation however MRI c spine was  stable and showed no significant canal stenosis or cord compression to explain her unilateral L weakness and numbness.  CTA to r/o vertebrobasilar insufficiency causing vertigo was negative  Will plan to keep her in the hospital until CSF labs (cytology and VDRL) result with the exception of ENC2 autoimmune encephalopathy panel sent to [facility] (this test takes 2-3 wks to result and may be followed up outpatient). Flow cytometry was unable to be performed 2/2 insufficent cells.  Patient reported today that she has been taking advil, tylenol, or both every day since her headache began approx 6 mos ago. 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 We started topiramate '25mg'$  qhs yesterday as a daily preventive but she is lethargic today so will hold tonight and see if that resolves.  I spoke to patient's oncologist Dr. Tasia Catchings by phone and she is agreement with plan.  Recommendations: - F/u pending CSF studies: VDRL, cytology, ENC2 autoimmune encephalopathy panel send-out to [facility] clinic, culture - Will plan to keep her in the hospital until CSF labs result with the exception of ENC2 autoimmune encephalopathy panel sent to [facility] clinic (this test takes 2-3 wks to result and may be followed up outpatient) - F/u MMA, homocysteine, folate - B12 supp IM 1059mg q 3 days x5 doses f/b weekly after that x5 wks then recheck level in clinic - PT/OT - Start topiramate '25mg'$  qhs for headache prophylaxis which may be uptitrated slowly to relief. At that point she should be weaned from all abortives incl advil and tylenol to no more than 2x/wk to [facility] 6[facility]8 2/2 lethargy - [facility]. EMG/NCS may also be helpful to further evaluate her L sided weakness and sensory deficits (this is not available inpatient). - Will continue to follow  CSu Monks MD [facility] [phone] If 7pm- 7am,  please page neurology [place]

## 2022-02-12 NOTE — Assessment & Plan Note (Signed)
   Bowel regimen ordered

## 2022-02-13 DIAGNOSIS — R29818 Other symptoms and signs involving the nervous system: Secondary | ICD-10-CM

## 2022-02-13 LAB — COMP PANEL: LEUKEMIA/LYMPHOMA

## 2022-02-13 LAB — METHYLMALONIC ACID, SERUM: Methylmalonic Acid, Quantitative: 499 nmol/L — ABNORMAL HIGH (ref 0–378)

## 2022-02-13 MED ORDER — PROCHLORPERAZINE EDISYLATE 10 MG/2ML IJ SOLN
10.0000 mg | Freq: Once | INTRAMUSCULAR | Status: AC
  Administered 2022-02-13: 10 mg via INTRAVENOUS
  Filled 2022-02-13: qty 2

## 2022-02-13 MED ORDER — DIPHENHYDRAMINE HCL 50 MG/ML IJ SOLN
12.5000 mg | Freq: Once | INTRAMUSCULAR | Status: AC
  Administered 2022-02-13: 12.5 mg via INTRAVENOUS
  Filled 2022-02-13: qty 1

## 2022-02-13 NOTE — Progress Notes (Signed)
PROGRESS NOTE    Roberta Curtis [facility]  HDQ:[id number] DOB: 02/11/1941  DOA: 02/08/2022 Date of Service: 02/13/22 PCP: Lesleigh Noe, MD     Brief Narrative / Hospital Course:  [name] is a 81 year old female with history of hyperlipidemia, GERD, anxiety, history of acute left otitis media (01/19/22), vertigo, who presented emergency department 02/08/2022 for chief concerns of left-sided weakness, left-sided facial droop upon waking up in the morning.  They reported several month history intermittent but progressive numbness/tingling on left side of face, left upper & lower extremity, facial droop on left with drooling.  She also has had positional dizziness and sensation of "fullness" in her head that was also reported as positional and worse with valsalva (during BM's).  Usually feels better when laying down. 02/08/2022: vitals notable for uncontrolled BP up to 191/92 initially.  Labs notable for mild anemia, Cl 114, glucose 101. CT head without contrast read as: Decreased attenuation within the midbrain, may represent artifact, ischemia or less likely infarct. Admitted w/ concern for TIA. MRI brain without contrast: No acute intracranial process 06/05: Neurology consulted (Dr. Quinn Axe) --> MRI brain w/wo, to proceed w/ imaging C-spine, rheum w/u, LP if needed.  06/06: episode of worsening symptoms in evening, neurology aware.  06/07: LP, plan to keep inpatient pending CSF studies: VDRL, flow cytometry, cytology, ENC2 autoimmune encephalopathy panel send-out to [facility], culture. Plan to refer to outpatient neurology at [facility] upon hospital discharge per family request. EMG/NCS to further evaluate her L sided weakness and sensory deficits (this is not available inpatient). Topiramate '25mg'$  qhs for headache prophylaxis which may be uptitrated slowly to relief. At that point she should be weaned from all abortives incl advil and tylenol to no more than 2x/wk to eliminate [facility] 06/08: stable, no  change. Awaiting full results.  06/09: episode of worsening facial numbness, headache, unsteadiness this morning. Dr Quinn Axe and I were in the room w/ the patient and assisted to bed. Dr. Quinn Axe, with myself and RN present, d/w patient and daughter today re: all available results including labs, imaging, LP, EEG --> have ruled out acute/life-threatening causes for symptoms and remainder of workup needs to be done outpatient. Precautions were reviewed. Home health in process. PT following. Tentative plan for discharge home tomorrow pending further developments.   Consultants:  Neurology  Procedures: EEG 02/10/22: WNL LP 02/11/22: no concerns    Subjective: Pt seen this morning concern for worsening numbness and facial droop, speech slurring, weakness, lightheaded. Assisted to be w/ Dr Quinn Axe. Headache improved w/ Rx. Later in the day c/o persistent facial fullness about the same as it's been.      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 Admitted for evaluation of possible stroke or TIA.  CT head was nonacute.  No stroke on MRI.  Normal vitamin B12 and magnesium levels. No concerns on LP or EEG. EEG normal. MRI brain with  without contrast including internal auditory canals also unrevealing. CTA head/neck unrevealing.  Neurology following Further evaluation pending results and clinical course, appreciate neurology input - no unifying diagnosis thus far, LP done 02/11/22 and full results pending w/ send-out labs to [facility] - see neurology notes for full details   Questionable Vertigo Patient does report positional dizziness and posterior headaches but does not describe room spinning.  Neurologic evaluation is underway as outlined. Meclizine as needed  AML (acute myeloid leukemia) in remission ([place]) No acute issues. Outpatient follow-up as appropriate. CSF to send out for paraneoplastic evaluation, reassuring no significant cells on LP  Weakness Normal vitamin B12 and Mg level. Chest x-ray on admission negative. Unclear etiology.  Suspect related to the underlying neurologic complaints. Neurology following PT rec: HH, rolling walker, orders placed OT rec: no follow up Fall precautions B12 supplementation   Headache disorder Start topiramate '25mg'$  qhs for headache prophylaxis, uptitrated slowly to relief --> had to d/c this d/t causing grogginess  Plan to be off all abortives incl advil and tylenol to no more than 2x/wk to eliminate [facility]  Neuropathy Manage per neurology pending workup for other symptoms  Iron panel normal Supplementing B12  CKD (chronic kidney disease) stage 2, GFR 60-89 ml/min Renal function stable.  Hypochromic-microcytic anemia Hemoglobin on admission 11.8. Monitor CBC.  Urge incontinence Hold mirabegron  GERD (gastroesophageal reflux disease) Continue PPI  History of anemia Hx iron deficiency and thalassemia minor CBC stable, iron panel no concerns   B12 deficiency B12 supp IM 1031mg q 3 days x5 doses f/b weekly after that x5 wks then recheck level in clinic  Constipation Bowel regimen ordered     DVT prophylaxis: lovenox Code Status: FULL Family  Communication: discussion as above today w/ patient and daughter at bedside, Dr SQuinn Axeprimarily going over the case as well as the plan, BVelna HatchetRN was also present.  Disposition Plan / TOC needs: [facility] inpatient [facility]              Objective: Vitals:   02/12/22 2111 02/13/22 0453 02/13/22 0735 02/13/22 0833  BP: (!) 116/45 (!) 100/44 (!) 110/44 109/64  Pulse: 68 64 73 70  Resp: '16 16 16   '$ Temp: 97.7 F (36.5 C) 97.7 F (36.5 C) 98.2 F (36.8 C)   TempSrc:   Oral   SpO2: 97% 96% 97% 99%  Weight:      Height:       No intake or output data in the 24 hours ending 02/13/22 1446  Filed Weights   02/08/22 1558 02/08/22 2129  Weight: 63.5 kg 62.9 kg    Examination:  Constitutional:  VS as above General Appearance: alert, well-developed, well-nourished, NAD Eyes: Normal lids and conjunctive, non-icteric sclera Ears, Nose, Mouth, Throat: Normal appearance Neck: No masses, trachea midline Respiratory: Normal respiratory effort Breath sounds normal, no wheeze/rhonchi/rales Cardiovascular: S1/S2 normal, no murmur/rub/gallop auscultated No lower extremity edema Gastrointestinal: Nontender, no masses Musculoskeletal:  Gait normal Neurological: Alert, facial droop no change, occasinal stuttering / slurred speech but no aphasia  Psychiatric: Normal judgment/insight Normal mood and affect       Scheduled Medications:   cyanocobalamin  1,000 mcg Intramuscular Q3 days   enoxaparin (LOVENOX) injection  40 mg Subcutaneous QHS   pantoprazole  40 mg Oral Daily  polyethylene glycol  17 g Oral BID   senna-docusate  2 tablet Oral BID    Continuous Infusions:   PRN Medications:  acetaminophen **OR** acetaminophen (TYLENOL) oral liquid 160 mg/5 mL **OR** acetaminophen, albuterol, LORazepam, LORazepam, magnesium hydroxide, meclizine, methocarbamol  Antimicrobials:  Anti-infectives (From admission, onward)    None       Data  Reviewed: I have personally reviewed following labs and imaging studies  CBC: Recent Labs  Lab 02/08/22 1613 02/12/22 0616  WBC 8.1 7.6  NEUTROABS 5.0  --   HGB 11.8* 11.2*  HCT 37.5 34.7*  MCV 74.6* 72.7*  PLT 229 165    Basic Metabolic Panel: Recent Labs  Lab 02/08/22 1613 02/08/22 2145 02/12/22 0616  NA 141  --  136  K 3.9  --  3.9  CL 114*  --  109  CO2 23  --  23  GLUCOSE 101*  --  100*  BUN 14  --  27*  CREATININE 0.76  --  0.70  CALCIUM 8.5*  --  9.6  MG  --  1.9  --     GFR: Estimated Creatinine Clearance: 48.4 mL/min (by C-G formula based on SCr of 0.7 mg/dL). Liver Function Tests: Recent Labs  Lab 02/08/22 1613  AST 24  ALT 12  ALKPHOS 61  BILITOT 0.6  PROT 7.4  ALBUMIN 4.0    No results for input(s): "LIPASE", "AMYLASE" in the last 168 hours. No results for input(s): "AMMONIA" in the last 168 hours. Coagulation Profile: Recent Labs  Lab 02/08/22 1613  INR 1.1    Cardiac Enzymes: No results for input(s): "CKTOTAL", "CKMB", "CKMBINDEX", "TROPONINI" in the last 168 hours. BNP (last 3 results) No results for input(s): "PROBNP" in the last 8760 hours. HbA1C: No results for input(s): "HGBA1C" in the last 72 hours. CBG: Recent Labs  Lab 02/09/22 0728 02/09/22 1139  GLUCAP 94 127*    Lipid Profile: No results for input(s): "CHOL", "HDL", "LDLCALC", "TRIG", "CHOLHDL", "LDLDIRECT" in the last 72 hours.  Thyroid Function Tests: No results for input(s): "TSH", "T4TOTAL", "FREET4", "T3FREE", "THYROIDAB" in the last 72 hours. Anemia Panel: Recent Labs    02/10/22 2052 02/12/22 0616  FOLATE 13.8  --   FERRITIN  --  45  TIBC  --  304  IRON  --  97    Urine analysis:    Component Value Date/Time   COLORURINE STRAW (A) 12/20/2021 1300   APPEARANCEUR CLEAR (A) 12/20/2021 1300   APPEARANCEUR Cloudy (A) 11/24/2021 0911   LABSPEC 1.041 (H) 12/20/2021 1300   PHURINE 6.0 12/20/2021 1300   GLUCOSEU NEGATIVE 12/20/2021 1300   HGBUR  NEGATIVE 12/20/2021 1300   BILIRUBINUR NEGATIVE 12/20/2021 1300   BILIRUBINUR Negative 11/24/2021 0911   KETONESUR NEGATIVE 12/20/2021 1300   PROTEINUR NEGATIVE 12/20/2021 1300   NITRITE NEGATIVE 12/20/2021 1300   LEUKOCYTESUR NEGATIVE 12/20/2021 1300   Sepsis Labs: '@LABRCNTIP'$ (procalcitonin:4,lacticidven:4)  Recent Results (from the past 240 hour(s))  CSF culture w Gram Stain     Status: None (Preliminary result)   Collection Time: 02/11/22 11:50 AM   Specimen: PATH Cytology CSF; Cerebrospinal Fluid  Result Value Ref Range Status   Specimen Description   Final    CSF Performed at [facility], [address], [place], [place] [address]    Special Requests   Final    NONE Performed at [facility], [place], [place] [address]    Gram Stain WBC SEEN RED BLOOD CELLS NO ORGANISMS SEEN  Final   Culture   Final    NO GROWTH 2 DAYS Performed at [facility], [place] [address], [place], [place] [address]    Report Status PENDING  Incomplete         Radiology Studies last 96 hours: DG FL GUIDED LUMBAR PUNCTURE  Result Date: 02/11/2022 CLINICAL DATA:  Patient with new complaints of dizziness, headache and left-sided weakness and numbness request received for diagnostic lumbar puncture. EXAM: DIAGNOSTIC LUMBAR PUNCTURE UNDER FLUOROSCOPIC GUIDANCE COMPARISON:  CT head performed 02/08/2022 and MR brain performed 02/09/2022 were reviewed prior to the procedure. FLUOROSCOPY TIME:  Radiation Exposure Index (if provided by the fluoroscopic device): 7.50 mGy PROCEDURE: Informed consent was obtained from the patient prior to the procedure, including potential complications of bleeding, infection, paresthesias, nerve damage, CSF leak requiring additional procedures, post procedure requirement to lay flat for several hours after the procedure, headache, allergy, and pain. With the patient prone, the lower back was prepped with Betadine. 1% Lidocaine was used  for local anesthesia. Lumbar puncture was performed at the L4-L5 level using a 20 gauge needle with return of colorless CSF with an opening pressure of 9 cm water. 10 ml of CSF were obtained for laboratory studies. A closing pressure of 9 cm of water. The inner stylet was placed back into the needle and the needle was removed in its entirety. The patient tolerated the procedure well and there were no apparent complications. A sterile bandage was applied. IMPRESSION: Technically successful fluoroscopic guided lumbar puncture. This exam was performed by Tsosie Billing PA-C, and was supervised and interpreted by Dr. Anselm Pancoast. Electronically Signed   By: Markus Daft M.D.   On: 02/11/2022 12:38   MR CERVICAL SPINE WO CONTRAST  Result Date: 02/10/2022 CLINICAL DATA:  Left-sided weakness and numbness. History of prior cervical spine trauma and fusion. EXAM: MRI CERVICAL SPINE WITHOUT CONTRAST TECHNIQUE: Multiplanar, multisequence MR imaging of the cervical spine was performed. No intravenous contrast was administered. COMPARISON:  CTA head and neck from same day. FINDINGS: Alignment: Physiologic. Vertebrae: No fracture, evidence of discitis, or bone lesion. Prior C3-C7 ACDF. Cord: Small foci increased T2 signal within the spinal cord at C4-C5 and C5-C6 likely represent myelomalacia given mild cord atrophy in this region. Posterior Fossa, vertebral arteries, paraspinal tissues: Negative. Disc levels: C2-C3: Negative disc. Mild bilateral facet arthropathy. No stenosis. C3-C4:  Prior ACDF.  No residual stenosis. C4-C5:  Prior ACDF.  No residual stenosis. C5-C6: Prior ACDF. Left paracentral osseous ridging contacts the left ventral cord. Mild spinal canal stenosis. No neuroforaminal stenosis. C6-C7: Prior ACDF. Residual mild to moderate bilateral neuroforaminal stenosis due to bony hypertrophy. No spinal canal stenosis. C7-T1: Small circumferential disc osteophyte complex. Bilateral uncovertebral hypertrophy. Mild spinal canal  stenosis. Moderate bilateral neuroforaminal stenosis. IMPRESSION: 1. 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 2. Left paracentral osseous ridging at C5-C6 contacts the left ventral cord with mild spinal canal stenosis. 3. Residual mild to moderate bilateral neuroforaminal stenosis at C6-C7 due to bony hypertrophy. 4. 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 Electronically Signed   By: Titus Dubin M.D.   On: 02/10/2022 20:25   CT ANGIO HEAD NECK W WO CM  Result Date: 02/10/2022 CLINICAL DATA:  Central vertigo EXAM: CT ANGIOGRAPHY HEAD AND NECK TECHNIQUE: Multidetector CT imaging of the head and neck was performed using the standard protocol during bolus administration of  intravenous contrast. Multiplanar CT image reconstructions and MIPs were obtained to evaluate the vascular anatomy. Carotid stenosis measurements (when applicable) are obtained utilizing NASCET criteria, using the distal internal carotid diameter as the denominator. RADIATION DOSE REDUCTION: This exam was performed according to the departmental dose-optimization program which includes automated exposure control, adjustment of the mA and/or kV according to patient size and/or use of iterative reconstruction technique. CONTRAST:  60m OMNIPAQUE IOHEXOL 350 MG/ML SOLN COMPARISON:  None Available. FINDINGS: CT HEAD FINDINGS Brain: There is no mass, hemorrhage or extra-axial collection. The size and configuration of the ventricles and extra-axial CSF spaces are normal. There is no acute or chronic infarction. The brain parenchyma is normal. Skull: The visualized skull base, calvarium and extracranial soft tissues are normal. Sinuses/Orbits: No fluid levels or advanced mucosal thickening of the visualized paranasal sinuses. No mastoid or middle ear effusion. The orbits are normal. CTA NECK FINDINGS  SKELETON: There is no bony spinal canal stenosis. No lytic or blastic lesion. C3-7 ACDF OTHER NECK: Normal pharynx, larynx and major salivary glands. No cervical lymphadenopathy. Unremarkable thyroid gland. UPPER CHEST: Biapical emphysema AORTIC ARCH: There is no calcific atherosclerosis of the aortic arch. There is no aneurysm, dissection or hemodynamically significant stenosis of the visualized portion of the aorta. Conventional 3 vessel aortic branching pattern. The visualized proximal subclavian arteries are widely patent. RIGHT CAROTID SYSTEM: Normal without aneurysm, dissection or stenosis. LEFT CAROTID SYSTEM: Normal without aneurysm, dissection or stenosis. VERTEBRAL ARTERIES: Left dominant configuration. Both origins are clearly patent. There is no dissection, occlusion or flow-limiting stenosis to the skull base (V1-V3 segments). CTA HEAD FINDINGS POSTERIOR CIRCULATION: --Vertebral arteries: Normal V4 segments. --Inferior cerebellar arteries: Normal. --Basilar artery: Normal. --Superior cerebellar arteries: Normal. --Posterior cerebral arteries (PCA): Normal. ANTERIOR CIRCULATION: --Intracranial internal carotid arteries: Normal. --Anterior cerebral arteries (ACA): Normal. Both A1 segments are present. Patent anterior communicating artery (a-comm). --Middle cerebral arteries (MCA): Normal. VENOUS SINUSES: As permitted by contrast timing, patent. ANATOMIC VARIANTS: None Review of the MIP images confirms the above findings. IMPRESSION: No emergent large vessel occlusion or high-grade stenosis of the intracranial or cervical arteries. Aortic Atherosclerosis (ICD10-I70.0) and Emphysema (ICD10-J43.9). Electronically Signed   By: KUlyses JarredM.D.   On: 02/10/2022 19:23   EEG adult  Result Date: 02/10/2022 SDerek Jack MD     02/10/2022 12:55 PM Routine EEG Report MZakyah [facility] a 81y.o. female with a history of cognitive impairment and paresthesais who is undergoing an EEG to evaluate for seizures.  Report: This EEG was acquired with electrodes placed according to the International 10-20 electrode system (including Fp1, Fp2, F3, F4, C3, C4, P3, P4, O1, O2, T3, T4, T5, T6, A1, A2, Fz, Cz, Pz). The following electrodes were missing or displaced: none. The occipital dominant rhythm was 8.5 Hz. This activity is reactive to stimulation. Drowsiness was manifested by background fragmentation; deeper stages of sleep were identified by K complexes and sleep spindles. There was no focal slowing. There were no interictal epileptiform discharges. There were no electrographic seizures identified. There was no abnormal response to photic stimulation or hyperventilation. Impression: This EEG was obtained while awake and asleep and is normal.   Clinical Correlation: Normal EEGs, however, do not rule out epilepsy. CSu Monks MD [facility] [phone] 7pm- 7am, please page neurology on call as listed in AMION.            LOS: 4 days    Time spent: 50 min over 50% spent in communication w/  patient and family at bedside, coordination of care w/ specialist     Emeterio Reeve, DO [facility] 02/13/2022, 2:46 PM   Staff may message me via secure chat in [place]  but this may not receive immediate response,  please page for urgent matters!  If 7PM-7AM, please contact night-coverage [email or web address]  Dictation software was used to generate the above note. Typos may occur and escape review, as with typed/written notes. Please contact Dr Sheppard Coil directly for clarity if needed.

## 2022-02-13 NOTE — Progress Notes (Signed)
Pt and family discussed potentially concidering med alert system. Med alert information placed in window seal. Daughter made aware of information placed in window seal for receipt with return to hospital.

## 2022-02-13 NOTE — Progress Notes (Signed)
Physical Therapy Treatment Patient Details Name: Roberta [facility] MRN: [id number] DOB: Dec 01, 1940 Today's Date: 02/13/2022   History of Present Illness Pt is a 81 year old female with history of hyperlipidemia, GERD, AML in remission, anxiety, history of acute left otitis media (01/19/22), vertigo, who presents emergency department for chief concerns of left-sided weakness and left-sided facial droop. MD assessment includes TIA, weakness, and left facial numbness.  Of note, no acute intracranial process per MRI impression. Pt diagnosed with vertigo and acute otitis media by outpatient PCP on 01/19/22.    PT Comments    Pt was in BR with RN tech present upon arriving. She is A and O x 4. She demonstrated much improved stability during gait with use of RW. Has a RW but will benefit from [facility]  and HHPT at DC to maximize independence.Planning to DC home tomorrow with family.   Recommendations for follow up therapy are one component of a multi-disciplinary discharge planning process, led by the attending physician.  Recommendations may be updated based on patient status, additional functional criteria and insurance authorization.  Follow Up Recommendations  Home health PT     Assistance Recommended at Discharge Frequent or constant Supervision/Assistance  Patient can return home with the following A little help with walking and/or transfers;A little help with bathing/dressing/bathroom;Assistance with cooking/housework;Help with stairs or ramp for entrance;Assist for transportation   Equipment Recommendations  BSC/3in1       Precautions / Restrictions Precautions Precautions: Fall Restrictions Weight Bearing Restrictions: No     Mobility  Bed Mobility Overal bed mobility: Needs Assistance Bed Mobility: Sit to Supine       Sit to supine: Min assist   General bed mobility comments: pt was in BR upon arriving but does require min assist to progress LE into bed from sitting EOB     Transfers Overall transfer level: Needs assistance Equipment used: Rolling walker (2 wheels) Transfers: Sit to/from Stand Sit to Stand: Supervision     Ambulation/Gait Ambulation/Gait assistance: Min guard Gait Distance (Feet): 300 Feet Assistive device: Rolling walker (2 wheels) Gait Pattern/deviations: Step-through pattern Gait velocity: decreased     General Gait Details: pt demonstrated much improved stability during ambulation today. no episodes of LOB or unsteadiness. she does continue to have slow cautious gait pattern however safe      Balance Overall balance assessment: Needs assistance Sitting-balance support: No upper extremity supported, Feet supported Sitting balance-Leahy Scale: Good     Standing balance support: Bilateral upper extremity supported, During functional activity Standing balance-Leahy Scale: Fair         Cognition Arousal/Alertness: Awake/alert Behavior During Therapy: WFL for tasks assessed/performed Overall Cognitive Status: Within Functional Limits for tasks assessed    General Comments: pt is A and O x 4               Pertinent Vitals/Pain Pain Assessment Pain Assessment: No/denies pain Pain Score: 0-No pain     PT Goals (current goals can now be found in the care plan section) Acute Rehab PT Goals Patient Stated Goal: get home Progress towards PT goals: Progressing toward goals    Frequency    Min 2X/week      PT Plan Current plan remains appropriate       AM-PAC PT "6 Clicks" Mobility   Outcome Measure  Help needed turning from your back to your side while in a flat bed without using bedrails?: A Little Help needed moving from lying on your back to sitting on the side  of a flat bed without using bedrails?: A Little Help needed moving to and from a bed to a chair (including a wheelchair)?: A Little Help needed standing up from a chair using your arms (e.g., wheelchair or bedside chair)?: A Little Help needed  to walk in hospital room?: A Little Help needed climbing 3-5 steps with a railing? : A Little 6 Click Score: 18    End of Session Equipment Utilized During Treatment: Gait belt Activity Tolerance: Patient tolerated treatment well Patient left: in bed;with call bell/phone within reach;with bed alarm set Nurse Communication: Mobility status PT Visit Diagnosis: Hemiplegia and hemiparesis;History of falling (Z91.81);Unsteadiness on feet (R26.81);Difficulty in walking, not elsewhere classified (R26.2);Muscle weakness (generalized) (M62.81) Hemiplegia - Right/Left: Left Hemiplegia - dominant/non-dominant: Non-dominant Hemiplegia - caused by: Unspecified     Time: 1345-1425 PT Time Calculation (min) (ACUTE ONLY): 40 min  Charges:  $Gait Training: 23-37 mins $Therapeutic Activity: 8-22 mins                     Julaine Fusi PTA 02/13/22, 4:13 PM

## 2022-02-13 NOTE — Care Management Important Message (Signed)
Important Message  Patient Details  Name: Roberta Curtis MRN: [id number] Date of Birth: February 28, 1941   Medicare Important Message Given:  Yes     Juliann Pulse A Elizabella Nolet 02/13/2022, 2:04 PM

## 2022-02-13 NOTE — Progress Notes (Signed)
   02/13/22 2000  Clinical Encounter Type  Visited With Patient  Visit Type Initial  Referral From Nurse  Consult/Referral To Chaplain   Chaplain responded to page by nurse. Nurse expressed that patient would like son added to her advance care paperwork. I explained that the daughter is already listed as HCPOA and they can't have 2 primary just one primary and 1 secondary. When Chaplain went patient room to discuss, patient was sleeping. On call chaplain will follow up.

## 2022-02-13 NOTE — Progress Notes (Signed)
PT Cancellation Note  Patient Details Name: Roberta Curtis MRN: [id number] DOB: 27-Nov-1940   Cancelled Treatment:     PT attempt, pt politely refused. I just got back into bed from recliner." I felt dizzy just sitting there. She requested Pryor Curia return later this afternoon. PT will continue to follow and progress pt as able per current POC.     Willette Pa 02/13/2022, 10:09 AM

## 2022-02-13 NOTE — Progress Notes (Signed)
Neurology progress note  S: Patient had a spell this AM consisting of worsening L sided headache, feeling that she was being pushed to the L, slight L facial droop similar to episode she had on Tues evening. Sx improved after compazine and benadryl. She was slightly lethargic after the benadryl but less so than yesterday.  Data:  LP 02/11/22 OP low at 9 RBC 95, WBC 5, protein 54, glucose 57 Gram stain: WBC seen, no organisms Pending: ENC2 autoimmune encephalopathy panel send-out to [facility], culture Flow cytometry unable to be performed 2/2 insufficient cells Cytology - no e/o malignancy VDRL - NR  MRI brain: NAICP, mild CSVID, no e/o CPA or IAC mass lesion, no abnl contrast enhancement (personal review)  MRI c spine wo 1. 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 2. Left paracentral osseous ridging at C5-C6 contacts the left ventral cord with mild spinal canal stenosis. 3. Residual mild to moderate bilateral neuroforaminal stenosis at C6-C7 due to bony hypertrophy. 4. 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  CTA H&N No emergent large vessel occlusion or high-grade stenosis of the intracranial or cervical arteries.  EEG 6/5 normal awake and asleep  B12 low at 315  O:  Vitals:   02/13/22 1649 02/13/22 1715  BP: (!) 125/47 (!) 143/76  Pulse: 69 66  Resp:    Temp: 97.8 F (36.6 C) 97.7 F (36.5 C)  SpO2: 98% 99%   Physical Exam Gen: alert, O x4, NAD HEENT: Atraumatic, normocephalic;mucous membranes moist; oropharynx clear, tongue without atrophy or fasciculations. Neck: Supple, trachea midline. Resp: CTAB, no w/r/r CV: RRR, no m/g/r; nml S1 and S2. 2+ symmetric peripheral pulses. Abd: soft/NT/ND; nabs x 4 quad Extrem: Nml bulk; no cyanosis, clubbing, or edema.   Neuro: *MS: alert, O x4. Follows multi-step  commands.  *Speech: fluid, mild dysarthria, speech somewhat delayed, able to name and repeat *CN:    I: Deferred   II,III: PERRLA, VFF by confrontation, optic discs unable to be visualized 2/2 pupillary constriction   III,IV,VI: EOMI w/o nystagmus, no ptosis   V: Impaired to LT in L V2   VII: Eyelid closure was full.  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  No tremor, rigidity or bradykinesia. No pronator drift.     Strength: Dlt Bic Tri WrE WrF FgS Gr HF KnF KnE PlF DoF    Left 4 4+ 4- 4 4+ 4 4+ 4 4+ 5 4+ 4+    Right 5 5 4+ '5 5 5 5 5 5 5 5 5      ' *Sensory: Impaired to LT in LUE and LLE. Symmetric. Propioception intact bilat.  No double-simultaneous extinction.  *Coordination:  FNF with past-pointing bilat but no frank ataxia *Reflexes:  3+ and symmetric throughout with 2-3 beats of clonus bilat; toes down-going bilat *Gait: deferred today  A/P: 81 yo woman with remote hx AML s/p BMT in 2007 f/b oncology outpatient with annual bone marrow biopsies, osteopenia, HL, vertigo, anxiety, cervical spine trauma with instrumentation who presented  to ED on 02/08/22 for progressive neurologic sx x6 mos. The sx fluctuate but never completely resolve. Constellation of sx includes L sided weakness and numbness (face/arm/leg; progressive), vertigo, new onset headache (progressing in severity and frequency, now daily, worsens with valsalva), subjective memory impairment, vertigo, fullness and discomfort in L ear, multiple falls. The headache that worsens with valsalva is concerning for increased intracranial pressure, and several of her sx could localize to the skull base or to  the L IAC. However MRI brain wwo contrast incl IACs and thin cuts through the skull base was unremarkable and opening pressure on LP was 9. She has previous c spine trauma with instrumentation however MRI c spine was stable and showed no significant canal stenosis or cord compression to explain her unilateral L weakness and numbness. CTA to r/o vertebrobasilar insufficiency causing vertigo was negative.  Patient reported today that she has been taking advil, tylenol, or both every day since her headache began approx 6 mos ago. 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 We started topiramate 23m qhs as a daily preventive with plan to wean abortives over the next few weeks but she was unable to tolerate topiramate 2/2 lethargy. I suspect that a lot of her sx may be 2/2 persistent daily headache w/ MOH, although this would not explain the L sided weakness and sensory deficits, which will need to be further evaluated as an outpatient with EMG/NCS (not available in-house).  The plan was to keep her in the hospital until CSF labs (cytology and VDRL) result with the exception [facility] (this test takes 2-3 wks to result and may be followed up outpatient). We have now received results for all CSF studies except ENC2 which were unremarkable including cell count (negligibly elevated protein), glucose, gram stain, prelim culture (NGTD x2 days), VDRL NR, cytology no e/o malignancy). Flow cytometry was unable to be performed 2/2 insufficent cells.   Dr. ASheppard Coiland I met jointly with patient and daughter today for 30 minutes and summarized the workup to date and explained that we both felt that we had ruled out the most dangerous things that could be causing her sx and also had performed as much workup as can be accomplished inpatient. I do think she would benefit from EMG/NCS and close outpatient f/u with both neurology and ENT for further  workup. Daughter and patient are tentatively amenable to discharge tomorrow if home health and close f/u can be arranged. Will f/u with them again in AM.   Re[facility]ations: [facility] (this test takes 2-3 wks to result and will be followed up outpatient) - B12 supp IM 10064m q 3 days x5 doses f/b weekly after that x5 wks then recheck level in clinic - PT/OT - [facility]. EMG/NCS may also be helpful to further evaluate her L sided weakness and sensory deficits (this is not available inpatient). - Close outpatient f/u with ENT - Tentative plan for discharge tomorrow; will f/u with family and Dr. AlSheppard Coiln AM  CoSu MonksMD [facility] [phone] [phone] page neurology on call as listed in [facility]

## 2022-02-14 LAB — CSF CULTURE W GRAM STAIN: Culture: NO GROWTH

## 2022-02-14 MED ORDER — PROCHLORPERAZINE MALEATE 10 MG PO TABS: 5.0000 mg | ORAL_TABLET | Freq: Three times a day (TID) | ORAL | 1 refills | Status: DC | PRN

## 2022-02-14 MED ORDER — METHOCARBAMOL 500 MG PO TABS: 500.0000 mg | ORAL_TABLET | Freq: Three times a day (TID) | ORAL | 0 refills | Status: DC | PRN

## 2022-02-14 MED ORDER — CYANOCOBALAMIN 1000 MCG/ML IJ SOLN: INTRAMUSCULAR | 0 refills | Status: DC

## 2022-02-14 MED ORDER — PROCHLORPERAZINE MALEATE 5 MG PO TABS
5.0000 mg | ORAL_TABLET | Freq: Once | ORAL | Status: DC
  Filled 2022-02-14: qty 1

## 2022-02-14 MED ORDER — METHOCARBAMOL 500 MG PO TABS: 500.0000 mg | ORAL_TABLET | Freq: Three times a day (TID) | ORAL | Status: DC | PRN

## 2022-02-14 NOTE — Discharge Summary (Signed)
Physician Discharge Summary   Patient: Roberta Curtis [facility] MRN: [id number]  DOB: [birth year]   Admit:     Date of Admission: 02/08/2022 Admitted from: home   Discharge: Date of discharge: 02/14/22 Disposition: Home health Condition at discharge: good  CODE STATUS: FULL   Diet recommendation: Cardiac diet   Discharge Physician: Emeterio Reeve, DO [facility]     PCP: Lesleigh Noe, MD  Recommendations for Outpatient Follow-up:  Follow up with PCP Lesleigh Noe, MD in 2 weeks Please obtain labs/tests: CBC, BMP in 2 weeks Please follow up on the following pending results: see below - following w/ [facility]  Follow w/ ENT as directed by their office   Printed for patient on AVS:   "Follow w/ neurology as directed. Awaiting results from spinal fluid, this will take several weeks. Planning for further workup with other testing in clinic per neurology.   You have been referred to Dr. Delfino Lovett with [facility]. Their office will contact you for an appointment. If you have not heard from the office within 3 days, please contact [phone].  Please f/u with ENT that you have already been referred to.  These episodes may continue intermittently. These are not strokes or seizures, and as long as they are resolving on their own and/or are similar to what you have been experiencing, there is not concern for life-threatening problem. However, should you notice any severe symptoms outside of what you have been experiencing, if episode not resolving/improving on its own, or if you have any other concerns, please seek emergency medical attention as needed.   Supplementing with B12 as per list below. If you miss exact dates of injections, that is not too concerning as long as you are more or less on a weekly schedule."       Brief Narrative / Hospital Course:  Roberta Curtis is a 81 year old female with history of hyperlipidemia, GERD, anxiety, history of acute  left otitis media (01/19/22), vertigo, who presented emergency department 02/08/2022 for chief concerns of left-sided weakness, left-sided facial droop upon waking up in the morning.  They reported several month history intermittent but progressive numbness/tingling on left side of face, left upper & lower extremity, facial droop on left with drooling.  She also has had positional dizziness and sensation of "fullness" in her head that was also reported as positional and worse with valsalva (during BM's).  Usually feels better when laying down. 02/08/2022: vitals notable for uncontrolled BP up to 191/92 initially.  Labs notable for mild anemia, Cl 114, glucose 101. CT head without contrast read as: Decreased attenuation within the midbrain, may represent artifact, ischemia or less likely infarct. Admitted w/ concern for TIA. MRI brain without contrast: No acute intracranial process 06/05: Neurology consulted (Dr. Quinn Axe) --> MRI brain w/wo, to proceed w/ imaging C-spine, rheum w/u, LP if needed.  06/06: episode of worsening symptoms in evening, neurology aware.  06/07: LP, plan to keep inpatient pending CSF studies: VDRL, flow cytometry, cytology, ENC2 autoimmune encephalopathy panel send-out to [facility] clinic, culture. Plan to refer to outpatient neurology at [facility] upon hospital discharge per family request. EMG/NCS to further evaluate her L sided weakness and sensory deficits (this is not available inpatient). Topiramate '25mg'$  qhs for headache prophylaxis which may be uptitrated slowly to relief. At that point she should be weaned from all abortives incl advil and tylenol to no more than 2x/wk to eliminate [employer] 06/08: stable, no change. Awaiting full results.  06/09: episode of worsening facial numbness, headache, unsteadiness this morning. Dr Quinn Axe and I were in the room w/ the patient and assisted to bed. Dr. Quinn Axe, with myself and RN present, d/w patient and daughter today re: all available results including labs,  imaging, LP, EEG --> have ruled out acute/life-threatening causes for symptoms and remainder of workup needs to be done outpatient. Precautions were reviewed. Home health in process. PT following. Tentative plan for discharge home tomorrow pending further developments.  06/10: conferred again w/ TOC and neurology to confirm plan for discharge and follow-up.    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 Admitted for evaluation of possible stroke or TIA.  CT head was nonacute.  No stroke on MRI.  Normal vitamin B12 and magnesium levels. No concerns on LP or EEG. EEG normal. MRI brain with without contrast including internal auditory canals also unrevealing. CTA head/neck unrevealing.  Neurology following, outpatient referral has been arranged Further evaluation pending results and clinical course, appreciate neurology input - no unifying diagnosis thus far, LP done 02/11/22 and full results pending w/ send-out labs to [facility] - see neurology notes for full details    Questionable  Vertigo Patient does report positional dizziness and posterior headaches but does not describe room spinning.  Neurologic evaluation is underway as outlined. Compazine as needed, try to limit use  Neurology following, outpatient referral has been arranged   AML (acute myeloid leukemia) in remission ([place]) No acute issues. Outpatient follow-up as appropriate. CSF to send out for paraneoplastic evaluation, reassuring no significant cells on LP, Neurology following, outpatient referral has been arranged   Weakness Normal vitamin B12 and Mg level. Chest x-ray on admission negative. Unclear etiology.  Suspect related to the underlying neurologic complaints. Neurology following B12 supplementation  Neurology following, outpatient referral has been arranged   Headache disorder Start topiramate '25mg'$  qhs for headache prophylaxis, uptitrated slowly to relief --> had to d/c this d/t causing grogginess  Plan to be off all abortives incl advil and tylenol to no more than 2x/wk to eliminate [facility] Neurology following, outpatient referral has been arranged   Neuropathy Manage per neurology pending workup for other symptoms  Iron panel normal Supplementing B12 Neurology following, outpatient referral has been arranged   CKD (chronic kidney disease) stage 2, GFR 60-89 ml/min Renal function stable.   Hypochromic-microcytic anemia Hemoglobin on admission 11.8. Monitor CBC.   Urge incontinence Hold mirabegron   GERD (gastroesophageal reflux disease) Continue PPI   History of anemia Hx iron deficiency and thalassemia minor CBC stable, iron panel no concerns    B12 deficiency B12 supp  IM 1034mg q 3 days x5 doses f/b weekly after that x5 wks then recheck level in clinic Neurology following, outpatient referral has been arranged   Constipation Bowel regimen ordered       Discharge Instructions  Discharge Instructions     Ambulatory referral to Neurology   Complete by: As directed     [facility] will contact you for appointment, if you do not hear from them by mid-week contact [phone]   Diet general   Complete by: As directed    Discharge instructions   Complete by: As directed    Follow w/ neurology as directed. Awaiting results from spinal fluid, this will take several weeks. Planning for further workup with other testing in clinic per neurology.   [facility] Their office will contact you for an appointment. If you have not heard from the office within 3 days, please contact [phone]  Please f/u with ENT that you have already been referred to.  These episodes may continue intermittently. These are not strokes or seizures, and as long as they are resolving on their own and/or are similar to what you have been experiencing, there is not concern for life-threatening problem. However, should you notice any severe symptoms outside of what you have been experiencing, if episode not resolving/improving on its own, or if you have any other concerns, please seek emergency medical attention as needed.   Supplementing with B12 as per list below. If you miss exact dates of injections, that is not too concerning as long as you are more or less on a weekly schedule.   Increase activity slowly   Complete by: As directed        Allergies as of 02/14/2022       Reactions   Jadenu [deferasirox] Other (See Comments)   Elevated Iron, headache and rash   Levofloxacin Other (See Comments)   Ondansetron Hcl Other (See Comments)   Codeine Palpitations   Oxycodone-acetaminophen Palpitations   Penicillins Rash        Medication List     STOP taking these medications    aspirin EC 81 MG tablet   atorvastatin 10 MG tablet Commonly known as: LIPITOR   hydrocortisone 2.5 % cream   ketoconazole 2 % cream Commonly known as: NIZORAL   lidocaine 5 % Commonly known as:  Lidoderm   meclizine 12.5 MG tablet Commonly known as: ANTIVERT   mirabegron ER 50 MG Tb24 tablet Commonly known as: MYRBETRIQ   POTASSIUM PO       TAKE these medications    albuterol 108 (90 Base) MCG/ACT inhaler Commonly known as: Ventolin HFA Inhale 1-2 puffs into the lungs as needed for wheezing or shortness of breath.   Biotin 1 MG Caps Take 1,000 mcg by mouth daily.   calcium carbonate 1250 (500 Ca) MG tablet Commonly known as: OS-CAL - dosed in mg of elemental calcium Take 1 tablet by mouth daily.   cyanocobalamin 1000 MCG/ML injection Commonly known as: (VITAMIN B-12) Inject 1 mL (1000 mcg total) into the muscle on the following dates: 02/16/22, 02/19/22, 02/22/22, then weekly after that for 5 doses on approximately 03/01/22, 03/08/22, 03/15/22, 03/22/22, 03/29/22 Start taking on: February 16, 2022   esomeprazole 20 MG capsule Commonly known as: NEXIUM TAKE 1 CAPSULE (20 MG TOTAL) BY MOUTH DAILY AT 12 NOON.   ibandronate 150 MG tablet Commonly known as: BONIVA TAKE 1 TABLET (  150 MG TOTAL) BY MOUTH EVERY 30 (THIRTY) DAYS.   methocarbamol 500 MG tablet Commonly known as: ROBAXIN Take 1 tablet (500 mg total) by mouth every 8 (eight) hours as needed for muscle spasms. What changed: when to take this   prochlorperazine 10 MG tablet Commonly known as: COMPAZINE Take 0.5-1 tablets (5-10 mg total) by mouth every 8 (eight) hours as needed for nausea or vomiting. What changed:  how much to take when to take this reasons to take this   vitamin D3 50 MCG (2000 UT) Caps Take 4,000 Units by mouth daily.               Durable Medical Equipment  (From admission, onward)           Start     Ordered   02/14/22 0912  For home use only DME Bedside commode  Once       Question:  Patient needs a bedside commode to treat with the following condition  Answer:  Vertigo   02/14/22 0911   02/10/22 0943  For home use only DME 4 wheeled rolling walker with seat  Once        Question:  Patient needs a walker to treat with the following condition  Answer:  Impaired ambulation   02/10/22 0942   02/10/22 0805  For home use only DME Walker rolling  Once       Question Answer Comment  Walker: With [place]   Patient needs a walker to treat with the following condition Unsteady gait      02/10/22 0804            Diet Orders (From admission, onward)     Start     Ordered   02/14/22 0000  Diet general        02/14/22 1211   02/11/22 1220  Diet regular Room service appropriate? Yes; Fluid consistency: Thin  Diet effective now       Question Answer Comment  Room service appropriate? Yes   Fluid consistency: Thin      02/11/22 1219               Allergies  Allergen Reactions   Jadenu [Deferasirox] Other (See Comments)    Elevated Iron, headache and rash   Levofloxacin Other (See Comments)   Ondansetron Hcl Other (See Comments)   Codeine Palpitations   Oxycodone-Acetaminophen Palpitations   Penicillins Rash     Subjective: Patient is feeling well at this time, no significant change in symptoms, no worsening episodes overnight. SHe feels eager to go home. Daughter also present at bedside, no other concerns at this point    Discharge Exam: Vitals:   02/14/22 0445 02/14/22 0835  BP: (!) 124/42 118/65  Pulse: 73 70  Resp: 16 18  Temp: 97.7 F (36.5 C) 98 F (36.7 C)  SpO2: 97% 99%   Vitals:   02/13/22 1715 02/13/22 2035 02/14/22 0445 02/14/22 0835  BP: (!) 143/76 (!) 130/52 (!) 124/42 118/65  Pulse: 66 67 73 70  Resp:  '18 16 18  '$ Temp: 97.7 F (36.5 C) 98.2 F (36.8 C) 97.7 F (36.5 C) 98 F (36.7 C)  TempSrc:      SpO2: 99% 98% 97% 99%  Weight:      Height:        General: Pt is alert, awake, not in acute distress Cardiovascular: RRR, S1/S2 +, no rubs, no gallops Respiratory: CTA bilaterally, no wheezing, no rhonchi Abdominal: Soft,  NT, ND, bowel sounds + Extremities: no edema, no cyanosis     The results  of significant diagnostics from this hospitalization (including imaging, microbiology, ancillary and laboratory) are listed below for reference.     Microbiology: Recent Results (from the past 240 hour(s))  CSF culture w Gram Stain     Status: None   Collection Time: 02/11/22 11:50 AM   Specimen: PATH Cytology CSF; Cerebrospinal Fluid  Result Value Ref Range Status   Specimen Description   Final    CSF Performed at [facility], [address], [place], [place] [address]    Special Requests   Final    NONE Performed at [facility], [place], [place] [address]    Gram Stain WBC SEEN RED BLOOD CELLS NO ORGANISMS SEEN   Final   Culture   Final    NO GROWTH Performed at [facility], [place] [address], [place], [place] [address]    Report Status 02/14/2022 FINAL  Final     Labs: BNP (last 3 results) No results for input(s): "BNP" in the last 8760 hours. Basic Metabolic Panel: Recent Labs  Lab 02/08/22 1613 02/08/22 2145 02/12/22 0616  NA 141  --  136  K 3.9  --  3.9  CL 114*  --  109  CO2 23  --  23  GLUCOSE 101*  --  100*  BUN 14  --  27*  CREATININE 0.76  --  0.70  CALCIUM 8.5*  --  9.6  MG  --  1.9  --     Liver Function Tests: Recent Labs  Lab 02/08/22 1613  AST 24  ALT 12  ALKPHOS 61  BILITOT 0.6  PROT 7.4  ALBUMIN 4.0    No results for input(s): "LIPASE", "AMYLASE" in the last 168 hours. No results for input(s): "AMMONIA" in the last 168 hours. CBC: Recent Labs  Lab 02/08/22 1613 02/12/22 0616  WBC 8.1 7.6  NEUTROABS 5.0  --   HGB 11.8* 11.2*  HCT 37.5 34.7*  MCV 74.6* 72.7*  PLT 229 212    Cardiac Enzymes: No results for input(s): "CKTOTAL", "CKMB", "CKMBINDEX", "TROPONINI" in the last 168 hours. BNP: Invalid input(s): "POCBNP" CBG: Recent Labs  Lab 02/09/22 0728 02/09/22 1139  GLUCAP 94 127*    D-Dimer No results for input(s): "DDIMER" in the last 72 hours. Hgb A1c No results for  input(s): "HGBA1C" in the last 72 hours. Lipid Profile No results for input(s): "CHOL", "HDL", "LDLCALC", "TRIG", "CHOLHDL", "LDLDIRECT" in the last 72 hours. Thyroid function studies No results for input(s): "TSH", "T4TOTAL", "T3FREE", "THYROIDAB" in the last 72 hours.  Invalid input(s): "FREET3" Anemia work up Recent Labs    02/12/22 0616  FERRITIN 45  TIBC 304  IRON 97    Urinalysis    Component Value Date/Time   COLORURINE STRAW (A) 12/20/2021 1300   APPEARANCEUR CLEAR (A) 12/20/2021 1300   APPEARANCEUR Cloudy (A) 11/24/2021 0911   LABSPEC 1.041 (H) 12/20/2021 1300   PHURINE 6.0 12/20/2021 1300   GLUCOSEU NEGATIVE 12/20/2021 1300   HGBUR NEGATIVE 12/20/2021 1300   BILIRUBINUR NEGATIVE 12/20/2021 1300   BILIRUBINUR Negative 11/24/2021 0911   KETONESUR NEGATIVE 12/20/2021 1300   PROTEINUR NEGATIVE 12/20/2021 1300   NITRITE NEGATIVE 12/20/2021 1300   LEUKOCYTESUR NEGATIVE 12/20/2021 1300   Sepsis Labs Recent Labs  Lab 02/08/22 1613 02/12/22 0616  WBC 8.1 7.6    Microbiology Recent Results (from the past 240 hour(s))  CSF  culture w Gram Stain     Status: None   Collection Time: 02/11/22 11:50 AM   Specimen: PATH Cytology CSF; Cerebrospinal Fluid  Result Value Ref Range Status   Specimen Description   Final    CSF Performed at [facility], [address] [place], [place] [address]    Special Requests   Final    NONE Performed at [facility], [place], [place] [address]    Gram Stain WBC SEEN RED BLOOD CELLS NO ORGANISMS SEEN   Final   Culture   Final    NO GROWTH Performed at [facility], [place] [address], [place], [place] [address]    Report Status 02/14/2022 FINAL  Final   Imaging MR BRAIN/IAC W WO CONTRAST  Result Date: 02/09/2022 CLINICAL DATA:  Patient with progressive left sided weakness and numbness, headache worse with valsalva, left facial droop, dysarthria, foggy headedness, vertigo, falls. EXAM:  MRI HEAD WITHOUT AND WITH CONTRAST TECHNIQUE: Multiplanar, multiecho pulse sequences of the brain and surrounding structures were obtained without and with intravenous contrast. CONTRAST:  57m GADAVIST GADOBUTROL 1 MMOL/ML IV SOLN COMPARISON:  MRI of the brain February 08, 2022. FINDINGS: Brain: No acute infarction, hemorrhage, hydrocephalus, extra-axial collection or mass lesion. Few scattered foci of T2 hyperintensity within the white matter of the cerebral hemispheres, nonspecific, most likely related to chronic small vessel ischemia. Mild parenchymal volume. No focus of abnormal contrast enhancement identified. Dedicated imaging through the internal auditory canals demonstrates a normal course of cranial nerves VII and VIII without evidence of a mass or abnormal enhancement. The inner ear structures demonstrate normal signal and morphology bilaterally. No mass is present in the cerebellopontine angles. Vascular: Normal flow voids. Skull and upper cervical spine: No focal marrow lesion identified. Sinuses/Orbits: Negative. Other: None. IMPRESSION: 1. No acute intracranial abnormality. 2. No evidence cerebellopontine angle or internal auditory canal mass lesion or abnormal contrast enhancement. Electronically Signed   By: KPedro EarlsM.D.   On: 02/09/2022 14:21   DG Chest Port 1 View  Result Date: 02/08/2022 CLINICAL DATA:  Left-sided weakness EXAM: PORTABLE CHEST 1 VIEW COMPARISON:  06/12/2021 FINDINGS: Hardware in the cervical spine. Mild diffuse chronic interstitial opacity. No acute consolidation, pleural effusion, or pneumothorax. Stable cardiomediastinal silhouette. IMPRESSION: No active disease. Mild diffuse chronic appearing interstitial opacity Electronically Signed   By: KDonavan FoilM.D.   On: 02/08/2022 21:49   MR BRAIN WO CONTRAST  Result Date: 02/08/2022 CLINICAL DATA:  Left-sided numbness and weakness EXAM: MRI HEAD WITHOUT CONTRAST TECHNIQUE: Multiplanar, multiecho pulse  sequences of the brain and surrounding structures were obtained without intravenous contrast. COMPARISON:  No prior MRI, correlation is made with CT head 02/08/2022 FINDINGS: Brain: No restricted diffusion to suggest acute or subacute infarct. No acute hemorrhage, mass mass effect, or midline shift. No hemosiderin deposition to suggest remote hemorrhage. No hydrocephalus or extra-axial collection. Minimal T2 hyperintense signal in the periventricular white matter, likely the sequela of mild chronic small vessel ischemic disease. The right parietal hypodensity on the prior CT correlates with a possible remote infarct, although this is not associated with T2 hyperintense signal on FLAIR as would be expected with a remote infarct. No correlate is seen for the decreased attenuation in the midbrain on CT. Vascular: Normal arterial flow voids. Skull and upper cervical spine: Normal marrow signal. Sinuses/Orbits: Evaluation of the left maxillary sinus is somewhat limited by susceptibility artifact. Otherwise clear paranasal sinuses. Status post bilateral lens  replacements. Other: Trace fluid in the right mastoid air cells. IMPRESSION: No acute intracranial process. Electronically Signed   By: Merilyn Baba M.D.   On: 02/08/2022 18:08   CT HEAD WO CONTRAST  Result Date: 02/08/2022 CLINICAL DATA:  81 year old female with acute LEFT-sided numbness, weakness and headache. EXAM: CT HEAD WITHOUT CONTRAST TECHNIQUE: Contiguous axial images were obtained from the base of the skull through the vertex without intravenous contrast. RADIATION DOSE REDUCTION: This exam was performed according to the departmental dose-optimization program which includes automated exposure control, adjustment of the mA and/or kV according to patient size and/or use of iterative reconstruction technique. COMPARISON:  None Available. FINDINGS: Brain: Decreased attenuation within the mid brain noted and may represent artifact, ischemia or less likely  infarct. Subcortical white matter a small area of subcortical white matter hypodensity in the RIGHT parietal region (series 2: Images 18-19) No other evidence of acute infarction, hemorrhage, hydrocephalus, extra-axial collection or mass lesion/mass effect. Vascular: Carotid atherosclerotic calcifications are noted. Skull: Normal. Negative for fracture or focal lesion. Sinuses/Orbits: No acute abnormality. Other: None. IMPRESSION: 1. Decreased attenuation within the mid brain, which may represent artifact, ischemia or less likely infarct. Consider MRI for further evaluation as clinically indicated. 2. Small area of nonspecific subcortical white matter hypodensity in the RIGHT parietal region, age indeterminate. Consider MRI for further evaluation. Electronically Signed   By: Margarette Canada M.D.   On: 02/08/2022 16:46      Time coordinating discharge: Over 30 minutes  SIGNED:  Emeterio Reeve DO [facility]

## 2022-02-14 NOTE — Discharge Summary (Deleted)
D/c summary already done, disregard this document

## 2022-02-14 NOTE — TOC Progression Note (Addendum)
Transition of Care [facility]) - Progression Note    Patient Details  Name: Roberta Curtis MRN: [id number] Date of Birth: 03-16-1941  Transition of Care [facility]) CM/SW Contact  Izola Price, RN Phone [phone] 02/14/2022, 9:20 AM  Clinical Narrative:  6/10: Request for DME via [employer] per last note, but not yet delivered as no order was in place?. Order placed by provider and [employer] notified via [place].  Will be delivered to room today for potential discharge today. Also requested [place] orders to be placed as [place] set up by prior CM with [facility]. Simmie Davies RN CM  [place] orders placed and notified [facility] of updated [place] orders and confirmed transportation home via spouse. Simmie Davies RN CM   Expected Discharge Plan: [place] Barriers to Discharge: Continued Medical Work up  Expected Discharge Plan and Services Expected Discharge Plan: [place]   Discharge Planning Services: CM Consult Post Acute Care Choice: Home Health, Durable Medical Equipment Living arrangements for the past 2 months: Single Family Home                 DME Arranged: Walker rolling DME Agency: [facility] Date DME Agency Contacted: 02/09/22 Time DME Agency Contacted: [phone] Representative spoke with at DME Agency: [place]: PT [place]: [place] Date [place]: 02/09/22 Time [place]: [place] Representative spoke with at [place]: [place] ([place]) Interventions    Readmission Risk Interventions     No data to display

## 2022-02-14 NOTE — Discharge Planning (Deleted)
Physician Discharge Summary   Patient: Roberta Curtis [facility] MRN: [id number]  DOB: 1940/11/09   Admit:     Date of Admission: 02/08/2022 Admitted from: home   Discharge: Date of discharge: 02/14/22 Disposition: Home health Condition at discharge: good  CODE STATUS: FULL   Diet recommendation: Cardiac diet   Discharge Physician: Emeterio Reeve, DO [facility]     PCP: Lesleigh Noe, MD  Recommendations for Outpatient Follow-up:  Follow up with PCP Lesleigh Noe, MD in 2 weeks Please obtain labs/tests: CBC, BMP in 2 weeks Please follow up on the following pending results: see below - following w/ [facility]  Follow w/ ENT as directed by their office   Printed for patient on AVS:   "Follow w/ neurology as directed. Awaiting results from spinal fluid, this will take several weeks. Planning for further workup with other testing in clinic per neurology.   You have been referred to Dr. Delfino Lovett with [facility]. Their office will contact you for an appointment. If you have not heard from the office within 3 days, please contact [phone].  Please f/u with ENT that you have already been referred to.  These episodes may continue intermittently. These are not strokes or seizures, and as long as they are resolving on their own and/or are similar to what you have been experiencing, there is not concern for life-threatening problem. However, should you notice any severe symptoms outside of what you have been experiencing, if episode not resolving/improving on its own, or if you have any other concerns, please seek emergency medical attention as needed.   Supplementing with B12 as per list below. If you miss exact dates of injections, that is not too concerning as long as you are more or less on a weekly schedule."       Brief Narrative / Hospital Course:  [name] is a 81 year old female with history of hyperlipidemia, GERD, anxiety, history of acute  left otitis media (01/19/22), vertigo, who presented emergency department 02/08/2022 for chief concerns of left-sided weakness, left-sided facial droop upon waking up in the morning.  They reported several month history intermittent but progressive numbness/tingling on left side of face, left upper & lower extremity, facial droop on left with drooling.  She also has had positional dizziness and sensation of "fullness" in her head that was also reported as positional and worse with valsalva (during BM's).  Usually feels better when laying down. 02/08/2022: vitals notable for uncontrolled BP up to 191/92 initially.  Labs notable for mild anemia, Cl 114, glucose 101. CT head without contrast read as: Decreased attenuation within the midbrain, may represent artifact, ischemia or less likely infarct. Admitted w/ concern for TIA. MRI brain without contrast: No acute intracranial process 06/05: Neurology consulted (Dr. Quinn Axe) --> MRI brain w/wo, to proceed w/ imaging C-spine, rheum w/u, LP if needed.  06/06: episode of worsening symptoms in evening, neurology aware.  06/07: LP, plan to keep inpatient pending CSF studies: VDRL, flow cytometry, cytology, ENC2 autoimmune encephalopathy panel send-out to [facility] clinic, culture. Plan to refer to outpatient neurology at [facility] upon hospital discharge per family request. EMG/NCS to further evaluate her L sided weakness and sensory deficits (this is not available inpatient). Topiramate '25mg'$  qhs for headache prophylaxis which may be uptitrated slowly to relief. At that point she should be weaned from all abortives incl advil and tylenol to no more than 2x/wk to eliminate [facility] 06/08: stable, no change. Awaiting full results.  06/09: episode of worsening facial numbness, headache, unsteadiness this morning. Dr Quinn Axe and I were in the room w/ the patient and assisted to bed. Dr. Quinn Axe, with myself and RN present, d/w patient and daughter today re: all available results including labs,  imaging, LP, EEG --> have ruled out acute/life-threatening causes for symptoms and remainder of workup needs to be done outpatient. Precautions were reviewed. Home health in process. PT following. Tentative plan for discharge home tomorrow pending further developments.  06/10: conferred again w/ TOC and neurology to confirm plan for discharge and follow-up.    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 Admitted for evaluation of possible stroke or TIA.  CT head was nonacute.  No stroke on MRI.  Normal vitamin B12 and magnesium levels. No concerns on LP or EEG. EEG normal. MRI brain with without contrast including internal auditory canals also unrevealing. CTA head/neck unrevealing.  Neurology following, outpatient referral has been arranged Further evaluation pending results and clinical course, appreciate neurology input - no unifying diagnosis thus far, LP done 02/11/22 and full results pending w/ send-out labs to [facility] - see neurology notes for full details    Questionable  Vertigo Patient does report positional dizziness and posterior headaches but does not describe room spinning.  Neurologic evaluation is underway as outlined. Compazine as needed, try to limit use  Neurology following, outpatient referral has been arranged   AML (acute myeloid leukemia) in remission ([place]) No acute issues. Outpatient follow-up as appropriate. CSF to send out for paraneoplastic evaluation, reassuring no significant cells on LP, Neurology following, outpatient referral has been arranged   Weakness Normal vitamin B12 and Mg level. Chest x-ray on admission negative. Unclear etiology.  Suspect related to the underlying neurologic complaints. Neurology following B12 supplementation  Neurology following, outpatient referral has been arranged   Headache disorder Start topiramate '25mg'$  qhs for headache prophylaxis, uptitrated slowly to relief --> had to d/c this d/t causing grogginess  Plan to be off all abortives incl advil and tylenol to no more than 2x/wk to eliminate [facility] Neurology following, outpatient referral has been arranged   Neuropathy Manage per neurology pending workup for other symptoms  Iron panel normal Supplementing B12 Neurology following, outpatient referral has been arranged   CKD (chronic kidney disease) stage 2, GFR 60-89 ml/min Renal function stable.   Hypochromic-microcytic anemia Hemoglobin on admission 11.8. Monitor CBC.   Urge incontinence Hold mirabegron   GERD (gastroesophageal reflux disease) Continue PPI   History of anemia Hx iron deficiency and thalassemia minor CBC stable, iron panel no concerns    B12 deficiency B12 supp  IM 1043mg q 3 days x5 doses f/b weekly after that x5 wks then recheck level in clinic Neurology following, outpatient referral has been arranged   Constipation Bowel regimen ordered       Discharge Instructions  Discharge Instructions     Ambulatory referral to Neurology   Complete by: As directed     [facility] will contact you for appointment, if you do not hear from them by mid-week contact [phone]   Diet general   Complete by: As directed    Discharge instructions   Complete by: As directed    Follow w/ neurology as directed. Awaiting results from spinal fluid, this will take several weeks. Planning for further workup with other testing in clinic per neurology.   [facility] Their office will contact you for an appointment. If you have not heard from the office within 3 days, please contact [phone]  Please f/u with ENT that you have already been referred to.  These episodes may continue intermittently. These are not strokes or seizures, and as long as they are resolving on their own and/or are similar to what you have been experiencing, there is not concern for life-threatening problem. However, should you notice any severe symptoms outside of what you have been experiencing, if episode not resolving/improving on its own, or if you have any other concerns, please seek emergency medical attention as needed.   Supplementing with B12 as per list below. If you miss exact dates of injections, that is not too concerning as long as you are more or less on a weekly schedule.   Increase activity slowly   Complete by: As directed        Allergies as of 02/14/2022       Reactions   Jadenu [deferasirox] Other (See Comments)   Elevated Iron, headache and rash   Levofloxacin Other (See Comments)   Ondansetron Hcl Other (See Comments)   Codeine Palpitations   Oxycodone-acetaminophen Palpitations   Penicillins Rash        Medication List     STOP taking these medications    aspirin EC 81 MG tablet   atorvastatin 10 MG tablet Commonly known as: LIPITOR   hydrocortisone 2.5 % cream   ketoconazole 2 % cream Commonly known as: NIZORAL   lidocaine 5 % Commonly known as:  Lidoderm   meclizine 12.5 MG tablet Commonly known as: ANTIVERT   mirabegron ER 50 MG Tb24 tablet Commonly known as: MYRBETRIQ   POTASSIUM PO       TAKE these medications    albuterol 108 (90 Base) MCG/ACT inhaler Commonly known as: Ventolin HFA Inhale 1-2 puffs into the lungs as needed for wheezing or shortness of breath.   Biotin 1 MG Caps Take 1,000 mcg by mouth daily.   calcium carbonate 1250 (500 Ca) MG tablet Commonly known as: OS-CAL - dosed in mg of elemental calcium Take 1 tablet by mouth daily.   cyanocobalamin 1000 MCG/ML injection Commonly known as: (VITAMIN B-12) Inject 1 mL (1000 mcg total) into the muscle on the following dates: 02/16/22, 02/19/22, 02/22/22, then weekly after that for 5 doses on approximately 03/01/22, 03/08/22, 03/15/22, 03/22/22, 03/29/22 Start taking on: February 16, 2022   esomeprazole 20 MG capsule Commonly known as: NEXIUM TAKE 1 CAPSULE (20 MG TOTAL) BY MOUTH DAILY AT 12 NOON.   ibandronate 150 MG tablet Commonly known as: BONIVA TAKE 1 TABLET (  150 MG TOTAL) BY MOUTH EVERY 30 (THIRTY) DAYS.   methocarbamol 500 MG tablet Commonly known as: ROBAXIN Take 1 tablet (500 mg total) by mouth every 8 (eight) hours as needed for muscle spasms. What changed: when to take this   prochlorperazine 10 MG tablet Commonly known as: COMPAZINE Take 1 tablet (10 mg total) by mouth daily.   vitamin D3 50 MCG (2000 UT) Caps Take 4,000 Units by mouth daily.               Durable Medical Equipment  (From admission, onward)           Start     Ordered   02/14/22 0912  For home use only DME Bedside commode  Once       Question:  Patient needs a bedside commode to treat with the following condition  Answer:  Vertigo   02/14/22 0911   02/10/22 0943  For home use only DME 4 wheeled rolling walker with seat  Once       Question:  Patient needs a walker to treat with the following condition  Answer:  Impaired ambulation   02/10/22 0942    02/10/22 0805  For home use only DME Walker rolling  Once       Question Answer Comment  Walker: With [place]   Patient needs a walker to treat with the following condition Unsteady gait      02/10/22 0804            Diet Orders (From admission, onward)     Start     Ordered   02/14/22 0000  Diet general        02/14/22 1211   02/11/22 1220  Diet regular Room service appropriate? Yes; Fluid consistency: Thin  Diet effective now       Question Answer Comment  Room service appropriate? Yes   Fluid consistency: Thin      02/11/22 1219               Allergies  Allergen Reactions   Jadenu [Deferasirox] Other (See Comments)    Elevated Iron, headache and rash   Levofloxacin Other (See Comments)   Ondansetron Hcl Other (See Comments)   Codeine Palpitations   Oxycodone-Acetaminophen Palpitations   Penicillins Rash     Subjective: Patient is feeling well at this time, no significant change in symptoms, no worsening episodes overnight. SHe feels eager to go home. Daughter also present at bedside, no other concerns at this point    Discharge Exam: Vitals:   02/14/22 0445 02/14/22 0835  BP: (!) 124/42 118/65  Pulse: 73 70  Resp: 16 18  Temp: 97.7 F (36.5 C) 98 F (36.7 C)  SpO2: 97% 99%   Vitals:   02/13/22 1715 02/13/22 2035 02/14/22 0445 02/14/22 0835  BP: (!) 143/76 (!) 130/52 (!) 124/42 118/65  Pulse: 66 67 73 70  Resp:  '18 16 18  '$ Temp: 97.7 F (36.5 C) 98.2 F (36.8 C) 97.7 F (36.5 C) 98 F (36.7 C)  TempSrc:      SpO2: 99% 98% 97% 99%  Weight:      Height:        General: Pt is alert, awake, not in acute distress Cardiovascular: RRR, S1/S2 +, no rubs, no gallops Respiratory: CTA bilaterally, no wheezing, no rhonchi Abdominal: Soft, NT, ND, bowel sounds + Extremities: no edema, no cyanosis     The results of significant diagnostics from this hospitalization (including imaging,  microbiology, ancillary and laboratory) are listed  below for reference.     Microbiology: Recent Results (from the past 240 hour(s))  CSF culture w Gram Stain     Status: None   Collection Time: 02/11/22 11:50 AM   Specimen: PATH Cytology CSF; Cerebrospinal Fluid  Result Value Ref Range Status   Specimen Description   Final    CSF Performed at [facility], [address] [place], [place] [address]    Special Requests   Final    NONE Performed at [facility], [place], [place] [address]    Gram Stain WBC SEEN RED BLOOD CELLS NO ORGANISMS SEEN   Final   Culture   Final    NO GROWTH Performed at [facility], [place] [address] [place], [place] [address]    Report Status 02/14/2022 FINAL  Final     Labs: BNP (last 3 results) No results for input(s): "BNP" in the last 8760 hours. Basic Metabolic Panel: Recent Labs  Lab 02/08/22 1613 02/08/22 2145 02/12/22 0616  NA 141  --  136  K 3.9  --  3.9  CL 114*  --  109  CO2 23  --  23  GLUCOSE 101*  --  100*  BUN 14  --  27*  CREATININE 0.76  --  0.70  CALCIUM 8.5*  --  9.6  MG  --  1.9  --    Liver Function Tests: Recent Labs  Lab 02/08/22 1613  AST 24  ALT 12  ALKPHOS 61  BILITOT 0.6  PROT 7.4  ALBUMIN 4.0   No results for input(s): "LIPASE", "AMYLASE" in the last 168 hours. No results for input(s): "AMMONIA" in the last 168 hours. CBC: Recent Labs  Lab 02/08/22 1613 02/12/22 0616  WBC 8.1 7.6  NEUTROABS 5.0  --   HGB 11.8* 11.2*  HCT 37.5 34.7*  MCV 74.6* 72.7*  PLT 229 212   Cardiac Enzymes: No results for input(s): "CKTOTAL", "CKMB", "CKMBINDEX", "TROPONINI" in the last 168 hours. BNP: Invalid input(s): "POCBNP" CBG: Recent Labs  Lab 02/09/22 0728 02/09/22 1139  GLUCAP 94 127*   D-Dimer No results for input(s): "DDIMER" in the last 72 hours. Hgb A1c No results for input(s): "HGBA1C" in the last 72 hours. Lipid Profile No results for input(s): "CHOL", "HDL", "LDLCALC", "TRIG", "CHOLHDL",  "LDLDIRECT" in the last 72 hours. Thyroid function studies No results for input(s): "TSH", "T4TOTAL", "T3FREE", "THYROIDAB" in the last 72 hours.  Invalid input(s): "FREET3" Anemia work up Recent Labs    02/12/22 0616  FERRITIN 45  TIBC 304  IRON 97   Urinalysis    Component Value Date/Time   COLORURINE STRAW (A) 12/20/2021 1300   APPEARANCEUR CLEAR (A) 12/20/2021 1300   APPEARANCEUR Cloudy (A) 11/24/2021 0911   LABSPEC 1.041 (H) 12/20/2021 1300   PHURINE 6.0 12/20/2021 1300   GLUCOSEU NEGATIVE 12/20/2021 1300   HGBUR NEGATIVE 12/20/2021 1300   BILIRUBINUR NEGATIVE 12/20/2021 1300   BILIRUBINUR Negative 11/24/2021 0911   KETONESUR NEGATIVE 12/20/2021 1300   PROTEINUR NEGATIVE 12/20/2021 1300   NITRITE NEGATIVE 12/20/2021 1300   LEUKOCYTESUR NEGATIVE 12/20/2021 1300   Sepsis Labs Recent Labs  Lab 02/08/22 1613 02/12/22 0616  WBC 8.1 7.6   Microbiology Recent Results (from the past 240 hour(s))  CSF culture w Gram Stain     Status: None   Collection Time: 02/11/22 11:50 AM   Specimen: PATH Cytology CSF; Cerebrospinal Fluid  Result Value Ref Range  Status   Specimen Description   Final    CSF Performed at [facility], [place] [place], [place] [address]    Special Requests   Final    NONE Performed at [facility], [place], [place] [address]    Gram Stain WBC SEEN RED BLOOD CELLS NO ORGANISMS SEEN   Final   Culture   Final    NO GROWTH Performed at [facility], [place] [address], [place], [place] [address]    Report Status 02/14/2022 FINAL  Final   Imaging MR BRAIN/IAC W WO CONTRAST  Result Date: 02/09/2022 CLINICAL DATA:  Patient with progressive left sided weakness and numbness, headache worse with valsalva, left facial droop, dysarthria, foggy headedness, vertigo, falls. EXAM: MRI HEAD WITHOUT AND WITH CONTRAST TECHNIQUE: Multiplanar, multiecho pulse sequences of the brain and surrounding structures were  obtained without and with intravenous contrast. CONTRAST:  38m GADAVIST GADOBUTROL 1 MMOL/ML IV SOLN COMPARISON:  MRI of the brain February 08, 2022. FINDINGS: Brain: No acute infarction, hemorrhage, hydrocephalus, extra-axial collection or mass lesion. Few scattered foci of T2 hyperintensity within the white matter of the cerebral hemispheres, nonspecific, most likely related to chronic small vessel ischemia. Mild parenchymal volume. No focus of abnormal contrast enhancement identified. Dedicated imaging through the internal auditory canals demonstrates a normal course of cranial nerves VII and VIII without evidence of a mass or abnormal enhancement. The inner ear structures demonstrate normal signal and morphology bilaterally. No mass is present in the cerebellopontine angles. Vascular: Normal flow voids. Skull and upper cervical spine: No focal marrow lesion identified. Sinuses/Orbits: Negative. Other: None. IMPRESSION: 1. No acute intracranial abnormality. 2. No evidence cerebellopontine angle or internal auditory canal mass lesion or abnormal contrast enhancement. Electronically Signed   By: KPedro EarlsM.D.   On: 02/09/2022 14:21   DG Chest Port 1 View  Result Date: 02/08/2022 CLINICAL DATA:  Left-sided weakness EXAM: PORTABLE CHEST 1 VIEW COMPARISON:  06/12/2021 FINDINGS: Hardware in the cervical spine. Mild diffuse chronic interstitial opacity. No acute consolidation, pleural effusion, or pneumothorax. Stable cardiomediastinal silhouette. IMPRESSION: No active disease. Mild diffuse chronic appearing interstitial opacity Electronically Signed   By: KDonavan FoilM.D.   On: 02/08/2022 21:49   MR BRAIN WO CONTRAST  Result Date: 02/08/2022 CLINICAL DATA:  Left-sided numbness and weakness EXAM: MRI HEAD WITHOUT CONTRAST TECHNIQUE: Multiplanar, multiecho pulse sequences of the brain and surrounding structures were obtained without intravenous contrast. COMPARISON:  No prior MRI, correlation is  made with CT head 02/08/2022 FINDINGS: Brain: No restricted diffusion to suggest acute or subacute infarct. No acute hemorrhage, mass mass effect, or midline shift. No hemosiderin deposition to suggest remote hemorrhage. No hydrocephalus or extra-axial collection. Minimal T2 hyperintense signal in the periventricular white matter, likely the sequela of mild chronic small vessel ischemic disease. The right parietal hypodensity on the prior CT correlates with a possible remote infarct, although this is not associated with T2 hyperintense signal on FLAIR as would be expected with a remote infarct. No correlate is seen for the decreased attenuation in the midbrain on CT. Vascular: Normal arterial flow voids. Skull and upper cervical spine: Normal marrow signal. Sinuses/Orbits: Evaluation of the left maxillary sinus is somewhat limited by susceptibility artifact. Otherwise clear paranasal sinuses. Status post bilateral lens replacements. Other: Trace fluid in the right mastoid air cells. IMPRESSION: No acute intracranial process. Electronically Signed   By: AMerilyn BabaM.D.   On: 02/08/2022 18:08  CT HEAD WO CONTRAST  Result Date: 02/08/2022 CLINICAL DATA:  81 year old female with acute LEFT-sided numbness, weakness and headache. EXAM: CT HEAD WITHOUT CONTRAST TECHNIQUE: Contiguous axial images were obtained from the base of the skull through the vertex without intravenous contrast. RADIATION DOSE REDUCTION: This exam was performed according to the departmental dose-optimization program which includes automated exposure control, adjustment of the mA and/or kV according to patient size and/or use of iterative reconstruction technique. COMPARISON:  None Available. FINDINGS: Brain: Decreased attenuation within the mid brain noted and may represent artifact, ischemia or less likely infarct. Subcortical white matter a small area of subcortical white matter hypodensity in the RIGHT parietal region (series 2: Images  18-19) No other evidence of acute infarction, hemorrhage, hydrocephalus, extra-axial collection or mass lesion/mass effect. Vascular: Carotid atherosclerotic calcifications are noted. Skull: Normal. Negative for fracture or focal lesion. Sinuses/Orbits: No acute abnormality. Other: None. IMPRESSION: 1. Decreased attenuation within the mid brain, which may represent artifact, ischemia or less likely infarct. Consider MRI for further evaluation as clinically indicated. 2. Small area of nonspecific subcortical white matter hypodensity in the RIGHT parietal region, age indeterminate. Consider MRI for further evaluation. Electronically Signed   By: Margarette Canada M.D.   On: 02/08/2022 16:46      Time coordinating discharge: Over 30 minutes  SIGNED:  Emeterio Reeve DO [facility]

## 2022-02-16 ENCOUNTER — Ambulatory Visit: Payer: Medicare HMO

## 2022-02-16 ENCOUNTER — Telehealth: Payer: Self-pay

## 2022-02-16 NOTE — Telephone Encounter (Signed)
Will see at f/u

## 2022-02-16 NOTE — Telephone Encounter (Signed)
Transition Care Management Follow-up Telephone Call Date of discharge and from where: TCM DC [facility] 02-14-22 Dx: stroke like symptoms How have you been since you were released from the hospital? Doing ok  Any questions or concerns? No  Items Reviewed: Did the pt receive and understand the discharge instructions provided? Yes  Medications obtained and verified? Yes  Other? No  Any new allergies since your discharge? No  Dietary orders reviewed? Yes Do you have support at home? Yes   Home Care and Equipment/Supplies: Were home health services ordered? Yes PT If so, what is the name of the agency? unsure Has the agency set up a time to come to the patient's home? yes Were any new equipment or medical supplies ordered?  Yes- walker [facility] What is the name of the medical supply agency? Hospital  Were you able to get the supplies/equipment? yes Do you have any questions related to the use of the equipment or supplies? No  Functional Questionnaire: (I = Independent and D = Dependent) ADLs: I  Bathing/Dressing- I  Meal Prep- I  Eating- I  Maintaining continence- I  Transferring/Ambulation- I- WALKER  Managing Meds- I  Follow up appointments reviewed:  PCP Hospital f/u appt confirmed? Yes  Scheduled to see Dr Einar Pheasant on 02-24-22 @ [facility] f/u appt confirmed? No  . Are transportation arrangements needed? No  If their condition worsens, is the pt aware to call PCP or go to the Emergency Dept.? Yes Was the patient provided with contact information for the PCP's office or ED? Yes Was to pt encouraged to call back with questions or concerns? Yes

## 2022-02-17 ENCOUNTER — Telehealth: Payer: Self-pay | Admitting: Family Medicine

## 2022-02-17 NOTE — Telephone Encounter (Signed)
Mickel Baas from [facility] called and states that hh evaluation was completed and wants to order PT 2 Xweek for 2 weeks, 1 xweek for 2 weeks and then 1 x every other week for 4 weeks. They will send over plan of care after receiving verbal. . Ok to leave verbal on voicemail.

## 2022-02-17 NOTE — Telephone Encounter (Signed)
Verbal orders, per Dr. Einar Pheasant, left on Laura's VM.

## 2022-02-17 NOTE — Telephone Encounter (Signed)
Agree with HH orders as requested 

## 2022-02-19 ENCOUNTER — Ambulatory Visit: Payer: Medicare HMO

## 2022-02-23 ENCOUNTER — Encounter: Payer: Medicare HMO | Admitting: Family Medicine

## 2022-02-24 ENCOUNTER — Ambulatory Visit (INDEPENDENT_AMBULATORY_CARE_PROVIDER_SITE_OTHER): Payer: Medicare HMO | Admitting: Family Medicine

## 2022-02-24 ENCOUNTER — Ambulatory Visit: Payer: Medicare HMO | Admitting: Family Medicine

## 2022-02-24 VITALS — BP 110/80 | HR 77 | Temp 97.6°F | Ht 61.0 in | Wt 136.0 lb

## 2022-02-24 DIAGNOSIS — E538 Deficiency of other specified B group vitamins: Secondary | ICD-10-CM | POA: Diagnosis not present

## 2022-02-24 DIAGNOSIS — K219 Gastro-esophageal reflux disease without esophagitis: Secondary | ICD-10-CM | POA: Diagnosis not present

## 2022-02-24 DIAGNOSIS — R42 Dizziness and giddiness: Secondary | ICD-10-CM

## 2022-02-24 DIAGNOSIS — R29818 Other symptoms and signs involving the nervous system: Secondary | ICD-10-CM

## 2022-02-24 DIAGNOSIS — I700 Atherosclerosis of aorta: Secondary | ICD-10-CM | POA: Diagnosis not present

## 2022-02-24 DIAGNOSIS — R519 Headache, unspecified: Secondary | ICD-10-CM

## 2022-02-24 DIAGNOSIS — C9201 Acute myeloblastic leukemia, in remission: Secondary | ICD-10-CM | POA: Diagnosis not present

## 2022-02-24 DIAGNOSIS — D649 Anemia, unspecified: Secondary | ICD-10-CM | POA: Diagnosis not present

## 2022-02-24 LAB — CBC
HCT: 37.9 % (ref 36.0–46.0)
Hemoglobin: 11.8 g/dL — ABNORMAL LOW (ref 12.0–15.0)
MCHC: 31.2 g/dL (ref 30.0–36.0)
MCV: 75.5 fl — ABNORMAL LOW (ref 78.0–100.0)
Platelets: 268 10*3/uL (ref 150.0–400.0)
RBC: 5.03 Mil/uL (ref 3.87–5.11)
RDW: 14.9 % (ref 11.5–15.5)
WBC: 6.9 10*3/uL (ref 4.0–10.5)

## 2022-02-24 LAB — BASIC METABOLIC PANEL
BUN: 13 mg/dL (ref 6–23)
CO2: 27 mEq/L (ref 19–32)
Calcium: 9.5 mg/dL (ref 8.4–10.5)
Chloride: 107 mEq/L (ref 96–112)
Creatinine, Ser: 0.86 mg/dL (ref 0.40–1.20)
GFR: 64 mL/min (ref >60.00)
Glucose, Bld: 104 mg/dL — ABNORMAL HIGH (ref 70–99)
Potassium: 4.3 mEq/L (ref 3.5–5.1)
Sodium: 140 mEq/L (ref 135–145)

## 2022-02-24 MED ORDER — CYANOCOBALAMIN 1000 MCG/ML IJ SOLN
1000.0000 ug | Freq: Once | INTRAMUSCULAR | Status: AC
  Administered 2022-02-24: 1000 ug via INTRAMUSCULAR

## 2022-02-24 NOTE — Assessment & Plan Note (Signed)
Etiology not entirely clear, suspect it may be multifactorial.  She did have left-sided weakness stroke was ruled out.  Still waiting on some CSF testing.  Advised scheduling ear nose and throat and neurology follow-up.  Is a question of whether or not it was vertigo she has taken meclizine twice with some improvement.  Also encouraged the importance of hydration, she reports she does not drink any water or fluids this morning.  No additional tingling numbness or weakness since discharge from the hospital

## 2022-02-24 NOTE — Progress Notes (Signed)
Subjective:     Roberta Curtis is a 81 y.o. female presenting for Hospitalization Follow-up and B12 Injection     HPI  #lightheadedness - husband brought her to the appointment - home for 10 days - has been doing pretty good - no tingling, numbness, weakness - has some lightheadedness - when she went to the ER was dizzy and nauseous  - was stuttering in the hospital - daughters came for support  Headaches have improved  #GERD - took tums x 1  - did not get her nexium in the hospital - took compazine as needed  Did not have a good experience in the hospital - was bad   Still has weakness on the left side Got a 4 post cane - but feels more steady without the cane   Review of Systems  6/4-6/06/2022: Admission - left side weakness/facial droop - r/o CVA and TIA, normal EEG, MRI brain normal. LP on 02/11/2022 (results pending). Vertigo? - prn compazine, outpt ENT. AML - CSF sent off. Weakness - Vit B12. HA - topiramate 25 mg nightly could not tolerate, stop all medicaiton x 2 weeks (medication overuse).    Social History   Tobacco Use  Smoking Status Former   Packs/day: 1.00   Years: 6.00   Total pack years: 6.00   Types: Cigarettes   Quit date: 02/20/1970   Years since quitting: 52.0  Smokeless Tobacco Never        Objective:    BP Readings from Last 3 Encounters:  02/24/22 110/80  02/14/22 118/65  01/19/22 138/70   Wt Readings from Last 3 Encounters:  02/24/22 136 lb (61.7 kg)  02/08/22 138 lb 10.7 oz (62.9 kg)  01/19/22 137 lb 8 oz (62.4 kg)    BP 110/80   Pulse 77   Temp 97.6 F (36.4 C) (Temporal)   Ht '5\' 1"'$  (1.549 m)   Wt 136 lb (61.7 kg)   SpO2 100%   BMI 25.70 kg/m    Physical Exam Constitutional:      General: She is not in acute distress.    Appearance: She is well-developed. She is not diaphoretic.  HENT:     Right Ear: External ear normal.     Left Ear: External ear normal.     Nose: Nose normal.  Eyes:      Conjunctiva/sclera: Conjunctivae normal.  Cardiovascular:     Rate and Rhythm: Normal rate and regular rhythm.     Heart sounds: Murmur heard.  Pulmonary:     Effort: Pulmonary effort is normal. No respiratory distress.     Breath sounds: Normal breath sounds. No wheezing.  Musculoskeletal:     Cervical back: Neck supple.  Skin:    General: Skin is warm and dry.     Capillary Refill: Capillary refill takes less than 2 seconds.  Neurological:     Mental Status: She is alert. Mental status is at baseline.  Psychiatric:        Mood and Affect: Mood is anxious.        Behavior: Behavior normal.           Assessment & Plan:   Problem List Items Addressed This Visit       Cardiovascular and Mediastinum   Aortic 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[place])    She is currently on aspirin 81 mg.  Information about atorvastatin provided she will check with her family.        Digestive   GERD (gastroesophageal reflux disease)  She notes she was not given Nexium while in the hospital, denies any acid reflux symptoms.  Remain off of Nexium restart only if symptoms recur.      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  CSF done which shows no recurrence of AML.  Remains in remission.  Continue oncology follow-up as needed.      Relevant Medications   meclizine (ANTIVERT) 12.5 MG tablet   Dizziness - Primary    Etiology not entirely clear, suspect it may be multifactorial.  She did have left-sided weakness stroke was ruled out.  Still waiting on some CSF testing.  Advised scheduling ear nose and throat and neurology follow-up.  Is a question of whether or not it was vertigo she has taken meclizine twice with some improvement.  Also encouraged the importance of hydration, she reports she does not drink any water or fluids this morning.  No additional tingling numbness or  weakness since discharge from the hospital      Relevant Orders   Basic metabolic panel   Neurological deficit present    She still has left-sided weakness.  She does feel this is improving, she is working with home health PT.  Discussed if home health is not going as she would like, we could do an outpatient referral.  At this time she is relying on family members to drive her so home health is preferred.      Vitamin B12 deficiency    B12 injection given today.  Discussed that patient can continue getting once a month B12 injections with our office.      Relevant Orders   CBC   Headache disorder    She denies any current headaches, these have improved.      Anemia    She is on B12 injections, her iron level was normal.  Repeat labs today      Relevant Orders   CBC     Return in about 4 weeks (around 03/24/2022).  Lesleigh Noe, MD

## 2022-02-24 NOTE — Assessment & Plan Note (Signed)
She still has left-sided weakness.  She does feel this is improving, she is working with home health PT.  Discussed if home health is not going as she would like, we could do an outpatient referral.  At this time she is relying on family members to drive her so home health is preferred.

## 2022-02-24 NOTE — Assessment & Plan Note (Signed)
She denies any current headaches, these have improved.

## 2022-02-24 NOTE — Assessment & Plan Note (Signed)
B12 injection given today.  Discussed that patient can continue getting once a month B12 injections with our office.

## 2022-02-24 NOTE — Assessment & Plan Note (Signed)
She is currently on aspirin 81 mg.  Information about atorvastatin provided she will check with her family.

## 2022-02-24 NOTE — Assessment & Plan Note (Signed)
Recently hospitalized with strokelike symptoms.  CSF done which shows no recurrence of AML.  Remains in remission.  Continue oncology follow-up as needed.

## 2022-02-24 NOTE — Assessment & Plan Note (Signed)
She notes she was not given Nexium while in the hospital, denies any acid reflux symptoms.  Remain off of Nexium restart only if symptoms recur.

## 2022-02-24 NOTE — Assessment & Plan Note (Signed)
She is on B12 injections, her iron level was normal.  Repeat labs today

## 2022-02-24 NOTE — Patient Instructions (Addendum)
Schedule follow-up with ENT and neurology  Labs are still pending   You have some plaque build up on your aorta (Aortic Atherosclerosis) - I would recommend baby aspirin and cholesterol medication (like Atorvastatin) to reduce your risk for heart attack and stroke  Drink at least 60 oz of water per day

## 2022-02-25 ENCOUNTER — Other Ambulatory Visit: Payer: Self-pay | Admitting: Family

## 2022-02-25 DIAGNOSIS — K219 Gastro-esophageal reflux disease without esophagitis: Secondary | ICD-10-CM

## 2022-02-25 NOTE — Telephone Encounter (Signed)
Patient no longer needing

## 2022-02-27 ENCOUNTER — Ambulatory Visit: Payer: Medicare HMO | Admitting: Family Medicine

## 2022-02-27 LAB — MISC LABCORP TEST (SEND OUT): Labcorp test code: 9985

## 2022-03-01 ENCOUNTER — Other Ambulatory Visit: Payer: Self-pay | Admitting: Neurology

## 2022-03-02 ENCOUNTER — Telehealth: Payer: Self-pay | Admitting: Family Medicine

## 2022-03-02 NOTE — Telephone Encounter (Signed)
Spoke to patient to schedule Medicare Annual Wellness Visit (AWV) either virtually or phone  Pt did not want to schedule at this time  wanted a call back in aug   Last AWV  02/18/21    I left my direct # [phone]

## 2022-03-03 ENCOUNTER — Other Ambulatory Visit: Payer: Self-pay | Admitting: Family Medicine

## 2022-03-03 DIAGNOSIS — M8580 Other specified disorders of bone density and structure, unspecified site: Secondary | ICD-10-CM

## 2022-03-06 DIAGNOSIS — R42 Dizziness and giddiness: Secondary | ICD-10-CM

## 2022-03-06 DIAGNOSIS — Z9181 History of falling: Secondary | ICD-10-CM

## 2022-03-06 DIAGNOSIS — R531 Weakness: Secondary | ICD-10-CM

## 2022-03-06 DIAGNOSIS — D509 Iron deficiency anemia, unspecified: Secondary | ICD-10-CM

## 2022-03-06 DIAGNOSIS — R29810 Facial weakness: Secondary | ICD-10-CM

## 2022-03-06 DIAGNOSIS — R030 Elevated blood-pressure reading, without diagnosis of hypertension: Secondary | ICD-10-CM

## 2022-03-06 DIAGNOSIS — Z87891 Personal history of nicotine dependence: Secondary | ICD-10-CM

## 2022-03-06 DIAGNOSIS — R011 Cardiac murmur, unspecified: Secondary | ICD-10-CM

## 2022-03-06 DIAGNOSIS — E785 Hyperlipidemia, unspecified: Secondary | ICD-10-CM

## 2022-03-06 DIAGNOSIS — N182 Chronic kidney disease, stage 2 (mild): Secondary | ICD-10-CM

## 2022-03-06 DIAGNOSIS — G459 Transient cerebral ischemic attack, unspecified: Secondary | ICD-10-CM

## 2022-03-06 DIAGNOSIS — K219 Gastro-esophageal reflux disease without esophagitis: Secondary | ICD-10-CM

## 2022-03-06 DIAGNOSIS — N3941 Urge incontinence: Secondary | ICD-10-CM

## 2022-03-06 DIAGNOSIS — M8580 Other specified disorders of bone density and structure, unspecified site: Secondary | ICD-10-CM

## 2022-03-06 DIAGNOSIS — Z90710 Acquired absence of both cervix and uterus: Secondary | ICD-10-CM

## 2022-03-06 DIAGNOSIS — C9201 Acute myeloblastic leukemia, in remission: Secondary | ICD-10-CM

## 2022-03-06 DIAGNOSIS — G629 Polyneuropathy, unspecified: Secondary | ICD-10-CM

## 2022-03-12 ENCOUNTER — Telehealth: Payer: Self-pay

## 2022-03-12 NOTE — Telephone Encounter (Signed)
Home Health verbal orders  Agency Name: [facility]  Requesting PT  Frequency: once a week for 5 weeks.  Please forward to [facility] pool or providers CMA

## 2022-03-12 NOTE — Telephone Encounter (Signed)
Approved.  

## 2022-03-12 NOTE — Telephone Encounter (Signed)
Verbal orders given to Laura. 

## 2022-03-13 ENCOUNTER — Telehealth: Payer: Self-pay | Admitting: Family Medicine

## 2022-03-13 NOTE — Telephone Encounter (Signed)
Called and spoke w/ HHN at [facility] to countuire home PT  gave verbal okay.

## 2022-03-13 NOTE — Telephone Encounter (Signed)
[place] Name: [place] Agency Name: [facility] Phone #: [phone] Direct Number Service Requested: PT (examples: OT/PT/Skilled Nursing/Social Work/Speech Therapy/Wound Care) Frequency of Visits: 1 week 4

## 2022-03-20 ENCOUNTER — Other Ambulatory Visit: Payer: Self-pay | Admitting: Family Medicine

## 2022-03-23 ENCOUNTER — Ambulatory Visit (INDEPENDENT_AMBULATORY_CARE_PROVIDER_SITE_OTHER): Payer: Medicare HMO | Admitting: Family Medicine

## 2022-03-23 VITALS — BP 120/80 | HR 72 | Temp 97.8°F | Ht 61.0 in | Wt 138.2 lb

## 2022-03-23 DIAGNOSIS — K5900 Constipation, unspecified: Secondary | ICD-10-CM | POA: Diagnosis not present

## 2022-03-23 DIAGNOSIS — R42 Dizziness and giddiness: Secondary | ICD-10-CM | POA: Diagnosis not present

## 2022-03-23 DIAGNOSIS — K219 Gastro-esophageal reflux disease without esophagitis: Secondary | ICD-10-CM | POA: Diagnosis not present

## 2022-03-23 MED ORDER — ESOMEPRAZOLE MAGNESIUM 20 MG PO CPDR: 20.0000 mg | DELAYED_RELEASE_CAPSULE | Freq: Every day | ORAL | 1 refills | Status: DC

## 2022-03-23 NOTE — Patient Instructions (Signed)
#  Heartburn - restart Esomeprazole 20 mg - update if no improvement in 1 week   #constipation - try taking 1 prune daily - Drink at least 50 oz of hydrating liquid (caffeine free beverages daily) - Gatorade, juice, water  Constipation   Constipation is a common issue. Often it is related to diet and occasionally medications.    What you can do to treat your symptoms 1) Fiber -- Eat more fiber rich foods: beans, broccoli, berries, avocados, popcorn, pear/apple, green peas, turnip greens, brussels sprouts, whole grains (barley, bran, quinoa, oatmeal) -- Take a Fiber supplement: Psyllium (Metamucil)  -- Could also eat Prunes daily  2) Hydration  -- Drink more water: Try to drink 64 oz of water per day  3) Exercise -- Moderate exercise (walking, jogging, biking) for 30 minutes, 5 days a week  4) Dedicate time for Bowel movements - do not delay  5) Stool Softener  - Docusate Sodium (Colace) 100 mg daily or twice daily as needed   If 4-6 weeks have passed and the above has not helped then start the following 6) Laxatives -- Polyethylene Glycol (Miralax) - begin with once daily. After a few days can increase to twice daily Or -- Magnesium Citrate -- Common side effect is nausea and diarrhea -- can try if still not improved

## 2022-03-23 NOTE — Assessment & Plan Note (Signed)
She has ENT follow-up next month.  Neurology appointment is not until January, however, work-up in the ER in hospital was negative.  Appreciate ENT support.  Do think she may still be dealing with some dehydration related dizziness.  Long discussion regarding importance of hydration and drinking at least 50 ounces per day.

## 2022-03-23 NOTE — Assessment & Plan Note (Signed)
Suspect this flareup is in setting of stopping her Nexium in the hospital.  Restart Nexium, update in 2 weeks if not improving will consider increased dose and GI follow-up if no symptom resolution.  Notes history of an ulcer.  She is also on Boniva which could be contributing.

## 2022-03-23 NOTE — Progress Notes (Signed)
Subjective:     Roberta Curtis is a 81 y.o. female presenting for Follow-up (4 week )     HPI  #Heart burn - carrying around Tums - stopped her omeprazole - is on boniva  #Constipation - is eating prunes to help - will get dizzy when she has a sensation of needing to use the bathroom - is not doing great drinking water  Has an appointment with ENT in August to check in Neuro is not until Jan 8th, 2024  Review of Systems  02/24/22: Hospital f/u - Dizziness/weakness - needs ENT/Neuro - hydration. Vit b12 deficiency  Social History   Tobacco Use  Smoking Status Former   Packs/day: 1.00   Years: 6.00   Total pack years: 6.00   Types: Cigarettes   Quit date: 02/20/1970   Years since quitting: 52.1  Smokeless Tobacco Never        Objective:    BP Readings from Last 3 Encounters:  03/23/22 120/80  02/24/22 110/80  02/14/22 118/65   Wt Readings from Last 3 Encounters:  03/23/22 138 lb 4 oz (62.7 kg)  02/24/22 136 lb (61.7 kg)  02/08/22 138 lb 10.7 oz (62.9 kg)    BP 120/80   Pulse 72   Temp 97.8 F (36.6 C) (Temporal)   Ht '5\' 1"'$  (1.549 m)   Wt 138 lb 4 oz (62.7 kg)   SpO2 99%   BMI 26.12 kg/m    Physical Exam Constitutional:      General: She is not in acute distress.    Appearance: She is well-developed. She is not diaphoretic.  HENT:     Right Ear: External ear normal.     Left Ear: External ear normal.     Nose: Nose normal.  Eyes:     Conjunctiva/sclera: Conjunctivae normal.  Cardiovascular:     Rate and Rhythm: Normal rate and regular rhythm.  Pulmonary:     Effort: Pulmonary effort is normal. No respiratory distress.     Breath sounds: Normal breath sounds. No wheezing.  Abdominal:     General: Abdomen is flat. Bowel sounds are normal. There is no distension.     Palpations: Abdomen is soft.     Tenderness: There is no abdominal tenderness.  Musculoskeletal:     Cervical back: Neck supple.  Skin:    General: Skin is warm and  dry.     Capillary Refill: Capillary refill takes less than 2 seconds.  Neurological:     Mental Status: She is alert. Mental status is at baseline.  Psychiatric:        Mood and Affect: Mood normal.        Behavior: Behavior normal.           Assessment & Plan:   Problem List Items Addressed This Visit       Digestive   GERD (gastroesophageal reflux disease) - Primary    Suspect this flareup is in setting of stopping her Nexium in the hospital.  Restart Nexium, update in 2 weeks if not improving will consider increased dose and GI follow-up if no symptom resolution.  Notes history of an ulcer.  She is also on Boniva which could be contributing.      Relevant Medications   esomeprazole (NEXIUM) 20 MG capsule     Other   Dizziness    She has ENT follow-up next month.  Neurology appointment is not until January, however, work-up in the ER in hospital was negative.  Appreciate ENT support.  Do think she may still be dealing with some dehydration related dizziness.  Long discussion regarding importance of hydration and drinking at least 50 ounces per day.      Constipation    She notes some dizziness when she has a sensation for a bowel movement, and that this resolves after BM.  Advised trying prunes once every day.  And increasing water intake.  Update if no improvement        Return if symptoms worsen or fail to improve.  Lesleigh Noe, MD

## 2022-03-23 NOTE — Assessment & Plan Note (Signed)
She notes some dizziness when she has a sensation for a bowel movement, and that this resolves after BM.  Advised trying prunes once every day.  And increasing water intake.  Update if no improvement

## 2022-03-26 ENCOUNTER — Ambulatory Visit (INDEPENDENT_AMBULATORY_CARE_PROVIDER_SITE_OTHER): Payer: Medicare HMO | Admitting: *Deleted

## 2022-03-26 DIAGNOSIS — E538 Deficiency of other specified B group vitamins: Secondary | ICD-10-CM

## 2022-03-26 MED ORDER — CYANOCOBALAMIN 1000 MCG/ML IJ SOLN
1000.0000 ug | Freq: Once | INTRAMUSCULAR | Status: AC
  Administered 2022-03-26: 1000 ug via INTRAMUSCULAR

## 2022-03-26 NOTE — Progress Notes (Signed)
Per orders of Dr. Einar Pheasant, injection of Vitamin B12 given in right upper outer quadrant by Lauralyn Primes.  Patient receives these monthly. Patient tolerated injection well.

## 2022-04-07 ENCOUNTER — Inpatient Hospital Stay: Payer: Medicare HMO | Attending: Oncology

## 2022-04-07 ENCOUNTER — Inpatient Hospital Stay (HOSPITAL_BASED_OUTPATIENT_CLINIC_OR_DEPARTMENT_OTHER): Payer: Medicare HMO | Admitting: Oncology

## 2022-04-07 ENCOUNTER — Other Ambulatory Visit: Payer: Self-pay

## 2022-04-07 ENCOUNTER — Encounter: Payer: Self-pay | Admitting: Oncology

## 2022-04-07 VITALS — BP 130/54 | HR 72 | Temp 99.0°F | Ht 61.0 in | Wt 141.0 lb

## 2022-04-07 DIAGNOSIS — Z148 Genetic carrier of other disease: Secondary | ICD-10-CM | POA: Insufficient documentation

## 2022-04-07 DIAGNOSIS — B372 Candidiasis of skin and nail: Secondary | ICD-10-CM

## 2022-04-07 DIAGNOSIS — Z9481 Bone marrow transplant status: Secondary | ICD-10-CM | POA: Insufficient documentation

## 2022-04-07 DIAGNOSIS — K5909 Other constipation: Secondary | ICD-10-CM

## 2022-04-07 DIAGNOSIS — Z79899 Other long term (current) drug therapy: Secondary | ICD-10-CM | POA: Diagnosis not present

## 2022-04-07 DIAGNOSIS — C9201 Acute myeloblastic leukemia, in remission: Secondary | ICD-10-CM

## 2022-04-07 DIAGNOSIS — G459 Transient cerebral ischemic attack, unspecified: Secondary | ICD-10-CM

## 2022-04-07 DIAGNOSIS — Z856 Personal history of leukemia: Secondary | ICD-10-CM

## 2022-04-07 LAB — CBC WITH DIFFERENTIAL/PLATELET
Abs Immature Granulocytes: 0.02 10*3/uL (ref 0.00–0.07)
Basophils Absolute: 0.1 10*3/uL (ref 0.0–0.1)
Basophils Relative: 1 %
Eosinophils Absolute: 0.1 10*3/uL (ref 0.0–0.5)
Eosinophils Relative: 2 %
HCT: 36.6 % (ref 36.0–46.0)
Hemoglobin: 11.7 g/dL — ABNORMAL LOW (ref 12.0–15.0)
Immature Granulocytes: 0 %
Lymphocytes Relative: 27 %
Lymphs Abs: 2.2 10*3/uL (ref 0.7–4.0)
MCH: 23.9 pg — ABNORMAL LOW (ref 26.0–34.0)
MCHC: 32 g/dL (ref 30.0–36.0)
MCV: 74.7 fL — ABNORMAL LOW (ref 80.0–100.0)
Monocytes Absolute: 0.9 10*3/uL (ref 0.1–1.0)
Monocytes Relative: 11 %
Neutro Abs: 5 10*3/uL (ref 1.7–7.7)
Neutrophils Relative %: 59 %
Platelets: 218 10*3/uL (ref 150–400)
RBC: 4.9 MIL/uL (ref 3.87–5.11)
RDW: 15.7 % — ABNORMAL HIGH (ref 11.5–15.5)
WBC: 8.3 10*3/uL (ref 4.0–10.5)
nRBC: 0 % (ref 0.0–0.2)

## 2022-04-07 LAB — COMPREHENSIVE METABOLIC PANEL
ALT: 15 U/L (ref 0–44)
AST: 20 U/L (ref 15–41)
Albumin: 3.9 g/dL (ref 3.5–5.0)
Alkaline Phosphatase: 58 U/L (ref 38–126)
Anion gap: 6 (ref 5–15)
BUN: 18 mg/dL (ref 8–23)
CO2: 28 mmol/L (ref 22–32)
Calcium: 9.7 mg/dL (ref 8.9–10.3)
Chloride: 105 mmol/L (ref 98–111)
Creatinine, Ser: 0.75 mg/dL (ref 0.44–1.00)
GFR, Estimated: >60 mL/min (ref >60)
Glucose, Bld: 104 mg/dL — ABNORMAL HIGH (ref 70–99)
Potassium: 3.9 mmol/L (ref 3.5–5.1)
Sodium: 139 mmol/L (ref 135–145)
Total Bilirubin: 0.6 mg/dL (ref 0.3–1.2)
Total Protein: 7 g/dL (ref 6.5–8.1)

## 2022-04-07 LAB — TSH: TSH: 1.866 u[IU]/mL (ref 0.350–4.500)

## 2022-04-07 LAB — FERRITIN: Ferritin: 36 ng/mL (ref 11–307)

## 2022-04-07 LAB — IRON AND TIBC
Iron: 116 ug/dL (ref 28–170)
Saturation Ratios: 31 % (ref 10.4–31.8)
TIBC: 372 ug/dL (ref 250–450)
UIBC: 256 ug/dL

## 2022-04-07 LAB — LACTATE DEHYDROGENASE: LDH: 146 U/L (ref 98–192)

## 2022-04-07 NOTE — Assessment & Plan Note (Signed)
Follow-up with neurology 

## 2022-04-07 NOTE — Assessment & Plan Note (Deleted)
Recommend otc clotrimazole cream

## 2022-04-07 NOTE — Assessment & Plan Note (Signed)
H63D.  Ferritin level is stable at 36, iron saturation 31. TIBC 372 No LFT abnormalities.  Continue monitor.

## 2022-04-07 NOTE — Progress Notes (Signed)
Hematology/Oncology Progress note [phone] [phone]      Patient Care Team: Lesleigh Noe, MD as PCP - General (Family Medicine) Placido Sou (Oncology) [facility] (Cardiology) [facility] Earlie Server, MD as Consulting Physician (Hematology and Oncology)  ASSESSMENT & PLAN:   AML (acute myeloid leukemia) in remission [facility]) #History of AML in 2007, status post bone marrow transplant. Labs reviewed and discussed with patient.  Negative peripheral flow cytometry. Jan 2023 Bone marrow biopsy results were reviewed and discussed with patient. No leukemia, lymphoma or high-grade dysplasia.  Cytogenetics were normal. 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  If still negative, we will stop repeating annual marrow biopsy.  She agrees with the plan.  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  Ferritin level is stable at 36, iron saturation 31. TIBC 372 No LFT abnormalities.  Continue monitor.   Questionable TIA (transient ischemic attack) Follow up with neurology  Orders Placed This Encounter  Procedures   TSH    Standing Status:   Future    Number of Occurrences:   1    Standing Expiration Date:   04/08/2023   Comprehensive metabolic panel    Standing Status:   Future    Standing Expiration Date:   04/08/2023   Ferritin    Standing Status:   Future    Standing Expiration Date:   04/08/2023   Lactate dehydrogenase    Standing Status:   Future    Standing Expiration Date:   04/08/2023   Bone marrow biopsy in 6 months.  Follow up after that.  All questions were answered. The patient knows to call the clinic with any problems, questions or concerns.  Earlie Server, MD, PhD [facility] Hematology Oncology 04/07/2022   CHIEF COMPLAINTS/REASON FOR  VISIT:   history of AML status post bone marrow transplant, hemochromatosis carrier  HISTORY OF PRESENTING ILLNESS:   Roberta Curtis is a  81 y.o.  female with PMH listed below was seen in consultation at the request of  Lesleigh Noe, MD to establish care for  history of AML status post bone marrow transplant, hemochromatosis carrier. Patient moved from [place] to [place] in February 2021. She brought some of her oncology records. 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  Patient received induction daunorubincin and cytarabin.  04/18/2006- axilla mass exicision - microabscess with gram positive cocci.   04/19/2006, bone marrow biopsy showed 50% cellularity, chemotherapeutic effect, day 14.  Persistent leukemia given persistent  CD117 cells, CD34 positive cells are slightly increased. 04/28/2006, bone marrow biopsy showed 50 to 75% cellularity with immature infiltrates, mild maturation present, markedly decreased erythroid elements with dyserythropoiesis, marked megakaryocytosis with dysplastic morphology, increased iron stores consistent with acute myeloid leukemia with multilineage dysplastic 05/28/2006 per note,  bone marrow which showed AML in remission 08/18/2006-admitted for bone marrow transplant, received busulfan and etoposide.  Received washed autologous peripheral blood stem cells on 08/27/2006.  Engraftment was noted on day 13-09/09/2006  Previously followed up with  oncologist and has been having bone marrow biopsy surveillance annually and she  has stayed in remission. .  Her last Bone marrow biopsy was done in 2019.  05/26/2018 Bone marrow biopsy showed normocellular marrow with adequate trilineage hematopoiesis, no morphology evidence of increase blasts, consistent with AML in remission.  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  Detail of mutation is unknown. She reports that she has had phlebotomy in the past. Reports she has had right axillary lymph node biopsy and was told by her doctors not to have blood drawn on the right upper extremity due to risk of lymphedema.  Patient tells me that her husband passed away from [facility] many years ago. Patient reports feeling very well today.Denies  fever, chills, fatigue, night sweats.  She does not eat very well and this has been a chronic issue for her.     INTERVAL HISTORY Roberta Curtis is a 81 y.o. female who has above history reviewed by me today presents for follow up visit for history of AML Patient reports feeling well.  Denies any constitutional symptoms. During the interval she was hospitalized due to left side weakness, left facial droop. She was noticed to have uncontrolled BP. Admitted due to concern of TIA. MRI brain without contrast: CTA head/neck unrevealing.  No acute intracranial process.  No concerns on LP or EEG. She follows up with neurology.  She feels that her left side weakness is improving.     Review of Systems  Constitutional:  Negative for appetite change, chills, fatigue and fever.  HENT:   Negative for hearing loss and voice change.   Eyes:  Negative for eye problems.  Respiratory:  Negative for chest tightness and cough.   Cardiovascular:  Negative for chest pain.  Gastrointestinal:  Negative for abdominal distention, abdominal pain and blood in stool.  Endocrine: Negative for hot flashes.  Genitourinary:  Negative for difficulty  urinating and frequency.   Musculoskeletal:  Negative for arthralgias.  Skin:  Negative for itching and rash.  Neurological:  Negative for extremity weakness.       Left side weakness  Hematological:  Negative for adenopathy.  Psychiatric/Behavioral:  Negative for confusion.     MEDICAL HISTORY:  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HCC)    GERD (gastroesophageal reflux disease)    Heart murmur    Osteopenia    Thrush     SURGICAL HISTORY: Past Surgical History:  Procedure Laterality Date   BONE MARROW TRANSPLANT     BREAST LUMPECTOMY Left    fatty tumor,1970's   CATARACT EXTRACTION     CESAREAN SECTION     HYSTEROTOMY     LYMPH GLAND EXCISION     MENISCUS REPAIR Right    NECK SURGERY     SALPINGECTOMY     WISDOM TOOTH EXTRACTION      SOCIAL HISTORY: Social History   Socioeconomic History   Marital status: Widowed    Spouse name: Alvis Lemmings   Number of children: 3   Years of education: high school   Highest education level: Not on file  Occupational History   Not on file  Tobacco Use   Smoking status: Former    Packs/day: 1.00    Years: 6.00    Total pack years: 6.00    Types: Cigarettes    Quit date: 02/20/1970  Years since quitting: 52.1   Smokeless tobacco: Never  Vaping Use   Vaping Use: Never used  Substance and Sexual Activity   Alcohol use: Not Currently   Drug use: Never   Sexual activity: Not Currently    Partners: Male    Birth control/protection: Post-menopausal  Other Topics Concern   Not on file  Social History Narrative      Right handed   Lives in a condo with husband   From [place]      02/12/20   From: [place], moved [place] be near [place]: with Alvis Lemmings - husband   Health Care Proxy: Teresita Madura (daughter)   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      Family: Product/process development 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, lives in [place]), [place] ([place], [place]), [place] ([place])       Enjoys: meet new people, socialable      Exercise: not currently - cleaning   Diet: picky eater - eats veggies - but her husband doesn't like these things      Safety   Seat belts: Yes    Guns: Yes  and secure   Safe in relationships: Yes    Social Determinants of Health   Financial Resource Strain: Low Risk  (02/18/2021)   Overall Financial Resource Strain (CARDIA)    Difficulty of Paying Living Expenses: Not hard at all  Food Insecurity: No Food Insecurity (02/18/2021)   Hunger Vital Sign    Worried About Running Out of Food in the Last Year: Never true    [place] in the Last Year: Never true  Transportation Needs: No Transportation Needs (02/18/2021)   PRAPARE - Hydrologist (Medical): No    Lack of Transportation (Non-Medical): No  Physical Activity: Inactive (02/18/2021)   Exercise Vital Sign    Days of Exercise per Week: 0 days    Minutes of Exercise per Session: 0 min  Stress: No Stress Concern Present (02/18/2021)   [place]    Feeling of Stress : Not at all  Social Connections: Not on file  Intimate Partner Violence: Not At Risk (02/18/2021)   Humiliation, Afraid, Rape, and Kick questionnaire    Fear of Current or Ex-Partner: No    Emotionally Abused: No    Physically Abused: No    Sexually Abused: No    FAMILY HISTORY: Family History  Problem Relation Age of Onset   Retinitis pigmentosa Mother    Stroke Mother    Liver cancer Father    Heart disease Father    Heart disease Brother    Blindness Brother     ALLERGIES:  is allergic to jadenu [deferasirox], levofloxacin, ondansetron hcl, codeine, oxycodone-acetaminophen, and penicillins.  MEDICATIONS:  Current Outpatient Medications  Medication Sig Dispense Refill   albuterol (VENTOLIN HFA) 108 (90 Base) MCG/ACT inhaler Inhale 1-2 puffs into the lungs as needed for wheezing or shortness of breath. 6.7 g 1    aspirin EC 81 MG tablet Take 81 mg by mouth daily. Swallow whole.     Biotin 1 MG CAPS Take 1,000 mcg by mouth daily.      calcium carbonate (OS-CAL - DOSED IN MG OF ELEMENTAL CALCIUM) 1250 (500 Ca) MG tablet Take 1 tablet by mouth daily.     Cholecalciferol (VITAMIN D3) 50 MCG (2000 UT) CAPS Take 4,000 Units by mouth daily.     cyanocobalamin (,VITAMIN B-12,) 1000 MCG/ML injection  Inject 1 mL (1000 mcg total) into the muscle on the following dates: 02/16/22, 02/19/22, 02/22/22, then weekly after that for 5 doses on approximately 03/01/22, 03/08/22, 03/15/22, 03/22/22, 03/29/22 8 mL 0   esomeprazole (NEXIUM) 20 MG capsule Take 1 capsule (20 mg total) by mouth daily at 12 noon. 90 capsule 1   ibandronate (BONIVA) 150 MG tablet TAKE 1 TABLET (150 MG TOTAL) BY MOUTH EVERY 30 (THIRTY) DAYS. 3 tablet 1   meclizine (ANTIVERT) 12.5 MG tablet Take 12.5 mg by mouth 3 (three) times daily as needed.     methocarbamol (ROBAXIN) 500 MG tablet Take 1 tablet (500 mg total) by mouth every 8 (eight) hours as needed for muscle spasms. 30 tablet 0   No current facility-administered medications for this visit.     PHYSICAL EXAMINATION: ECOG PERFORMANCE STATUS: 0 - Asymptomatic Vitals:   04/07/22 1302  BP: (!) 130/54  Pulse: 72  Temp: 99 F (37.2 C)   Filed Weights   04/07/22 1302  Weight: 141 lb (64 kg)    Physical Exam Constitutional:      General: She is not in acute distress. HENT:     Head: Normocephalic and atraumatic.  Eyes:     General: No scleral icterus. Cardiovascular:     Rate and Rhythm: Normal rate and regular rhythm.     Heart sounds: Normal heart sounds.  Pulmonary:     Effort: Pulmonary effort is normal. No respiratory distress.     Breath sounds: No wheezing.  Abdominal:     General: Bowel sounds are normal. There is no distension.     Palpations: Abdomen is soft.  Musculoskeletal:        General: No deformity. Normal range of motion.     Cervical back: Normal range of  motion and neck supple.  Skin:    General: Skin is warm and dry.     Findings: No erythema or rash.  Neurological:     Mental Status: She is alert and oriented to person, place, and time. Mental status is at baseline.     Cranial Nerves: No cranial nerve deficit.     Coordination: Coordination normal.  Psychiatric:        Mood and Affect: Mood normal.     LABORATORY DATA:  I have reviewed the data as listed     Latest Ref Rng & Units 04/07/2022   12:43 PM 02/24/2022   12:37 PM 02/12/2022    6:16 AM  CBC  WBC 4.0 - 10.5 K/uL 8.3  6.9  7.6   Hemoglobin 12.0 - 15.0 g/dL 11.7  11.8  11.2   Hematocrit 36.0 - 46.0 % 36.6  37.9  34.7   Platelets 150 - 400 K/uL 218  268.0  212       Latest Ref Rng & Units 04/07/2022   12:43 PM 02/24/2022   12:37 PM 02/12/2022    6:16 AM  CMP  Glucose 70 - 99 mg/dL 104  104  100   BUN 8 - 23 mg/dL '18  13  27   ' Creatinine 0.44 - 1.00 mg/dL 0.75  0.86  0.70   Sodium 135 - 145 mmol/L 139  140  136   Potassium 3.5 - 5.1 mmol/L 3.9  4.3  3.9   Chloride 98 - 111 mmol/L 105  107  109   CO2 22 - 32 mmol/L '28  27  23   ' Calcium 8.9 - 10.3 mg/dL 9.7  9.5  9.6   Total  Protein 6.5 - 8.1 g/dL 7.0     Total Bilirubin 0.3 - 1.2 mg/dL 0.6     Alkaline Phos 38 - 126 U/L 58     AST 15 - 41 U/L 20     ALT 0 - 44 U/L 15       Iron/TIBC/Ferritin/ %Sat    Component Value Date/Time   IRON 116 04/07/2022 1243   IRON 108 08/06/2019 0000   TIBC 372 04/07/2022 1243   TIBC 324 08/06/2019 0000   FERRITIN 36 04/07/2022 1243   FERRITIN 74 08/06/2019 0000   IRONPCTSAT 31 04/07/2022 1243   IRONPCTSAT 33 08/06/2019 0000      RADIOGRAPHIC STUDIES: I have personally reviewed the radiological images as listed and agreed with the findings in the report. DG FL GUIDED LUMBAR PUNCTURE  Result Date: 02/11/2022 CLINICAL DATA:  Patient with new complaints of dizziness, headache and left-sided weakness and numbness request received for diagnostic lumbar puncture. EXAM: DIAGNOSTIC  LUMBAR PUNCTURE UNDER FLUOROSCOPIC GUIDANCE COMPARISON:  CT head performed 02/08/2022 and MR brain performed 02/09/2022 were reviewed prior to the procedure. FLUOROSCOPY TIME:  Radiation Exposure Index (if provided by the fluoroscopic device): 7.50 mGy PROCEDURE: Informed consent was obtained from the patient prior to the procedure, including potential complications of bleeding, infection, paresthesias, nerve damage, CSF leak requiring additional procedures, post procedure requirement to lay flat for several hours after the procedure, headache, allergy, and pain. With the patient prone, the lower back was prepped with Betadine. 1% Lidocaine was used for local anesthesia. Lumbar puncture was performed at the L4-L5 level using a 20 gauge needle with return of colorless CSF with an opening pressure of 9 cm water. 10 ml of CSF were obtained for laboratory studies. A closing pressure of 9 cm of water. The inner stylet was placed back into the needle and the needle was removed in its entirety. The patient tolerated the procedure well and there were no apparent complications. A sterile bandage was applied. IMPRESSION: Technically successful fluoroscopic guided lumbar puncture. This exam was performed by Tsosie Billing PA-C, and was supervised and interpreted by Dr. Anselm Pancoast. Electronically Signed   By: Markus Daft M.D.   On: 02/11/2022 12:38   MR CERVICAL SPINE WO CONTRAST  Result Date: 02/10/2022 CLINICAL DATA:  Left-sided weakness and numbness. History of prior cervical spine trauma and fusion. EXAM: MRI CERVICAL SPINE WITHOUT CONTRAST TECHNIQUE: Multiplanar, multisequence MR imaging of the cervical spine was performed. No intravenous contrast was administered. COMPARISON:  CTA head and neck from same day. FINDINGS: Alignment: Physiologic. Vertebrae: No fracture, evidence of discitis, or bone lesion. Prior C3-C7 ACDF. Cord: Small foci increased T2 signal within the spinal cord at C4-C5 and C5-C6 likely represent  myelomalacia given mild cord atrophy in this region. Posterior Fossa, vertebral arteries, paraspinal tissues: Negative. Disc levels: C2-C3: Negative disc. Mild bilateral facet arthropathy. No stenosis. C3-C4:  Prior ACDF.  No residual stenosis. C4-C5:  Prior ACDF.  No residual stenosis. C5-C6: Prior ACDF. Left paracentral osseous ridging contacts the left ventral cord. Mild spinal canal stenosis. No neuroforaminal stenosis. C6-C7: Prior ACDF. Residual mild to moderate bilateral neuroforaminal stenosis due to bony hypertrophy. No spinal canal stenosis. C7-T1: Small circumferential disc osteophyte complex. Bilateral uncovertebral hypertrophy. Mild spinal canal stenosis. Moderate bilateral neuroforaminal stenosis. IMPRESSION: 1. 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 2. Left paracentral osseous ridging at C5-C6 contacts the left ventral cord with mild spinal canal stenosis. 3. Residual mild to  moderate bilateral neuroforaminal stenosis at C6-C7 due to bony hypertrophy. 4. 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 Electronically Signed   By: Titus Dubin M.D.   On: 02/10/2022 20:25   CT ANGIO HEAD NECK W WO CM  Result Date: 02/10/2022 CLINICAL DATA:  Central vertigo EXAM: CT ANGIOGRAPHY HEAD AND NECK TECHNIQUE: Multidetector CT imaging of the head and neck was performed using the standard protocol during bolus administration of intravenous contrast. Multiplanar CT image reconstructions and MIPs were obtained to evaluate the vascular anatomy. Carotid stenosis measurements (when applicable) are obtained utilizing NASCET criteria, using the distal internal carotid diameter as the denominator. RADIATION DOSE REDUCTION: This exam was performed according to the departmental dose-optimization program which includes automated exposure control, adjustment of the mA and/or  kV according to patient size and/or use of iterative reconstruction technique. CONTRAST:  66m OMNIPAQUE IOHEXOL 350 MG/ML SOLN COMPARISON:  None Available. FINDINGS: CT HEAD FINDINGS Brain: There is no mass, hemorrhage or extra-axial collection. The size and configuration of the ventricles and extra-axial CSF spaces are normal. There is no acute or chronic infarction. The brain parenchyma is normal. Skull: The visualized skull base, calvarium and extracranial soft tissues are normal. Sinuses/Orbits: No fluid levels or advanced mucosal thickening of the visualized paranasal sinuses. No mastoid or middle ear effusion. The orbits are normal. CTA NECK FINDINGS SKELETON: There is no bony spinal canal stenosis. No lytic or blastic lesion. C3-7 ACDF OTHER NECK: Normal pharynx, larynx and major salivary glands. No cervical lymphadenopathy. Unremarkable thyroid gland. UPPER CHEST: Biapical emphysema AORTIC ARCH: There is no calcific atherosclerosis of the aortic arch. There is no aneurysm, dissection or hemodynamically significant stenosis of the visualized portion of the aorta. Conventional 3 vessel aortic branching pattern. The visualized proximal subclavian arteries are widely patent. RIGHT CAROTID SYSTEM: Normal without aneurysm, dissection or stenosis. LEFT CAROTID SYSTEM: Normal without aneurysm, dissection or stenosis. VERTEBRAL ARTERIES: Left dominant configuration. Both origins are clearly patent. There is no dissection, occlusion or flow-limiting stenosis to the skull base (V1-V3 segments). CTA HEAD FINDINGS POSTERIOR CIRCULATION: --Vertebral arteries: Normal V4 segments. --Inferior cerebellar arteries: Normal. --Basilar artery: Normal. --Superior cerebellar arteries: Normal. --Posterior cerebral arteries (PCA): Normal. ANTERIOR CIRCULATION: --Intracranial internal carotid arteries: Normal. --Anterior cerebral arteries (ACA): Normal. Both A1 segments are present. Patent anterior communicating artery (a-comm).  --Middle cerebral arteries (MCA): Normal. VENOUS SINUSES: As permitted by contrast timing, patent. ANATOMIC VARIANTS: None Review of the MIP images confirms the above findings. IMPRESSION: No emergent large vessel occlusion or high-grade stenosis of the intracranial or cervical arteries. Aortic Atherosclerosis (ICD10-I70.0) and Emphysema (ICD10-J43.9). Electronically Signed   By: KUlyses JarredM.D.   On: 02/10/2022 19:23   EEG adult  Result Date: 02/10/2022 SDerek Jack MD     02/10/2022 12:55 PM Routine EEG Report MViki Curtis a 81y.o. female with a history of cognitive impairment and paresthesais who is undergoing an EEG to evaluate for seizures. Report: This EEG was acquired with electrodes placed according to the International 10-20 electrode system (including Fp1, Fp2, F3, F4, C3, C4, P3, P4, O1, O2, T3, T4, T5, T6, A1, A2, Fz, Cz, Pz). The following electrodes were missing or displaced: none. The occipital dominant rhythm was 8.5 Hz. This activity is reactive to stimulation. Drowsiness was manifested by background fragmentation; deeper stages of sleep were identified by K complexes and sleep spindles. There was no focal slowing. There were no interictal epileptiform  discharges. There were no electrographic seizures identified. There was no abnormal response to photic stimulation or hyperventilation. Impression: This EEG was obtained while awake and asleep and is normal.   Clinical Correlation: Normal EEGs, however, do not rule out epilepsy. Su Monks, MD [facility] [phone] If 7pm- 7am, please page neurology on call as listed in [place].   MR BRAIN/IAC W WO CONTRAST  Result Date: 02/09/2022 CLINICAL DATA:  Patient with progressive left sided weakness and numbness, headache worse with valsalva, left facial droop, dysarthria, foggy headedness, vertigo, falls. EXAM: MRI HEAD WITHOUT AND WITH CONTRAST TECHNIQUE: Multiplanar, multiecho pulse sequences of the brain and surrounding  structures were obtained without and with intravenous contrast. CONTRAST:  69m GADAVIST GADOBUTROL 1 MMOL/ML IV SOLN COMPARISON:  MRI of the brain February 08, 2022. FINDINGS: Brain: No acute infarction, hemorrhage, hydrocephalus, extra-axial collection or mass lesion. Few scattered foci of T2 hyperintensity within the white matter of the cerebral hemispheres, nonspecific, most likely related to chronic small vessel ischemia. Mild parenchymal volume. No focus of abnormal contrast enhancement identified. Dedicated imaging through the internal auditory canals demonstrates a normal course of cranial nerves VII and VIII without evidence of a mass or abnormal enhancement. The inner ear structures demonstrate normal signal and morphology bilaterally. No mass is present in the cerebellopontine angles. Vascular: Normal flow voids. Skull and upper cervical spine: No focal marrow lesion identified. Sinuses/Orbits: Negative. Other: None. IMPRESSION: 1. No acute intracranial abnormality. 2. No evidence cerebellopontine angle or internal auditory canal mass lesion or abnormal contrast enhancement. Electronically Signed   By: KPedro EarlsM.D.   On: 02/09/2022 14:21   DG Chest Port 1 View  Result Date: 02/08/2022 CLINICAL DATA:  Left-sided weakness EXAM: PORTABLE CHEST 1 VIEW COMPARISON:  06/12/2021 FINDINGS: Hardware in the cervical spine. Mild diffuse chronic interstitial opacity. No acute consolidation, pleural effusion, or pneumothorax. Stable cardiomediastinal silhouette. IMPRESSION: No active disease. Mild diffuse chronic appearing interstitial opacity Electronically Signed   By: KDonavan FoilM.D.   On: 02/08/2022 21:49   MR BRAIN WO CONTRAST  Result Date: 02/08/2022 CLINICAL DATA:  Left-sided numbness and weakness EXAM: MRI HEAD WITHOUT CONTRAST TECHNIQUE: Multiplanar, multiecho pulse sequences of the brain and surrounding structures were obtained without intravenous contrast. COMPARISON:  No prior MRI,  correlation is made with CT head 02/08/2022 FINDINGS: Brain: No restricted diffusion to suggest acute or subacute infarct. No acute hemorrhage, mass mass effect, or midline shift. No hemosiderin deposition to suggest remote hemorrhage. No hydrocephalus or extra-axial collection. Minimal T2 hyperintense signal in the periventricular white matter, likely the sequela of mild chronic small vessel ischemic disease. The right parietal hypodensity on the prior CT correlates with a possible remote infarct, although this is not associated with T2 hyperintense signal on FLAIR as would be expected with a remote infarct. No correlate is seen for the decreased attenuation in the midbrain on CT. Vascular: Normal arterial flow voids. Skull and upper cervical spine: Normal marrow signal. Sinuses/Orbits: Evaluation of the left maxillary sinus is somewhat limited by susceptibility artifact. Otherwise clear paranasal sinuses. Status post bilateral lens replacements. Other: Trace fluid in the right mastoid air cells. IMPRESSION: No acute intracranial process. Electronically Signed   By: AMerilyn BabaM.D.   On: 02/08/2022 18:08   CT HEAD WO CONTRAST  Result Date: 02/08/2022 CLINICAL DATA:  81year old female with acute LEFT-sided numbness, weakness and headache. EXAM: CT HEAD WITHOUT CONTRAST TECHNIQUE: Contiguous axial images were obtained from the base of the skull through  the vertex without intravenous contrast. RADIATION DOSE REDUCTION: This exam was performed according to the departmental dose-optimization program which includes automated exposure control, adjustment of the mA and/or kV according to patient size and/or use of iterative reconstruction technique. COMPARISON:  None Available. FINDINGS: Brain: Decreased attenuation within the mid brain noted and may represent artifact, ischemia or less likely infarct. Subcortical white matter a small area of subcortical white matter hypodensity in the RIGHT parietal region (series  2: Images 18-19) No other evidence of acute infarction, hemorrhage, hydrocephalus, extra-axial collection or mass lesion/mass effect. Vascular: Carotid atherosclerotic calcifications are noted. Skull: Normal. Negative for fracture or focal lesion. Sinuses/Orbits: No acute abnormality. Other: None. IMPRESSION: 1. Decreased attenuation within the mid brain, which may represent artifact, ischemia or less likely infarct. Consider MRI for further evaluation as clinically indicated. 2. Small area of nonspecific subcortical white matter hypodensity in the RIGHT parietal region, age indeterminate. Consider MRI for further evaluation. Electronically Signed   By: Margarette Canada M.D.   On: 02/08/2022 16:46

## 2022-04-07 NOTE — Assessment & Plan Note (Addendum)
#  History of AML in 2007, status post bone marrow transplant. Labs reviewed and discussed with patient.  Negative peripheral flow cytometry. Jan 2023 Bone marrow biopsy results were reviewed and discussed with patient. No leukemia, lymphoma or high-grade dysplasia.  Cytogenetics were normal. 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  If still negative, we will stop repeating annual marrow biopsy.  She agrees with the plan.

## 2022-04-08 ENCOUNTER — Other Ambulatory Visit: Payer: Medicare HMO

## 2022-04-08 ENCOUNTER — Ambulatory Visit: Payer: Medicare HMO | Admitting: Oncology

## 2022-04-17 ENCOUNTER — Telehealth: Payer: Self-pay | Admitting: Family Medicine

## 2022-04-17 DIAGNOSIS — K5900 Constipation, unspecified: Secondary | ICD-10-CM

## 2022-04-17 NOTE — Telephone Encounter (Signed)
Referral placed.   Would also recommend the following - if patient can read in mychart can send that way but please call to confirm.   Constipation   Constipation is a common issue. Often it is related to diet and occasionally medications.   What you can do to treat your symptoms 1) Fiber -- Eat more fiber rich foods: beans, broccoli, berries, avocados, popcorn, pear/apple, green peas, turnip greens, brussels sprouts, whole grains (barley, bran, quinoa, oatmeal) -- Take a Fiber supplement: Psyllium (Metamucil)  -- Could also eat Prunes daily  2) Hydration  -- Drink more water: Try to drink 64 oz of water per day  3) Exercise -- Moderate exercise (walking, jogging, biking) for 30 minutes, 5 days a week  4) Dedicate time for Bowel movements - do not delay  5) Stool Softener  - Docusate Sodium (Colace) 100 mg daily or twice daily as needed   If 4-6 weeks have passed and the above has not helped then start the following 6) Laxatives -- Polyethylene Glycol (Miralax) - begin with once daily. After a few days can increase to twice daily Or -- Magnesium Citrate -- Common side effect is nausea and diarrhea -- can try if still not improved  Treating chronic constipation is often about finding the right amount of medication and fiber to keep you regular and comfortable. For some people that may be daily metamucil and colace every other day. For others it may be Metamucil and colace twice daily and Miralax 3 times a week. The goal is to go slow and listen to your body. And normal can be anywhere from 2-3 soft bowel movements a day to 1 bowel movement every 2-3 days.

## 2022-04-17 NOTE — Telephone Encounter (Signed)
Heart burn is better  Would like to proceed with a referral to GI for constipation  She is constipated with stomach pain

## 2022-04-20 ENCOUNTER — Other Ambulatory Visit: Payer: Self-pay | Admitting: Family Medicine

## 2022-04-20 DIAGNOSIS — M8580 Other specified disorders of bone density and structure, unspecified site: Secondary | ICD-10-CM

## 2022-04-20 NOTE — Telephone Encounter (Signed)
Spoke to pt. PT is on cancellation list with [facility] and will stay scheduled for December appt. Pt reports having a BM today, after 6 days, so she is feeling a little better. Pt will eat more fiber and take colace as needed.

## 2022-04-20 NOTE — Telephone Encounter (Signed)
Patient called in and stated she talked to someone at the GI office and they don't have anything until December 19th. She stated she can't wait any longer. She doesn't want to go anywhere else but [facility]. Please advise. Thank you!

## 2022-04-21 DIAGNOSIS — R449 Unspecified symptoms and signs involving general sensations and perceptions: Secondary | ICD-10-CM

## 2022-04-24 ENCOUNTER — Other Ambulatory Visit: Payer: Self-pay

## 2022-04-28 ENCOUNTER — Ambulatory Visit (INDEPENDENT_AMBULATORY_CARE_PROVIDER_SITE_OTHER): Payer: Medicare HMO | Admitting: *Deleted

## 2022-04-28 DIAGNOSIS — E538 Deficiency of other specified B group vitamins: Secondary | ICD-10-CM | POA: Diagnosis not present

## 2022-04-28 MED ORDER — CYANOCOBALAMIN 1000 MCG/ML IJ SOLN
1000.0000 ug | Freq: Once | INTRAMUSCULAR | Status: AC
  Administered 2022-04-28: 1000 ug via INTRAMUSCULAR

## 2022-04-28 NOTE — Progress Notes (Signed)
Per orders of Dr. Gutierrez, injection of Vitamin B-12 given by Isha Seefeld. Patient tolerated injection well. 

## 2022-05-08 ENCOUNTER — Other Ambulatory Visit: Payer: Self-pay | Admitting: Family Medicine

## 2022-05-08 DIAGNOSIS — Z1231 Encounter for screening mammogram for malignant neoplasm of breast: Secondary | ICD-10-CM

## 2022-05-24 ENCOUNTER — Ambulatory Visit
Admission: EM | Admit: 2022-05-24 | Discharge: 2022-05-24 | Disposition: A | Discharge status: Home / Self Care | Payer: Medicare HMO | Attending: Urgent Care | Admitting: Urgent Care

## 2022-05-24 DIAGNOSIS — J069 Acute upper respiratory infection, unspecified: Secondary | ICD-10-CM | POA: Insufficient documentation

## 2022-05-24 DIAGNOSIS — R062 Wheezing: Secondary | ICD-10-CM | POA: Insufficient documentation

## 2022-05-24 DIAGNOSIS — J029 Acute pharyngitis, unspecified: Secondary | ICD-10-CM | POA: Insufficient documentation

## 2022-05-24 LAB — POCT RAPID STREP A (OFFICE): Rapid Strep A Screen: NEGATIVE

## 2022-05-24 MED ORDER — METHYLPREDNISOLONE 4 MG PO TBPK: ORAL_TABLET | ORAL | 0 refills | Status: DC

## 2022-05-24 NOTE — ED Provider Notes (Signed)
Roberta Curtis    CSN: [id number] Arrival date & time: 05/24/22  0946      History   Chief Complaint Chief Complaint  Patient presents with   Sore Throat   Nasal Congestion    HPI Roberta Curtis is a 81 y.o. female.    Sore Throat    Patient presents to urgent care with 1 day complaint of acutely sore throat, nasal congestion with drainage and sneezing started yesterday.  She has been taking Benadryl and reports some improvement in symptoms.  Upon providers return to the room, patient also reports wheezing that she did not describe during initial interview .  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   Anemia 02/24/2022   B12 deficiency 02/12/2022   Headache disorder 02/12/2022   Constipation 02/12/2022   History of anemia 02/11/2022   Neurological deficit present    Stroke-like symptoms 02/09/2022   Questionable TIA (transient ischemic attack) 02/08/2022   Left facial numbness, dizziness, headache 02/08/2022   Left-sided weakness 02/08/2022   Hypochromic-microcytic anemia 02/08/2022   Acute left otitis media 01/19/2022   Dizziness 01/08/2022   Urinary incontinence 01/08/2022   Pneumonia of both lungs due to infectious organism 01/08/2022   Vertigo 01/08/2022   Pleural effusion 01/08/2022   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   Neuropathy 11/05/2021   Candidiasis 11/05/2021   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   Urge incontinence 09/05/2020   Burn 09/05/2020   Chronic nausea 08/13/2020   Chronic bilateral low back pain without sciatica 41/66/0630   Mild diastolic dysfunction 16/09/930   AML (acute myeloid leukemia) in remission ([place]) 02/12/2020   S/P bone marrow transplant ([place]) 02/12/2020    Osteopenia 02/12/2020   GERD (gastroesophageal reflux disease) 02/12/2020   Mitral valve regurgitation 02/12/2020   Prediabetes 02/12/2020   CKD (chronic kidney disease) stage 2, 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      Home Medications    Prior to Admission medications   Medication Sig Start Date End Date Taking? Authorizing Provider  albuterol (VENTOLIN HFA) 108 (90 Base) MCG/ACT inhaler Inhale 1-2 puffs into the lungs as needed for wheezing or shortness of breath. 02/13/20   Lesleigh Noe, MD  aspirin EC 81 MG tablet Take 81 mg by mouth daily. Swallow whole.    [provider]  Biotin 1 MG CAPS Take 1,000 mcg by mouth daily.     [provider]  calcium carbonate (OS-CAL - DOSED IN MG OF ELEMENTAL CALCIUM) 1250 (500 Ca) MG tablet Take 1 tablet by mouth daily.    [provider]  Cholecalciferol (VITAMIN D3) 50 MCG (2000 UT) CAPS Take 4,000 Units by mouth daily.    [provider]  cyanocobalamin (,  VITAMIN B-12,) 1000 MCG/ML injection Inject 1 mL (1000 mcg total) into the muscle on the following dates: 02/16/22, 02/19/22, 02/22/22, then weekly after that for 5 doses on approximately 03/01/22, 03/08/22, 03/15/22, 03/22/22, 03/29/22 02/16/22   Emeterio Reeve, DO  esomeprazole (NEXIUM) 20 MG capsule Take 1 capsule (20 mg total) by mouth daily at 12 noon. 03/23/22   Lesleigh Noe, MD  ibandronate (BONIVA) 150 MG tablet TAKE 1 TABLET (150 MG TOTAL) BY MOUTH EVERY 30 (THIRTY) DAYS. 04/21/22   Lesleigh Noe, MD  meclizine (ANTIVERT) 12.5 MG tablet Take 12.5 mg by mouth 3 (three) times daily as needed.     [provider]  methocarbamol (ROBAXIN) 500 MG tablet Take 1 tablet (500 mg total) by mouth every 8 (eight) hours as needed for muscle spasms. 02/14/22   Emeterio Reeve, DO    Family History Family History  Problem Relation Age of Onset   Retinitis pigmentosa Mother    Stroke Mother    Liver cancer Father    Heart disease Father    Heart disease Brother    Blindness Brother     Social History Social History   Tobacco Use   Smoking status: Former    Packs/day: 1.00    Years: 6.00    Total pack years: 6.00    Types: Cigarettes    Quit date: 02/20/1970    Years since quitting: 52.2   Smokeless tobacco: Never  Vaping Use   Vaping Use: Never used  Substance Use Topics   Alcohol use: Not Currently   Drug use: Never     Allergies   Jadenu [deferasirox], Levofloxacin, Ondansetron hcl, Codeine, Oxycodone-acetaminophen, and Penicillins   Review of Systems Review of Systems   Physical Exam Triage Vital Signs ED Triage Vitals  Enc Vitals Group     BP 05/24/22 1048 129/72     Pulse Rate 05/24/22 1048 85     Resp 05/24/22 1048 16     Temp 05/24/22 1048 98.9 F (37.2 C)     Temp Source 05/24/22 1048 Oral     SpO2 05/24/22 1048 96 %     Weight --      Height --      Head Circumference --      Peak Flow --      Pain Score 05/24/22 1027 9     Pain Loc --      Pain Edu? --      Excl. in [place]? --    No data found.  Updated Vital Signs BP 129/72 (BP Location: Left Arm) Comment: no left sided BP, or blood draws  Pulse 85   Temp 98.9 F (37.2 C) (Oral)   Resp 16   SpO2 96%   Visual Acuity Right Eye Distance:   Left Eye Distance:   Bilateral Distance:    Right Eye Near:   Left Eye Near:    Bilateral Near:     Physical Exam Vitals reviewed.  Constitutional:      Appearance: She is well-developed.  Skin:    General: Skin is warm and dry.  Neurological:     General: No focal deficit present.     Mental Status: She is alert and oriented to  person, place, and time.  Psychiatric:        Mood and Affect: Mood normal.        Behavior: Behavior normal.      UC Treatments / Results  Labs (all labs ordered are listed, but  only abnormal results are displayed) Labs Reviewed  POCT RAPID STREP A (OFFICE)    EKG   Radiology No results found.  Procedures Procedures (including critical care time)  Medications Ordered in UC Medications - No data to display  Initial Impression / Assessment and Plan / UC Course  I have reviewed the triage vital signs and the nursing notes.  Pertinent labs & imaging results that were available during my care of the patient were reviewed by me and considered in my medical decision making (see chart for details).   Physical exam is remarkable for mildly erythematous pharynx.  Wheezing on auscultation of her lungs.  Will treat likely viral bronchitis with corticosteroid which should also improve her throat symptoms.   Final Clinical Impressions(s) / UC Diagnoses   Final diagnoses:  Sore throat   Discharge Instructions   None    ED Prescriptions   None    PDMP not reviewed this encounter.   Rose Phi, [place] 05/24/22 1111

## 2022-05-24 NOTE — Discharge Instructions (Addendum)
Follow-up here or with your primary care provider if your symptoms do not resolve with treatment or worsen.  You are being treated today with a corticosteroid which should act as an anti-inflammatory to help resolve your wheezing.  This medicine should also help resolve the pain in your throat.  Your symptoms are most likely caused by a viral organism and will resolve within a few days.

## 2022-05-24 NOTE — ED Triage Notes (Signed)
Pt. C/o a sore throat, nasal drainage and sneezing since yesterday. Pt. Has taken benadryl and it has helped.

## 2022-05-25 ENCOUNTER — Ambulatory Visit (INDEPENDENT_AMBULATORY_CARE_PROVIDER_SITE_OTHER): Payer: Medicare HMO | Admitting: Family Medicine

## 2022-05-25 ENCOUNTER — Ambulatory Visit (INDEPENDENT_AMBULATORY_CARE_PROVIDER_SITE_OTHER)
Admission: RE | Admit: 2022-05-25 | Discharge: 2022-05-25 | Disposition: A | Discharge status: Home / Self Care | Payer: Medicare HMO | Source: Ambulatory Visit | Attending: Family Medicine | Admitting: Family Medicine

## 2022-05-25 ENCOUNTER — Encounter: Payer: Self-pay | Admitting: Family Medicine

## 2022-05-25 ENCOUNTER — Telehealth: Payer: Self-pay

## 2022-05-25 VITALS — BP 120/60 | HR 80 | Temp 98.0°F | Wt 139.0 lb

## 2022-05-25 DIAGNOSIS — Z20822 Contact with and (suspected) exposure to covid-19: Secondary | ICD-10-CM

## 2022-05-25 DIAGNOSIS — R062 Wheezing: Secondary | ICD-10-CM | POA: Diagnosis not present

## 2022-05-25 DIAGNOSIS — U071 COVID-19: Secondary | ICD-10-CM

## 2022-05-25 LAB — POC COVID19 BINAXNOW: SARS Coronavirus 2 Ag: POSITIVE — AB

## 2022-05-25 MED ORDER — ALBUTEROL SULFATE HFA 108 (90 BASE) MCG/ACT IN AERS: 1.0000 | INHALATION_SPRAY | RESPIRATORY_TRACT | 1 refills | Status: AC | PRN

## 2022-05-25 MED ORDER — MOLNUPIRAVIR EUA 200MG CAPSULE: 4.0000 | ORAL_CAPSULE | Freq: Two times a day (BID) | ORAL | 0 refills | Status: AC

## 2022-05-25 NOTE — Telephone Encounter (Signed)
Spoke to pts dtr, Benjamine Mola and she states that she listened to pt's chest and back this morning and it sounds very wet. Dtr concerned considering pt's history with pneumonia.

## 2022-05-25 NOTE — Telephone Encounter (Signed)
Noted will see today.    

## 2022-05-25 NOTE — Progress Notes (Signed)
Subjective:     Roberta Curtis is a 81 y.o. female presenting for Sore Throat (X 2 days ), Nasal Congestion, and Wheezing     HPI  Sore throat started on 05/24/22 Wheezing No cough No fever Congestion No body aches Feels tired     Review of Systems  05/25/2022: UC - sore throat - negative strep testing - wheezing on exam - given steroid course    Social History   Tobacco Use  Smoking Status Former   Packs/day: 1.00   Years: 6.00   Total pack years: 6.00   Types: Cigarettes   Quit date: 02/20/1970   Years since quitting: 52.2  Smokeless Tobacco Never        Objective:    BP Readings from Last 3 Encounters:  05/25/22 120/60  05/24/22 129/72  04/07/22 (!) 130/54   Wt Readings from Last 3 Encounters:  05/25/22 139 lb (63 kg)  04/07/22 141 lb (64 kg)  03/23/22 138 lb 4 oz (62.7 kg)    BP 120/60   Pulse 80   Temp 98 F (36.7 C) (Temporal)   Wt 139 lb (63 kg)   SpO2 99%   BMI 26.26 kg/m    Physical Exam Constitutional:      General: She is not in acute distress.    Appearance: She is well-developed. She is not diaphoretic.  HENT:     Head: Normocephalic and atraumatic.     Right Ear: Tympanic membrane and ear canal normal.     Left Ear: Tympanic membrane and ear canal normal.     Nose: Mucosal edema and rhinorrhea present.     Right Sinus: No maxillary sinus tenderness or frontal sinus tenderness.     Left Sinus: No maxillary sinus tenderness or frontal sinus tenderness.     Mouth/Throat:     Pharynx: Uvula midline. Posterior oropharyngeal erythema present. No oropharyngeal exudate.     Tonsils: 0 on the right. 0 on the left.  Eyes:     General: No scleral icterus.    Conjunctiva/sclera: Conjunctivae normal.  Cardiovascular:     Rate and Rhythm: Normal rate and regular rhythm.     Heart sounds: Normal heart sounds. No murmur heard. Pulmonary:     Effort: Pulmonary effort is normal. No respiratory distress.     Breath sounds: Wheezing  (diffuse) present. No rhonchi or rales.  Musculoskeletal:     Cervical back: Neck supple.  Lymphadenopathy:     Cervical: No cervical adenopathy.  Skin:    General: Skin is warm and dry.     Capillary Refill: Capillary refill takes less than 2 seconds.  Neurological:     Mental Status: She is alert.     CXR: my read - no consolidations, clear      Assessment & Plan:   Problem List Items Addressed This Visit   None Visit Diagnoses     COVID-19 virus infection    -  Primary   Relevant Medications   molnupiravir EUA (LAGEVRIO) 200 mg CAPS capsule   Other Relevant Orders   DG Chest 2 View (Completed)   Suspected COVID-19 virus infection       Relevant Orders   POC COVID-19 BinaxNow (Completed)   Wheezing       Relevant Medications   albuterol (VENTOLIN HFA) 108 (90 Base) MCG/ACT inhaler      Patient is at increased risk for developing severe covid due to age, cardiovascular disease. They are eligible for anti-viral medication.  Would like to try molnupiravir due to hx of nausea and concern for side effects with paxlovid  Lab Results  Component Value Date   ALT 15 04/07/2022   AST 20 04/07/2022   ALKPHOS 58 04/07/2022   BILITOT 0.6 04/07/2022    Lab Results  Component Value Date   CREATININE 0.75 04/07/2022    CXR today due to wheezing to r/o pneumonia - clear lungs  Medication sent to pharmacy  Reviewed ER and return precautions  Reviewed isolation guidelines.     Return in about 2 months (around 07/25/2022) for TOC with Tabitha .  Lesleigh Noe, MD

## 2022-05-25 NOTE — Telephone Encounter (Signed)
Per chart review tab pt was seen at [facility] on 05/24/22. Pt already has appt scheduled with Dr Einar Pheasant 05/25/22 at 3:20. Sending note to DR Einar Pheasant and [place]  CMA.     [facility] - Client TELEPHONE ADVICE RECORD [facility] Patient Name: Roberta Curtis Gender: Female DOB: 11/17/1940 Age: 81 Y 2 M 18 D Return Phone Number: [phone] (Primary) Address: City/ State/ Zip: [place]  [address] Client [name] Client Site [facility] Provider Waunita Schooner- MD Contact Type Call Who Is Calling Patient / Member / Family / Caregiver Call Type Triage / Clinical Relationship To Patient Self Return Phone Number [phone] (Primary) Chief Complaint Sore Throat Reason for Call Symptomatic / Request for [place] states she has congestion and sore throat Translation No Nurse Assessment Nurse: Schreffler, RN, Joelene Millin Date/Time (Eastern Time): 05/24/2022 8:06:12 AM Confirm and document reason for call. If symptomatic, describe symptoms. ---Caller states she has sore throat and runny nose (clear drainage) starting yesterday. Caller states she can hardly speak and difficulty. T unknown today but denies fever. Does the patient have any new or worsening symptoms? ---Yes Will a triage be completed? ---Yes Related visit to physician within the last 2 weeks? ---No Does the PT have any chronic conditions? (i.e. diabetes, asthma, this includes High risk factors for pregnancy, etc.) ---Yes List chronic conditions. ---asthma Is this a behavioral health or substance abuse call? ---No Guidelines Guideline Title Affirmed Question Affirmed Notes Nurse Date/Time Eilene Ghazi Time) Sore Throat SEVERE (e.g., excruciating) throat pain Schreffler, RN, Joelene Millin 05/24/2022 8:09:06 AM Disp. Time Eilene Ghazi Time) Disposition Final User 05/24/2022  8:14:24 AM See PCP within 24 Hours Yes Schreffler, RN, Joelene Millin Final Disposition 05/24/2022 8:14:24 AM See PCP within 24 Hours Yes Schreffler, RN, Joelene Millin PLEASE NOTE: All timestamps contained within this report are represented as [place] Standard Time. CONFIDENTIALTY NOTICE: This fax transmission is intended only for the addressee. It contains information that is legally privileged, confidential or otherwise protected from use or disclosure. If you are not the intended recipient, you are strictly prohibited from reviewing, disclosing, copying using or disseminating any of this information or taking any action in reliance on or regarding this information. If you have received this fax in error, please notify us immediately by telephone so that we can arrange for its return to [place]. Phone: [phone], Toll-Free: [phone], Fax: [phone] Page: 2 of 2 Call Id: [id number] [place] Disagree/Comply Comply Caller Understands Yes PreDisposition Go to Urgent Care/Walk-In Clinic Care Advice Given Per Guideline * IF OFFICE WILL BE CLOSED: You need to be seen within the next 24 hours. A clinic or an urgent care center is often a good source of care if your doctor's office is closed or you can't get an appointment. CALL BACK IF: * You become worse CARE ADVICE given per Sore Throat (Adult) guideline. Referrals GO TO FACILITY UNDECIDE

## 2022-05-25 NOTE — Patient Instructions (Addendum)
Covid 19 infection  Medication: molnupiravir 4 pills twice daily for 5 days  Throat: Take tylenol and ibuprofen as needed, cough drops, honey  Isolate 5 day - 4/99-7/18/2099 Mask in public 0/68-9/34/0684  Ok to stop wearing mask and return to normal on 06/03/2022  Return in 2 weeks if not feeling normal to listen to lungs again  Ok to continue steroids

## 2022-05-27 LAB — CULTURE, GROUP A STREP (THRC)

## 2022-05-28 ENCOUNTER — Telehealth: Payer: Self-pay | Admitting: Family Medicine

## 2022-05-28 NOTE — Telephone Encounter (Signed)
Patient called in to let Dr Glori Bickers know that the rxmolnupiravir EUA (LAGEVRIO) 200 mg CAPS capsule  prescribed to her for covid is making her really sick. She's afraid to take any more of the medication. She would like a phone call to know what she should do.

## 2022-05-28 NOTE — Telephone Encounter (Signed)
Attempted to call patient x 2 with inability to complete call.   Routing to MA to try patient in 1 hour.   Please find out what side effect she is having. If nausea we could try and anti-nausea medication to see if that helps her tolerate.   Or we could switch to paxlovid - though this also can cause nausea.

## 2022-05-28 NOTE — Telephone Encounter (Signed)
Patient called back in and informed she had been experiencing some weakness, a bubbling stomach, and nausea. She stated that she took Tums to help with the nausea. She stated she can be reached at [phone].

## 2022-05-29 NOTE — Telephone Encounter (Signed)
Noted. Diarrhea is also a symptom of covid.   Hopefully she got some benefit from the early days of treatment.

## 2022-05-29 NOTE — Telephone Encounter (Signed)
I spoke with pt and pt notified as instructed and pt voiced understanding. pt had diarrhea x 4 on 05/28/22. No diarrhea so far today. Pt said that tums seems to be helping nausea since pt has stopped taking molnupiravir. Pt said she does not want to try a different med like paxlovid because she cannot tolerate the nausea. Pt said she is feeling better and does not want to take molnupiravir or another substitute med for that. Pt said if needed she will cb but right now she is feeling some better and appreciates the call. Will send note to Dr Einar Pheasant.

## 2022-05-29 NOTE — Telephone Encounter (Signed)
Please follow-up with patient.   Would she like to just try and treat the nausea as she only has a few pills left?   Or we can switch to paxlovid - however, this also causes nausea.

## 2022-06-02 ENCOUNTER — Ambulatory Visit: Payer: Medicare HMO

## 2022-06-02 ENCOUNTER — Telehealth: Payer: Self-pay | Admitting: Family Medicine

## 2022-06-02 NOTE — Telephone Encounter (Signed)
Patient called and asked can she get a flu shot when she has her B12 shot on 06/05/2022.

## 2022-06-02 NOTE — Telephone Encounter (Signed)
Spoke to pt and let her know that she could get both shots at her nurse visit

## 2022-06-05 ENCOUNTER — Ambulatory Visit (INDEPENDENT_AMBULATORY_CARE_PROVIDER_SITE_OTHER): Payer: Medicare HMO

## 2022-06-05 DIAGNOSIS — E538 Deficiency of other specified B group vitamins: Secondary | ICD-10-CM

## 2022-06-05 DIAGNOSIS — Z23 Encounter for immunization: Secondary | ICD-10-CM | POA: Diagnosis not present

## 2022-06-05 MED ORDER — CYANOCOBALAMIN 1000 MCG/ML IJ SOLN
1000.0000 ug | Freq: Once | INTRAMUSCULAR | Status: AC
  Administered 2022-06-05: 1000 ug via INTRAMUSCULAR

## 2022-06-05 NOTE — Progress Notes (Signed)
Per orders of Dr. Cody, injection of vit B12 given by Jakeya Gherardi. Patient tolerated injection well.  

## 2022-06-08 ENCOUNTER — Ambulatory Visit: Payer: Medicare HMO | Admitting: Family Medicine

## 2022-06-10 ENCOUNTER — Ambulatory Visit: Payer: Medicare HMO | Admitting: Family Medicine

## 2022-06-24 ENCOUNTER — Ambulatory Visit
Admission: RE | Admit: 2022-06-24 | Discharge: 2022-06-24 | Disposition: A | Discharge status: Home / Self Care | Payer: Medicare HMO | Source: Ambulatory Visit | Attending: Family Medicine | Admitting: Family Medicine

## 2022-06-24 DIAGNOSIS — Z1231 Encounter for screening mammogram for malignant neoplasm of breast: Secondary | ICD-10-CM | POA: Insufficient documentation

## 2022-07-03 ENCOUNTER — Telehealth: Payer: Self-pay | Admitting: Family Medicine

## 2022-07-03 NOTE — Telephone Encounter (Signed)
Spoke with patient to schedule Medicare Annual Wellness Visit (AWV) either virtually or in office.   She stated she has appt end of nov she will discuss with provider  call back in 2024  Last AWV 02/18/21    45 min for awv-i and in office appointments 30 min for awv-s  phone/virtual appointments

## 2022-07-07 ENCOUNTER — Ambulatory Visit: Payer: Medicare HMO

## 2022-07-08 ENCOUNTER — Ambulatory Visit (INDEPENDENT_AMBULATORY_CARE_PROVIDER_SITE_OTHER): Payer: Medicare HMO

## 2022-07-08 DIAGNOSIS — E538 Deficiency of other specified B group vitamins: Secondary | ICD-10-CM

## 2022-07-08 MED ORDER — CYANOCOBALAMIN 1000 MCG/ML IJ SOLN
1000.0000 ug | Freq: Once | INTRAMUSCULAR | Status: AC
  Administered 2022-07-08: 1000 ug via INTRAMUSCULAR

## 2022-07-08 NOTE — Progress Notes (Signed)
Patient presented for B 12 injection given by Sherrilee Gilles, CMA to left ventrogluteal per patients request, patient voiced no concerns nor showed any signs of distress during injection.

## 2022-08-06 ENCOUNTER — Encounter: Payer: Self-pay | Admitting: Family

## 2022-08-06 ENCOUNTER — Ambulatory Visit (INDEPENDENT_AMBULATORY_CARE_PROVIDER_SITE_OTHER): Payer: Medicare HMO | Admitting: Family

## 2022-08-06 VITALS — BP 124/70 | HR 66 | Temp 97.6°F | Ht 61.0 in | Wt 136.6 lb

## 2022-08-06 DIAGNOSIS — I700 Atherosclerosis of aorta: Secondary | ICD-10-CM

## 2022-08-06 DIAGNOSIS — J438 Other emphysema: Secondary | ICD-10-CM

## 2022-08-06 DIAGNOSIS — D649 Anemia, unspecified: Secondary | ICD-10-CM

## 2022-08-06 DIAGNOSIS — G8929 Other chronic pain: Secondary | ICD-10-CM

## 2022-08-06 DIAGNOSIS — R42 Dizziness and giddiness: Secondary | ICD-10-CM

## 2022-08-06 DIAGNOSIS — E538 Deficiency of other specified B group vitamins: Secondary | ICD-10-CM

## 2022-08-06 DIAGNOSIS — M545 Low back pain, unspecified: Secondary | ICD-10-CM

## 2022-08-06 DIAGNOSIS — C9201 Acute myeloblastic leukemia, in remission: Secondary | ICD-10-CM

## 2022-08-06 LAB — CBC WITH DIFFERENTIAL/PLATELET
Basophils Absolute: 0 10*3/uL (ref 0.0–0.1)
Basophils Relative: 0.7 % (ref 0.0–3.0)
Eosinophils Absolute: 0.1 10*3/uL (ref 0.0–0.7)
Eosinophils Relative: 1.9 % (ref 0.0–5.0)
HCT: 37.7 % (ref 36.0–46.0)
Hemoglobin: 11.9 g/dL — ABNORMAL LOW (ref 12.0–15.0)
Lymphocytes Relative: 39 % (ref 12.0–46.0)
Lymphs Abs: 2.5 10*3/uL (ref 0.7–4.0)
MCHC: 31.6 g/dL (ref 30.0–36.0)
MCV: 75.5 fl — ABNORMAL LOW (ref 78.0–100.0)
Monocytes Absolute: 0.6 10*3/uL (ref 0.1–1.0)
Monocytes Relative: 9 % (ref 3.0–12.0)
Neutro Abs: 3.2 10*3/uL (ref 1.4–7.7)
Neutrophils Relative %: 49.4 % (ref 43.0–77.0)
Platelets: 256 10*3/uL (ref 150.0–400.0)
RBC: 4.99 Mil/uL (ref 3.87–5.11)
RDW: 16 % — ABNORMAL HIGH (ref 11.5–15.5)
WBC: 6.4 10*3/uL (ref 4.0–10.5)

## 2022-08-06 LAB — VITAMIN B12: Vitamin B-12: 1152 pg/mL — ABNORMAL HIGH (ref 211–911)

## 2022-08-06 NOTE — Progress Notes (Signed)
 Established Patient Office Visit  Subjective:  Patient ID: Roberta Curtis, female    DOB: 10/31/1940  Age: 81 y.o. MRN: [id number]  CC:  Chief Complaint  Patient presents with   Transitions Of Care    HPI Roberta Curtis is here for a transition of care visit.  Prior provider was: Dr. Jessica Cody Pt is without acute concerns.   chronic concerns:  GERD: nexium once daily. Tried to stop taking it but the acid reflux was too much.   Boniva: takes one every thirty days.   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 Has f/u coming up with oncologist.   Dizziness, intermittently. Was in ER in June for this. CTA : negative for emergent vessel occlusion or high grade stenosis. Seen was biapical emphysema and aortic arthersclerosis. MRI brain, no acute intracranial abn. Neg  Left sided weakness: initially started in May 2023. Was placed on meclizine for suspected vertigo although weakness continued to get progressively worse. Diagnosed with suspected TIA. She does have lagging right side but this isn't from tia or bells palsy, this is from childhood injury when she was [age] years old.   Constipation: ongoing. Has appt with GI next month. Hard to get out on her own, has to rely on prune juice.   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HCC)    GERD (gastroesophageal reflux disease)    Heart murmur    Osteopenia    Thrush     Past Surgical History:  Procedure Laterality Date   BONE MARROW TRANSPLANT     BREAST LUMPECTOMY Left    fatty tumor,1970's   CATARACT EXTRACTION     CESAREAN SECTION     HYSTEROTOMY     LYMPH GLAND EXCISION     MENISCUS REPAIR Right    NECK SURGERY     SALPINGECTOMY     WISDOM TOOTH EXTRACTION      Family History  Problem Relation Age of Onset   Retinitis pigmentosa Mother    Stroke Mother    Liver cancer Father    Heart disease Father    Heart disease Brother    Blindness Brother      Social History   Socioeconomic History   Marital status: Widowed    Spouse name: Michael Fiorello   Number of children: 3   Years of education: high school   Highest education level: Not on file  Occupational History   Occupation: retired  Tobacco Use   Smoking status: Former    Packs/day: 1.00    Years: 6.00    Total pack years: 6.00    Types: Cigarettes    Quit date: 02/20/1970    Years since quitting: 52.5   Smokeless tobacco: Never  Vaping Use   Vaping Use: Never used  Substance and Sexual Activity   Alcohol use: Not Currently   Drug use: Never   Sexual activity: Not Currently    Partners: Male    Birth control/protection: Post-menopausal  Other Topics Concern   Not on file  Social History Narrative      Right handed   Lives in a condo with domestic partner   From [place]      02/12/20   From: [place], moved to be near daugther   Living: with Michael Fiorello - husband   Health Care Proxy: Elizabeth Napalatano (daughter)   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      Family:   Elizabeth (teacher, lives in [place]), Noelle ([place], [place]), Charles ([place])      Enjoys: meet new people, socialable      Exercise: not currently - cleaning   Diet: picky eater - eats veggies - but her husband doesn't like these things      Safety   Seat belts: Yes    Guns: Yes  and secure   Safe in relationships: Yes    Social Determinants of Health   Financial Resource Strain: Low Risk  (02/18/2021)   Overall Financial Resource Strain (CARDIA)    Difficulty of Paying Living Expenses: Not hard at all  Food Insecurity: No Food Insecurity (02/18/2021)   Hunger Vital Sign    Worried About Running Out of Food in the Last Year: Never true    Ran Out of Food in the Last Year: Never true  Transportation Needs: No Transportation Needs (02/18/2021)   PRAPARE - Transportation    Lack of Transportation (Medical): No    Lack of Transportation (Non-Medical): No  Physical  Activity: Inactive (02/18/2021)   Exercise Vital Sign    Days of Exercise per Week: 0 days    Minutes of Exercise per Session: 0 min  Stress: No Stress Concern Present (02/18/2021)   [employer] - Occupational Stress Questionnaire    Feeling of Stress : Not at all  Social Connections: Not on file  Intimate Partner Violence: Not At Risk (02/18/2021)   Humiliation, Afraid, Rape, and Kick questionnaire    Fear of Current or Ex-Partner: No    Emotionally Abused: No    Physically Abused: No    Sexually Abused: No    Outpatient Medications Prior to Visit  Medication Sig Dispense Refill   magnesium oxide (MAG-OX) 400 (240 Mg) MG tablet Take 400 mg by mouth daily.     albuterol (VENTOLIN HFA) 108 (90 Base) MCG/ACT inhaler Inhale 1-2 puffs into the lungs as needed for wheezing or shortness of breath. 6.7 g 1   aspirin EC 81 MG tablet Take 81 mg by mouth daily. Swallow whole.     Biotin 1 MG CAPS Take 1,000 mcg by mouth daily.      calcium carbonate (OS-CAL - DOSED IN MG OF ELEMENTAL CALCIUM) 1250 (500 Ca) MG tablet Take 1 tablet by mouth daily.     Cholecalciferol (VITAMIN D3) 50 MCG (2000 UT) CAPS Take 4,000 Units by mouth daily.     cyanocobalamin (,VITAMIN B-12,) 1000 MCG/ML injection Inject 1 mL (1000 mcg total) into the muscle on the following dates: 02/16/22, 02/19/22, 02/22/22, then weekly after that for 5 doses on approximately 03/01/22, 03/08/22, 03/15/22, 03/22/22, 03/29/22 8 mL 0   esomeprazole (NEXIUM) 20 MG capsule Take 1 capsule (20 mg total) by mouth daily at 12 noon. 90 capsule 1   ibandronate (BONIVA) 150 MG tablet TAKE 1 TABLET (150 MG TOTAL) BY MOUTH EVERY 30 (THIRTY) DAYS. 3 tablet 1   methocarbamol (ROBAXIN) 500 MG tablet Take 1 tablet (500 mg total) by mouth every 8 (eight) hours as needed for muscle spasms. 30 tablet 0   methylPREDNISolone (MEDROL DOSEPAK) 4 MG TBPK tablet Steroid taper. Take as directed by packaging. 21 tablet 0   meclizine  (ANTIVERT) 12.5 MG tablet Take 12.5 mg by mouth 3 (three) times daily as needed.     No facility-administered medications prior to visit.    Allergies  Allergen Reactions   Jadenu [Deferasirox] Other (See Comments)    Elevated Iron, headache and rash     Levofloxacin Other (See Comments)   Ondansetron Hcl Other (See Comments)   Codeine Palpitations   Oxycodone-Acetaminophen Palpitations   Penicillins Rash    ROS Review of Systems     Objective:    Physical Exam Vitals reviewed.  Constitutional:      Appearance: Normal appearance.  Cardiovascular:     Rate and Rhythm: Normal rate and regular rhythm.  Pulmonary:     Effort: Pulmonary effort is normal.  Musculoskeletal:     Right lower leg: No edema.     Left lower leg: No edema.  Neurological:     General: No focal deficit present.     Mental Status: She is alert and oriented to person, place, and time. Mental status is at baseline.     Cranial Nerves: Facial asymmetry (from injury age 33 y/o) present. No cranial nerve deficit.  Psychiatric:        Mood and Affect: Mood normal.        Behavior: Behavior normal.        Thought Content: Thought content normal.        Judgment: Judgment normal.      BP 124/70   Pulse 66   Temp 97.6 F (36.4 C) (Oral)   Ht 5' 1" (1.549 m)   Wt 136 lb 9.6 oz (62 kg)   SpO2 98%   BMI 25.81 kg/m  Wt Readings from Last 3 Encounters:  08/06/22 136 lb 9.6 oz (62 kg)  05/25/22 139 lb (63 kg)  04/07/22 141 lb (64 kg)     Health Maintenance Due  Topic Date Due   Medicare Annual Wellness (AWV)  02/18/2022    There are no preventive care reminders to display for this patient.  Lab Results  Component Value Date   TSH 1.866 04/07/2022   Lab Results  Component Value Date   WBC 6.4 08/06/2022   HGB 11.9 (L) 08/06/2022   HCT 37.7 08/06/2022   MCV 75.5 (L) 08/06/2022   PLT 256.0 08/06/2022   Lab Results  Component Value Date   NA 139 04/07/2022   K 3.9 04/07/2022   CO2 28  04/07/2022   GLUCOSE 104 (H) 04/07/2022   BUN 18 04/07/2022   CREATININE 0.75 04/07/2022   BILITOT 0.6 04/07/2022   ALKPHOS 58 04/07/2022   AST 20 04/07/2022   ALT 15 04/07/2022   PROT 7.0 04/07/2022   ALBUMIN 3.9 04/07/2022   CALCIUM 9.7 04/07/2022   ANIONGAP 6 04/07/2022   GFR 64.00 02/24/2022   Lab Results  Component Value Date   CHOL 152 02/09/2022   Lab Results  Component Value Date   HDL 42 02/09/2022   Lab Results  Component Value Date   LDLCALC 94 02/09/2022   Lab Results  Component Value Date   TRIG 80 02/09/2022   Lab Results  Component Value Date   CHOLHDL 3.6 02/09/2022   Lab Results  Component Value Date   HGBA1C 6.0 (A) 11/05/2021      Assessment & Plan:   Problem List Items Addressed This Visit       Cardiovascular and Mediastinum   Aortic 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[place])    We will continue to monitor cholesterol and work on low cholesterol diet to reduce heart disease/stroke risk.         Respiratory   Other emphysema ([place])     Other   AML (acute myeloid leukemia) in remission ([place]) (Chronic)    Stable Continue f/u with hematology/oncology as scheduled  Chronic bilateral low back pain without sciatica - Primary   Dizziness    Ongoing, pending workup with neurology 09/14/22      B12 deficiency    Recheck b12 today pending results.      Low serum vitamin B12   Relevant Orders   Vitamin B12 (Completed)   Other Visit Diagnoses     Anemia, unspecified type       Relevant Orders   CBC with Differential (Completed)   ABO AND RH  (Completed)       No orders of the defined types were placed in this encounter.   Follow-up: Return in about 6 months (around 02/04/2023) for f/u dizziness.    Tabitha Dugal, FNP 

## 2022-08-06 NOTE — Assessment & Plan Note (Addendum)
Ongoing, pending workup with neurology 09/14/22

## 2022-08-06 NOTE — Patient Instructions (Signed)
  Stop by the lab prior to leaving today. I will notify you of your results once received.    Regards,   [name] FNP-C

## 2022-08-06 NOTE — Assessment & Plan Note (Signed)
Stable Continue f/u with hematology/oncology as scheduled

## 2022-08-06 NOTE — Assessment & Plan Note (Signed)
Recheck b12 today pending results.

## 2022-08-07 LAB — ABO AND RH

## 2022-08-10 ENCOUNTER — Other Ambulatory Visit: Payer: Self-pay

## 2022-08-11 ENCOUNTER — Ambulatory Visit: Payer: Medicare HMO

## 2022-08-11 ENCOUNTER — Telehealth: Payer: Self-pay

## 2022-08-11 MED ORDER — MECLIZINE HCL 12.5 MG PO TABS: 12.5000 mg | ORAL_TABLET | Freq: Three times a day (TID) | ORAL | 0 refills | Status: DC | PRN

## 2022-08-11 NOTE — Telephone Encounter (Signed)
Noted  

## 2022-08-11 NOTE — Telephone Encounter (Signed)
Pt had nurse visit for Vit B 12 injection;Kelly spoke with [name] FNP and she said pt did not need Vit B 12 injection. See lab result note on 08/06/2022; pt said she is presently taking OTC Vitamin B 12 (500 mcg) daily. Nurse visits for Dec and Jan cancelled. Pt voiced understanding and appreciative of call. Sending to [facility] CMA as Juluis Rainier.

## 2022-08-12 ENCOUNTER — Ambulatory Visit: Payer: Medicare HMO

## 2022-08-16 NOTE — Assessment & Plan Note (Signed)
We will continue to monitor cholesterol and work on low cholesterol diet to reduce heart disease/stroke risk.

## 2022-08-16 NOTE — Progress Notes (Signed)
Patent did not view their my chart test result that I responded to one week ago.  Please call them and advise them of the below results.

## 2022-08-24 ENCOUNTER — Other Ambulatory Visit: Payer: Self-pay

## 2022-08-25 ENCOUNTER — Ambulatory Visit (INDEPENDENT_AMBULATORY_CARE_PROVIDER_SITE_OTHER): Payer: Medicare HMO | Admitting: Gastroenterology

## 2022-08-25 ENCOUNTER — Other Ambulatory Visit: Payer: Self-pay

## 2022-08-25 ENCOUNTER — Encounter: Payer: Self-pay | Admitting: Gastroenterology

## 2022-08-25 VITALS — BP 129/70 | HR 76 | Temp 97.9°F | Ht 63.0 in | Wt 136.4 lb

## 2022-08-25 DIAGNOSIS — R194 Change in bowel habit: Secondary | ICD-10-CM

## 2022-08-25 DIAGNOSIS — K5909 Other constipation: Secondary | ICD-10-CM | POA: Diagnosis not present

## 2022-08-25 NOTE — Progress Notes (Signed)
Roberta Bellows MD, MRCP(U.K) [address]  [place]  [place], [place] [address]  Main: [phone]  Fax: [phone]   Gastroenterology Consultation  Referring Provider:     Waunita Schooner, MD Primary Care Physician:  Roberta Curtis, [place] Primary Gastroenterologist:  Dr. Jonathon Curtis  Reason for Consultation:     Constipation        HPI:   Roberta Curtis is a 81 y.o. y/o female referred for consultation & management  by Roberta Curtis, [place].    She says that she has a history of leukemia and undergone a bone marrow transplant follows with Dr. Tasia Curtis.  She says that she was doing well and over the last few months has developed significant constipation.  When she has to have a bowel movement has to strain a lot denies any change in the shape of the stool denies any blood in the stool denies any unintentional weight loss last colonoscopy was many years back.  Has tried over-the-counter stool softeners.  Diet is not rich in fiber.  12/10/2021 CT abdomen showed no abdominal abnormalities 08/06/2022 hemoglobin 11.9 g with an MCV of 75 low since past 8 months.  Ferritin in 04/26/2022 was 36 iron studies were normal.  B12 normal.  CMP normal.  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HCC)    GERD (gastroesophageal reflux disease)    Heart murmur    Osteopenia    Thrush     Past Surgical History:  Procedure Laterality Date   BONE MARROW TRANSPLANT     BREAST LUMPECTOMY Left    fatty tumor,1970's   CATARACT EXTRACTION     CESAREAN SECTION     HYSTEROTOMY     LYMPH GLAND EXCISION     MENISCUS REPAIR Right    NECK SURGERY     SALPINGECTOMY     WISDOM TOOTH EXTRACTION      Prior to Admission medications   Medication Sig Start Date End Date Taking? Authorizing Provider  albuterol (VENTOLIN HFA) 108 (90 Base) MCG/ACT inhaler Inhale 1-2 puffs into the lungs as needed for wheezing or shortness of breath. 05/25/22   Roberta Schooner, MD  aspirin EC 81 MG tablet  Take 81 mg by mouth daily. Swallow whole.    [provider]  Biotin 1 MG CAPS Take 1,000 mcg by mouth daily.     [provider]  calcium carbonate (OS-CAL - DOSED IN MG OF ELEMENTAL CALCIUM) 1250 (500 Ca) MG tablet Take 1 tablet by mouth daily.    [provider]  Cholecalciferol (VITAMIN D3) 50 MCG (2000 UT) CAPS Take 4,000 Units by mouth daily.    [provider]  cyanocobalamin (,VITAMIN B-12,) 1000 MCG/ML injection Inject 1 mL (1000 mcg total) into the muscle on the following dates: 02/16/22, 02/19/22, 02/22/22, then weekly after that for 5 doses on approximately 03/01/22, 03/08/22, 03/15/22, 03/22/22, 03/29/22 02/16/22   Emeterio Reeve, DO  cyclobenzaprine (FLEXERIL) 5 MG tablet Take 5 mg by mouth at bedtime. 08/11/22   [provider]  esomeprazole (NEXIUM) 20 MG capsule Take 1 capsule (20 mg total) by mouth daily at 12 noon. 03/23/22   Roberta Schooner, MD  ibandronate (BONIVA) 150 MG tablet TAKE 1 TABLET (150 MG TOTAL) BY MOUTH EVERY 30 (THIRTY) DAYS. 04/21/22   Roberta Schooner, MD  magnesium oxide (MAG-OX) 400 (240 Mg) MG tablet Take 400 mg by mouth daily.    [provider]  meclizine (ANTIVERT) 12.5 MG tablet  Take 1 tablet (12.5 mg total) by mouth 3 (three) times daily as needed. 08/11/22   Roberta Pancoast, FNP  methocarbamol (ROBAXIN) 500 MG tablet Take 1 tablet (500 mg total) by mouth every 8 (eight) hours as needed for muscle spasms. 02/14/22   Emeterio Reeve, DO  methylPREDNISolone (MEDROL DOSEPAK) 4 MG TBPK tablet Steroid taper. Take as directed by packaging. 05/24/22   Immordino, Annie Main, FNP    Family History  Problem Relation Age of Onset   Retinitis pigmentosa Mother    Stroke Mother    Liver cancer Father    Heart disease Father    Heart disease Brother    Blindness Brother      Social History   Tobacco Use   Smoking status: Former    Packs/day: 1.00    Years: 6.00    Total pack years: 6.00    Types: Cigarettes     Quit date: 02/20/1970    Years since quitting: 52.5   Smokeless tobacco: Never  Vaping Use   Vaping Use: Never used  Substance Use Topics   Alcohol use: Not Currently   Drug use: Never    Allergies as of 08/25/2022 - Review Complete 08/25/2022  Allergen Reaction Noted   Jadenu [deferasirox] Other (See Comments) 02/12/2020   Ketotifen fumarate  02/16/2022   Levofloxacin Other (See Comments) 01/03/2020   Ondansetron  02/16/2022   Ondansetron hcl Other (See Comments) 01/03/2020   Codeine Palpitations 01/03/2020   Oxycodone-acetaminophen Palpitations and Other (See Comments) 01/03/2020   Penicillins Rash 01/03/2020    Review of Systems:    All systems reviewed and negative except where noted in HPI.   Physical Exam:  BP 129/70   Pulse 76   Temp 97.9 F (36.6 C) (Oral)   Ht '5\' 3"'$  (1.6 m)   Wt 136 lb 6.4 oz (61.9 kg)   BMI 24.16 kg/m  No LMP recorded. Patient has had a hysterectomy. Psych:  Alert and cooperative. Normal mood and affect. General:   Alert,  Well-developed, well-nourished, pleasant and cooperative in NAD Head:  Normocephalic and atraumatic. Eyes:  Sclera clear, no icterus.   Conjunctiva pink. Ears:  Normal auditory acuity. Neurologic:  Alert and oriented x3;  grossly normal neurologically. Psych:  Alert and cooperative. Normal mood and affect.  Imaging Studies: No results found.  Assessment and Plan:   Roberta Curtis is a 81 y.o. y/o female has been referred for constipation which is new in onset history of leukemia status post bone marrow transplant..  Plan 1.  With change in bowel habits will perform colonoscopy 2.  Check TSH 3.  High-fiber diet 4.  Commence on MiraLAX samples provided 1 packet twice a day if it works to buy over-the-counter if it does not work we will commence on [place]  I have discussed alternative options, risks & benefits,  which include, but are not limited to, bleeding, infection, perforation,respiratory complication & drug  reaction.  The patient agrees with this plan & written consent will be obtained.     Follow up in 1 month  Dr Roberta Bellows MD,MRCP(U.K)

## 2022-08-26 LAB — TSH: TSH: 1.49 u[IU]/mL (ref 0.450–4.500)

## 2022-09-03 ENCOUNTER — Other Ambulatory Visit: Payer: Self-pay

## 2022-09-03 DIAGNOSIS — C9201 Acute myeloblastic leukemia, in remission: Secondary | ICD-10-CM

## 2022-09-08 ENCOUNTER — Telehealth: Payer: Self-pay

## 2022-09-08 NOTE — Telephone Encounter (Signed)
Pt has been scheduled for bone marrow biopsy on 1/25 @ 8:30am. Pt aware of appt.

## 2022-09-16 ENCOUNTER — Ambulatory Visit: Payer: Medicare HMO

## 2022-09-18 ENCOUNTER — Telehealth: Payer: Self-pay | Admitting: Gastroenterology

## 2022-09-18 NOTE — Telephone Encounter (Signed)
This morning patient was contacted initially to reschedule her colonoscopy from 09/22/22 due to change in schedule Dr. Vicente Males would not be available.  Colonoscopy has been rescheduled to 10/06/22.  However patient has called back to request her follow up appt to be rescheduled because her follow up office visit was supposed to be 1 week after her colonoscopy.   I explained to her that unfortunately we do not have any available follow up appts sooner than 10/29/22 and this was discussed with Dr. Georgeann Oppenheim CMA-[place].  She said she does not want to have to wait a whole month for a follow up.  I informed patient that if after her colonoscopy Dr. Vicente Males thought she would need to be seen in the office sooner-he would have his [place] to work her in. Patient wanted me to make Dr. Vicente Males aware of this because its very upsetting to her.  Thanks,  [place], [place]

## 2022-09-18 NOTE — Telephone Encounter (Signed)
Patient called stating that she got a call that she needed to reschedule her colonoscopy. Patient is requesting a call back.

## 2022-09-18 NOTE — Telephone Encounter (Signed)
Lets see what the colonoscopy shows if anything of concerrn will see her sooner , I will be discussing with her right after the procedure as well tellling her the plan

## 2022-09-18 NOTE — Telephone Encounter (Signed)
I scheduled patient to be seen on 10/29/2022 at 1:45 PM as it was the only day that Dr. Vicente Males only had 9 patients that day. Now, if Dr. Vicente Males wants me to schedule patient to another day prior to, he will need to let me know where to place her as from now on Dr. Vicente Males had requested to only see 10 patients an afternoon. Please advise.

## 2022-09-25 ENCOUNTER — Encounter: Payer: Self-pay | Admitting: Family

## 2022-09-25 ENCOUNTER — Ambulatory Visit (INDEPENDENT_AMBULATORY_CARE_PROVIDER_SITE_OTHER): Payer: Medicare HMO | Admitting: Family

## 2022-09-25 VITALS — BP 138/68 | HR 65 | Temp 99.1°F | Ht 63.0 in | Wt 139.0 lb

## 2022-09-25 DIAGNOSIS — R300 Dysuria: Secondary | ICD-10-CM | POA: Diagnosis not present

## 2022-09-25 DIAGNOSIS — R311 Benign essential microscopic hematuria: Secondary | ICD-10-CM | POA: Insufficient documentation

## 2022-09-25 DIAGNOSIS — N3001 Acute cystitis with hematuria: Secondary | ICD-10-CM | POA: Diagnosis not present

## 2022-09-25 LAB — POCT URINALYSIS DIPSTICK
Bilirubin, UA: NEGATIVE
Glucose, UA: NEGATIVE
Ketones, UA: NEGATIVE
Nitrite, UA: NEGATIVE
Protein, UA: POSITIVE — AB
Spec Grav, UA: 1.015 (ref 1.010–1.025)
Urobilinogen, UA: 0.2 E.U./dL
pH, UA: 6 (ref 5.0–8.0)

## 2022-09-25 MED ORDER — SULFAMETHOXAZOLE-TRIMETHOPRIM 800-160 MG PO TABS: 1.0000 | ORAL_TABLET | Freq: Two times a day (BID) | ORAL | 0 refills | Status: AC

## 2022-09-25 NOTE — Progress Notes (Signed)
Established Patient Office Visit  Subjective:   Patient ID: Roberta Curtis, female    DOB: 1940-10-22  Age: 82 y.o. MRN: [id number]  CC:  Chief Complaint  Patient presents with   Dysuria    X 2 days     HPI: Roberta Curtis is a 82 y.o. female presenting on 09/25/2022 for Dysuria (X 2 days )  2 days ago started with dysuria and with some suprapubic tenderness. No fever or chills. No flank pain.   Increased urinary frequency, wasn't able to make it to the bathroom last night either. Increased urgency.   She thinks she might have a UTI.   Has f/u with her hematologist for repeat bone marrow.   Also has a colonoscopy on the 30th of January. Seeing Dr. Jonathon Bellows.   Urine dip today with positive protein, 3+ blood and large amount of leukocytes.     ROS: Negative unless specifically indicated above in HPI.   Relevant past medical history reviewed and updated as indicated.   Allergies and medications reviewed and updated.   Current Outpatient Medications:    albuterol (VENTOLIN HFA) 108 (90 Base) MCG/ACT inhaler, Inhale 1-2 puffs into the lungs as needed for wheezing or shortness of breath., Disp: 6.7 g, Rfl: 1   aspirin EC 81 MG tablet, Take 81 mg by mouth daily. Swallow whole., Disp: , Rfl:    Biotin 1 MG CAPS, Take 1,000 mcg by mouth daily. , Disp: , Rfl:    calcium carbonate (OS-CAL - DOSED IN MG OF ELEMENTAL CALCIUM) 1250 (500 Ca) MG tablet, Take 1 tablet by mouth daily., Disp: , Rfl:    Cholecalciferol (VITAMIN D3) 50 MCG (2000 UT) CAPS, Take 4,000 Units by mouth daily., Disp: , Rfl:    cyanocobalamin (VITAMIN B12) 500 MCG tablet, Take 500 mcg by mouth daily., Disp: , Rfl:    cyclobenzaprine (FLEXERIL) 5 MG tablet, Take 5 mg by mouth at bedtime., Disp: , Rfl:    esomeprazole (NEXIUM) 20 MG capsule, Take 1 capsule (20 mg total) by mouth daily at 12 noon., Disp: 90 capsule, Rfl: 1   ibandronate (BONIVA) 150 MG tablet, TAKE 1 TABLET (150 MG TOTAL) BY MOUTH EVERY 30  (THIRTY) DAYS., Disp: 3 tablet, Rfl: 1   magnesium oxide (MAG-OX) 400 (240 Mg) MG tablet, Take 400 mg by mouth daily., Disp: , Rfl:    meclizine (ANTIVERT) 12.5 MG tablet, Take 1 tablet (12.5 mg total) by mouth 3 (three) times daily as needed., Disp: 30 tablet, Rfl: 0   sulfamethoxazole-trimethoprim (BACTRIM DS) 800-160 MG tablet, Take 1 tablet by mouth 2 (two) times daily for 7 days., Disp: 14 tablet, Rfl: 0  Allergies  Allergen Reactions   Jadenu [Deferasirox] Other (See Comments)    Elevated Iron, headache and rash   Ketotifen Fumarate    Levofloxacin Other (See Comments)   Ondansetron     Other Reaction(s): Not available   Ondansetron Hcl Other (See Comments)   Codeine Palpitations   Oxycodone-Acetaminophen Palpitations and Other (See Comments)   Penicillins Rash    Objective:   BP 138/68   Pulse 65   Temp 99.1 F (37.3 C) (Oral)   Ht '5\' 3"'$  (1.6 m)   Wt 139 lb (63 kg)   SpO2 98%   BMI 24.62 kg/m    Physical Exam Constitutional:      General: She is not in acute distress.    Appearance: Normal appearance. She is normal weight. She is not ill-appearing, toxic-appearing or  diaphoretic.  Cardiovascular:     Rate and Rhythm: Normal rate.  Pulmonary:     Effort: Pulmonary effort is normal.  Abdominal:     General: Abdomen is flat.     Tenderness: There is no abdominal tenderness. There is no right CVA tenderness or left CVA tenderness.  Neurological:     General: No focal deficit present.     Mental Status: She is alert and oriented to person, place, and time. Mental status is at baseline.     Motor: No weakness.  Psychiatric:        Mood and Affect: Mood normal.        Behavior: Behavior normal.        Thought Content: Thought content normal.        Judgment: Judgment normal.     Assessment & Plan:  Acute cystitis with hematuria Assessment & Plan: poct urine dip in office Urine culture ordered pending results antbx sent to pharmacy, pt to take as directed.  Encouraged increased water intake throughout the day. Choosing to treat due to being symptomatic. If no improvement in the next 2 days pt advised to let me know.  Rx bactrim 800/160 mg po bid x 7 days.   Orders: -     Urinalysis, Routine w reflex microscopic -     Urine Culture -     Sulfamethoxazole-Trimethoprim; Take 1 tablet by mouth 2 (two) times daily for 7 days.  Dispense: 14 tablet; Refill: 0  Dysuria -     POCT urinalysis dipstick -     Urinalysis, Routine w reflex microscopic -     Urine Culture     Follow up plan: No follow-ups on file.  Roberta Pancoast, FNP

## 2022-09-25 NOTE — Patient Instructions (Signed)
  It was a pleasure seeing you today.   You were found to have a urinary tract infection, you have been prescribed an antibiotic to your preferred pharmacy. Please start antibiotic today as directed.   We are sending your urine for a culture to make sure you do not have a resistant bacteria. We will call you if we need to change your medications.   Please make sure you are drinking plenty of fluids over the next few days.  If your symptoms do not improve over the next 5-7 days, or if they worsen, please let us know. Please also let us know if you have worsening back pain, fevers, chills, or body aches.   Regards,   Norvin Ohlin  

## 2022-09-25 NOTE — Assessment & Plan Note (Signed)
poct urine dip in office Urine culture ordered pending results antbx sent to pharmacy, pt to take as directed. Encouraged increased water intake throughout the day. Choosing to treat due to being symptomatic. If no improvement in the next 2 days pt advised to let me know.  Rx bactrim 800/160 mg po bid x 7 days.

## 2022-09-27 LAB — URINE CULTURE
MICRO NUMBER:: 14448268
SPECIMEN QUALITY:: ADEQUATE

## 2022-09-28 NOTE — Addendum Note (Signed)
Addended by: Ellamae Sia on: 09/28/2022 07:30 AM   Modules accepted: Orders

## 2022-09-29 ENCOUNTER — Ambulatory Visit: Payer: Medicare HMO | Admitting: Gastroenterology

## 2022-09-30 ENCOUNTER — Other Ambulatory Visit: Payer: Self-pay | Admitting: Internal Medicine

## 2022-09-30 DIAGNOSIS — C9201 Acute myeloblastic leukemia, in remission: Secondary | ICD-10-CM

## 2022-09-30 NOTE — Progress Notes (Signed)
Patient for Bone Marrow Biopsy on Thurs 10/01/2022, I called and spoke with the patient on the  phone and gave pre-procedure instructions. Pt was made aware to be here at 7:30a at the new entrance, NPO after MN prior to procedure as well as driver post procedure/recovery/discharge. Pt stated understanding.  Called 09/30/2022

## 2022-09-30 NOTE — H&P (Signed)
Chief Complaint: Patient was seen in consultation today for bone marrow biopsy with aspiration  Referring Physician(s): Yu,Zhou  Supervising Physician: Mir, Sharen Heck  Patient Status: [facility] - Out-pt  History of Present Illness: Roberta Curtis is an 82 y.o. female with a medical history significant for AML in remission. She states she has been in remission for 16 years but has required yearly bone marrow biopsies for surveillance. She is familiar to IR from several bone marrow biopsies/aspirations with the last one on 09/29/21. Her oncology team has requested a routine surveillance bone marrow biopsy with aspiration. The patient also stated that if today's study is negative for any concerning findings it will be her last one.    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HCC)    GERD (gastroesophageal reflux disease)    Heart murmur    Osteopenia    Thrush     Past Surgical History:  Procedure Laterality Date   BONE MARROW TRANSPLANT     BREAST LUMPECTOMY Left    fatty tumor,1970's   CATARACT EXTRACTION     CESAREAN SECTION     HYSTEROTOMY     LYMPH GLAND EXCISION     MENISCUS REPAIR Right    NECK SURGERY     SALPINGECTOMY     WISDOM TOOTH EXTRACTION      Allergies: Jadenu [deferasirox], Ketotifen fumarate, Levofloxacin, Ondansetron hcl, Codeine, Oxycodone-acetaminophen, and Penicillins  Medications: Prior to Admission medications   Medication Sig Start Date End Date Taking? Authorizing Provider  albuterol (VENTOLIN HFA) 108 (90 Base) MCG/ACT inhaler Inhale 1-2 puffs into the lungs as needed for wheezing or shortness of breath. 05/25/22   Waunita Schooner, MD  aspirin EC 81 MG tablet Take 81 mg by mouth daily. Swallow whole.    [provider]  Biotin 1 MG CAPS Take 1,000 mcg by mouth daily.     [provider]  calcium carbonate (OS-CAL - DOSED IN MG OF ELEMENTAL CALCIUM) 1250 (500 Ca) MG tablet Take 1 tablet by mouth daily.     [provider]  Cholecalciferol (VITAMIN D3) 50 MCG (2000 UT) CAPS Take 4,000 Units by mouth daily.    [provider]  cyanocobalamin (VITAMIN B12) 500 MCG tablet Take 500 mcg by mouth daily.    [provider]  cyclobenzaprine (FLEXERIL) 5 MG tablet Take 5 mg by mouth at bedtime. 08/11/22   [provider]  esomeprazole (NEXIUM) 20 MG capsule Take 1 capsule (20 mg total) by mouth daily at 12 noon. 03/23/22   Waunita Schooner, MD  ibandronate (BONIVA) 150 MG tablet TAKE 1 TABLET (150 MG TOTAL) BY MOUTH EVERY 30 (THIRTY) DAYS. 04/21/22   Waunita Schooner, MD  magnesium oxide (MAG-OX) 400 (240 Mg) MG tablet Take 400 mg by mouth daily.    [provider]  meclizine (ANTIVERT) 12.5 MG tablet Take 1 tablet (12.5 mg total) by mouth 3 (three) times daily as needed. 08/11/22   [name], FNP  sulfamethoxazole-trimethoprim (BACTRIM DS) 800-160 MG tablet Take 1 tablet by mouth 2 (two) times daily for 7 days. 09/25/22 10/02/22  [name], FNP     Family History  Problem Relation Age of Onset   Retinitis pigmentosa Mother    Stroke Mother    Liver cancer Father    Heart disease Father    Heart disease Brother    Blindness Brother     Social History   Socioeconomic History   Marital status: Widowed  Spouse name: Alvis Lemmings   Number of children: 3   Years of education: high school   Highest education level: Not on file  Occupational History   Occupation: retired  Tobacco Use   Smoking status: Former    Packs/day: 1.00    Years: 6.00    Total pack years: 6.00    Types: Cigarettes    Quit date: 02/20/1970    Years since quitting: 52.6   Smokeless tobacco: Never  Vaping Use   Vaping Use: Never used  Substance and Sexual Activity   Alcohol use: Not Currently   Drug use: Never   Sexual activity: Not Currently    Partners: Male    Birth control/protection: Post-menopausal  Other Topics Concern   Not on file  Social History Narrative       Right handed   Lives in a condo with domestic partner   From [place]      02/12/20   From: [place], [place] be near [place]: with [place] Proxy: Teresita Madura (daughter)   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      Family: Product/process development scientist, lives in [place]), [place] ([place], [place]), [place] ([place])      Enjoys: meet new people, socialable      Exercise: not currently - cleaning   Diet: picky eater - eats veggies - but her husband doesn't like these things      Safety   Seat belts: Yes    Guns: Yes  and secure   Safe in relationships: Yes    Social Determinants of Health   Financial Resource Strain: Low Risk  (02/18/2021)   Overall Financial Resource Strain (CARDIA)    Difficulty of Paying Living Expenses: Not hard at all  Food Insecurity: No Food Insecurity (02/18/2021)   Hunger Vital Sign    Worried About Running Out of Food in the Last Year: Never true    [place] in the Last Year: Never true  Transportation Needs: No Transportation Needs (02/18/2021)   PRAPARE - Hydrologist (Medical): No    Lack of Transportation (Non-Medical): No  Physical Activity: Inactive (02/18/2021)   Exercise Vital Sign    Days of Exercise per Week: 0 days    Minutes of Exercise per Session: 0 min  Stress: No Stress Concern Present (02/18/2021)   [place]    Feeling of Stress : Not at all  Social Connections: Not on file    Review of Systems: A 12 point ROS discussed and pertinent positives are indicated in the HPI above.  All other systems are negative.  Review of Systems  Constitutional:  Positive for fatigue. Negative for appetite change.  Respiratory:  Negative for cough and shortness of breath.   Cardiovascular:  Negative for chest pain and leg swelling.  Gastrointestinal:  Positive for diarrhea. Negative for  abdominal pain, nausea and vomiting.  Genitourinary:  Negative for flank pain.  Musculoskeletal:  Negative for back pain.  Neurological:  Negative for dizziness and headaches.    Vital Signs: BP (!) 140/63   Pulse 88   Temp 97.9 F (36.6 C)   Resp 16   Ht '5\' 3"'$  (1.6 m)   Wt 137 lb (62.1 kg)   SpO2 98%   BMI 24.27 kg/m   Physical Exam Constitutional:      General: She is not  in acute distress.    Appearance: She is not ill-appearing.  HENT:     Mouth/Throat:     Mouth: Mucous membranes are moist.     Pharynx: Oropharynx is clear.  Cardiovascular:     Rate and Rhythm: Normal rate and regular rhythm.     Pulses: Normal pulses.     Heart sounds: Normal heart sounds.  Pulmonary:     Effort: Pulmonary effort is normal.     Breath sounds: Normal breath sounds.  Abdominal:     General: Bowel sounds are normal.     Palpations: Abdomen is soft.     Tenderness: There is no abdominal tenderness.  Musculoskeletal:     Right lower leg: No edema.     Left lower leg: No edema.  Skin:    General: Skin is warm and dry.  Neurological:     General: No focal deficit present.     Mental Status: She is alert and oriented to person, place, and time.  Psychiatric:        Mood and Affect: Mood normal.        Behavior: Behavior normal.        Thought Content: Thought content normal.        Judgment: Judgment normal.     Imaging: No results found.  Labs:  CBC: Recent Labs    02/24/22 1237 04/07/22 1243 08/06/22 0952 10/01/22 0737  WBC 6.9 8.3 6.4 6.4  HGB 11.8* 11.7* 11.9* 11.7*  HCT 37.9 36.6 37.7 35.6*  PLT 268.0 218 256.0 267    COAGS: Recent Labs    02/08/22 1613  INR 1.1  APTT 28    BMP: Recent Labs    12/20/21 1300 02/08/22 1613 02/12/22 0616 02/24/22 1237 04/07/22 1243  NA 139 141 136 140 139  K 3.7 3.9 3.9 4.3 3.9  CL 109 114* 109 107 105  CO2 '22 23 23 27 28  '$ GLUCOSE 87 101* 100* 104* 104*  BUN 15 14 27* 13 18  CALCIUM 9.0 8.5* 9.6 9.5 9.7   CREATININE 0.79 0.76 0.70 0.86 0.75  GFRNONAA >60 >60 >60  --  >60    LIVER FUNCTION TESTS: Recent Labs    10/06/21 1358 12/20/21 1300 02/08/22 1613 04/07/22 1243  BILITOT 0.3 0.9 0.6 0.6  AST '19 27 24 20  '$ ALT '12 16 12 15  '$ ALKPHOS 56 56 61 58  PROT 7.0 6.9 7.4 7.0  ALBUMIN 3.6 3.7 4.0 3.9    TUMOR MARKERS: No results for input(s): "AFPTM", "CEA", "CA199", "CHROMGRNA" in the last 8760 hours.  Assessment and Plan:  History of AML; requires annual surveillance bone marrow biopsy with aspiration: Charlynne Pander, 82 year old female presents today to the [facility] Interventional Radiology department for an image-guided bone marrow biopsy with aspiration.  Risks and benefits of bone marrow biopsy with aspiration were discussed with the patient and/or patient's family including, but not limited to bleeding, infection, damage to adjacent structures or low yield requiring additional tests.  All of the questions were answered and there is agreement to proceed. She has been NPO.   Consent signed and in chart.  Thank you for this interesting consult.  I greatly enjoyed meeting Journii Nierman [facility] and look forward to participating in their care.  A copy of this report was sent to the requesting provider on this date.  Electronically Signed: Soyla Dryer, AGACNP-BC [phone] 10/01/2022, 8:30 AM   I spent a total of  30 Minutes  in face to face in clinical consultation, greater than 50% of which was counseling/coordinating care for bone marrow biopsy with aspiration

## 2022-10-01 ENCOUNTER — Ambulatory Visit
Admission: RE | Admit: 2022-10-01 | Discharge: 2022-10-01 | Disposition: A | Discharge status: Home / Self Care | Payer: Medicare HMO | Source: Ambulatory Visit | Attending: Oncology | Admitting: Oncology

## 2022-10-01 DIAGNOSIS — D509 Iron deficiency anemia, unspecified: Secondary | ICD-10-CM | POA: Insufficient documentation

## 2022-10-01 DIAGNOSIS — C9201 Acute myeloblastic leukemia, in remission: Secondary | ICD-10-CM | POA: Insufficient documentation

## 2022-10-01 LAB — CBC WITH DIFFERENTIAL/PLATELET
Abs Immature Granulocytes: 0.02 10*3/uL (ref 0.00–0.07)
Basophils Absolute: 0 10*3/uL (ref 0.0–0.1)
Basophils Relative: 1 %
Eosinophils Absolute: 0.2 10*3/uL (ref 0.0–0.5)
Eosinophils Relative: 3 %
HCT: 35.6 % — ABNORMAL LOW (ref 36.0–46.0)
Hemoglobin: 11.7 g/dL — ABNORMAL LOW (ref 12.0–15.0)
Immature Granulocytes: 0 %
Lymphocytes Relative: 38 %
Lymphs Abs: 2.5 10*3/uL (ref 0.7–4.0)
MCH: 23.9 pg — ABNORMAL LOW (ref 26.0–34.0)
MCHC: 32.9 g/dL (ref 30.0–36.0)
MCV: 72.7 fL — ABNORMAL LOW (ref 80.0–100.0)
Monocytes Absolute: 0.6 10*3/uL (ref 0.1–1.0)
Monocytes Relative: 9 %
Neutro Abs: 3.1 10*3/uL (ref 1.7–7.7)
Neutrophils Relative %: 49 %
Platelets: 267 10*3/uL (ref 150–400)
RBC: 4.9 MIL/uL (ref 3.87–5.11)
RDW: 15.1 % (ref 11.5–15.5)
WBC: 6.4 10*3/uL (ref 4.0–10.5)
nRBC: 0 % (ref 0.0–0.2)

## 2022-10-01 MED ORDER — HEPARIN SOD (PORK) LOCK FLUSH 100 UNIT/ML IV SOLN
INTRAVENOUS | Status: AC
  Administered 2022-10-01: 500 [IU]
  Filled 2022-10-01: qty 5

## 2022-10-01 MED ORDER — MIDAZOLAM HCL 5 MG/5ML IJ SOLN
INTRAMUSCULAR | Status: AC | PRN
  Administered 2022-10-01 (×2): 1 mg via INTRAVENOUS

## 2022-10-01 MED ORDER — SODIUM CHLORIDE 0.9 % IV SOLN: INTRAVENOUS | Status: DC

## 2022-10-01 MED ORDER — FENTANYL CITRATE (PF) 100 MCG/2ML IJ SOLN
INTRAMUSCULAR | Status: AC | PRN
  Administered 2022-10-01 (×2): 50 ug via INTRAVENOUS

## 2022-10-01 MED ORDER — FENTANYL CITRATE (PF) 100 MCG/2ML IJ SOLN
INTRAMUSCULAR | Status: AC
  Filled 2022-10-01: qty 2

## 2022-10-01 MED ORDER — MIDAZOLAM HCL 2 MG/2ML IJ SOLN
INTRAMUSCULAR | Status: AC
  Filled 2022-10-01: qty 2

## 2022-10-01 NOTE — Progress Notes (Signed)
Patient clinically stable post BMB per Dr Dwaine Gale. Tolerated well with vitals stable pre and post procedure. Received Versed 2 mg along with Fentanyl 100 mcg IV for procedure. Report given to Gari Crown Rn post procedure/331.

## 2022-10-01 NOTE — Procedures (Signed)
Interventional Radiology Procedure Note  Indication: AML  Procedure: CT guided aspirate and core biopsy of right iliac bone  Complications: None  Bleeding: Minimal  Miachel Roux, MD [phone]

## 2022-10-05 LAB — SURGICAL PATHOLOGY

## 2022-10-06 ENCOUNTER — Encounter: Payer: Self-pay | Admitting: Gastroenterology

## 2022-10-06 ENCOUNTER — Ambulatory Visit: Payer: Medicare HMO | Admitting: Certified Registered"

## 2022-10-06 ENCOUNTER — Ambulatory Visit
Admission: RE | Admit: 2022-10-06 | Discharge: 2022-10-06 | Disposition: A | Discharge status: Home / Self Care | Payer: Medicare HMO | Attending: Gastroenterology | Admitting: Gastroenterology

## 2022-10-06 ENCOUNTER — Encounter
Admission: RE | Disposition: A | Discharge status: Home / Self Care | Payer: Self-pay | Source: Home / Self Care | Attending: Gastroenterology

## 2022-10-06 DIAGNOSIS — K5900 Constipation, unspecified: Secondary | ICD-10-CM | POA: Insufficient documentation

## 2022-10-06 DIAGNOSIS — D126 Benign neoplasm of colon, unspecified: Secondary | ICD-10-CM

## 2022-10-06 DIAGNOSIS — D122 Benign neoplasm of ascending colon: Secondary | ICD-10-CM | POA: Diagnosis not present

## 2022-10-06 DIAGNOSIS — Z87891 Personal history of nicotine dependence: Secondary | ICD-10-CM | POA: Insufficient documentation

## 2022-10-06 DIAGNOSIS — K219 Gastro-esophageal reflux disease without esophagitis: Secondary | ICD-10-CM | POA: Diagnosis not present

## 2022-10-06 DIAGNOSIS — K5909 Other constipation: Secondary | ICD-10-CM

## 2022-10-06 DIAGNOSIS — R194 Change in bowel habit: Secondary | ICD-10-CM | POA: Diagnosis not present

## 2022-10-06 DIAGNOSIS — K5730 Diverticulosis of large intestine without perforation or abscess without bleeding: Secondary | ICD-10-CM | POA: Insufficient documentation

## 2022-10-06 HISTORY — PX: COLONOSCOPY WITH PROPOFOL: SHX5780

## 2022-10-06 SURGERY — COLONOSCOPY WITH PROPOFOL
Anesthesia: General

## 2022-10-06 MED ORDER — LIDOCAINE HCL (PF) 2 % IJ SOLN
INTRAMUSCULAR | Status: AC
  Filled 2022-10-06: qty 5

## 2022-10-06 MED ORDER — SODIUM CHLORIDE 0.9 % IV SOLN
INTRAVENOUS | Status: DC
  Administered 2022-10-06: 1000 mL via INTRAVENOUS

## 2022-10-06 MED ORDER — PROPOFOL 10 MG/ML IV BOLUS
INTRAVENOUS | Status: AC
  Filled 2022-10-06: qty 40

## 2022-10-06 MED ORDER — PROPOFOL 10 MG/ML IV BOLUS
INTRAVENOUS | Status: DC | PRN
  Administered 2022-10-06: 145 ug/kg/min via INTRAVENOUS
  Administered 2022-10-06: 80 mg via INTRAVENOUS

## 2022-10-06 MED ORDER — LIDOCAINE HCL (CARDIAC) PF 100 MG/5ML IV SOSY
PREFILLED_SYRINGE | INTRAVENOUS | Status: DC | PRN
  Administered 2022-10-06: 100 mg via INTRAVENOUS

## 2022-10-06 MED ORDER — STERILE WATER FOR IRRIGATION IR SOLN
Status: DC | PRN
  Administered 2022-10-06: 60 mL

## 2022-10-06 NOTE — Anesthesia Postprocedure Evaluation (Signed)
Anesthesia Post Note  Patient: Roberta Curtis  Procedure(s) Performed: COLONOSCOPY WITH PROPOFOL  Patient location during evaluation: Endoscopy Anesthesia Type: General Level of consciousness: awake and alert Pain management: pain level controlled Vital Signs Assessment: post-procedure vital signs reviewed and stable Respiratory status: spontaneous breathing, nonlabored ventilation, respiratory function stable and patient connected to nasal cannula oxygen Cardiovascular status: blood pressure returned to baseline and stable Postop Assessment: no apparent nausea or vomiting Anesthetic complications: no   No notable events documented.   Last Vitals:  Vitals:   10/06/22 1027 10/06/22 1045  BP: (!) 104/51 108/74  Pulse: 73 76  Resp: 15   Temp: (!) 36 C   SpO2: 100% 100%    Last Pain:  Vitals:   10/06/22 1045  TempSrc:   PainSc: 0-No pain                 Arita Miss

## 2022-10-06 NOTE — Op Note (Signed)
[facility] Gastroenterology Patient [facility]: [facility] Procedure Date: 10/06/2022 10:09 AM MRN: [id number] Account #: [id number] Date of Birth: Sep 28, 1940 Admit Type: Outpatient Age: 82 Room: [facility] ENDO ROOM 2 Gender: Female Note Status: Finalized Instrument Name: Peds Colonoscope [id number] Procedure:             Colonoscopy Indications:           Change in bowel habits Providers:             Jonathon Bellows MD, MD Referring MD:          [name] (Referring MD) Medicines:             Monitored Anesthesia Care Complications:         No immediate complications. Procedure:             Pre-Anesthesia Assessment:                        - Prior to the procedure, a History and Physical was                         performed, and patient medications, allergies and                         sensitivities were reviewed. The patient's tolerance                         of previous anesthesia was reviewed.                        - The risks and benefits of the procedure and the                         sedation options and risks were discussed with the                         patient. All questions were answered and informed                         consent was obtained.                        - ASA Grade Assessment: II - A patient with mild                         systemic disease.                        After obtaining informed consent, the colonoscope was                         passed under direct vision. Throughout the procedure,                         the patient's blood pressure, pulse, and oxygen                         saturations were monitored continuously. The                         Colonoscope was introduced  through the anus and                         advanced to the the cecum, identified by the                         appendiceal orifice. The colonoscopy was performed                         with ease. The patient tolerated the procedure well.                          The quality of the bowel preparation was good. The                         ileocecal valve, appendiceal orifice, and rectum were                         photographed. Findings:      The perianal and digital rectal examinations were normal.      Two sessile polyps were found in the ascending colon. The polyps were 4       to 5 mm in size. These polyps were removed with a cold snare. Resection       and retrieval were complete.      Multiple medium-mouthed diverticula were found in the left colon.      The exam was otherwise without abnormality on direct and retroflexion       views. Impression:            - Two 4 to 5 mm polyps in the ascending colon, removed                         with a cold snare. Resected and retrieved.                        - Diverticulosis in the left colon.                        - The examination was otherwise normal on direct and                         retroflexion views. Recommendation:        - Discharge patient to home (with escort).                        - Resume previous diet.                        - Continue present medications.                        - Await pathology results.                        - Repeat colonoscopy is not recommended due to current                         age (23 years or older) for surveillance. Procedure Code(s):     --- Professional ---  45385, Colonoscopy, flexible; with removal of                         tumor(s), polyp(s), or other lesion(s) by snare                         technique Diagnosis Code(s):     --- Professional ---                        D12.2, Benign neoplasm of ascending colon                        K57.30, Diverticulosis of large intestine without                         perforation or abscess without bleeding                        R19.4, Change in bowel habit CPT copyright 2022 American Medical Association. All rights reserved. The codes documented in this report are  preliminary and upon coder review may  be revised to meet current compliance requirements. Jonathon Bellows, MD Jonathon Bellows MD, MD 10/06/2022 10:26:32 AM This report has been signed electronically. Number of Addenda: 0 Note Initiated On: 10/06/2022 10:09 AM Scope Withdrawal Time: 0 hours 8 minutes 23 seconds  Total Procedure Duration: 0 hours 11 minutes 2 seconds  Estimated Blood Loss:  Estimated blood loss: none.      [facility]

## 2022-10-06 NOTE — Transfer of Care (Signed)
Immediate Anesthesia Transfer of Care Note  Patient: Roberta Curtis  Procedure(s) Performed: COLONOSCOPY WITH PROPOFOL  Patient Location: PACU  Anesthesia Type:General  Level of Consciousness: drowsy  Airway & Oxygen Therapy: Patient Spontanous Breathing  Post-op Assessment: Report given to RN  Post vital signs: Reviewed and stable  Last Vitals:  Vitals Value Taken Time  BP 104/51 10/06/22 1027  Temp 36 C 10/06/22 1027  Pulse 70 10/06/22 1028  Resp 20 10/06/22 1028  SpO2 100 % 10/06/22 1028  Vitals shown include unvalidated device data.  Last Pain:  Vitals:   10/06/22 1027  TempSrc: Temporal  PainSc: Asleep         Complications: No notable events documented.

## 2022-10-06 NOTE — H&P (Signed)
Roberta Bellows, MD [address] [place], [place], [place], [address] [address], [place], [place], [place], [address] Phone: [phone]  Fax: [phone]  Primary Care Physician:  [name], FNP   Pre-Procedure History & Physical: HPI:  Roberta Curtis is a 82 y.o. female is here for an colonoscopy.   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HCC)    GERD (gastroesophageal reflux disease)    Heart murmur    Osteopenia    Thrush     Past Surgical History:  Procedure Laterality Date   BONE MARROW TRANSPLANT     BREAST LUMPECTOMY Left    fatty tumor,1970's   CATARACT EXTRACTION     CESAREAN SECTION     EYE SURGERY     HYSTEROTOMY     LYMPH GLAND EXCISION     MENISCUS REPAIR Right    NECK SURGERY     SALPINGECTOMY     WISDOM TOOTH EXTRACTION      Prior to Admission medications   Medication Sig Start Date End Date Taking? Authorizing Provider  aspirin EC 81 MG tablet Take 81 mg by mouth daily. Swallow whole.   Yes [provider]  Biotin 1 MG CAPS Take 1,000 mcg by mouth daily.    Yes [provider]  calcium carbonate (OS-CAL - DOSED IN MG OF ELEMENTAL CALCIUM) 1250 (500 Ca) MG tablet Take 1 tablet by mouth daily.   Yes [provider]  Cholecalciferol (VITAMIN D3) 50 MCG (2000 UT) CAPS Take 4,000 Units by mouth daily.   Yes [provider]  cyanocobalamin (VITAMIN B12) 500 MCG tablet Take 500 mcg by mouth daily.   Yes [provider]  ibandronate (BONIVA) 150 MG tablet TAKE 1 TABLET (150 MG TOTAL) BY MOUTH EVERY 30 (THIRTY) DAYS. 04/21/22  Yes Waunita Schooner, MD  magnesium oxide (MAG-OX) 400 (240 Mg) MG tablet Take 400 mg by mouth daily.   Yes [provider]  albuterol (VENTOLIN HFA) 108 (90 Base) MCG/ACT inhaler Inhale 1-2 puffs into the lungs as needed for wheezing or shortness of breath. 05/25/22   Waunita Schooner, MD  cyclobenzaprine (FLEXERIL) 5 MG tablet Take 5 mg by mouth  at bedtime. 08/11/22   [provider]  esomeprazole (NEXIUM) 20 MG capsule Take 1 capsule (20 mg total) by mouth daily at 12 noon. Patient not taking: Reported on 10/06/2022 03/23/22   Waunita Schooner, MD  meclizine (ANTIVERT) 12.5 MG tablet Take 1 tablet (12.5 mg total) by mouth 3 (three) times daily as needed. 08/11/22   [name], FNP    Allergies as of 08/25/2022 - Review Complete 08/25/2022  Allergen Reaction Noted   Jadenu [deferasirox] Other (See Comments) 02/12/2020   Ketotifen fumarate  02/16/2022   Levofloxacin Other (See Comments) 01/03/2020   Ondansetron  02/16/2022   Ondansetron hcl Other (See Comments) 01/03/2020   Codeine Palpitations 01/03/2020   Oxycodone-acetaminophen Palpitations and Other (See Comments) 01/03/2020   Penicillins Rash 01/03/2020    Family History  Problem Relation Age of Onset   Retinitis pigmentosa Mother    Stroke Mother    Liver cancer Father    Heart disease Father    Heart disease Brother    Blindness Brother     Social History   Socioeconomic History   Marital status: Widowed    Spouse name: Alvis Lemmings   Number of children: 3   Years of education: high school   Highest education level: Not  on file  Occupational History   Occupation: retired  Tobacco Use   Smoking status: Former    Packs/day: 1.00    Years: 6.00    Total pack years: 6.00    Types: Cigarettes    Quit date: 02/20/1970    Years since quitting: 52.6   Smokeless tobacco: Never  Vaping Use   Vaping Use: Never used  Substance and Sexual Activity   Alcohol use: Not Currently   Drug use: Never   Sexual activity: Not Currently    Partners: Male    Birth control/protection: Post-menopausal  Other Topics Concern   Not on file  Social History Narrative      Right handed   Lives in a condo with domestic partner   From [place]      02/12/20   From: [place], moved [place] be near [place]: with Alvis Lemmings - husband    Health Care Proxy: Teresita Madura (daughter)   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      Family: Product/process development scientist, lives in [place]), [place] ([place], [place]), [place] ([place])      Enjoys: meet new people, socialable      Exercise: not currently - cleaning   Diet: picky eater - eats veggies - but her husband doesn't like these things      Safety   Seat belts: Yes    Guns: Yes  and secure   Safe in relationships: Yes    Social Determinants of Health   Financial Resource Strain: Low Risk  (02/18/2021)   Overall Financial Resource Strain (CARDIA)    Difficulty of Paying Living Expenses: Not hard at all  Food Insecurity: No Food Insecurity (02/18/2021)   Hunger Vital Sign    Worried About Running Out of Food in the Last Year: Never true    [place] in the Last Year: Never true  Transportation Needs: No Transportation Needs (02/18/2021)   PRAPARE - Hydrologist (Medical): No    Lack of Transportation (Non-Medical): No  Physical Activity: Inactive (02/18/2021)   Exercise Vital Sign    Days of Exercise per Week: 0 days    Minutes of Exercise per Session: 0 min  Stress: No Stress Concern Present (02/18/2021)   [place]    Feeling of Stress : Not at all  Social Connections: Not on file  Intimate Partner Violence: Not At Risk (02/18/2021)   Humiliation, Afraid, Rape, and Kick questionnaire    Fear of Current or Ex-Partner: No    Emotionally Abused: No    Physically Abused: No    Sexually Abused: No    Review of Systems: See HPI, otherwise negative ROS  Physical Exam: BP 133/83   Pulse 77   Temp (!) 96.9 F (36.1 C) (Temporal)   Resp 16   Ht 5' 2.5" (1.588 m)   Wt 60.9 kg   SpO2 100%   BMI 24.17 kg/m  General:   Alert,  pleasant and cooperative in NAD Head:  Normocephalic and atraumatic. Neck:  Supple; no masses or thyromegaly. Lungs:  Clear throughout to  auscultation, normal respiratory effort.    Heart:  +S1, +S2, Regular rate and rhythm, No edema. Abdomen:  Soft, nontender and nondistended. Normal bowel sounds, without guarding, and without rebound.   Neurologic:  Alert and  oriented x4;  grossly normal neurologically.  Impression/Plan: Roberta Curtis is here for an colonoscopy  to be performed for constipation.   Risks, benefits, limitations, and alternatives regarding  colonoscopy have been reviewed with the patient.  Questions have been answered.  All parties agreeable.   Roberta Bellows, MD  10/06/2022, 9:52 AM

## 2022-10-06 NOTE — Anesthesia Preprocedure Evaluation (Signed)
Anesthesia Evaluation  Patient identified by MRN, date of birth, ID band Patient awake    Reviewed: Allergy & Precautions, NPO status , Patient's Chart, lab work & pertinent test results  History of Anesthesia Complications Negative for: history of anesthetic complications  Airway Mallampati: II  TM Distance: >3 FB Neck ROM: Full    Dental  (+) Missing, Partial Lower   Pulmonary neg sleep apnea, neg COPD, Patient abstained from smoking.Not current smoker, former smoker   Pulmonary exam normal breath sounds clear to auscultation       Cardiovascular Exercise Tolerance: Good METS(-) hypertension(-) CAD and (-) Past MI negative cardio ROS (-) dysrhythmias  Rhythm:Regular Rate:Normal - Systolic murmurs    Neuro/Psych  Headaches Patient with asymmetric smile, says it is from a fall she sustained as a child. Denies stroke hx.  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[place]) No date: GERD (gastroesophageal reflux disease) No date: Heart murmur No date: Osteopenia No date: Thrush  Reproductive/Obstetrics                              Anesthesia Physical Anesthesia Plan  ASA: 2  Anesthesia Plan: General   Post-op Pain Management: Minimal or no pain anticipated   Induction: Intravenous  PONV Risk Score and Plan: 3 and Propofol infusion, TIVA and Ondansetron  Airway Management Planned: Nasal Cannula  Additional Equipment: None  Intra-op Plan:   Post-operative Plan:   Informed Consent: I have reviewed the patients History and Physical, chart, labs and discussed the procedure including the risks, benefits and alternatives for the proposed anesthesia with the  patient or authorized representative who has indicated his/her understanding and acceptance.     Dental advisory given  Plan Discussed with: CRNA and Surgeon  Anesthesia Plan Comments: (Discussed risks of anesthesia with patient, including possibility of difficulty with spontaneous ventilation under anesthesia necessitating airway intervention, PONV, and rare risks such as cardiac or respiratory or neurological events, and allergic reactions. Discussed the role of CRNA in patient's perioperative care. Patient understands.)         Anesthesia Quick Evaluation

## 2022-10-07 ENCOUNTER — Encounter: Payer: Self-pay | Admitting: Gastroenterology

## 2022-10-07 LAB — SURGICAL PATHOLOGY

## 2022-10-07 NOTE — Progress Notes (Signed)
noted 

## 2022-10-08 ENCOUNTER — Encounter (HOSPITAL_COMMUNITY): Payer: Self-pay | Admitting: Oncology

## 2022-10-08 ENCOUNTER — Inpatient Hospital Stay: Payer: Medicare HMO | Attending: Oncology

## 2022-10-08 DIAGNOSIS — Z148 Genetic carrier of other disease: Secondary | ICD-10-CM | POA: Insufficient documentation

## 2022-10-08 DIAGNOSIS — C9201 Acute myeloblastic leukemia, in remission: Secondary | ICD-10-CM | POA: Insufficient documentation

## 2022-10-08 LAB — COMPREHENSIVE METABOLIC PANEL
ALT: 13 U/L (ref 0–44)
AST: 22 U/L (ref 15–41)
Albumin: 3.9 g/dL (ref 3.5–5.0)
Alkaline Phosphatase: 70 U/L (ref 38–126)
Anion gap: 8 (ref 5–15)
BUN: 12 mg/dL (ref 8–23)
CO2: 24 mmol/L (ref 22–32)
Calcium: 8.6 mg/dL — ABNORMAL LOW (ref 8.9–10.3)
Chloride: 109 mmol/L (ref 98–111)
Creatinine, Ser: 0.74 mg/dL (ref 0.44–1.00)
GFR, Estimated: >60 mL/min (ref >60)
Glucose, Bld: 125 mg/dL — ABNORMAL HIGH (ref 70–99)
Potassium: 3.8 mmol/L (ref 3.5–5.1)
Sodium: 141 mmol/L (ref 135–145)
Total Bilirubin: 0.2 mg/dL — ABNORMAL LOW (ref 0.3–1.2)
Total Protein: 6.8 g/dL (ref 6.5–8.1)

## 2022-10-08 LAB — IRON AND TIBC
Iron: 75 ug/dL (ref 28–170)
Saturation Ratios: 22 % (ref 10.4–31.8)
TIBC: 340 ug/dL (ref 250–450)
UIBC: 265 ug/dL

## 2022-10-08 LAB — CBC WITH DIFFERENTIAL/PLATELET
Abs Immature Granulocytes: 0.03 10*3/uL (ref 0.00–0.07)
Basophils Absolute: 0 10*3/uL (ref 0.0–0.1)
Basophils Relative: 1 %
Eosinophils Absolute: 0.2 10*3/uL (ref 0.0–0.5)
Eosinophils Relative: 3 %
HCT: 33.4 % — ABNORMAL LOW (ref 36.0–46.0)
Hemoglobin: 10.8 g/dL — ABNORMAL LOW (ref 12.0–15.0)
Immature Granulocytes: 0 %
Lymphocytes Relative: 42 %
Lymphs Abs: 3.6 10*3/uL (ref 0.7–4.0)
MCH: 23.8 pg — ABNORMAL LOW (ref 26.0–34.0)
MCHC: 32.3 g/dL (ref 30.0–36.0)
MCV: 73.6 fL — ABNORMAL LOW (ref 80.0–100.0)
Monocytes Absolute: 0.6 10*3/uL (ref 0.1–1.0)
Monocytes Relative: 7 %
Neutro Abs: 4 10*3/uL (ref 1.7–7.7)
Neutrophils Relative %: 47 %
Platelets: 228 10*3/uL (ref 150–400)
RBC: 4.54 MIL/uL (ref 3.87–5.11)
RDW: 14.9 % (ref 11.5–15.5)
WBC: 8.4 10*3/uL (ref 4.0–10.5)
nRBC: 0 % (ref 0.0–0.2)

## 2022-10-08 LAB — FERRITIN: Ferritin: 41 ng/mL (ref 11–307)

## 2022-10-08 LAB — LACTATE DEHYDROGENASE: LDH: 149 U/L (ref 98–192)

## 2022-10-12 ENCOUNTER — Inpatient Hospital Stay (HOSPITAL_BASED_OUTPATIENT_CLINIC_OR_DEPARTMENT_OTHER): Payer: Medicare HMO | Admitting: Oncology

## 2022-10-12 ENCOUNTER — Encounter: Payer: Self-pay | Admitting: Oncology

## 2022-10-12 VITALS — BP 145/66 | HR 71 | Temp 98.1°F | Wt 138.9 lb

## 2022-10-12 DIAGNOSIS — Z148 Genetic carrier of other disease: Secondary | ICD-10-CM | POA: Diagnosis not present

## 2022-10-12 DIAGNOSIS — C9201 Acute myeloblastic leukemia, in remission: Secondary | ICD-10-CM

## 2022-10-12 NOTE — Assessment & Plan Note (Addendum)
#  History of AML in 2007, status post bone marrow transplant. Labs reviewed and discussed with patient.  Negative peripheral flow cytometry. Jan 2024 Bone marrow biopsy results were reviewed and discussed with patient. No increase blast.  Cytogenetics were normal. 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

## 2022-10-12 NOTE — Progress Notes (Signed)
Hematology/Oncology Progress note [phone] Fax:([phone]) [phone]     CHIEF COMPLAINTS/REASON FOR VISIT:   history of AML status post bone marrow transplant, hemochromatosis carrier  ASSESSMENT & PLAN:   AML (acute myeloid leukemia) in remission ([place]) #History of AML in 2007, status post bone marrow transplant. Labs reviewed and discussed with patient.  Negative peripheral flow cytometry. Jan 2024 Bone marrow biopsy results were reviewed and discussed with patient. No increase blast.  Cytogenetics were normal. 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   Hypocalcemia Recommend calcium and Vitamin D supplementation.    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  Continue monitor.   Orders Placed This Encounter  Procedures   CBC with Differential/Platelet    Standing Status:   Future    Standing Expiration Date:   10/13/2023   Comprehensive metabolic panel    Standing Status:   Future    Standing Expiration Date:   10/12/2023   Lactate dehydrogenase    Standing Status:   Future    Standing Expiration Date:   10/13/2023   Vitamin B12    Standing Status:   Future    Standing Expiration Date:   10/13/2023   Folate    Standing Status:   Future    Standing Expiration Date:   10/13/2023    Follow up in 6 months. All questions were answered. The patient knows to call the clinic with any problems, questions or concerns.  Earlie Server, MD, PhD [facility] Hematology Oncology 10/12/2022     HISTORY OF PRESENTING ILLNESS:   Roberta Curtis is a  82 y.o.  female with PMH listed below was seen in consultation at the request of  Waunita Schooner, MD to establish care for  history of AML status post bone marrow transplant, hemochromatosis carrier. Patient moved from [place] to [place] in February 2021. She brought some of her oncology  records. 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  Patient received induction daunorubincin and cytarabin.  04/18/2006- axilla mass exicision - microabscess with gram positive cocci.   04/19/2006, bone marrow biopsy showed 50% cellularity, chemotherapeutic effect, day 14.  Persistent leukemia given persistent  CD117 cells, CD34 positive cells are slightly increased. 04/28/2006, bone marrow biopsy showed 50 to 75% cellularity with immature infiltrates, mild maturation present, markedly decreased erythroid elements with dyserythropoiesis, marked megakaryocytosis with dysplastic morphology, increased iron stores consistent with acute myeloid leukemia with multilineage dysplastic 05/28/2006 per note,  bone marrow which showed AML in remission 08/18/2006-admitted for bone marrow transplant, received busulfan and etoposide.  Received washed autologous peripheral blood stem cells on 08/27/2006.  Engraftment was noted on day 13-09/09/2006  Previously followed up with oncologist and has been having bone marrow biopsy surveillance annually and she has stayed in remission. .  Her last Bone marrow biopsy was done in 2019.  05/26/2018 Bone marrow biopsy showed normocellular marrow with adequate trilineage hematopoiesis, no morphology evidence of increase blasts, consistent with AML in remission.  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  Detail of mutation is  unknown. She reports that she has had phlebotomy in the past. Reports she has had right axillary lymph node biopsy and was told by her doctors not to have blood drawn on the right upper extremity due to risk of lymphedema.  Patient tells me that her husband passed away from [facility] many years ago.  Jan 2023 Bone marrow biopsy results were reviewed and discussed with patient. No leukemia, lymphoma or high-grade dysplasia.  Cytogenetics were normal.   INTERVAL HISTORY Roberta Curtis is a 82 y.o. female who has above history reviewed by me today presents for follow up visit for history of AML Patient reports feeling well.  Denies any constitutional symptoms. No new complaints.  Jan 2024 Bone marrow biopsy showed 20% marrow cellularity, no increased blasts.  Cytogenetics were normal.   Review of Systems  Constitutional:  Negative for appetite change, chills, fatigue and fever.  HENT:   Negative for hearing loss and voice change.   Eyes:  Negative for eye problems.  Respiratory:  Negative for chest tightness and cough.   Cardiovascular:  Negative for chest pain.  Gastrointestinal:  Negative for abdominal distention, abdominal pain and blood in stool.  Endocrine: Negative for hot flashes.  Genitourinary:  Negative for difficulty urinating and frequency.   Musculoskeletal:  Negative for arthralgias.  Skin:  Negative for itching and rash.  Neurological:  Negative for extremity weakness.       Left side weakness  Hematological:  Negative for adenopathy.  Psychiatric/Behavioral:  Negative for confusion.     MEDICAL HISTORY:  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HCC)    GERD (gastroesophageal reflux disease)    Heart murmur    Osteopenia    Thrush     SURGICAL HISTORY: Past Surgical History:  Procedure Laterality Date   BONE MARROW TRANSPLANT     BREAST LUMPECTOMY Left    fatty tumor,1970's   CATARACT EXTRACTION     CESAREAN SECTION      COLONOSCOPY WITH PROPOFOL N/A 10/06/2022   Procedure: COLONOSCOPY WITH PROPOFOL;  Surgeon: Jonathon Bellows, MD;  Location: [facility] ENDOSCOPY;  Service: Gastroenterology;  Laterality: N/A;   EYE SURGERY     HYSTEROTOMY     LYMPH GLAND EXCISION     MENISCUS REPAIR Right    NECK SURGERY     SALPINGECTOMY     WISDOM TOOTH EXTRACTION      SOCIAL HISTORY: Social History   Socioeconomic History   Marital status: Widowed    Spouse name: Alvis Lemmings   Number of children: 3   Years of education: high school   Highest education level: Not on file  Occupational History   Occupation: retired  Tobacco Use   Smoking status: Former    Packs/day: 1.00    Years: 6.00    Total pack years: 6.00    Types: Cigarettes    Quit date: 02/20/1970    Years since quitting: 52.6   Smokeless tobacco: Never  Vaping Use   Vaping Use: Never used  Substance and Sexual Activity   Alcohol use: Not Currently   Drug use: Never   Sexual activity: Not Currently    Partners: Male    Birth control/protection: Post-menopausal  Other Topics Concern   Not on file  Social History Narrative      Right handed   Lives in a condo with domestic partner   From [place]  02/12/20   From: [place], moved to be near [place]: with Alvis Lemmings - husband   Health Care Proxy: Teresita Madura (daughter)   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      Family: Product/process development scientist, lives in [place]), [place] ([place], [place]), Juanda Crumble ([place])      Enjoys: meet new people, socialable      Exercise: not currently - cleaning   Diet: picky eater - eats veggies - but her husband doesn't like these things      Safety   Seat belts: Yes    Guns: Yes  and secure   Safe in relationships: Yes    Social Determinants of Health   Financial Resource Strain: Low Risk  (02/18/2021)   Overall Financial Resource Strain (CARDIA)    Difficulty of Paying Living Expenses: Not hard at all  Food Insecurity: No  Food Insecurity (02/18/2021)   Hunger Vital Sign    Worried About Running Out of Food in the Last Year: Never true    [place] in the Last Year: Never true  Transportation Needs: No Transportation Needs (02/18/2021)   PRAPARE - Hydrologist (Medical): No    Lack of Transportation (Non-Medical): No  Physical Activity: Inactive (02/18/2021)   Exercise Vital Sign    Days of Exercise per Week: 0 days    Minutes of Exercise per Session: 0 min  Stress: No Stress Concern Present (02/18/2021)   [place]    Feeling of Stress : Not at all  Social Connections: Not on file  Intimate Partner Violence: Not At Risk (02/18/2021)   Humiliation, Afraid, Rape, and Kick questionnaire    Fear of Current or Ex-Partner: No    Emotionally Abused: No    Physically Abused: No    Sexually Abused: No    FAMILY HISTORY: Family History  Problem Relation Age of Onset   Retinitis pigmentosa Mother    Stroke Mother    Liver cancer Father    Heart disease Father    Heart disease Brother    Blindness Brother     ALLERGIES:  is allergic to jadenu [deferasirox], ketotifen fumarate, levofloxacin, ondansetron hcl, codeine, oxycodone-acetaminophen, and penicillins.  MEDICATIONS:  Current Outpatient Medications  Medication Sig Dispense Refill   albuterol (VENTOLIN HFA) 108 (90 Base) MCG/ACT inhaler Inhale 1-2 puffs into the lungs as needed for wheezing or shortness of breath. 6.7 g 1   aspirin EC 81 MG tablet Take 81 mg by mouth daily. Swallow whole.     Biotin 1 MG CAPS Take 1,000 mcg by mouth daily.      calcium carbonate (OS-CAL - DOSED IN MG OF ELEMENTAL CALCIUM) 1250 (500 Ca) MG tablet Take 1 tablet by mouth daily.     Cholecalciferol (VITAMIN D3) 50 MCG (2000 UT) CAPS Take 4,000 Units by mouth daily.     cyanocobalamin (VITAMIN B12) 500 MCG tablet Take 500 mcg by mouth daily.     cyclobenzaprine (FLEXERIL) 5  MG tablet Take 5 mg by mouth at bedtime.     ibandronate (BONIVA) 150 MG tablet TAKE 1 TABLET (150 MG TOTAL) BY MOUTH EVERY 30 (THIRTY) DAYS. 3 tablet 1   magnesium oxide (MAG-OX) 400 (240 Mg) MG tablet Take 400 mg by mouth daily.     meclizine (ANTIVERT) 12.5 MG tablet Take 1 tablet (12.5 mg total) by mouth 3 (three) times daily as needed. 30 tablet  0   esomeprazole (NEXIUM) 20 MG capsule Take 1 capsule (20 mg total) by mouth daily at 12 noon. (Patient not taking: Reported on 10/06/2022) 90 capsule 1   No current facility-administered medications for this visit.     PHYSICAL EXAMINATION: ECOG PERFORMANCE STATUS: 0 - Asymptomatic Vitals:   10/12/22 1313  BP: (!) 145/66  Pulse: 71  Temp: 98.1 F (36.7 C)  SpO2: 100%   Filed Weights   10/12/22 1313  Weight: 138 lb 14.4 oz (63 kg)    Physical Exam Constitutional:      General: She is not in acute distress. HENT:     Head: Normocephalic and atraumatic.  Eyes:     General: No scleral icterus. Cardiovascular:     Rate and Rhythm: Normal rate and regular rhythm.  Pulmonary:     Effort: Pulmonary effort is normal. No respiratory distress.     Breath sounds: No wheezing.  Abdominal:     General: There is no distension.     Palpations: Abdomen is soft.  Musculoskeletal:        General: No deformity. Normal range of motion.     Cervical back: Normal range of motion and neck supple.  Skin:    General: Skin is warm and dry.     Findings: No rash.  Neurological:     Mental Status: She is alert and oriented to person, place, and time. Mental status is at baseline.     Cranial Nerves: No cranial nerve deficit.     Coordination: Coordination normal.  Psychiatric:        Mood and Affect: Mood normal.     LABORATORY DATA:  I have reviewed the data as listed     Latest Ref Rng & Units 10/08/2022   12:57 PM 10/01/2022    7:37 AM 08/06/2022    9:52 AM  CBC  WBC 4.0 - 10.5 K/uL 8.4  6.4  6.4   Hemoglobin 12.0 - 15.0 g/dL 10.8   11.7  11.9   Hematocrit 36.0 - 46.0 % 33.4  35.6  37.7   Platelets 150 - 400 K/uL 228  267  256.0       Latest Ref Rng & Units 10/08/2022   12:57 PM 04/07/2022   12:43 PM 02/24/2022   12:37 PM  CMP  Glucose 70 - 99 mg/dL 125  104  104   BUN 8 - 23 mg/dL '12  18  13   '$ Creatinine 0.44 - 1.00 mg/dL 0.74  0.75  0.86   Sodium 135 - 145 mmol/L 141  139  140   Potassium 3.5 - 5.1 mmol/L 3.8  3.9  4.3   Chloride 98 - 111 mmol/L 109  105  107   CO2 22 - 32 mmol/L '24  28  27   '$ Calcium 8.9 - 10.3 mg/dL 8.6  9.7  9.5   Total Protein 6.5 - 8.1 g/dL 6.8  7.0    Total Bilirubin 0.3 - 1.2 mg/dL 0.2  0.6    Alkaline Phos 38 - 126 U/L 70  58    AST 15 - 41 U/L 22  20    ALT 0 - 44 U/L 13  15      Iron/TIBC/Ferritin/ %Sat    Component Value Date/Time   IRON 75 10/08/2022 1257   IRON 108 08/06/2019 0000   TIBC 340 10/08/2022 1257   TIBC 324 08/06/2019 0000   FERRITIN 41 10/08/2022 1257   FERRITIN 74 08/06/2019 0000  IRONPCTSAT 22 10/08/2022 1257   IRONPCTSAT 33 08/06/2019 0000      RADIOGRAPHIC STUDIES: I have personally reviewed the radiological images as listed and agreed with the findings in the report. CT BONE MARROW BIOPSY & ASPIRATION  Result Date: 10/01/2022 INDICATION: Acute myeloid leukemia EXAM: CT GUIDED BONE MARROW ASPIRATES AND BIOPSY MEDICATIONS: None. ANESTHESIA/SEDATION: Fentanyl 100 mcg IV; Versed 2 mg IV Moderate Sedation Time:  20 minutes The patient was continuously monitored during the procedure by the interventional radiology nurse under my direct supervision. COMPLICATIONS: None immediate. PROCEDURE: The procedure was explained to the patient. The risks and benefits of the procedure were discussed and the patient's questions were addressed. Informed consent was obtained from the patient. The patient was placed prone on CT table. Images of the pelvis were obtained. The right side of back was prepped and draped in sterile fashion. The skin and right posterior ilium were  anesthetized with 1% lidocaine. 11 gauge bone needle was directed into the right ilium with CT guidance. Two aspirates and 2 core biopsy were obtained. Bandage placed over the puncture site. IMPRESSION: CT guided bone marrow aspiration and core biopsy. Electronically Signed   By: Miachel Roux M.D.   On: 10/01/2022 16:30

## 2022-10-12 NOTE — Assessment & Plan Note (Signed)
Recommend calcium and Vitamin D supplementation.

## 2022-10-12 NOTE — Assessment & Plan Note (Signed)
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  Continue monitor.

## 2022-10-22 ENCOUNTER — Other Ambulatory Visit: Payer: Self-pay

## 2022-10-22 DIAGNOSIS — K219 Gastro-esophageal reflux disease without esophagitis: Secondary | ICD-10-CM

## 2022-10-22 NOTE — Telephone Encounter (Signed)
Received fax for refill on Nexium. Do not see where you have given in the past and was marked as not taking on 10/06/22.  Ok to refill as pended?

## 2022-10-23 MED ORDER — ESOMEPRAZOLE MAGNESIUM 20 MG PO CPDR: 20.0000 mg | DELAYED_RELEASE_CAPSULE | Freq: Every day | ORAL | 1 refills | Status: DC

## 2022-10-24 ENCOUNTER — Ambulatory Visit
Admission: RE | Admit: 2022-10-24 | Discharge: 2022-10-24 | Disposition: A | Discharge status: Home / Self Care | Payer: Medicare HMO | Source: Ambulatory Visit | Attending: Urgent Care | Admitting: Urgent Care

## 2022-10-24 VITALS — BP 124/76 | HR 78 | Temp 98.1°F | Resp 16

## 2022-10-24 DIAGNOSIS — N3001 Acute cystitis with hematuria: Secondary | ICD-10-CM

## 2022-10-24 LAB — POCT URINALYSIS DIP (MANUAL ENTRY)
Bilirubin, UA: NEGATIVE
Glucose, UA: NEGATIVE mg/dL
Nitrite, UA: NEGATIVE
Protein Ur, POC: 100 mg/dL — AB
Spec Grav, UA: 1.025 (ref 1.010–1.025)
Urobilinogen, UA: 0.2 E.U./dL
pH, UA: 5.5 (ref 5.0–8.0)

## 2022-10-24 MED ORDER — NITROFURANTOIN MONOHYD MACRO 100 MG PO CAPS: 100.0000 mg | ORAL_CAPSULE | Freq: Two times a day (BID) | ORAL | 0 refills | Status: DC

## 2022-10-24 NOTE — ED Provider Notes (Addendum)
Roberta Curtis    CSN: [id number] Arrival date & time: 10/24/22  0848      History   Chief Complaint Chief Complaint  Patient presents with   Urinary Frequency    HPI Roberta Curtis is a 82 y.o. female.    Urinary Frequency    Presents urgent care with complaint of urinary frequency and urgency x 2 days.  Denies fever, abdominal pain, flank pain.  She endorses recent treatment for UTI "about 1 week ago".  She does not recall what antibiotic she used but states it was sent to [facility] near [place].  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[place])    GERD (gastroesophageal reflux disease)    Heart murmur    Osteopenia    Thrush     Patient Active Problem List   Diagnosis Date Noted   Hypocalcemia 10/12/2022   Adenomatous polyp of colon 10/06/2022   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   Acute cystitis with hematuria 09/25/2022   Other emphysema ([place]) 08/06/2022   Low serum vitamin B12 08/06/2022   Left-sided sensory deficit present    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   B12 deficiency 02/12/2022   Headache disorder 02/12/2022   Change in bowel habits 02/12/2022   Neurological deficit present    Questionable TIA (transient ischemic attack) 02/08/2022   Left-sided weakness 02/08/2022   Hypochromic-microcytic anemia 02/08/2022   Dizziness 01/08/2022   Vertigo 01/08/2022   Aortic 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[place]) 12/22/2021   Neuropathy 11/05/2021   Urge incontinence 09/05/2020   Chronic nausea 08/13/2020   Chronic bilateral low back pain without sciatica 0000000   Mild diastolic dysfunction [id number]   AML (acute myeloid leukemia) in remission ([place]) 02/12/2020   Osteopenia 02/12/2020   GERD (gastroesophageal reflux disease) 02/12/2020   Mitral valve regurgitation 02/12/2020   Prediabetes 02/12/2020   CKD (chronic kidney disease) stage 2, 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      Home Medications    Prior to Admission medications   Medication Sig Start Date End Date Taking? Authorizing Provider  albuterol (VENTOLIN HFA) 108 (90 Base) MCG/ACT inhaler Inhale 1-2 puffs into the lungs as needed for wheezing or shortness of breath. 05/25/22   Waunita Schooner, MD  aspirin EC 81 MG tablet Take 81 mg by mouth daily. Swallow whole.    [provider]  Biotin 1 MG CAPS Take 1,000 mcg by mouth daily.     [provider]  calcium carbonate (OS-CAL - DOSED IN MG OF ELEMENTAL CALCIUM) 1250 (500 Ca) MG tablet Take 1 tablet by mouth daily.    [provider]  Cholecalciferol (VITAMIN D3) 50 MCG (2000 UT) CAPS Take 4,000 Units by mouth daily.    [provider]  cyanocobalamin (VITAMIN B12) 500 MCG tablet Take 500  mcg by mouth daily.    [provider]  cyclobenzaprine (FLEXERIL) 5 MG tablet Take 5 mg by mouth at bedtime. 08/11/22   [provider]  esomeprazole (NEXIUM) 20 MG capsule Take 1 capsule (20 mg total) by mouth daily at 12 noon. 10/23/22   [name], FNP  ibandronate (BONIVA) 150 MG tablet TAKE 1 TABLET (150 MG TOTAL) BY MOUTH EVERY 30 (THIRTY) DAYS. 04/21/22   Waunita Schooner, MD  magnesium oxide (MAG-OX) 400 (240 Mg) MG tablet Take 400 mg by mouth daily.    [provider]  meclizine (ANTIVERT) 12.5 MG tablet Take 1 tablet (12.5 mg total) by mouth 3 (three) times daily as needed. 08/11/22   [name], FNP     Family History Family History  Problem Relation Age of Onset   Retinitis pigmentosa Mother    Stroke Mother    Liver cancer Father    Heart disease Father    Heart disease Brother    Blindness Brother     Social History Social History   Tobacco Use   Smoking status: Former    Packs/day: 1.00    Years: 6.00    Total pack years: 6.00    Types: Cigarettes    Quit date: 02/20/1970    Years since quitting: 52.7   Smokeless tobacco: Never  Vaping Use   Vaping Use: Never used  Substance Use Topics   Alcohol use: Not Currently   Drug use: Never     Allergies   Jadenu [deferasirox], Ketotifen fumarate, Levofloxacin, Ondansetron hcl, Codeine, Oxycodone-acetaminophen, and Penicillins   Review of Systems Review of Systems  Genitourinary:  Positive for frequency.     Physical Exam Triage Vital Signs ED Triage Vitals [10/24/22 0918]  Enc Vitals Group     BP 124/76     Pulse Rate 78     Resp 16     Temp 98.1 F (36.7 C)     Temp Source Temporal     SpO2 98 %     Weight      Height      Head Circumference      Peak Flow      Pain Score      Pain Loc      Pain Edu?      Excl. in [place]?    No data found.  Updated Vital Signs BP 124/76 (BP Location: Left Arm)   Pulse 78   Temp 98.1 F (36.7 C) (Temporal)   Resp 16   SpO2 98%   Visual Acuity Right Eye Distance:   Left Eye Distance:   Bilateral Distance:    Right Eye Near:   Left Eye Near:    Bilateral Near:     Physical Exam Vitals reviewed.  Constitutional:      Appearance: Normal appearance.  Skin:    General: Skin is warm and dry.  Neurological:     General: No focal deficit present.     Mental Status: She is alert and oriented to person, place, and time.  Psychiatric:        Mood and Affect: Mood normal.        Behavior: Behavior normal.      UC Treatments / Results  Labs (all labs ordered are listed, but only abnormal results are displayed) Labs Reviewed  POCT URINALYSIS DIP  (MANUAL ENTRY)    EKG   Radiology No results found.  Procedures Procedures (including critical care time)  Medications Ordered in UC Medications -  No data to display  Initial Impression / Assessment and Plan / UC Course  I have reviewed the triage vital signs and the nursing notes.  Pertinent labs & imaging results that were available during my care of the patient were reviewed by me and considered in my medical decision making (see chart for details).   Patient is afebrile here without recent antipyretics. Satting well on room air. Overall is well appearing, well hydrated, without respiratory distress.   UA indicates signs of UTI with large leuks and moderate blood present. Will treat with antibiotic therapy and send for confirmatory culture given her age and risk for poor outcome. Determined that patient was treated with Bactrim on 09/25/2022. Will prescribe macrobid as her GFR is normal.   Final Clinical Impressions(s) / UC Diagnoses   Final diagnoses:  None   Discharge Instructions   None    ED Prescriptions   None    PDMP not reviewed this encounter.   Rose Phi, [place] 10/24/22 [place], [place], [place] 10/24/22 [phone]

## 2022-10-24 NOTE — Discharge Instructions (Signed)
Follow up here or with your primary care provider if your symptoms are worsening or not improving with treatment.     

## 2022-10-24 NOTE — ED Triage Notes (Signed)
Patient presents to [facility] for urinary freq and urgency since Thursday night. Not taking any OTC meds for relief.

## 2022-10-26 ENCOUNTER — Telehealth: Payer: Self-pay

## 2022-10-26 NOTE — Telephone Encounter (Signed)
Noted  

## 2022-10-26 NOTE — Telephone Encounter (Signed)
Per chart review tab pt was seen at [facility] on 10/24/22 and pt was given abx. Sending note to Red Christians FNP.   [place] Night - Client TELEPHONE ADVICE RECORD [facility] Patient Name: [facility] AN Maude Leriche Gender: Female DOB: March 30, 1941 Age: 82 Y 15 M 17 D Return Phone Number: [phone] (Primary) Address: City/ State/ Zip: [place]  [address] Client [facility] Night - Client Client Site [place] Provider AA - PHYSICIAN, NOT LISTED- MD Contact Type Call Who Is Calling Patient / Member / Family / Caregiver Call Type Triage / Clinical Relationship To Patient Self Return Phone Number [phone] (Primary) Chief Complaint NUMBNESS/TINGLING- sudden on one side of the body or face Reason for Call Symptomatic / Request for [place] thinks she has a UTI and she got a UTI and there's a tingling in her hand. It's only in one hand. She is trickling when she goes to the bathroom. [facility] at [facility] [phone] Translation No Nurse Assessment Nurse: Rosana Hoes, RN, Luvenia Starch Date/Time Eilene Ghazi Time): 10/23/2022 6:31:56 PM Confirm and document reason for call. If symptomatic, describe symptoms. ---Caller states she believes she has a UTI, urinary frequency, tingling feeling to hand only when she has to urinate, had this same symptom the last UTI she had, colonoscopy 2 weeks ago and was prescribed Miralax, caller concerned that increased BMs may have caused UTI. Does the patient have any new or worsening symptoms? ---Yes Will a triage be completed? ---Yes Related visit to physician within the last 2 weeks? ---No Does the PT have any chronic conditions? (i.e. diabetes, asthma, this includes High risk factors for pregnancy, etc.) ---No Is this a behavioral health or substance abuse call? ---No Guidelines Guideline  Title Affirmed Question Affirmed Notes Nurse Date/Time Eilene Ghazi Time) Urinary Symptoms Urinating more frequently than usual (i.e., frequency) Rosana Hoes, RN, Luvenia Starch 10/23/2022 6:35:34 PM PLEASE NOTE: All timestamps contained within this report are represented as [place] Standard Time. CONFIDENTIALTY NOTICE: This fax transmission is intended only for the addressee. It contains information that is legally privileged, confidential or otherwise protected from use or disclosure. If you are not the intended recipient, you are strictly prohibited from reviewing, disclosing, copying using or disseminating any of this information or taking any action in reliance on or regarding this information. If you have received this fax in error, please notify us immediately by telephone so that we can arrange for its return to [place]. Phone: [phone], Toll-Free: [phone], Fax: [phone] Page: 2 of 2 Call Id: [id number] Roland. Time Eilene Ghazi Time) Disposition Final User 10/23/2022 6:29:27 PM Send to Urgent Queue Abigail Butts 10/23/2022 6:40:36 PM See PCP within 24 Hours Yes Rosana Hoes, RN, Luvenia Starch Final Disposition 10/23/2022 6:40:36 PM See PCP within 24 Hours Yes Rosana Hoes, RN, Luvenia Starch Caller Disagree/Comply Comply Caller Understands Yes PreDisposition Did not know what to do Care Advice Given Per Guideline SEE PCP WITHIN 24 HOURS: DRINK PLENTY OF LIQUIDS: * Drink plenty of liquids. It is important to stay well-hydrated. * A healthy adult should drink 8 cups (240 ml) or more of liquid each day. CALL BACK IF: * Fever occurs * Unable to urinate and bladder feels full * You become worse CARE ADVICE given per Urinary Symptoms (Adult) guideline. * How can you tell if you are drinking enough liquids? The goal is to keep the urine clear or light-yellow in color. If your urine is bright  yellow or dark yellow, you are probably not drinking enough liquids. * IF OFFICE WILL BE CLOSED: You need to be seen within the next 24 hours.  A clinic or an urgent care center is often a good source of care if your doctor's office is closed or you can't get an appointment. Referrals GO TO FACILITY OTHER - SPECIF

## 2022-10-27 LAB — URINE CULTURE: Culture: >=100000 — AB

## 2022-10-29 ENCOUNTER — Encounter: Payer: Self-pay | Admitting: Gastroenterology

## 2022-10-29 ENCOUNTER — Ambulatory Visit (INDEPENDENT_AMBULATORY_CARE_PROVIDER_SITE_OTHER): Payer: Medicare HMO | Admitting: Gastroenterology

## 2022-10-29 VITALS — BP 123/58 | HR 71 | Temp 97.9°F | Ht 63.0 in | Wt 137.1 lb

## 2022-10-29 DIAGNOSIS — K5909 Other constipation: Secondary | ICD-10-CM | POA: Diagnosis not present

## 2022-10-29 NOTE — Progress Notes (Addendum)
Jonathon Bellows MD, MRCP(U.K) [address]  [place]  [place], [place] [address]  Main: [phone]  Fax: [phone]  Virtual Visit via Video Note  I connected with patient on 11/03/22 at  2:00 PM EST by video and verified that I am speaking with the correct person using two identifiers.   I discussed the limitations, risks, security and privacy concerns of performing an evaluation and management service by video  and the availability of in person appointments. I also discussed with the patient that there may be a patient responsible charge related to this service. The patient expressed understanding and agreed to proceed.  Location of Patient: Home Location of Provider: office  Persons involved: Patient and provider only Primary Care Physician: [name], C[place]  Primary Gastroenterologist:  Dr. Jonathon Bellows   Chief Complaint  Patient presents with   change in bowel habits    HPI: Roberta Curtis is a 82 y.o. female  Summary of history :  She was referred to me and seen back in December 2023 for constipation.  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  She was doing well and over the last few months has developed significant constipation.  When she has to have a bowel movement has to strain a lot denies any change in the shape of the stool denies any blood in the stool denies any unintentional weight loss .   Has tried over-the-counter stool softeners.  Diet is not rich in fiber.   12/10/2021 CT abdomen showed no abdominal abnormalities 08/06/2022 hemoglobin 11.9 g with an MCV of 75 low since past 8 months.  Ferritin in 04/26/2022 was 36 iron studies were normal.  B12 normal.  CMP normal.  Interval history 08/25/2022-10/29/2022  08/25/2022: TSH normal  10/06/2022: Colonoscopy 2 sessile polyps in the ascending colon 4 to 5 mm in size resected diverticulosis of the left colon otherwise normal.  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  She  is doing well has responded well to MiraLAX it appears that 1 capful of MiraLAX daily was causing her to have diarrhea hence has started taking 1 cap every other day which causes to shift between constipation and normal bowel movements no other complaints presently.  Current Outpatient Medications  Medication Sig Dispense Refill   albuterol (VENTOLIN HFA) 108 (90 Base) MCG/ACT inhaler Inhale 1-2 puffs into the lungs as needed for wheezing or shortness of breath. 6.7 g 1   aspirin EC 81 MG tablet Take 81 mg by mouth daily. Swallow whole.     Biotin 1 MG CAPS Take 1,000 mcg by mouth daily.      calcium carbonate (OS-CAL - DOSED IN MG OF ELEMENTAL CALCIUM) 1250 (500 Ca) MG tablet Take 1 tablet by mouth daily.     Cholecalciferol (VITAMIN D3) 50 MCG (2000 UT) CAPS Take 4,000 Units by mouth daily.     cyanocobalamin (VITAMIN B12) 500 MCG tablet Take 500 mcg by mouth daily.     cyclobenzaprine (FLEXERIL) 5 MG tablet Take 5 mg by mouth at bedtime.     esomeprazole (NEXIUM) 20 MG capsule Take 1 capsule (20 mg total) by mouth daily at 12 noon. 90 capsule 1   ibandronate (BONIVA) 150 MG tablet TAKE 1 TABLET (150 MG TOTAL) BY MOUTH EVERY 30 (THIRTY) DAYS. 3 tablet 1   magnesium oxide (MAG-OX) 400 (240 Mg) MG tablet Take 400 mg by mouth daily.     meclizine (ANTIVERT) 12.5 MG tablet  Take 1 tablet (12.5 mg total) by mouth 3 (three) times daily as needed. 30 tablet 0   nitrofurantoin, macrocrystal-monohydrate, (MACROBID) 100 MG capsule Take 1 capsule (100 mg total) by mouth 2 (two) times daily. 10 capsule 0   No current facility-administered medications for this visit.    Allergies as of 10/29/2022 - Review Complete 10/29/2022  Allergen Reaction Noted   Jadenu [deferasirox] Other (See Comments) 02/12/2020   Ketotifen fumarate  02/16/2022   Levofloxacin Other (See Comments) 01/03/2020   Ondansetron hcl Other (See Comments) 01/03/2020   Codeine Palpitations 01/03/2020   Oxycodone-acetaminophen  Palpitations and Other (See Comments) 01/03/2020   Penicillins Rash 01/03/2020    ROS:  General: Negative for anorexia, weight loss, fever, chills, fatigue, weakness. ENT: Negative for hoarseness, difficulty swallowing , nasal congestion. CV: Negative for chest pain, angina, palpitations, dyspnea on exertion, peripheral edema.  Respiratory: Negative for dyspnea at rest, dyspnea on exertion, cough, sputum, wheezing.  GI: See history of present illness. GU:  Negative for dysuria, hematuria, urinary incontinence, urinary frequency, nocturnal urination.  Endo: Negative for unusual weight change.    Physical Examination:   BP (!) 123/58 (BP Location: Left Arm, Patient Position: Sitting, Cuff Size: Normal)   Pulse 71   Temp 97.9 F (36.6 C) (Oral)   Ht '5\' 3"'$  (1.6 m)   Wt 137 lb 2 oz (62.2 kg)   BMI 24.29 kg/m   General: Well-nourished, well-developed in no acute distress.  Eyes: No icterus. Conjunctivae pink. Mouth: Oropharyngeal mucosa moist and pink , no lesions erythema or exudate. Neuro: Alert and oriented x 3.  Grossly intact. Skin: Warm and dry, no jaundice.   Psych: Alert and cooperative, normal mood and affect.   Imaging Studies: CT BONE MARROW BIOPSY & ASPIRATION  Result Date: 10/01/2022 INDICATION: Acute myeloid leukemia EXAM: CT GUIDED BONE MARROW ASPIRATES AND BIOPSY MEDICATIONS: None. ANESTHESIA/SEDATION: Fentanyl 100 mcg IV; Versed 2 mg IV Moderate Sedation Time:  20 minutes The patient was continuously monitored during the procedure by the interventional radiology nurse under my direct supervision. COMPLICATIONS: None immediate. PROCEDURE: The procedure was explained to the patient. The risks and benefits of the procedure were discussed and the patient's questions were addressed. Informed consent was obtained from the patient. The patient was placed prone on CT table. Images of the pelvis were obtained. The right side of back was prepped and draped in sterile fashion. The  skin and right posterior ilium were anesthetized with 1% lidocaine. 11 gauge bone needle was directed into the right ilium with CT guidance. Two aspirates and 2 core biopsy were obtained. Bandage placed over the puncture site. IMPRESSION: CT guided bone marrow aspiration and core biopsy. Electronically Signed   By: Miachel Roux M.D.   On: 10/01/2022 16:30    Assessment and Plan:   Roberta Curtis is a 82 y.o. y/o female here to follow-up for constipation .  Colonoscopy showed no abnormalities   Plan 1.  She has responded well to MiraLAX but 1 capful seems to be too much for her recommended to decrease to half a capful daily as an  I discussed the assessment and treatment plan with the patient. The patient was provided an opportunity to ask questions and all were answered. The patient agreed with the plan and demonstrated an understanding of the instructions.   The patient was advised to call back or seek an in-person evaluation if the symptoms worsen or if the condition fails to improve as anticipated.   Dr  Jonathon Bellows  MD,MRCP [facility]) Follow up as needed

## 2022-11-16 ENCOUNTER — Other Ambulatory Visit: Payer: Self-pay

## 2022-11-16 DIAGNOSIS — M8580 Other specified disorders of bone density and structure, unspecified site: Secondary | ICD-10-CM

## 2022-11-16 NOTE — Telephone Encounter (Signed)
[facility] pharmacy request for ibandronate (BONIVA) 150 MG tablet. Ok to fill med? LV- 09/25/22 LR- 04/21/22 ( 3 tabs/ 1 refill) NV- 02/04/23

## 2022-11-17 MED ORDER — IBANDRONATE SODIUM 150 MG PO TABS: 150.0000 mg | ORAL_TABLET | ORAL | 1 refills | Status: DC

## 2022-12-23 ENCOUNTER — Ambulatory Visit (INDEPENDENT_AMBULATORY_CARE_PROVIDER_SITE_OTHER): Payer: Medicare HMO | Admitting: Dermatology

## 2022-12-23 VITALS — BP 158/76 | HR 70

## 2022-12-23 DIAGNOSIS — L821 Other seborrheic keratosis: Secondary | ICD-10-CM

## 2022-12-23 DIAGNOSIS — L814 Other melanin hyperpigmentation: Secondary | ICD-10-CM

## 2022-12-23 DIAGNOSIS — L820 Inflamed seborrheic keratosis: Secondary | ICD-10-CM | POA: Diagnosis not present

## 2022-12-23 DIAGNOSIS — D1801 Hemangioma of skin and subcutaneous tissue: Secondary | ICD-10-CM | POA: Diagnosis not present

## 2022-12-23 DIAGNOSIS — L309 Dermatitis, unspecified: Secondary | ICD-10-CM

## 2022-12-23 DIAGNOSIS — L988 Other specified disorders of the skin and subcutaneous tissue: Secondary | ICD-10-CM

## 2022-12-23 MED ORDER — MOMETASONE FUROATE 0.1 % EX CREA: TOPICAL_CREAM | CUTANEOUS | 2 refills | Status: DC

## 2022-12-23 NOTE — Progress Notes (Signed)
Follow-Up Visit   Subjective  Roberta Curtis is a 82 y.o. female who presents for the following: Dark growth on the back that she noticed recently. She also has a growth on the inframammary that flakes off when rubbed and she is concerned about. She would also like to discuss dark spots on the face and filler of the oral commissure. She has a history of itchy rash on the left arm and fingers that comes and goes.   The patient has spots, moles and lesions to be evaluated, some may be new or changing.    The following portions of the chart were reviewed this encounter and updated as appropriate: medications, allergies, medical history  Review of Systems:  No other skin or systemic complaints except as noted in HPI or Assessment and Plan.  Objective  Well appearing patient in no apparent distress; mood and affect are within normal limits.  A focused examination was performed of the following areas: Face, trunk  Relevant physical exam findings are noted in the Assessment and Plan.  R spinal mid back Erythematous stuck-on, waxy papule    Assessment & Plan   Inflamed seborrheic keratosis R spinal mid back  Symptomatic, irritating, patient would like treated.  Destruction of lesion - R spinal mid back  Destruction method: cryotherapy   Informed consent: discussed and consent obtained   Lesion destroyed using liquid nitrogen: Yes   Region frozen until ice ball extended beyond lesion: Yes   Outcome: patient tolerated procedure well with no complications   Post-procedure details: wound care instructions given   Additional details:  Prior to procedure, discussed risks of blister formation, small wound, skin dyspigmentation, or rare scar following cryotherapy. Recommend Vaseline ointment to treated areas while healing.   HEMANGIOMA Exam: red papules Discussed benign nature. Recommend observation. Call for changes.  LENTIGINES Exam: scattered tan macules Due to sun  exposure Treatment Plan: Benign-appearing, observe. Recommend daily broad spectrum sunscreen SPF 30+ to sun-exposed areas, reapply every 2 hours as needed.  Call for any changes  SEBORRHEIC KERATOSIS - Stuck-on, waxy, tan-brown papules and macules of the inframammary, face - Benign-appearing - Discussed benign etiology and prognosis. - Observe - Call for any changes Discussed cosmetic procedure, noncovered.  $60 for 1st lesion and $15 for each additional lesion if done on the same day.  Maximum charge $350.  One touch-up treatment included no charge. Discussed risks of treatment including dyspigmentation, small scar, and/or recurrence. Recommend daily broad spectrum sunscreen SPF 30+/photoprotection to treated areas once healed.  FACIAL ELASTOSIS Exam: Rhytides and volume loss of the oral commissure.  Treatment Plan: Discussed filler. Patient would benefit more from 2 syringes ($650 per syringe), but would have some improvement with 1 syringe.   Recommend daily broad spectrum sunscreen SPF 30+ to sun-exposed areas, reapply every 2 hours as needed. Call for new or changing lesions.  Staying in the shade or wearing long sleeves, sun glasses (UVA+UVB protection) and wide brim hats (4-inch brim around the entire circumference of the hat) are also recommended for sun protection.   Purpura - Chronic; persistent and recurrent.  Treatable, but not curable. - Violaceous macules and patches - Benign - Related to trauma, age, sun damage and/or use of blood thinners, chronic use of topical and/or oral steroids - Observe - Can use OTC arnica containing moisturizer such as Dermend Bruise Formula if desired - Call for worsening or other concerns  Dermatitis  Exam: Pink excoriated papules on the L 4th and 5th fingers;  light pink scaly patch on the left forearm.  Chronic and persistent condition with duration or expected duration over one year. Condition is symptomatic / bothersome to patient. Not  to goal.  Atopic dermatitis (eczema) is a chronic, relapsing, pruritic condition that can significantly affect quality of life. It is often associated with allergic rhinitis and/or asthma and can require treatment with topical medications, phototherapy, or in severe cases biologic injectable medication (Dupixent; Adbry) or Oral JAK inhibitors.    Treatment Plan: Start mometasone cream Apply to AA rash QD/BID prn flares dsp 45g 2Rf. Topical steroids (such as triamcinolone, fluocinolone, fluocinonide, mometasone, clobetasol, halobetasol, betamethasone, hydrocortisone) can cause thinning and lightening of the skin if they are used for too long in the same area. Your physician has selected the right strength medicine for your problem and area affected on the body. Please use your medication only as directed by your physician to prevent side effects.    Return if symptoms worsen or fail to improve.  ICherlyn Labella, CMA, am acting as scribe for Willeen Niece, MD .   Documentation: I have reviewed the above documentation for accuracy and completeness, and I agree with the above.  Willeen Niece, MD

## 2022-12-23 NOTE — Patient Instructions (Addendum)
Start mometasone cream - Apply to rash on hands, fingers, arms 1-2 times a day until improved. Topical steroids (such as triamcinolone, fluocinolone, fluocinonide, mometasone, clobetasol, halobetasol, betamethasone, hydrocortisone) can cause thinning and lightening of the skin if they are used for too long in the same area. Your physician has selected the right strength medicine for your problem and area affected on the body. Please use your medication only as directed by your physician to prevent side effects.     Seborrheic Keratosis  What causes seborrheic keratoses? Seborrheic keratoses are harmless, common skin growths that first appear during adult life.  As time goes by, more growths appear.  Some people may develop a large number of them.  Seborrheic keratoses appear on both covered and uncovered body parts.  They are not caused by sunlight.  The tendency to develop seborrheic keratoses can be inherited.  They vary in color from skin-colored to gray, brown, or even black.  They can be either smooth or have a rough, warty surface.   Seborrheic keratoses are superficial and look as if they were stuck on the skin.  Under the microscope this type of keratosis looks like layers upon layers of skin.  That is why at times the top layer may seem to fall off, but the rest of the growth remains and re-grows.    Treatment Seborrheic keratoses do not need to be treated, but can easily be removed in the office.  Seborrheic keratoses often cause symptoms when they rub on clothing or jewelry.  Lesions can be in the way of shaving.  If they become inflamed, they can cause itching, soreness, or burning.  Removal of a seborrheic keratosis can be accomplished by freezing, burning, or surgery. If any spot bleeds, scabs, or grows rapidly, please return to have it checked, as these can be an indication of a skin cancer.  Due to recent changes in healthcare laws, you may see results of your pathology and/or  laboratory studies on MyChart before the doctors have had a chance to review them. We understand that in some cases there may be results that are confusing or concerning to you. Please understand that not all results are received at the same time and often the doctors may need to interpret multiple results in order to provide you with the best plan of care or course of treatment. Therefore, we ask that you please give [place] 2 business days to thoroughly review all your results before contacting the office for clarification. Should we see a critical lab result, you will be contacted sooner.   If You Need Anything After Your Visit  If you have any questions or concerns for your doctor, please call our main line at [phone] and press option 4 to reach your doctor's medical assistant. If no one answers, please leave a voicemail as directed and we will return your call as soon as possible. Messages left after 4 pm will be answered the following business day.   You may also send [place] a message via MyChart. We typically respond to MyChart messages within 1-2 business days.  For prescription refills, please ask your pharmacy to contact our office. Our fax number is [phone].  If you have an urgent issue when the clinic is closed that cannot wait until the next business day, you can page your doctor at the number below.    Please note that while we do our best to be available for urgent issues outside of office hours, we are not  available 24/7.   If you have an urgent issue and are unable to reach [place], you may choose to seek medical care at your doctor's office, retail clinic, urgent care center, or emergency room.  If you have a medical emergency, please immediately call 911 or go to the emergency department.  Pager Numbers  - Dr. Gwen Pounds: [phone]  - Dr. Neale Burly: [phone]  - Dr. Roseanne Reno: [phone]  In the event of inclement weather, please call our main line at [phone] for an  update on the status of any delays or closures.  Dermatology Medication Tips: Please keep the boxes that topical medications come in in order to help keep track of the instructions about where and how to use these. Pharmacies typically print the medication instructions only on the boxes and not directly on the medication tubes.   If your medication is too expensive, please contact our office at [phone] option 4 or send [place] a message through MyChart.   We are unable to tell what your co-pay for medications will be in advance as this is different depending on your insurance coverage. However, we may be able to find a substitute medication at lower cost or fill out paperwork to get insurance to cover a needed medication.   If a prior authorization is required to get your medication covered by your insurance company, please allow [place] 1-2 business days to complete this process.  Drug prices often vary depending on where the prescription is filled and some pharmacies may offer cheaper prices.  The website www.goodrx.com contains coupons for medications through different pharmacies. The prices here do not account for what the cost may be with help from insurance (it may be cheaper with your insurance), but the website can give you the price if you did not use any insurance.  - You can print the associated coupon and take it with your prescription to the pharmacy.  - You may also stop by our office during regular business hours and pick up a GoodRx coupon card.  - If you need your prescription sent electronically to a different pharmacy, notify our office through [facility] or by phone at [phone] option 4.     Si Usted Necesita Algo Despus de Su Visita  Tambin puede enviarnos un mensaje a travs de Clinical cytogeneticist. Por lo general respondemos a los mensajes de MyChart en el transcurso de 1 a 2 das hbiles.  Para renovar recetas, por favor pida a su farmacia que se ponga en contacto con  nuestra oficina. Annie Sable de fax es [place] [phone].  Si tiene un asunto urgente cuando la clnica est cerrada y que no puede esperar hasta el siguiente da hbil, puede llamar/localizar a su doctor(a) al nmero que aparece a continuacin.   Por favor, tenga en cuenta que aunque hacemos todo lo posible para estar disponibles para asuntos urgentes fuera del horario de [place], no estamos disponibles las 24 horas del da, los 7 [address]  [address] de la [place].   Si tiene un problema urgente y no puede comunicarse con nosotros, puede optar por buscar atencin mdica  en el consultorio de su doctor(a), en una clnica privada, en un centro de atencin urgente o en una sala de emergencias.  Si tiene Engineer, drilling, por favor llame inmediatamente al 911 o vaya a la sala de emergencias.  Nmeros de bper  - Dr. Gwen Pounds: [phone]  - Dra. Moye: [phone]  - Dra. Roseanne Reno: [phone]  En caso de inclemencias del tiempo, por favor llame  a Lacy Duverney principal al [phone] para una actualizacin sobre el [place] de cualquier retraso o cierre.  Consejos para la medicacin en dermatologa: Por favor, guarde las cajas en las que vienen los medicamentos de uso tpico para ayudarle a seguir las instrucciones sobre dnde y cmo usarlos. Las farmacias generalmente imprimen las instrucciones del medicamento slo en las cajas y no directamente en los tubos del [place].   Si su medicamento es muy caro, por favor, pngase en contacto con Rolm Gala llamando al [phone] y presione la opcin 4 o envenos un mensaje a travs de Clinical cytogeneticist.   No podemos decirle cul ser su copago por los medicamentos por adelantado ya que esto es diferente dependiendo de la cobertura de su seguro. Sin embargo, es posible que podamos encontrar un medicamento sustituto a Audiological scientist un formulario para que el seguro cubra el medicamento que se considera necesario.   Si se requiere una autorizacin  previa para que su compaa de seguros [place] su medicamento, por favor permtanos de 1 a 2 das hbiles para completar [address]  Los precios de los medicamentos varan con frecuencia dependiendo del Environmental consultant de dnde se surte la receta y alguna farmacias pueden ofrecer precios ms baratos.  El sitio web www.goodrx.com tiene cupones para medicamentos de Health and safety inspector. Los precios aqu no tienen en cuenta lo que podra costar con la ayuda del seguro (puede ser ms barato con su seguro), pero el sitio web puede darle el precio si no utiliz Tourist information centre manager.  - Puede imprimir el cupn correspondiente y llevarlo con su receta a la farmacia.  - Tambin puede pasar por nuestra oficina durante el horario de atencin regular y Education officer, museum una tarjeta de cupones de GoodRx.  - Si necesita que su receta se enve electrnicamente a una farmacia diferente, informe a nuestra oficina a travs de MyChart de [place] o por telfono llamando al [phone] y presione la opcin 4.

## 2023-02-04 ENCOUNTER — Ambulatory Visit (INDEPENDENT_AMBULATORY_CARE_PROVIDER_SITE_OTHER): Payer: Medicare HMO | Admitting: Family

## 2023-02-04 ENCOUNTER — Encounter: Payer: Self-pay | Admitting: Family

## 2023-02-04 VITALS — BP 130/68 | HR 64 | Temp 97.6°F | Ht 63.0 in | Wt 134.1 lb

## 2023-02-04 DIAGNOSIS — R42 Dizziness and giddiness: Secondary | ICD-10-CM

## 2023-02-04 DIAGNOSIS — C9201 Acute myeloblastic leukemia, in remission: Secondary | ICD-10-CM | POA: Diagnosis not present

## 2023-02-04 DIAGNOSIS — R519 Headache, unspecified: Secondary | ICD-10-CM | POA: Diagnosis not present

## 2023-02-04 DIAGNOSIS — Z136 Encounter for screening for cardiovascular disorders: Secondary | ICD-10-CM | POA: Diagnosis not present

## 2023-02-04 DIAGNOSIS — R7303 Prediabetes: Secondary | ICD-10-CM | POA: Diagnosis not present

## 2023-02-04 DIAGNOSIS — E538 Deficiency of other specified B group vitamins: Secondary | ICD-10-CM | POA: Diagnosis not present

## 2023-02-04 DIAGNOSIS — D509 Iron deficiency anemia, unspecified: Secondary | ICD-10-CM | POA: Diagnosis not present

## 2023-02-04 DIAGNOSIS — R29818 Other symptoms and signs involving the nervous system: Secondary | ICD-10-CM

## 2023-02-04 DIAGNOSIS — I340 Nonrheumatic mitral (valve) insufficiency: Secondary | ICD-10-CM

## 2023-02-04 LAB — CBC WITH DIFFERENTIAL/PLATELET
Basophils Absolute: 0.1 10*3/uL (ref 0.0–0.1)
Basophils Relative: 1.4 % (ref 0.0–3.0)
Eosinophils Absolute: 0.1 10*3/uL (ref 0.0–0.7)
Eosinophils Relative: 1.7 % (ref 0.0–5.0)
HCT: 38 % (ref 36.0–46.0)
Hemoglobin: 11.8 g/dL — ABNORMAL LOW (ref 12.0–15.0)
Lymphocytes Relative: 33 % (ref 12.0–46.0)
Lymphs Abs: 2.4 10*3/uL (ref 0.7–4.0)
MCHC: 31.2 g/dL (ref 30.0–36.0)
MCV: 76.4 fl — ABNORMAL LOW (ref 78.0–100.0)
Monocytes Absolute: 0.6 10*3/uL (ref 0.1–1.0)
Monocytes Relative: 8 % (ref 3.0–12.0)
Neutro Abs: 4.1 10*3/uL (ref 1.4–7.7)
Neutrophils Relative %: 55.9 % (ref 43.0–77.0)
Platelets: 247 10*3/uL (ref 150.0–400.0)
RBC: 4.97 Mil/uL (ref 3.87–5.11)
RDW: 15.7 % — ABNORMAL HIGH (ref 11.5–15.5)
WBC: 7.3 10*3/uL (ref 4.0–10.5)

## 2023-02-04 LAB — LIPID PANEL
Cholesterol: 151 mg/dL (ref 0–200)
HDL: 50.2 mg/dL (ref >39.00)
LDL Cholesterol: 82 mg/dL (ref 0–99)
NonHDL: 100.83
Total CHOL/HDL Ratio: 3
Triglycerides: 92 mg/dL (ref 0.0–149.0)
VLDL: 18.4 mg/dL (ref 0.0–40.0)

## 2023-02-04 LAB — SEDIMENTATION RATE: Sed Rate: 31 mm/hr — ABNORMAL HIGH (ref 0–30)

## 2023-02-04 LAB — COMPREHENSIVE METABOLIC PANEL
ALT: 11 U/L (ref 0–35)
AST: 19 U/L (ref 0–37)
Albumin: 4.3 g/dL (ref 3.5–5.2)
Alkaline Phosphatase: 57 U/L (ref 39–117)
BUN: 17 mg/dL (ref 6–23)
CO2: 28 mEq/L (ref 19–32)
Calcium: 9.6 mg/dL (ref 8.4–10.5)
Chloride: 106 mEq/L (ref 96–112)
Creatinine, Ser: 0.81 mg/dL (ref 0.40–1.20)
GFR: 68.32 mL/min (ref >60.00)
Glucose, Bld: 96 mg/dL (ref 70–99)
Potassium: 4.6 mEq/L (ref 3.5–5.1)
Sodium: 142 mEq/L (ref 135–145)
Total Bilirubin: 0.6 mg/dL (ref 0.2–1.2)
Total Protein: 7.2 g/dL (ref 6.0–8.3)

## 2023-02-04 LAB — IBC + FERRITIN
Ferritin: 39 ng/mL (ref 10.0–291.0)
Iron: 181 ug/dL — ABNORMAL HIGH (ref 42–145)
Saturation Ratios: 47 % (ref 20.0–50.0)
TIBC: 385 ug/dL (ref 250.0–450.0)
Transferrin: 275 mg/dL (ref 212.0–360.0)

## 2023-02-04 LAB — TSH: TSH: 1.31 u[IU]/mL (ref 0.35–5.50)

## 2023-02-04 LAB — VITAMIN B12: Vitamin B-12: 605 pg/mL (ref 211–911)

## 2023-02-04 LAB — HEMOGLOBIN A1C: Hgb A1c MFr Bld: 6.2 % (ref 4.6–6.5)

## 2023-02-04 LAB — C-REACTIVE PROTEIN: CRP: <1 mg/dL (ref 0.5–20.0)

## 2023-02-04 NOTE — Assessment & Plan Note (Signed)
Suspect due to daily tylenol and ibuprofen  Can cause rebound headaches.  Advised to try to stop.

## 2023-02-04 NOTE — Assessment & Plan Note (Signed)
Echocardiogram ordered , pending results.  Referral to cardiology as well for further eval as also with ongoing dizziness

## 2023-02-04 NOTE — Patient Instructions (Signed)
Ordering echocardiogram they should call you to schedule.   A referral was placed today for cardiology Please let us know if you have not heard back within 2 weeks about the referral.  Stop by the lab prior to leaving today. I will notify you of your results once received.   Regards,   Mort Sawyers FNP-C

## 2023-02-04 NOTE — Assessment & Plan Note (Addendum)
Ongoing  Meclizine prn  Ent workup negative Pending appt with neurologist for further evaluation Suspect possible vasovagal as typically worsens with straining  Suspect hypoglycemia as well, advised pt to eat every 2-3 hours during the day and increase water intake

## 2023-02-04 NOTE — Assessment & Plan Note (Signed)
Pt advised of the following: Work on a diabetic diet, try to incorporate exercise at least 20-30 a day for 3 days a week or more.   

## 2023-02-04 NOTE — Progress Notes (Signed)
Established Patient Office Visit  Subjective:      CC:  Chief Complaint  Patient presents with   Medical Management of Chronic Issues    6 month f/u on dizziness    HPI: Roberta Curtis is a 82 y.o. female presenting on 02/04/2023 for Medical Management of Chronic Issues (6 month f/u on dizziness) . Dizziness, intermittent.  ER in June 2023.  CTA negative for vessel occlusion or high grade stenosis.  Has appt with neurologist 6/26 for consult.   Constipation: still ongoing problem with constipation. She states when she gets bowel urgency she starts to feel dizzy. She does take laxative to encourage the bowel flow. She does take the meclizine when this occurs and it helps her dizziness to retract.   Denies any real allergic symptoms.  She does wake up with headache every am, and takes two tylenol and one advil every morning. Sometimes occasionally will be in the early evening.   She does state over the last one year she just feels 'off' . She states feels 'Roberta Curtis'  She does state that she doesn't often eat throughout the day and she has a decreased appetite. She doesn't drink a lot of water either throughout the day.   Has seen ENT in the past with unremarkable workup per her report. Do not have access to records.       Social history:  Relevant past medical, surgical, family and social history reviewed and updated as indicated. Interim medical history since our last visit reviewed.  Allergies and medications reviewed and updated.  DATA REVIEWED: CHART IN EPIC     ROS: Negative unless specifically indicated above in HPI.    Current Outpatient Medications:    albuterol (VENTOLIN HFA) 108 (90 Base) MCG/ACT inhaler, Inhale 1-2 puffs into the lungs as needed for wheezing or shortness of breath., Disp: 6.7 g, Rfl: 1   aspirin EC 81 MG tablet, Take 81 mg by mouth daily. Swallow whole., Disp: , Rfl:    Biotin 1 MG CAPS, Take 1,000 mcg by mouth daily. , Disp: ,  Rfl:    calcium carbonate (OS-CAL - DOSED IN MG OF ELEMENTAL CALCIUM) 1250 (500 Ca) MG tablet, Take 1 tablet by mouth daily., Disp: , Rfl:    Cholecalciferol (VITAMIN D3) 50 MCG (2000 UT) CAPS, Take 4,000 Units by mouth daily., Disp: , Rfl:    cyanocobalamin (VITAMIN B12) 500 MCG tablet, Take 500 mcg by mouth daily., Disp: , Rfl:    cyclobenzaprine (FLEXERIL) 5 MG tablet, Take 5 mg by mouth at bedtime., Disp: , Rfl:    esomeprazole (NEXIUM) 20 MG capsule, Take 1 capsule (20 mg total) by mouth daily at 12 noon., Disp: 90 capsule, Rfl: 1   ibandronate (BONIVA) 150 MG tablet, Take 1 tablet (150 mg total) by mouth every 30 (thirty) days., Disp: 3 tablet, Rfl: 1   magnesium oxide (MAG-OX) 400 (240 Mg) MG tablet, Take 400 mg by mouth daily., Disp: , Rfl:    meclizine (ANTIVERT) 12.5 MG tablet, Take 1 tablet (12.5 mg total) by mouth 3 (three) times daily as needed., Disp: 30 tablet, Rfl: 0   mometasone (ELOCON) 0.1 % cream, Apply to affected areas rash on hands, fingers, arm once to twice daily as needed until improved., Disp: 45 g, Rfl: 2      Objective:    BP 130/68 (BP Location: Left Arm, Patient Position: Sitting, Cuff Size: Normal)   Pulse 64   Temp 97.6 F (36.4 C) (Temporal)  Ht 5\' 3"  (1.6 m)   Wt 134 lb 2 oz (60.8 kg)   SpO2 99%   BMI 23.76 kg/m   Wt Readings from Last 3 Encounters:  02/04/23 134 lb 2 oz (60.8 kg)  10/29/22 137 lb 2 oz (62.2 kg)  10/12/22 138 lb 14.4 oz (63 kg)    Physical Exam Constitutional:      General: She is not in acute distress.    Appearance: Normal appearance. She is normal weight. She is not ill-appearing, toxic-appearing or diaphoretic.  HENT:     Head: Normocephalic.     Right Ear: Hearing, tympanic membrane, ear canal and external ear normal.     Left Ear: Tympanic membrane, ear canal and external ear normal. Decreased hearing noted.  Cardiovascular:     Rate and Rhythm: Normal rate and regular rhythm.  Pulmonary:     Effort: Pulmonary effort  is normal.  Musculoskeletal:        General: Normal range of motion.  Neurological:     General: No focal deficit present.     Mental Status: She is alert and oriented to person, place, and time. Mental status is at baseline.  Psychiatric:        Mood and Affect: Mood normal.        Behavior: Behavior normal.        Thought Content: Thought content normal.        Judgment: Judgment normal.           Assessment & Plan:  Prediabetes Assessment & Plan: Pt advised of the following: Work on a diabetic diet, try to incorporate exercise at least 20-30 a day for 3 days a week or more.    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  Advised to try to stop.    Orders: -     Sedimentation rate -     C-reactive protein  Neurological deficit present  Hypochromic-microcytic anemia -     CBC with Differential/Platelet -     Vitamin B12 -     IBC + Ferritin  Nonrheumatic mitral valve regurgitation Assessment & Plan: Echocardiogram ordered , pending results.  Referral to cardiology as well for further eval as also with ongoing dizziness  Orders: -     Ambulatory referral to Cardiology -     ECHOCARDIOGRAM COMPLETE; Future  Screening for cardiovascular condition -     Lipid panel     Return in about 6 months (around 08/07/2023) for f/u cholesterol.  Mort Sawyers, MSN, APRN, FNP-C [facility]  Medicine

## 2023-02-08 NOTE — Progress Notes (Signed)
Anemia stable. Iron is a bit high is she taking daily iron supplement? Sed rate (inflammatory marker) very very mildly elevated, not concerning.  B12 is at good level

## 2023-02-11 ENCOUNTER — Other Ambulatory Visit: Payer: Self-pay | Admitting: Family

## 2023-02-11 DIAGNOSIS — I340 Nonrheumatic mitral (valve) insufficiency: Secondary | ICD-10-CM

## 2023-02-11 DIAGNOSIS — I700 Atherosclerosis of aorta: Secondary | ICD-10-CM

## 2023-02-11 DIAGNOSIS — R42 Dizziness and giddiness: Secondary | ICD-10-CM

## 2023-02-11 DIAGNOSIS — I519 Heart disease, unspecified: Secondary | ICD-10-CM

## 2023-02-11 NOTE — Progress Notes (Signed)
Decrease iron intake to once daily this is too high.  That is why her iron is high because of what she is taking.  As far as the cardiology referral, she has appt pending to consult with neurology. I have ordered the echocardiogram (has she heard from anyone them yet to get this scheduled?)  As far as changing to urgent, I can not do this as I do not feel it is urgent at this time, and we can do part of the work up (which includes echocardiogram). One thing we can do is get her in office and do an EKG as well.   Of course give her red flag symptoms such as chest pain sob and go to er if any of these occur.

## 2023-02-22 ENCOUNTER — Ambulatory Visit (INDEPENDENT_AMBULATORY_CARE_PROVIDER_SITE_OTHER): Payer: Medicare HMO | Admitting: Family

## 2023-02-22 ENCOUNTER — Encounter: Payer: Self-pay | Admitting: Family

## 2023-02-22 VITALS — BP 126/62 | HR 70 | Temp 97.7°F | Ht 63.0 in | Wt 136.0 lb

## 2023-02-22 DIAGNOSIS — R42 Dizziness and giddiness: Secondary | ICD-10-CM | POA: Diagnosis not present

## 2023-02-22 DIAGNOSIS — I498 Other specified cardiac arrhythmias: Secondary | ICD-10-CM

## 2023-02-22 DIAGNOSIS — I499 Cardiac arrhythmia, unspecified: Secondary | ICD-10-CM | POA: Insufficient documentation

## 2023-02-22 DIAGNOSIS — I444 Left anterior fascicular block: Secondary | ICD-10-CM | POA: Diagnosis not present

## 2023-02-22 DIAGNOSIS — R7303 Prediabetes: Secondary | ICD-10-CM

## 2023-02-22 DIAGNOSIS — R9431 Abnormal electrocardiogram [ECG] [EKG]: Secondary | ICD-10-CM

## 2023-02-22 NOTE — Assessment & Plan Note (Signed)
EKG today in office  Cardiology appt in August for evaluation of dizziness chronic

## 2023-02-22 NOTE — Progress Notes (Signed)
Established Patient Office Visit  Subjective:      CC:  Chief Complaint  Patient presents with   Dizziness    Needs EKG     HPI: Roberta Curtis is a 82 y.o. female presenting on 02/22/2023 for Dizziness (Needs EKG ) . Pt is curious about her Malachi Bonds, she was taking iron twice daily and has since decreased to once daily.   She does take b12 once daily, 500 mcg once daily.  Lab Results  Component Value Date   VITAMINB12 605 02/04/2023   Dizziness: still ongoing, intermittent. Has appt with neurology 6/26 coming up  ER June 2023 CTA negative more often the dizziness/lightheadedness. Has been focusing on drinking slight more water and not really working on eating every 2-3 hours, has tried to not skip breakfast.         Social history:  Relevant past medical, surgical, family and social history reviewed and updated as indicated. Interim medical history since our last visit reviewed.  Allergies and medications reviewed and updated.  DATA REVIEWED: CHART IN EPIC     ROS: Negative unless specifically indicated above in HPI.    Current Outpatient Medications:    albuterol (VENTOLIN HFA) 108 (90 Base) MCG/ACT inhaler, Inhale 1-2 puffs into the lungs as needed for wheezing or shortness of breath., Disp: 6.7 g, Rfl: 1   aspirin EC 81 MG tablet, Take 81 mg by mouth daily. Swallow whole., Disp: , Rfl:    Biotin 1 MG CAPS, Take 1,000 mcg by mouth daily. , Disp: , Rfl:    calcium carbonate (OS-CAL - DOSED IN MG OF ELEMENTAL CALCIUM) 1250 (500 Ca) MG tablet, Take 1 tablet by mouth daily., Disp: , Rfl:    Cholecalciferol (VITAMIN D3) 50 MCG (2000 UT) CAPS, Take 4,000 Units by mouth daily., Disp: , Rfl:    cyanocobalamin (VITAMIN B12) 500 MCG tablet, Take 500 mcg by mouth daily., Disp: , Rfl:    cyclobenzaprine (FLEXERIL) 5 MG tablet, Take 5 mg by mouth at bedtime., Disp: , Rfl:    esomeprazole (NEXIUM) 20 MG capsule, Take 1 capsule (20 mg total) by mouth daily at 12 noon., Disp:  90 capsule, Rfl: 1   ibandronate (BONIVA) 150 MG tablet, Take 1 tablet (150 mg total) by mouth every 30 (thirty) days., Disp: 3 tablet, Rfl: 1   magnesium oxide (MAG-OX) 400 (240 Mg) MG tablet, Take 400 mg by mouth daily., Disp: , Rfl:    meclizine (ANTIVERT) 12.5 MG tablet, Take 1 tablet (12.5 mg total) by mouth 3 (three) times daily as needed., Disp: 30 tablet, Rfl: 0   mometasone (ELOCON) 0.1 % cream, Apply to affected areas rash on hands, fingers, arm once to twice daily as needed until improved., Disp: 45 g, Rfl: 2      Objective:    BP 126/62   Pulse 70   Temp 97.7 F (36.5 C) (Temporal)   Ht 5\' 3"  (1.6 m)   Wt 136 lb (61.7 kg)   SpO2 97%   BMI 24.09 kg/m   Wt Readings from Last 3 Encounters:  02/22/23 136 lb (61.7 kg)  02/04/23 134 lb 2 oz (60.8 kg)  10/29/22 137 lb 2 oz (62.2 kg)    Physical Exam Constitutional:      General: She is not in acute distress.    Appearance: Normal appearance. She is normal weight. She is not ill-appearing, toxic-appearing or diaphoretic.  HENT:     Head: Normocephalic.  Cardiovascular:  Rate and Rhythm: Normal rate and regular rhythm.  Pulmonary:     Effort: Pulmonary effort is normal.     Breath sounds: Normal breath sounds.  Musculoskeletal:        General: Normal range of motion.  Neurological:     General: No focal deficit present.     Mental Status: She is alert and oriented to person, place, and time. Mental status is at baseline.  Psychiatric:        Mood and Affect: Mood normal.        Behavior: Behavior normal.        Thought Content: Thought content normal.        Judgment: Judgment normal.           Assessment & Plan:  Dizziness Assessment & Plan: Ongoing.  Pt still not eating every 2-3 hours as recommended Advised pt to do this to see if maybe hypoglycemia throughout the day increasing symptoms.  Continue with water intake.  Neurology and cardiology work up pending appts  Negative ent work up and  negative GI workup   Abn EKG in office, possible left anterior fasc block, ongoing dizziness. Will try to see If cardiology would like to see her earlier.  No call to schedule echo as of yet, ordered 5/30 I am reaching out to referral coordinator to investigate referral. Advised pt if she has episode of syncope, sob suddenly go to er and or call 911.  Orders: -     EKG 12-Lead -     ECHOCARDIOGRAM COMPLETE; Future  Irregular heart beat Assessment & Plan: EKG today in office  Cardiology appt in August for evaluation of dizziness chronic    Orders: -     EKG 12-Lead -     ECHOCARDIOGRAM COMPLETE; Future  Left anterior fascicular block  Other cardiac arrhythmia -     ECHOCARDIOGRAM COMPLETE; Future  Abnormal EKG -     ECHOCARDIOGRAM COMPLETE; Future  Prediabetes Assessment & Plan: Did advise pt to start eating every 2-3 hours to avoid hypoglycemia and see if dizziness improves      Return in about 6 months (around 08/24/2023), or if symptoms worsen or fail to improve, for f/u CPE.  Mort Sawyers, MSN, APRN, FNP-C [facility] Medicine

## 2023-02-22 NOTE — Assessment & Plan Note (Signed)
Did advise pt to start eating every 2-3 hours to avoid hypoglycemia and see if dizziness improves

## 2023-02-22 NOTE — Assessment & Plan Note (Addendum)
Ongoing.  Pt still not eating every 2-3 hours as recommended Advised pt to do this to see if maybe hypoglycemia throughout the day increasing symptoms.  Continue with water intake.  Neurology and cardiology work up pending appts  Negative ent work up and negative GI workup   Abn EKG in office, possible left anterior fasc block, ongoing dizziness. Will try to see If cardiology would like to see her earlier.  No call to schedule echo as of yet, ordered 5/30 I am reaching out to referral coordinator to investigate referral. Advised pt if she has episode of syncope, sob suddenly go to er and or call 911.

## 2023-02-25 ENCOUNTER — Ambulatory Visit: Payer: Medicare HMO

## 2023-02-25 DIAGNOSIS — R42 Dizziness and giddiness: Secondary | ICD-10-CM | POA: Diagnosis not present

## 2023-02-25 DIAGNOSIS — R9431 Abnormal electrocardiogram [ECG] [EKG]: Secondary | ICD-10-CM | POA: Diagnosis not present

## 2023-02-25 DIAGNOSIS — I088 Other rheumatic multiple valve diseases: Secondary | ICD-10-CM

## 2023-02-25 DIAGNOSIS — I498 Other specified cardiac arrhythmias: Secondary | ICD-10-CM | POA: Diagnosis not present

## 2023-02-25 DIAGNOSIS — I499 Cardiac arrhythmia, unspecified: Secondary | ICD-10-CM | POA: Diagnosis not present

## 2023-02-25 LAB — ECHOCARDIOGRAM COMPLETE
AR max vel: 1.78 cm2
AV Area VTI: 1.71 cm2
AV Area mean vel: 1.74 cm2
AV Mean grad: 3 mmHg
AV Peak grad: 6.4 mmHg
Ao pk vel: 1.26 m/s
Area-P 1/2: 3.99 cm2
Calc EF: 53.4 %
P 1/2 time: 573 msec
S' Lateral: 3.1 cm
Single Plane A2C EF: 52.1 %
Single Plane A4C EF: 54.1 %

## 2023-02-26 ENCOUNTER — Encounter: Payer: Self-pay | Admitting: Cardiology

## 2023-02-26 ENCOUNTER — Ambulatory Visit (INDEPENDENT_AMBULATORY_CARE_PROVIDER_SITE_OTHER): Payer: Medicare HMO

## 2023-02-26 ENCOUNTER — Encounter: Payer: Self-pay | Admitting: Family

## 2023-02-26 ENCOUNTER — Ambulatory Visit: Payer: Medicare HMO | Attending: Cardiology | Admitting: Cardiology

## 2023-02-26 VITALS — BP 132/76 | HR 66 | Ht 62.0 in | Wt 137.0 lb

## 2023-02-26 DIAGNOSIS — R29810 Facial weakness: Secondary | ICD-10-CM

## 2023-02-26 DIAGNOSIS — I341 Nonrheumatic mitral (valve) prolapse: Secondary | ICD-10-CM | POA: Insufficient documentation

## 2023-02-26 DIAGNOSIS — I340 Nonrheumatic mitral (valve) insufficiency: Secondary | ICD-10-CM | POA: Diagnosis not present

## 2023-02-26 NOTE — Progress Notes (Signed)
Cardiology Office Note:    Date:  02/26/2023   ID:  Roberta Curtis, DOB 06-04-1941, MRN [id number]  PCP:  Mort Sawyers, FNP   [facility] Providers Cardiologist:  Debbe Odea, MD     Referring MD: Mort Sawyers, FNP   Chief Complaint  Patient presents with   New Patient (Initial Visit)    Was seen by cardiologist in [place] with last visit 20 years ago.  Referred by PCP for recent abnormal EKG and Echo done yesterday.  Patient denies new or acute cardiac problems/concerns today.      History of Present Illness:    Roberta Curtis is a 82 y.o. female with a hx of GERD, mitral regurgitation, leukemia who presents due to mitral valve regurgitation.  Diagnosed with trace mitral regurgitation in 2019.  Had a repeat echocardiogram yesterday to evaluate mitral valve, mitral valve reported to be mild to moderate, EF normal at 60 to 65%.  Admitted to the hospital a year ago due to left facial weakness and numbness.  Workup with CT angio and MRI was unrevealing.  Scheduled to see neurology.  States having history of vertigo, occasionally has dizziness for which she takes Antivert.  Denies chest pain or shortness of breath.  Denies palpitations.  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HCC)    GERD (gastroesophageal reflux disease)    Heart murmur    Osteopenia    Thrush     Past Surgical History:  Procedure Laterality Date   BONE MARROW TRANSPLANT     BREAST LUMPECTOMY Left    fatty IONGE,9528'U   CATARACT EXTRACTION     CESAREAN SECTION     COLONOSCOPY WITH PROPOFOL N/A 10/06/2022   Procedure: COLONOSCOPY WITH PROPOFOL;  Surgeon: Wyline Mood, MD;  Location: [facility] ENDOSCOPY;  Service: Gastroenterology;  Laterality: N/A;   EYE SURGERY     HYSTEROTOMY     LYMPH GLAND EXCISION     MENISCUS REPAIR Right    NECK SURGERY     SALPINGECTOMY     WISDOM TOOTH EXTRACTION      Current Medications: Current Meds  Medication Sig   albuterol  (VENTOLIN HFA) 108 (90 Base) MCG/ACT inhaler Inhale 1-2 puffs into the lungs as needed for wheezing or shortness of breath.   aspirin EC 81 MG tablet Take 81 mg by mouth daily. Swallow whole.   Biotin 1 MG CAPS Take 1,000 mcg by mouth daily.    calcium carbonate (OS-CAL - DOSED IN MG OF ELEMENTAL CALCIUM) 1250 (500 Ca) MG tablet Take 1 tablet by mouth daily.   Cholecalciferol (VITAMIN D3) 50 MCG (2000 UT) CAPS Take 4,000 Units by mouth daily.   cyanocobalamin (VITAMIN B12) 500 MCG tablet Take 500 mcg by mouth daily.   esomeprazole (NEXIUM) 20 MG capsule Take 1 capsule (20 mg total) by mouth daily at 12 noon.   ibandronate (BONIVA) 150 MG tablet Take 1 tablet (150 mg total) by mouth every 30 (thirty) days.   magnesium oxide (MAG-OX) 400 (240 Mg) MG tablet Take 400 mg by mouth daily.   meclizine (ANTIVERT) 12.5 MG tablet Take 1 tablet (12.5 mg total) by mouth 3 (three) times daily as needed.   Potassium 99 MG TABS Take 1 tablet by mouth daily.     Allergies:   Jadenu [deferasirox], Ketotifen fumarate, Levofloxacin, Ondansetron hcl, Codeine, Oxycodone-acetaminophen, and Penicillins   Social History   Socioeconomic History   Marital status: Widowed    Spouse name: Casimiro Needle  Fiorello   Number of children: 3   Years of education: high school   Highest education level: Not on file  Occupational History   Occupation: retired  Tobacco Use   Smoking status: Former    Packs/day: 1.00    Years: 6.00    Additional pack years: 0.00    Total pack years: 6.00    Types: Cigarettes    Quit date: 02/20/1970    Years since quitting: 53.0   Smokeless tobacco: Never  Vaping Use   Vaping Use: Never used  Substance and Sexual Activity   Alcohol use: Not Currently   Drug use: Never   Sexual activity: Not Currently    Partners: Male    Birth control/protection: Post-menopausal  Other Topics Concern   Not on file  Social History Narrative      Right handed   Lives in a condo with domestic partner    From [place]      02/12/20   From: [place], moved to be near daugther   Living: with Riley Nearing - husband   Health Care Proxy: Lucia Gaskins (daughter)   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      Family: Actor, lives in [place]      Enjoys: meet new people, socialable      Exercise: not currently - cleaning   Diet: picky eater - eats veggies - but her husband doesn't like these things      Safety   Seat belts: Yes    Guns: Yes  and secure   Safe in relationships: Yes    Social Determinants of Health   Financial Resource Strain: Low Risk  (02/18/2021)   Overall Financial Resource Strain (CARDIA)    Difficulty of Paying Living Expenses: Not hard at all  Food Insecurity: No Food Insecurity (02/18/2021)   Hunger Vital Sign    Worried About Running Out of Food in the Last Year: Never true    Ran Out of Food in the Last Year: Never true  Transportation Needs: No Transportation Needs (02/18/2021)   PRAPARE - Administrator, Civil Service (Medical): No    Lack of Transportation (Non-Medical): No  Physical Activity: Inactive (02/18/2021)   Exercise Vital Sign    Days of Exercise per Week: 0 days    Minutes of Exercise per Session: 0 min  Stress: No Stress Concern Present (02/18/2021)   [employer] of Occupational Health - Occupational Stress Questionnaire    Feeling of Stress : Not at all  Social Connections: Not on file     Family History: The patient's family history includes Blindness in her brother; Heart disease in her brother and father; Liver cancer in her father; Retinitis pigmentosa in her mother; Stroke in her mother.  ROS:   Please see the history of present illness.     All other systems reviewed and are negative.  EKGs/Labs/Other Studies Reviewed:    The following studies were reviewed today:     EKG obtained today, EKG shows normal sinus rhythm,  Recent  Labs: 02/04/2023: ALT 11; BUN 17; Creatinine, Ser 0.81; Hemoglobin 11.8; Platelets 247.0; Potassium 4.6; Sodium 142; TSH 1.31  Recent Lipid Panel    Component Value Date/Time   CHOL 151 02/04/2023 1012   TRIG 92.0 02/04/2023 1012   HDL 50.20 02/04/2023 1012   CHOLHDL 3 02/04/2023 1012   VLDL 18.4 02/04/2023 1012   LDLCALC 82 02/04/2023 1012  Risk Assessment/Calculations:             Physical Exam:    VS:  BP 132/76 (BP Location: Left Arm, Patient Position: Sitting, Cuff Size: Normal)   Pulse 66   Ht 5\' 2"  (1.575 m)   Wt 137 lb (62.1 kg)   SpO2 97%   BMI 25.06 kg/m     Wt Readings from Last 3 Encounters:  02/26/23 137 lb (62.1 kg)  02/22/23 136 lb (61.7 kg)  02/04/23 134 lb 2 oz (60.8 kg)     GEN:  Well nourished, well developed in no acute distress HEENT: Normal NECK: No JVD; No carotid bruits CARDIAC: Regular rate, occasional skipped heartbeats, no murmurs, rubs, gallops RESPIRATORY:  Clear to auscultation without rales, wheezing or rhonchi  ABDOMEN: Soft, non-tender, non-distended MUSCULOSKELETAL:  No edema; No deformity  SKIN: Warm and dry NEUROLOGIC:  Alert and oriented x 3 PSYCHIATRIC:  Normal affect   ASSESSMENT:    1. Mitral valve insufficiency, unspecified etiology   2. Weakness on left side of face    PLAN:    In order of problems listed above:  Mitral valve regurgitation, echocardiogram reviewed by myself showing mild MR.  Ejection fraction is normal, EF 60%.  No significant change from prior echo in 2019 which reported trace MR.  Repeat echocardiogram every 3 to 5 years as per [facility] AHA guidelines. Left facial weakness, 1 year ago, occasional left facial numbness.  Prior CT and brain MRI unrevealing. place cardiac monitor to rule out any arrhythmia such as A-fib or flutter.  EKG showing sinus rhythm, occasional skipped heartbeats on exam.  Follow-up in 2 months after cardiac monitor      Medication Adjustments/Labs and Tests Ordered: Current  medicines are reviewed at length with the patient today.  Concerns regarding medicines are outlined above.  Orders Placed This Encounter  Procedures   LONG TERM MONITOR (3-14 DAYS)   EKG 12-Lead   No orders of the defined types were placed in this encounter.   Patient Instructions  Medication Instructions:   Your physician recommends that you continue on your current medications as directed. Please refer to the Current Medication list given to you today.  *If you need a refill on your cardiac medications before your next appointment, please call your pharmacy*   Lab Work:  None ordered  If you have labs (blood work) drawn today and your tests are completely normal, you will receive your results only by: MyChart Message (if you have MyChart) OR A paper copy in the mail If you have any lab test that is abnormal or we need to change your treatment, we will call you to review the results.   Testing/Procedures:  Your physician has recommended that you wear a Zio monitor.   This monitor is a medical device that records the heart's electrical activity. Doctors most often use these monitors to diagnose arrhythmias. Arrhythmias are problems with the speed or rhythm of the heartbeat. The monitor is a small device applied to your chest. You can wear one while you do your normal daily activities. While wearing this monitor if you have any symptoms to push the button and record what you felt. Once you have worn this monitor for the period of time provider prescribed (Usually 14 days), you will return the monitor device in the postage paid box. Once it is returned they will download the data collected and provide [place] with a report which the provider will then review and we will call  you with those results. Important tips:  Avoid showering during the first 24 hours of wearing the monitor. Avoid excessive sweating to help maximize wear time. Do not submerge the device, no hot tubs, and no swimming  pools. Keep any lotions or oils away from the patch. After 24 hours you may shower with the patch on. Take brief showers with your back facing the shower head.  Do not remove patch once it has been placed because that will interrupt data and decrease adhesive wear time. Push the button when you have any symptoms and write down what you were feeling. Once you have completed wearing your monitor, remove and place into box which has postage paid and place in your outgoing mailbox.  If for some reason you have misplaced your box then call our office and we can provide another box and/or mail it off for you.      Follow-Up: At [facility], you and your health needs are our priority.  As part of our continuing mission to provide you with exceptional heart care, we have created designated Provider Care Teams.  These Care Teams include your primary Cardiologist (physician) and Advanced Practice Providers (APPs -  Physician Assistants and Nurse Practitioners) who all work together to provide you with the care you need, when you need it.  We recommend signing up for the patient portal called "MyChart".  Sign up information is provided on this After Visit Summary.  MyChart is used to connect with patients for Virtual Visits (Telemedicine).  Patients are able to view lab/test results, encounter notes, upcoming appointments, etc.  Non-urgent messages can be sent to your provider as well.   To learn more about what you can do with MyChart, go to [email or web address].    Your next appointment:   2 - 3  month(s)  Provider:   You may see Debbe Odea, MD or one of the following Advanced Practice Providers on your designated Care Team:   Nicolasa Ducking, NP Eula Listen, PA-C Cadence Fransico Michael, PA-C Charlsie Quest, NP    Signed, Debbe Odea, MD  02/26/2023 2:49 PM    [facility]

## 2023-02-26 NOTE — Patient Instructions (Signed)
Medication Instructions:   Your physician recommends that you continue on your current medications as directed. Please refer to the Current Medication list given to you today.  *If you need a refill on your cardiac medications before your next appointment, please call your pharmacy*   Lab Work:  None ordered  If you have labs (blood work) drawn today and your tests are completely normal, you will receive your results only by: MyChart Message (if you have MyChart) OR A paper copy in the mail If you have any lab test that is abnormal or we need to change your treatment, we will call you to review the results.   Testing/Procedures:  Your physician has recommended that you wear a Zio monitor.   This monitor is a medical device that records the heart's electrical activity. Doctors most often use these monitors to diagnose arrhythmias. Arrhythmias are problems with the speed or rhythm of the heartbeat. The monitor is a small device applied to your chest. You can wear one while you do your normal daily activities. While wearing this monitor if you have any symptoms to push the button and record what you felt. Once you have worn this monitor for the period of time provider prescribed (Usually 14 days), you will return the monitor device in the postage paid box. Once it is returned they will download the data collected and provide [place] with a report which the provider will then review and we will call you with those results. Important tips:  Avoid showering during the first 24 hours of wearing the monitor. Avoid excessive sweating to help maximize wear time. Do not submerge the device, no hot tubs, and no swimming pools. Keep any lotions or oils away from the patch. After 24 hours you may shower with the patch on. Take brief showers with your back facing the shower head.  Do not remove patch once it has been placed because that will interrupt data and decrease adhesive wear time. Push the button  when you have any symptoms and write down what you were feeling. Once you have completed wearing your monitor, remove and place into box which has postage paid and place in your outgoing mailbox.  If for some reason you have misplaced your box then call our office and we can provide another box and/or mail it off for you.      Follow-Up: At [facility], you and your health needs are our priority.  As part of our continuing mission to provide you with exceptional heart care, we have created designated Provider Care Teams.  These Care Teams include your primary Cardiologist (physician) and Advanced Practice Providers (APPs -  Physician Assistants and Nurse Practitioners) who all work together to provide you with the care you need, when you need it.  We recommend signing up for the patient portal called "MyChart".  Sign up information is provided on this After Visit Summary.  MyChart is used to connect with patients for Virtual Visits (Telemedicine).  Patients are able to view lab/test results, encounter notes, upcoming appointments, etc.  Non-urgent messages can be sent to your provider as well.   To learn more about what you can do with MyChart, go to [email or web address].    Your next appointment:   2 - 3  month(s)  Provider:   You may see Debbe Odea, MD or one of the following Advanced Practice Providers on your designated Care Team:   Nicolasa Ducking, NP Eula Listen, PA-C Cadence Fransico Michael, PA-C [place],  NP

## 2023-03-03 DIAGNOSIS — I340 Nonrheumatic mitral (valve) insufficiency: Secondary | ICD-10-CM | POA: Diagnosis not present

## 2023-03-12 ENCOUNTER — Ambulatory Visit: Payer: Medicare HMO

## 2023-03-16 ENCOUNTER — Telehealth: Payer: Self-pay | Admitting: Family

## 2023-03-16 NOTE — Telephone Encounter (Signed)
Contacted Roberta Curtis to schedule their annual wellness visit. Patient declined to schedule AWV at this time.Please discuss with patient at next office visit.  [facility] Care Guide [facility] AWV TEAM Direct Dial: [phone]

## 2023-03-18 NOTE — Telephone Encounter (Signed)
Noted  

## 2023-03-31 ENCOUNTER — Other Ambulatory Visit: Payer: Self-pay | Admitting: Psychiatry

## 2023-03-31 DIAGNOSIS — R278 Other lack of coordination: Secondary | ICD-10-CM

## 2023-03-31 DIAGNOSIS — H532 Diplopia: Secondary | ICD-10-CM

## 2023-04-07 ENCOUNTER — Other Ambulatory Visit: Payer: Self-pay | Admitting: Family

## 2023-04-08 ENCOUNTER — Ambulatory Visit: Payer: Medicare HMO

## 2023-04-09 ENCOUNTER — Inpatient Hospital Stay: Payer: Medicare HMO | Attending: Oncology

## 2023-04-09 DIAGNOSIS — C9201 Acute myeloblastic leukemia, in remission: Secondary | ICD-10-CM | POA: Diagnosis present

## 2023-04-09 DIAGNOSIS — Z808 Family history of malignant neoplasm of other organs or systems: Secondary | ICD-10-CM | POA: Diagnosis not present

## 2023-04-09 DIAGNOSIS — Z87891 Personal history of nicotine dependence: Secondary | ICD-10-CM | POA: Insufficient documentation

## 2023-04-09 DIAGNOSIS — Z79899 Other long term (current) drug therapy: Secondary | ICD-10-CM | POA: Diagnosis not present

## 2023-04-09 DIAGNOSIS — Z148 Genetic carrier of other disease: Secondary | ICD-10-CM | POA: Diagnosis not present

## 2023-04-09 LAB — COMPREHENSIVE METABOLIC PANEL
ALT: 19 U/L (ref 0–44)
AST: 22 U/L (ref 15–41)
Albumin: 3.8 g/dL (ref 3.5–5.0)
Alkaline Phosphatase: 53 U/L (ref 38–126)
Anion gap: 7 (ref 5–15)
BUN: 12 mg/dL (ref 8–23)
CO2: 26 mmol/L (ref 22–32)
Calcium: 9.2 mg/dL (ref 8.9–10.3)
Chloride: 107 mmol/L (ref 98–111)
Creatinine, Ser: 0.68 mg/dL (ref 0.44–1.00)
GFR, Estimated: >60 mL/min (ref >60)
Glucose, Bld: 103 mg/dL — ABNORMAL HIGH (ref 70–99)
Potassium: 4.1 mmol/L (ref 3.5–5.1)
Sodium: 140 mmol/L (ref 135–145)
Total Bilirubin: 0.6 mg/dL (ref 0.3–1.2)
Total Protein: 7.1 g/dL (ref 6.5–8.1)

## 2023-04-09 LAB — CBC WITH DIFFERENTIAL/PLATELET
Abs Immature Granulocytes: 0.02 10*3/uL (ref 0.00–0.07)
Basophils Absolute: 0.1 10*3/uL (ref 0.0–0.1)
Basophils Relative: 1 %
Eosinophils Absolute: 0.3 10*3/uL (ref 0.0–0.5)
Eosinophils Relative: 3 %
HCT: 36.7 % (ref 36.0–46.0)
Hemoglobin: 11.8 g/dL — ABNORMAL LOW (ref 12.0–15.0)
Immature Granulocytes: 0 %
Lymphocytes Relative: 26 %
Lymphs Abs: 2.3 10*3/uL (ref 0.7–4.0)
MCH: 23.9 pg — ABNORMAL LOW (ref 26.0–34.0)
MCHC: 32.2 g/dL (ref 30.0–36.0)
MCV: 74.4 fL — ABNORMAL LOW (ref 80.0–100.0)
Monocytes Absolute: 0.7 10*3/uL (ref 0.1–1.0)
Monocytes Relative: 8 %
Neutro Abs: 5.5 10*3/uL (ref 1.7–7.7)
Neutrophils Relative %: 62 %
Platelets: 229 10*3/uL (ref 150–400)
RBC: 4.93 MIL/uL (ref 3.87–5.11)
RDW: 15.9 % — ABNORMAL HIGH (ref 11.5–15.5)
WBC: 8.8 10*3/uL (ref 4.0–10.5)
nRBC: 0 % (ref 0.0–0.2)

## 2023-04-09 LAB — VITAMIN B12: Vitamin B-12: 493 pg/mL (ref 180–914)

## 2023-04-09 LAB — LACTATE DEHYDROGENASE: LDH: 132 U/L (ref 98–192)

## 2023-04-09 LAB — FOLATE: Folate: 14.1 ng/mL (ref >5.9)

## 2023-04-10 ENCOUNTER — Other Ambulatory Visit: Payer: Self-pay | Admitting: Family

## 2023-04-10 DIAGNOSIS — K219 Gastro-esophageal reflux disease without esophagitis: Secondary | ICD-10-CM

## 2023-04-10 DIAGNOSIS — M8580 Other specified disorders of bone density and structure, unspecified site: Secondary | ICD-10-CM

## 2023-04-12 ENCOUNTER — Encounter: Payer: Self-pay | Admitting: Oncology

## 2023-04-12 ENCOUNTER — Inpatient Hospital Stay (HOSPITAL_BASED_OUTPATIENT_CLINIC_OR_DEPARTMENT_OTHER): Payer: Medicare HMO | Admitting: Oncology

## 2023-04-12 VITALS — BP 132/82 | HR 66 | Temp 97.6°F | Resp 18 | Wt 134.6 lb

## 2023-04-12 DIAGNOSIS — C9201 Acute myeloblastic leukemia, in remission: Secondary | ICD-10-CM | POA: Diagnosis not present

## 2023-04-12 DIAGNOSIS — Z148 Genetic carrier of other disease: Secondary | ICD-10-CM | POA: Diagnosis not present

## 2023-04-12 NOTE — Assessment & Plan Note (Addendum)
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 No increase blast.  Cytogenetics were normal. Labs reviewed and discussed with patient.  Continue clinica surveillance. I will hold off repeating bone marrow in 6 months.

## 2023-04-12 NOTE — Assessment & Plan Note (Signed)
Recommend calcium and Vitamin D supplementation.

## 2023-04-12 NOTE — Progress Notes (Signed)
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 No increase blast.  Cytogenetics were normal. Labs reviewed and discussed with patient.  Continue clinica surveillance. I will hold off repeating bone marrow in 6 months.   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  Continue monitor.   Hypocalcemia Recommend calcium and Vitamin D supplementation.   Orders Placed This Encounter  Procedures   CBC with Differential (Cancer Center Only)    Standing Status:   Future    Standing Expiration Date:   04/11/2024   CMP (Cancer Center only)    Standing Status:   Future    Standing Expiration Date:   04/11/2024   Lactate dehydrogenase    Standing Status:   Future    Standing Expiration Date:   04/11/2024   Vitamin B12    Standing Status:   Future    Standing Expiration Date:   04/11/2024   Folate    Standing Status:   Future    Standing Expiration Date:   04/11/2024    Follow up in 6 months. All questions were answered. The patient knows to call the clinic with any problems, questions or concerns.  Rickard Patience, MD, PhD [facility] Hematology Oncology 04/12/2023     HISTORY OF PRESENTING ILLNESS:   Roberta Curtis is a  82 y.o.  female with PMH listed below was seen in consultation at the request of  Dugal, Tabitha, FNP to establish care for  history of AML status post bone marrow transplant, hemochromatosis carrier. Patient moved from [place] to [place] in February 2021. She brought some  of her oncology records. 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  Patient received induction daunorubincin and cytarabin.  04/18/2006- axilla mass exicision - microabscess with gram positive cocci.   04/19/2006, bone marrow biopsy showed 50% cellularity, chemotherapeutic effect, day 14.  Persistent leukemia given persistent  CD117 cells, CD34 positive cells are slightly increased. 04/28/2006, bone marrow biopsy showed 50 to 75% cellularity with immature infiltrates, mild maturation present, markedly decreased erythroid elements with dyserythropoiesis, marked megakaryocytosis with dysplastic morphology, increased iron stores consistent with acute myeloid leukemia with multilineage dysplastic 05/28/2006 per note,  bone marrow which showed AML in remission 08/18/2006-admitted for bone marrow transplant, received busulfan and etoposide.  Received washed autologous peripheral blood stem cells on 08/27/2006.  Engraftment was noted on day 13-09/09/2006  Previously followed up with oncologist and has been having bone marrow biopsy surveillance annually and she has stayed in remission. .  Her last Bone marrow biopsy was done in 2019.  05/26/2018 Bone marrow biopsy showed normocellular marrow with adequate trilineage hematopoiesis, no morphology evidence of increase blasts, consistent with AML in remission.  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  Detail  of mutation is unknown. She reports that she has had phlebotomy in the past. Reports she has had right axillary lymph node biopsy and was told by her doctors not to have blood drawn on the right upper extremity due to risk of lymphedema.  Patient tells me that her husband passed away from [facility] many years ago.  Jan 2023 Bone marrow biopsy results were reviewed and discussed with patient. No leukemia, lymphoma or high-grade dysplasia.  Cytogenetics were normal.   Jan 2024 Bone marrow biopsy showed 20% marrow cellularity, no increased blasts.  Cytogenetics were normal.  INTERVAL HISTORY Roberta Curtis is a 82 y.o. female who has above history reviewed by me today presents for follow up visit for history of AML Patient reports feeling well.  Denies any constitutional symptoms. No new complaints. She takes B12 supplementation.  + Constipation  Review of Systems  Constitutional:  Negative for appetite change, chills, fatigue and fever.  HENT:   Negative for hearing loss and voice change.   Eyes:  Negative for eye problems.  Respiratory:  Negative for chest tightness and cough.   Cardiovascular:  Negative for chest pain.  Gastrointestinal:  Positive for constipation. Negative for abdominal distention, abdominal pain and blood in stool.  Endocrine: Negative for hot flashes.  Genitourinary:  Negative for difficulty urinating and frequency.   Musculoskeletal:  Negative for arthralgias.  Skin:  Negative for itching and rash.  Neurological:  Positive for light-headedness. Negative for extremity weakness.       Left side weakness  Hematological:  Negative for adenopathy.  Psychiatric/Behavioral:  Negative for confusion.     MEDICAL HISTORY:  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HCC)    GERD (gastroesophageal reflux disease)    Heart murmur    Osteopenia    Thrush     SURGICAL HISTORY: Past Surgical History:  Procedure Laterality Date   BONE MARROW  TRANSPLANT     BREAST LUMPECTOMY Left    fatty tumor,1970's   CATARACT EXTRACTION     CESAREAN SECTION     COLONOSCOPY WITH PROPOFOL N/A 10/06/2022   Procedure: COLONOSCOPY WITH PROPOFOL;  Surgeon: Wyline Mood, MD;  Location: [facility] ENDOSCOPY;  Service: Gastroenterology;  Laterality: N/A;   EYE SURGERY     HYSTEROTOMY     LYMPH GLAND EXCISION     MENISCUS REPAIR Right    NECK SURGERY     SALPINGECTOMY     WISDOM TOOTH EXTRACTION      SOCIAL HISTORY: Social History   Socioeconomic History   Marital status: Widowed    Spouse name: Riley Nearing   Number of children: 3   Years of education: high school   Highest education level: Not on file  Occupational History   Occupation: retired  Tobacco Use   Smoking status: Former    Current packs/day: 0.00    Average packs/day: 1 pack/day for 6.0 years (6.0 ttl pk-yrs)    Types: Cigarettes    Start date: 02/21/1964    Quit date: 02/20/1970    Years since quitting: 53.1   Smokeless tobacco: Never  Vaping Use   Vaping status: Never Used  Substance and Sexual Activity   Alcohol use: Not Currently   Drug use: Never   Sexual activity: Not Currently    Partners: Male    Birth control/protection: Post-menopausal  Other Topics Concern  Not on file  Social History Narrative      Right handed   Lives in a condo with domestic partner   From [place]      02/12/20   From: [place], moved to be near daugther   Living: with Riley Nearing - husband   Health Care Proxy: Lucia Gaskins (daughter)   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      Family: Actor, lives in [place]), [place], [place]), [place] ([place]      Enjoys: meet new people, socialable      Exercise: not currently - cleaning   Diet: picky eater - eats veggies - but her husband doesn't like these things      Safety   Seat belts: Yes    Guns: Yes  and secure   Safe in relationships: Yes    Social Determinants of Health    Financial Resource Strain: Low Risk  (02/18/2021)   Overall Financial Resource Strain (CARDIA)    Difficulty of Paying Living Expenses: Not hard at all  Food Insecurity: No Food Insecurity (02/18/2021)   Hunger Vital Sign    Worried About Running Out of Food in the Last Year: Never true    Ran Out of Food in the Last Year: Never true  Transportation Needs: No Transportation Needs (02/18/2021)   PRAPARE - Administrator, Civil Service (Medical): No    Lack of Transportation (Non-Medical): No  Physical Activity: Inactive (02/18/2021)   Exercise Vital Sign    Days of Exercise per Week: 0 days    Minutes of Exercise per Session: 0 min  Stress: No Stress Concern Present (02/18/2021)   [employer] of Occupational Health - Occupational Stress Questionnaire    Feeling of Stress : Not at all  Social Connections: Not on file  Intimate Partner Violence: Not At Risk (02/18/2021)   Humiliation, Afraid, Rape, and Kick questionnaire    Fear of Current or Ex-Partner: No    Emotionally Abused: No    Physically Abused: No    Sexually Abused: No    FAMILY HISTORY: Family History  Problem Relation Age of Onset   Retinitis pigmentosa Mother    Stroke Mother    Liver cancer Father    Heart disease Father    Heart disease Brother    Blindness Brother     ALLERGIES:  is allergic to jadenu [deferasirox], ketotifen fumarate, levofloxacin, ondansetron hcl, codeine, oxycodone-acetaminophen, and penicillins.  MEDICATIONS:  Current Outpatient Medications  Medication Sig Dispense Refill   albuterol (VENTOLIN HFA) 108 (90 Base) MCG/ACT inhaler Inhale 1-2 puffs into the lungs as needed for wheezing or shortness of breath. 6.7 g 1   aspirin EC 81 MG tablet Take 81 mg by mouth daily. Swallow whole.     Biotin 1 MG CAPS Take 1,000 mcg by mouth daily.      calcium carbonate (OS-CAL - DOSED IN MG OF ELEMENTAL CALCIUM) 1250 (500 Ca) MG tablet Take 1 tablet by mouth daily.     Cholecalciferol  (VITAMIN D3) 50 MCG (2000 UT) CAPS Take 4,000 Units by mouth daily.     cyanocobalamin (VITAMIN B12) 500 MCG tablet Take 500 mcg by mouth daily.     magnesium oxide (MAG-OX) 400 (240 Mg) MG tablet Take 400 mg by mouth daily.     meclizine (ANTIVERT) 12.5 MG tablet TAKE 1 TABLET BY MOUTH 3 TIMES DAILY AS NEEDED. 30 tablet 0   Potassium 99 MG TABS Take 1  tablet by mouth daily.     vitamin C (ASCORBIC ACID) 250 MG tablet Take 250 mg by mouth daily.     esomeprazole (NEXIUM) 20 MG capsule TAKE 1 CAPSULE (20 MG TOTAL) BY MOUTH DAILY AT 12 NOON. 90 capsule 1   ibandronate (BONIVA) 150 MG tablet TAKE 1 TABLET (150 MG TOTAL) BY MOUTH EVERY 30 (THIRTY) DAYS. 3 tablet 1   No current facility-administered medications for this visit.     PHYSICAL EXAMINATION: ECOG PERFORMANCE STATUS: 0 - Asymptomatic Vitals:   04/12/23 1015  BP: 132/82  Pulse: 66  Resp: 18  Temp: 97.6 F (36.4 C)   Filed Weights   04/12/23 1015  Weight: 134 lb 9.6 oz (61.1 kg)    Physical Exam Constitutional:      General: She is not in acute distress. HENT:     Head: Normocephalic and atraumatic.  Eyes:     General: No scleral icterus. Cardiovascular:     Rate and Rhythm: Normal rate and regular rhythm.  Pulmonary:     Effort: Pulmonary effort is normal. No respiratory distress.     Breath sounds: No wheezing.  Abdominal:     General: There is no distension.     Palpations: Abdomen is soft.  Musculoskeletal:        General: No deformity. Normal range of motion.     Cervical back: Normal range of motion and neck supple.  Skin:    General: Skin is warm and dry.     Findings: No rash.  Neurological:     Mental Status: She is alert and oriented to person, place, and time. Mental status is at baseline.     Cranial Nerves: No cranial nerve deficit.     Coordination: Coordination normal.  Psychiatric:        Mood and Affect: Mood normal.     LABORATORY DATA:  I have reviewed the data as listed     Latest  Ref Rng & Units 04/09/2023    8:55 AM 02/04/2023   10:12 AM 10/08/2022   12:57 PM  CBC  WBC 4.0 - 10.5 K/uL 8.8  7.3  8.4   Hemoglobin 12.0 - 15.0 g/dL 40.9  81.1  91.4   Hematocrit 36.0 - 46.0 % 36.7  38.0  33.4   Platelets 150 - 400 K/uL 229  247.0  228       Latest Ref Rng & Units 04/09/2023    8:55 AM 02/04/2023   10:12 AM 10/08/2022   12:57 PM  CMP  Glucose 70 - 99 mg/dL 782  96  956   BUN 8 - 23 mg/dL 12  17  12    Creatinine 0.44 - 1.00 mg/dL 2.13  0.86  5.78   Sodium 135 - 145 mmol/L 140  142  141   Potassium 3.5 - 5.1 mmol/L 4.1  4.6  3.8   Chloride 98 - 111 mmol/L 107  106  109   CO2 22 - 32 mmol/L 26  28  24    Calcium 8.9 - 10.3 mg/dL 9.2  9.6  8.6   Total Protein 6.5 - 8.1 g/dL 7.1  7.2  6.8   Total Bilirubin 0.3 - 1.2 mg/dL 0.6  0.6  0.2   Alkaline Phos 38 - 126 U/L 53  57  70   AST 15 - 41 U/L 22  19  22    ALT 0 - 44 U/L 19  11  13      Iron/TIBC/Ferritin/ %Sat  Component Value Date/Time   IRON 181 (H) 02/04/2023 1012   IRON 108 08/06/2019 0000   TIBC 385.0 02/04/2023 1012   TIBC 324 08/06/2019 0000   FERRITIN 39.0 02/04/2023 1012   FERRITIN 74 08/06/2019 0000   IRONPCTSAT 47.0 02/04/2023 1012   IRONPCTSAT 33 08/06/2019 0000      RADIOGRAPHIC STUDIES: I have personally reviewed the radiological images as listed and agreed with the findings in the report. LONG TERM MONITOR (3-14 DAYS)  Result Date: 03/25/2023 Patch Wear Time:  13 days and 19 hours (2024-06-26T14:27:49-0400 to 2024-07-10T09:49:01-399) Patient had a min HR of 46 bpm, max HR of 197 bpm, and avg HR of 72 bpm. Predominant underlying rhythm was Sinus Rhythm. 1 run of Ventricular Tachycardia occurred lasting 4 beats with a max rate of 136 bpm (avg 129 bpm). 149 Supraventricular Tachycardia runs occurred, the run with the fastest interval lasting 18 beats with a max rate of 197 bpm, the longest lasting 21.0 secs with an avg rate of 96 bpm. Isolated SVEs were frequent (7.1%, [id number]), SVE Couplets were rare  (<1.0%, 5725), and SVE Triplets were  rare (<1.0%, 935). Isolated VEs were rare (<1.0%, 845), VE Couplets were rare (<1.0%, 6), and VE Triplets were rare (<1.0%, 2). Conclusion Paroxysmal SVT noted, frequent PACs 7.1% burden. SVT associated with patient triggered events. No evidence for atrial fibrillation or atrial flutter.   ECHOCARDIOGRAM COMPLETE  Result Date: 02/25/2023    ECHOCARDIOGRAM REPORT   Patient Name:   [facility] Date of Exam: 02/25/2023 Medical Rec #:  [id number]        Height:       63.0 in Accession #:    [id number]       Weight:       136.0 lb Date of Birth:  1941/04/25        BSA:          1.641 m Patient Age:    81 years         BP:           124/60 mmHg Patient Gender: F                HR:           72 bpm. 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 COPD, Mitral Valve Disease,                 Signs/Symptoms:Dizziness/Lightheadedness; Risk Factors:Former                 Smoker.  Sonographer:    Quentin Ore RDMS, RVT, RDCS Referring Phys: [id number] TABITHA DUGAL IMPRESSIONS  1. Left ventricular ejection fraction, by estimation, is 60 to 65%. The left ventricle has normal function. The left ventricle has no regional wall motion abnormalities. Left ventricular diastolic parameters were normal. The average left ventricular global longitudinal strain is -18.0 %.  2. Right ventricular systolic function is normal. The right ventricular size is normal. There is mildly elevated pulmonary artery systolic pressure. The estimated right ventricular systolic pressure is 36.8 mmHg.  3. The mitral valve is normal in structure. Mild to moderate mitral valve regurgitation. No evidence of mitral stenosis.  4. The  aortic valve is tricuspid. Aortic valve regurgitation is  mild. No aortic stenosis is present.  5. The inferior vena cava is normal in size with greater than 50% respiratory variability, suggesting right atrial pressure of 3 mmHg. FINDINGS  Left Ventricle: Left ventricular ejection fraction, by estimation, is 60 to 65%. The left ventricle has normal function. The left ventricle has no regional wall motion abnormalities. The average left ventricular global longitudinal strain is -18.0 %. The left ventricular internal cavity size was normal in size. There is no left ventricular hypertrophy. Left ventricular diastolic parameters were normal. Right Ventricle: The right ventricular size is normal. No increase in right ventricular wall thickness. Right ventricular systolic function is normal. There is mildly elevated pulmonary artery systolic pressure. The tricuspid regurgitant velocity is 2.82  m/s, and with an assumed right atrial pressure of 5 mmHg, the estimated right ventricular systolic pressure is 36.8 mmHg. Left Atrium: Left atrial size was normal in size. Right Atrium: Right atrial size was normal in size. Pericardium: There is no evidence of pericardial effusion. Mitral Valve: The mitral valve is normal in structure. Mild to moderate mitral valve regurgitation. No evidence of mitral valve stenosis. Tricuspid Valve: The tricuspid valve is normal in structure. Tricuspid valve regurgitation is mild . No evidence of tricuspid stenosis. Aortic Valve: The aortic valve is tricuspid. Aortic valve regurgitation is mild. Aortic regurgitation PHT measures 573 msec. No aortic stenosis is present. Aortic valve mean gradient measures 3.0 mmHg. Aortic valve peak gradient measures 6.4 mmHg. Aortic  valve area, by VTI measures 1.71 cm. Pulmonic Valve: The pulmonic valve was normal in structure. Pulmonic valve regurgitation is not visualized. No evidence of pulmonic stenosis. Aorta: The aortic root is normal in size and structure. Venous: The inferior vena cava is normal in size with  greater than 50% respiratory variability, suggesting right atrial pressure of 3 mmHg. IAS/Shunts: No atrial level shunt detected by color flow Doppler.  LEFT VENTRICLE PLAX 2D LVIDd:         4.40 cm     Diastology LVIDs:         3.10 cm     LV e' medial:    7.94 cm/s LV PW:         0.90 cm     LV E/e' medial:  11.9 LV IVS:        0.90 cm     LV e' lateral:   10.80 cm/s LVOT diam:     1.70 cm     LV E/e' lateral: 8.7 LV SV:         49 LV SV Index:   30          2D Longitudinal Strain LVOT Area:     2.27 cm    2D Strain GLS Avg:     -18.0 %  LV Volumes (MOD) LV vol d, MOD A2C: 68.9 ml 3D Volume EF: LV vol d, MOD A4C: 79.1 ml 3D EF:        57 % LV vol s, MOD A2C: 33.0 ml LV EDV:       102 ml LV vol s, MOD A4C: 36.3 ml LV ESV:       45 ml LV SV MOD A2C:     35.9 ml LV SV:        58 ml LV SV MOD A4C:     79.1 ml LV SV MOD BP:      40.5 ml RIGHT VENTRICLE RV Basal diam:  3.60 cm  RV S prime:     15.80 cm/s TAPSE (M-mode): 2.0 cm LEFT ATRIUM             Index        RIGHT ATRIUM           Index LA diam:        3.80 cm 2.32 cm/m   RA Area:     15.30 cm LA Vol (A2C):   54.7 ml 33.33 ml/m  RA Volume:   35.90 ml  21.87 ml/m LA Vol (A4C):   46.0 ml 28.03 ml/m LA Biplane Vol: 52.6 ml 32.05 ml/m  AORTIC VALVE                    PULMONIC VALVE AV Area (Vmax):    1.78 cm     PV Vmax:       0.99 m/s AV Area (Vmean):   1.74 cm     PV Peak grad:  3.9 mmHg AV Area (VTI):     1.71 cm AV Vmax:           126.00 cm/s AV Vmean:          85.200 cm/s AV VTI:            0.284 m AV Peak Grad:      6.4 mmHg AV Mean Grad:      3.0 mmHg LVOT Vmax:         99.00 cm/s LVOT Vmean:        65.500 cm/s LVOT VTI:          0.214 m LVOT/AV VTI ratio: 0.75 AI PHT:            573 msec  AORTA Ao Root diam: 2.80 cm Ao Asc diam:  2.80 cm Ao Arch diam: 2.4 cm MITRAL VALVE               TRICUSPID VALVE MV Area (PHT): 3.99 cm    TR Peak grad:   31.8 mmHg MV Decel Time: 190 msec    TR Vmax:        282.00 cm/s MV E velocity: 94.30 cm/s MV A velocity: 71.80  cm/s  SHUNTS MV E/A ratio:  1.31        Systemic VTI:  0.21 m                            Systemic Diam: 1.70 cm Julien Nordmann MD Electronically signed by Julien Nordmann MD Signature Date/Time: 02/25/2023/7:23:05 PM    Final

## 2023-04-12 NOTE — Progress Notes (Signed)
Pt here for follow up. No new concerns voiced.   

## 2023-04-12 NOTE — Assessment & Plan Note (Signed)
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  Continue monitor.

## 2023-04-13 ENCOUNTER — Ambulatory Visit: Payer: Medicare HMO

## 2023-04-16 ENCOUNTER — Ambulatory Visit: Payer: Medicare HMO | Admitting: Cardiology

## 2023-04-27 ENCOUNTER — Telehealth: Payer: Self-pay | Admitting: Cardiology

## 2023-04-27 ENCOUNTER — Ambulatory Visit: Payer: Medicare HMO

## 2023-04-27 NOTE — Telephone Encounter (Signed)
Patient would like to the heart monitor results mailed to her.

## 2023-04-27 NOTE — Telephone Encounter (Signed)
Left VM advising patient that requested documents will be mailed to her as requested.

## 2023-04-27 NOTE — Telephone Encounter (Signed)
Patient would like her OV notes and heart monitor results mailed to her.

## 2023-04-30 ENCOUNTER — Ambulatory Visit: Payer: Medicare HMO | Admitting: Cardiology

## 2023-05-25 ENCOUNTER — Other Ambulatory Visit: Payer: Self-pay | Admitting: Family

## 2023-05-25 ENCOUNTER — Telehealth: Payer: Self-pay | Admitting: Family

## 2023-05-25 DIAGNOSIS — Z1231 Encounter for screening mammogram for malignant neoplasm of breast: Secondary | ICD-10-CM

## 2023-05-25 DIAGNOSIS — M8580 Other specified disorders of bone density and structure, unspecified site: Secondary | ICD-10-CM

## 2023-05-25 NOTE — Telephone Encounter (Signed)
Pt called requesting a referral for a bone density sent to [facility]? Pt states she is due for it along with a mammogram. Pt states she was told a referral for the mammogram isn't required, but it is for a bone density. Call back # [phone]

## 2023-05-26 NOTE — Addendum Note (Signed)
Addended by: Mort Sawyers on: 05/26/2023 07:23 AM   Modules accepted: Orders

## 2023-08-09 ENCOUNTER — Ambulatory Visit (INDEPENDENT_AMBULATORY_CARE_PROVIDER_SITE_OTHER): Payer: Medicare HMO | Admitting: Family

## 2023-08-09 ENCOUNTER — Encounter: Payer: Self-pay | Admitting: Family

## 2023-08-09 VITALS — BP 116/68 | HR 79 | Temp 97.9°F | Ht 63.0 in | Wt 134.6 lb

## 2023-08-09 DIAGNOSIS — R21 Rash and other nonspecific skin eruption: Secondary | ICD-10-CM | POA: Diagnosis not present

## 2023-08-09 DIAGNOSIS — R7303 Prediabetes: Secondary | ICD-10-CM

## 2023-08-09 DIAGNOSIS — L304 Erythema intertrigo: Secondary | ICD-10-CM | POA: Diagnosis not present

## 2023-08-09 DIAGNOSIS — B351 Tinea unguium: Secondary | ICD-10-CM

## 2023-08-09 DIAGNOSIS — M21612 Bunion of left foot: Secondary | ICD-10-CM

## 2023-08-09 DIAGNOSIS — R278 Other lack of coordination: Secondary | ICD-10-CM

## 2023-08-09 DIAGNOSIS — I340 Nonrheumatic mitral (valve) insufficiency: Secondary | ICD-10-CM

## 2023-08-09 LAB — POCT GLYCOSYLATED HEMOGLOBIN (HGB A1C): Hemoglobin A1C: 6 % — AB (ref 4.0–5.6)

## 2023-08-09 MED ORDER — KETOCONAZOLE 2 % EX CREA
1.0000 | TOPICAL_CREAM | Freq: Every day | CUTANEOUS | 0 refills | Status: DC
Start: 2023-08-09 — End: 2024-07-18

## 2023-08-09 MED ORDER — CICLOPIROX 8 % EX SOLN: Freq: Every day | CUTANEOUS | 0 refills | Status: DC

## 2023-08-09 NOTE — Progress Notes (Signed)
Established Patient Office Visit  Subjective:      CC:  Chief Complaint  Patient presents with   Medical Management of Chronic Issues    HPI: Roberta Curtis is a 82 y.o. female presenting on 08/09/2023 for Medical Management of Chronic Issues . 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 Was placed on cardiac monitor to r/o arrhythmia, was advised to f/u x two months  however looks like she did not. 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    New complaints:   Left under breast rash about a few weeks ago, burning tingling and itching now. Painful upon touch. Extended to the left lateral rib, decreasing in size. Did go through a blister phase as well. She states has applied corn starch with no relief. Pats it dry daily with wash cloth. Worse with lying down on her back.  Requesting referral to podiatry. She is noticing that her left great toe is 'turning in' she also has a toenail fungus on her right great toe. She tried some over the counter fungal nail solution without much relief.   Constipation: started daily miralax and had improvement however now states daily bowel movement and up to 3 x one day last week. She also takes daily benefiber. She states she stopped miralax and fiber pill yesterday. Denies abd pain. No n/v. Colonoscopy , with polyps , no longer surveillance recommended due to GI for colonoscopy due to age. Last seen by Gi 10/29/22    Relevant past medical, surgical, family and social history reviewed and updated as indicated. Interim medical history since our last visit reviewed.  Allergies and medications reviewed and updated.  DATA REVIEWED: CHART IN EPIC     ROS:  Negative unless specifically indicated above in HPI.    Current Outpatient Medications:    albuterol (VENTOLIN HFA) 108 (90 Base) MCG/ACT inhaler, Inhale 1-2 puffs into the lungs as needed for wheezing or shortness of breath., Disp: 6.7 g, Rfl: 1   aspirin EC 81 MG tablet, Take 81 mg by mouth daily. Swallow whole., Disp: , Rfl:    Biotin 1 MG CAPS, Take 1,000 mcg by mouth daily. , Disp: , Rfl:    calcium carbonate (OS-CAL - DOSED IN MG OF ELEMENTAL CALCIUM) 1250 (500 Ca) MG tablet, Take 1 tablet by mouth daily., Disp: , Rfl:    Cholecalciferol (VITAMIN D3) 50 MCG (2000 UT) CAPS, Take 4,000 Units by mouth daily., Disp: , Rfl:    ciclopirox (PENLAC) 8 % solution, Apply topically at bedtime. Apply over nail and surrounding skin. Apply daily over previous coat. After seven (7) days, may remove with alcohol and continue cycle., Disp: 6.6 mL, Rfl: 0   cyanocobalamin (VITAMIN B12) 500 MCG tablet, Take 500 mcg by mouth daily., Disp: , Rfl:    esomeprazole (NEXIUM) 20 MG capsule, TAKE 1 CAPSULE (20 MG TOTAL) BY MOUTH DAILY AT 12 NOON., Disp: 90 capsule, Rfl: 1   ibandronate (BONIVA) 150 MG tablet, TAKE 1 TABLET (150 MG TOTAL) BY MOUTH EVERY 30 (THIRTY) DAYS., Disp: 3 tablet, Rfl: 1   ketoconazole (NIZORAL) 2 % cream, Apply 1 Application topically daily., Disp: 15 g, Rfl: 0   magnesium oxide (MAG-OX) 400 (240 Mg)  MG tablet, Take 400 mg by mouth daily., Disp: , Rfl:    meclizine (ANTIVERT) 12.5 MG tablet, TAKE 1 TABLET BY MOUTH 3 TIMES DAILY AS NEEDED., Disp: 30 tablet, Rfl: 0   Potassium 99 MG TABS, Take 1 tablet by mouth daily., Disp: , Rfl:    vitamin C (ASCORBIC ACID) 250 MG tablet, Take 250 mg by mouth daily., Disp: , Rfl:       Objective:    BP 116/68 (BP Location: Left Arm, Patient Position: Sitting, Cuff Size: Normal)   Pulse 79   Temp 97.9 F (36.6 C) (Temporal)   Ht 5\' 3"  (1.6 m)   Wt 134 lb 9.6 oz (61.1 kg)   SpO2 100%   BMI 23.84 kg/m   Wt Readings from Last 3 Encounters:  08/09/23  134 lb 9.6 oz (61.1 kg)  04/12/23 134 lb 9.6 oz (61.1 kg)  02/26/23 137 lb (62.1 kg)    Physical Exam Constitutional:      General: She is not in acute distress.    Appearance: Normal appearance. She is normal weight. She is not ill-appearing, toxic-appearing or diaphoretic.  HENT:     Head: Normocephalic.  Cardiovascular:     Rate and Rhythm: Normal rate and regular rhythm.  Pulmonary:     Effort: Pulmonary effort is normal.     Breath sounds: Normal breath sounds.  Abdominal:     General: Bowel sounds are normal.     Tenderness: There is no abdominal tenderness. There is no guarding or rebound.  Musculoskeletal:        General: Normal range of motion.  Skin:    Comments: Papular vesicular rash extending from left lateral rib cage to directly below breast, tender on palpation   Neurological:     General: No focal deficit present.     Mental Status: She is alert and oriented to person, place, and time. Mental status is at baseline.  Psychiatric:        Mood and Affect: Mood normal.        Behavior: Behavior normal.        Thought Content: Thought content normal.        Judgment: Judgment normal.           Assessment & Plan:  Fungal toenail infection Assessment & Plan: Trial penlac solution if no improvement consider oral   Orders: -     Ciclopirox; Apply topically at bedtime. Apply over nail and surrounding skin. Apply daily over previous coat. After seven (7) days, may remove with alcohol and continue cycle.  Dispense: 6.6 mL; Refill: 0  Prediabetes -     POCT glycosylated hemoglobin (Hb A1C)  Intertrigo -     Ketoconazole; Apply 1 Application topically daily.  Dispense: 15 g; Refill: 0  Rash Assessment & Plan: Shingles vs intertrigo although high suspicion shingles Past 72 hour period for valtrex start pain improving.  Will also give ketoconazole cream as very low suspicion for intertrigo     Nonrheumatic mitral valve regurgitation Assessment &  Plan: Overdue for f/u with cardiology  Pt aware she needs to schedule f/u   Truncal ataxia  Bunion, left foot Assessment & Plan: Worsening however nontender.  Handout given for pt on care.  Requesting referral for podiatry   Orders: -     Ambulatory referral to Podiatry     Return in about 6 months (around 02/07/2024) for f/u CPE.  Mort Sawyers, MSN, APRN, FNP-C [facility]

## 2023-08-09 NOTE — Assessment & Plan Note (Signed)
Shingles vs intertrigo although high suspicion shingles Past 72 hour period for valtrex start pain improving.  Will also give ketoconazole cream as very low suspicion for intertrigo

## 2023-08-09 NOTE — Assessment & Plan Note (Signed)
Trial penlac solution if no improvement consider oral

## 2023-08-09 NOTE — Assessment & Plan Note (Signed)
Worsening however nontender.  Handout given for pt on care.  Requesting referral for podiatry

## 2023-08-09 NOTE — Patient Instructions (Addendum)
  Schedule follow up appointment with cardiology, overdue.   Go down to half capful of miralax and if no improvement see your stomach doc.  Continue benefiber.  Drink lots of water!  A referral was placed today for podiatry. Please let us know if you have not heard back within 2 weeks about the referral.

## 2023-08-09 NOTE — Assessment & Plan Note (Signed)
Overdue for f/u with cardiology  Pt aware she needs to schedule f/u

## 2023-08-11 ENCOUNTER — Ambulatory Visit: Payer: Medicare HMO | Admitting: Dermatology

## 2023-08-11 DIAGNOSIS — B379 Candidiasis, unspecified: Secondary | ICD-10-CM

## 2023-08-11 DIAGNOSIS — L304 Erythema intertrigo: Secondary | ICD-10-CM | POA: Diagnosis not present

## 2023-08-11 MED ORDER — KETOCONAZOLE 2 % EX CREA
TOPICAL_CREAM | CUTANEOUS | 3 refills | Status: DC
Start: 2023-08-11 — End: 2023-08-30

## 2023-08-11 MED ORDER — FLUCONAZOLE 200 MG PO TABS
200.0000 mg | ORAL_TABLET | Freq: Every day | ORAL | 0 refills | Status: AC
Start: 2023-08-11 — End: 2023-08-16

## 2023-08-11 NOTE — Progress Notes (Signed)
   Follow-Up Visit   Subjective  Roberta Curtis is a 82 y.o. female who presents for the following:  Rash under breast 2 weeks ago, has been using cornstarch powder, hydrocortisone. Very sore and itchy.    The patient has spots, moles and lesions to be evaluated, some may be new or changing and the patient may have concern these could be cancer.   The following portions of the chart were reviewed this encounter and updated as appropriate: medications, allergies, medical history  Review of Systems:  No other skin or systemic complaints except as noted in HPI or Assessment and Plan.  Objective  Well appearing patient in no apparent distress; mood and affect are within normal limits.   A focused examination was performed of the following areas: B/l inframammary   Relevant exam findings are noted in the Assessment and Plan.    Assessment & Plan   INTERTRIGO Candida Exam Bright erythematous moist patch with satelite papules at left inframammary  Chronic and persistent condition with duration or expected duration over one year. Condition is bothersome/symptomatic for patient. Currently flared.   Intertrigo is a chronic recurrent rash that occurs in skin fold areas that may be associated with friction; heat; moisture; yeast; fungus; and bacteria.  It is exacerbated by increased movement / activity; sweating; and higher atmospheric temperature.  Treatment Plan Start fluconazole 200 mg tablet - take 1 by mouth daily for 5 days. Start ketoconazole cream - apply topically to rash twice daily for 2 - 4 weeks. Once clear continue applying cream for 1 week after   Can continue using over the counter hydrocortisone cream - as needed for itch  Stop cornstarch  Recommend OTC Zeasorb AF powder to body folds daily after shower.  It is often found in the athlete's foot section in the pharmacy.  Avoid using powders that contain cornstarch.   Erythema intertrigo  Related  Medications fluconazole (DIFLUCAN) 200 MG tablet Take 1 tablet (200 mg total) by mouth daily for 5 days.  ketoconazole (NIZORAL) 2 % cream Apply topically to affected rash under left breast twice daily    Return if symptoms worsen or fail to improve, for 1 month follow up on rash .  I, Asher Muir, CMA, am acting as scribe for Willeen Niece, MD.   Documentation: I have reviewed the above documentation for accuracy and completeness, and I agree with the above.  Willeen Niece, MD

## 2023-08-11 NOTE — Patient Instructions (Addendum)
Intertrigo is a chronic recurrent rash that occurs in skin fold areas that may be associated with friction; heat; moisture; yeast; fungus; and bacteria.  It is exacerbated by increased movement / activity; sweating; and higher atmospheric temperature.   Start ketoconazole cream - apply topically to rash twice daily for 2 - 4 weeks. Once clear continue applying cream for 1 week after   Can continue using over the counter hydrocortisone cream - as needed for itch   Start fluconazole 200 mg tablet - take 1 by mouth daily for 5 days.   Recommend OTC Zeasorb AF powder to body folds daily after shower.  It is often found in the athlete's foot section in the pharmacy.  Avoid using powders that contain cornstarch.    Due to recent changes in healthcare laws, you may see results of your pathology and/or laboratory studies on MyChart before the doctors have had a chance to review them. We understand that in some cases there may be results that are confusing or concerning to you. Please understand that not all results are received at the same time and often the doctors may need to interpret multiple results in order to provide you with the best plan of care or course of treatment. Therefore, we ask that you please give [place] 2 business days to thoroughly review all your results before contacting the office for clarification. Should we see a critical lab result, you will be contacted sooner.   If You Need Anything After Your Visit  If you have any questions or concerns for your doctor, please call our main line at [phone] and press option 4 to reach your doctor's medical assistant. If no one answers, please leave a voicemail as directed and we will return your call as soon as possible. Messages left after 4 pm will be answered the following business day.   You may also send [place] a message via MyChart. We typically respond to MyChart messages within 1-2 business days.  For prescription refills, please ask  your pharmacy to contact our office. Our fax number is [phone].  If you have an urgent issue when the clinic is closed that cannot wait until the next business day, you can page your doctor at the number below.    Please note that while we do our best to be available for urgent issues outside of office hours, we are not available 24/7.   If you have an urgent issue and are unable to reach [place], you may choose to seek medical care at your doctor's office, retail clinic, urgent care center, or emergency room.  If you have a medical emergency, please immediately call 911 or go to the emergency department.  Pager Numbers  - Dr. Gwen Pounds: [phone]  - Dr. Roseanne Reno: [phone]  - Dr. Katrinka Blazing: [phone]   In the event of inclement weather, please call our main line at [phone] for an update on the status of any delays or closures.  Dermatology Medication Tips: Please keep the boxes that topical medications come in in order to help keep track of the instructions about where and how to use these. Pharmacies typically print the medication instructions only on the boxes and not directly on the medication tubes.   If your medication is too expensive, please contact our office at [phone] option 4 or send [place] a message through MyChart.   We are unable to tell what your co-pay for medications will be in advance as this is different depending on your insurance coverage.  However, we may be able to find a substitute medication at lower cost or fill out paperwork to get insurance to cover a needed medication.   If a prior authorization is required to get your medication covered by your insurance company, please allow [place] 1-2 business days to complete this process.  Drug prices often vary depending on where the prescription is filled and some pharmacies may offer cheaper prices.  The website www.goodrx.com contains coupons for medications through different pharmacies. The prices here do not  account for what the cost may be with help from insurance (it may be cheaper with your insurance), but the website can give you the price if you did not use any insurance.  - You can print the associated coupon and take it with your prescription to the pharmacy.  - You may also stop by our office during regular business hours and pick up a GoodRx coupon card.  - If you need your prescription sent electronically to a different pharmacy, notify our office through [facility] or by phone at [phone] option 4.     Si Usted Necesita Algo Despus de Su Visita  Tambin puede enviarnos un mensaje a travs de Clinical cytogeneticist. Por lo general respondemos a los mensajes de MyChart en el transcurso de 1 a 2 das hbiles.  Para renovar recetas, por favor pida a su farmacia que se ponga en contacto con nuestra oficina. Annie Sable de fax es [place] [phone].  Si tiene un asunto urgente cuando la clnica est cerrada y que no puede esperar hasta el siguiente da hbil, puede llamar/localizar a su doctor(a) al nmero que aparece a continuacin.   Por favor, tenga en cuenta que aunque hacemos todo lo posible para estar disponibles para asuntos urgentes fuera del horario de [place], no estamos disponibles las 24 horas del da, los 7 [address]  [address] de la [place].   Si tiene un problema urgente y no puede comunicarse con nosotros, puede optar por buscar atencin mdica  en el consultorio de su doctor(a), en una clnica privada, en un centro de atencin urgente o en una sala de emergencias.  Si tiene Engineer, drilling, por favor llame inmediatamente al 911 o vaya a la sala de emergencias.  Nmeros de bper  - Dr. Gwen Pounds: [phone]  - Dra. Roseanne Reno: [phone]  - Dr. Katrinka Blazing: [phone]   En caso de inclemencias del tiempo, por favor llame a Lacy Duverney principal al [phone] para una actualizacin sobre el [place] de cualquier retraso o cierre.  Consejos para la medicacin en dermatologa: Por  favor, guarde las cajas en las que vienen los medicamentos de uso tpico para ayudarle a seguir las instrucciones sobre dnde y cmo usarlos. Las farmacias generalmente imprimen las instrucciones del medicamento slo en las cajas y no directamente en los tubos del [place].   Si su medicamento es muy caro, por favor, pngase en contacto con Rolm Gala llamando al [phone] y presione la opcin 4 o envenos un mensaje a travs de Clinical cytogeneticist.   No podemos decirle cul ser su copago por los medicamentos por adelantado ya que esto es diferente dependiendo de la cobertura de su seguro. Sin embargo, es posible que podamos encontrar un medicamento sustituto a Audiological scientist un formulario para que el seguro cubra el medicamento que se considera necesario.   Si se requiere una autorizacin previa para que su compaa de seguros [place] su medicamento, por favor permtanos de 1 a 2 das hbiles para completar [address]  Los precios  de los medicamentos varan con frecuencia dependiendo del lugar de dnde se surte la receta y alguna farmacias pueden ofrecer precios ms baratos.  El sitio web www.goodrx.com tiene cupones para medicamentos de Health and safety inspector. Los precios aqu no tienen en cuenta lo que podra costar con la ayuda del seguro (puede ser ms barato con su seguro), pero el sitio web puede darle el precio si no utiliz Tourist information centre manager.  - Puede imprimir el cupn correspondiente y llevarlo con su receta a la farmacia.  - Tambin puede pasar por nuestra oficina durante el horario de atencin regular y Education officer, museum una tarjeta de cupones de GoodRx.  - Si necesita que su receta se enve electrnicamente a una farmacia diferente, informe a nuestra oficina a travs de MyChart de [place] o por telfono llamando al [phone] y presione la opcin 4.

## 2023-08-12 ENCOUNTER — Telehealth: Payer: Self-pay | Admitting: Family

## 2023-08-12 ENCOUNTER — Ambulatory Visit
Admission: RE | Admit: 2023-08-12 | Discharge: 2023-08-12 | Disposition: A | Discharge status: Home / Self Care | Payer: Medicare HMO | Source: Ambulatory Visit | Attending: Family | Admitting: Family

## 2023-08-12 DIAGNOSIS — Z9221 Personal history of antineoplastic chemotherapy: Secondary | ICD-10-CM | POA: Insufficient documentation

## 2023-08-12 DIAGNOSIS — J45909 Unspecified asthma, uncomplicated: Secondary | ICD-10-CM | POA: Insufficient documentation

## 2023-08-12 DIAGNOSIS — N289 Disorder of kidney and ureter, unspecified: Secondary | ICD-10-CM | POA: Insufficient documentation

## 2023-08-12 DIAGNOSIS — Z780 Asymptomatic menopausal state: Secondary | ICD-10-CM | POA: Diagnosis not present

## 2023-08-12 DIAGNOSIS — Z13820 Encounter for screening for osteoporosis: Secondary | ICD-10-CM | POA: Insufficient documentation

## 2023-08-12 DIAGNOSIS — M8580 Other specified disorders of bone density and structure, unspecified site: Secondary | ICD-10-CM

## 2023-08-12 DIAGNOSIS — M810 Age-related osteoporosis without current pathological fracture: Secondary | ICD-10-CM | POA: Insufficient documentation

## 2023-08-12 DIAGNOSIS — B351 Tinea unguium: Secondary | ICD-10-CM

## 2023-08-12 MED ORDER — CICLOPIROX 8 % EX SOLN: Freq: Every day | CUTANEOUS | 0 refills | Status: DC

## 2023-08-12 NOTE — Telephone Encounter (Signed)
Patient asked if medication ciclopirox (PENLAC) 8 % solution  could be re ordered? Patient believes she may have accidentally cancelled it and pharmacy no longer has it. Please advise, can go to same pharmacy. Patent also anted to advise Tabitha that the rash she was treating her for under her L breast is actually a yeast infection- confirmed by her dermatologist.

## 2023-08-12 NOTE — Telephone Encounter (Signed)
Rx has been sent in as requested and pt is aware. Will route message to Tabitha to make her aware of the other information.

## 2023-08-13 MED ORDER — CICLOPIROX 8 % EX SOLN: Freq: Every day | CUTANEOUS | 0 refills | Status: DC

## 2023-08-13 NOTE — Addendum Note (Signed)
Addended by: Mort Sawyers on: 08/13/2023 11:40 AM   Modules accepted: Orders

## 2023-08-13 NOTE — Telephone Encounter (Signed)
Noted  

## 2023-08-15 ENCOUNTER — Encounter: Payer: Self-pay | Admitting: Family

## 2023-08-15 DIAGNOSIS — M810 Age-related osteoporosis without current pathological fracture: Secondary | ICD-10-CM | POA: Insufficient documentation

## 2023-08-24 ENCOUNTER — Telehealth: Payer: Self-pay

## 2023-08-24 ENCOUNTER — Encounter: Payer: Self-pay | Admitting: Family

## 2023-08-24 ENCOUNTER — Telehealth: Payer: Self-pay | Admitting: Family

## 2023-08-24 ENCOUNTER — Ambulatory Visit: Payer: Medicare HMO | Admitting: Family

## 2023-08-24 VITALS — BP 112/70 | HR 85 | Temp 97.9°F | Ht 63.0 in | Wt 131.0 lb

## 2023-08-24 DIAGNOSIS — R197 Diarrhea, unspecified: Secondary | ICD-10-CM

## 2023-08-24 DIAGNOSIS — R110 Nausea: Secondary | ICD-10-CM | POA: Diagnosis not present

## 2023-08-24 DIAGNOSIS — R1031 Right lower quadrant pain: Secondary | ICD-10-CM | POA: Diagnosis not present

## 2023-08-24 DIAGNOSIS — R194 Change in bowel habit: Secondary | ICD-10-CM

## 2023-08-24 DIAGNOSIS — N3001 Acute cystitis with hematuria: Secondary | ICD-10-CM

## 2023-08-24 DIAGNOSIS — R1013 Epigastric pain: Secondary | ICD-10-CM | POA: Diagnosis not present

## 2023-08-24 DIAGNOSIS — R531 Weakness: Secondary | ICD-10-CM

## 2023-08-24 DIAGNOSIS — K219 Gastro-esophageal reflux disease without esophagitis: Secondary | ICD-10-CM

## 2023-08-24 LAB — CBC WITH DIFFERENTIAL/PLATELET
Basophils Absolute: 0 10*3/uL (ref 0.0–0.1)
Basophils Relative: 0.6 % (ref 0.0–3.0)
Eosinophils Absolute: 0.1 10*3/uL (ref 0.0–0.7)
Eosinophils Relative: 1.7 % (ref 0.0–5.0)
HCT: 38.1 % (ref 36.0–46.0)
Hemoglobin: 12.1 g/dL (ref 12.0–15.0)
Lymphocytes Relative: 29.2 % (ref 12.0–46.0)
Lymphs Abs: 2.2 10*3/uL (ref 0.7–4.0)
MCHC: 31.8 g/dL (ref 30.0–36.0)
MCV: 75.8 fL — ABNORMAL LOW (ref 78.0–100.0)
Monocytes Absolute: 0.7 10*3/uL (ref 0.1–1.0)
Monocytes Relative: 9.6 % (ref 3.0–12.0)
Neutro Abs: 4.4 10*3/uL (ref 1.4–7.7)
Neutrophils Relative %: 58.9 % (ref 43.0–77.0)
Platelets: 246 10*3/uL (ref 150.0–400.0)
RBC: 5.03 Mil/uL (ref 3.87–5.11)
RDW: 14.9 % (ref 11.5–15.5)
WBC: 7.4 10*3/uL (ref 4.0–10.5)

## 2023-08-24 LAB — POCT URINALYSIS DIP (CLINITEK)
Glucose, UA: NEGATIVE mg/dL
Nitrite, UA: NEGATIVE
Spec Grav, UA: 1.025 (ref 1.010–1.025)
Urobilinogen, UA: 0.2 U/dL
pH, UA: 5.5 (ref 5.0–8.0)

## 2023-08-24 LAB — COMPREHENSIVE METABOLIC PANEL
ALT: 14 U/L (ref 0–35)
AST: 22 U/L (ref 0–37)
Albumin: 3.9 g/dL (ref 3.5–5.2)
Alkaline Phosphatase: 60 U/L (ref 39–117)
BUN: 12 mg/dL (ref 6–23)
CO2: 26 meq/L (ref 19–32)
Calcium: 8.8 mg/dL (ref 8.4–10.5)
Chloride: 106 meq/L (ref 96–112)
Creatinine, Ser: 0.86 mg/dL (ref 0.40–1.20)
GFR: 63.34 mL/min (ref >60.00)
Glucose, Bld: 118 mg/dL — ABNORMAL HIGH (ref 70–99)
Potassium: 3.1 meq/L — ABNORMAL LOW (ref 3.5–5.1)
Sodium: 142 meq/L (ref 135–145)
Total Bilirubin: 0.4 mg/dL (ref 0.2–1.2)
Total Protein: 6.9 g/dL (ref 6.0–8.3)

## 2023-08-24 LAB — POC COVID19 BINAXNOW: SARS Coronavirus 2 Ag: NEGATIVE

## 2023-08-24 LAB — FECAL OCCULT BLOOD, IMMUNOCHEMICAL: Fecal Occult Bld: POSITIVE — AB

## 2023-08-24 LAB — POCT INFLUENZA A/B: Influenza A, POC: NEGATIVE

## 2023-08-24 MED ORDER — CEPHALEXIN 500 MG PO CAPS: 500.0000 mg | ORAL_CAPSULE | Freq: Two times a day (BID) | ORAL | 0 refills | Status: AC

## 2023-08-24 MED ORDER — CEPHALEXIN 500 MG PO CAPS: 500.0000 mg | ORAL_CAPSULE | Freq: Three times a day (TID) | ORAL | 0 refills | Status: DC

## 2023-08-24 MED ORDER — ESOMEPRAZOLE MAGNESIUM 20 MG PO CPDR: 20.0000 mg | DELAYED_RELEASE_CAPSULE | Freq: Every day | ORAL | 3 refills | Status: DC

## 2023-08-24 NOTE — Assessment & Plan Note (Signed)
Flu and covid negative.  Suspected gastroenteritis Could be dehydration contributing, although vitals stable. Advised to continue to push oral fluids and eat as tolerated. Advised of red flag symptoms. Cbc/cmp ordered pending results.  R/o UTI urine poc ordered.

## 2023-08-24 NOTE — Assessment & Plan Note (Signed)
Suspected UTI  With symptoms alongside and weakness going to treat instead of wait.  Sent for culture pending results.

## 2023-08-24 NOTE — Telephone Encounter (Signed)
Aware If any visible blood in stool or severe abdominal pain - go to ER Otherwise will pend response from pcp   Will cc myself to monitor as well

## 2023-08-24 NOTE — Assessment & Plan Note (Signed)
Ddx diverticulitis vs appendicitis vs gas Stat cbc and cmp  Discussed CT abd pelvis pt declines for now. Advised of red flag symptoms however for any of the blood and ER if needed.

## 2023-08-24 NOTE — Assessment & Plan Note (Signed)
Start nexium 

## 2023-08-24 NOTE — Telephone Encounter (Signed)
Received call from [facility] relaying pos iFOB result for pt.   Will fwd to Roberta Curtis and Roberta Curtis is out of the office).

## 2023-08-24 NOTE — Telephone Encounter (Signed)
Pt's daughter called back returning Lindsay's call. pt's daughter had no questions/concerns. Call back # [phone]

## 2023-08-24 NOTE — Telephone Encounter (Signed)
Called pt and advised her of results. Pt hasn't had any visible blood or abd pain. ER precautions given and she will await a call back from PCP tomorrow

## 2023-08-24 NOTE — Patient Instructions (Addendum)
  Stop magnesium for now.  Take over the counter immodium instead of pepto bismal.  Continue to stay off of miralax and benefiber for now.  Maintain taking water as much as you can throughout the day.  Restart the nexium this would help with nausea and or the pain in the center of your chest area.   If you experience any sudden shortness of breath, worsening nausea or diarrhea, and or abdominal pain go to the ER immediately.   PLEASE CALL GASTROENTEROLOGIST TO SCHEDULE FOLLOW UP FOR ONGOING CONSTIPATION/DIARRHEA

## 2023-08-24 NOTE — Progress Notes (Signed)
Established Patient Office Visit  Subjective:   Patient ID: Roberta Curtis, female    DOB: 10-02-1940  Age: 82 y.o. MRN: [id number]  CC:  Chief Complaint  Patient presents with   Diarrhea    HPI: Roberta Curtis is a 82 y.o. female presenting on 08/24/2023 for Diarrhea   Last monday has not been feeling well. She started with diarrhea, going about every hour on the first day of symptoms.   Last visit with constipation, was taking daily miralax however diarrhea was starting to increase so she went to 1/2 miralax once daily and taking daily benefiber. She is having cold sweats, feeling hot, and feeling weak. No abdominal pain. Diarrhea not since yesterday, only slight water this am. Last episode of diarrhea was 5 pm last night. She would feel it at the time bubbling up and then would follow w the diarrhea. Has stopped miralax and benefiber from the last one week.  Taking pepto bismal which makes her stools back and taking tums which is relieving the nausea. She has not vomited.  She has been drinking water regularly throughout the day. Ate for the first time yesterday, otherwise did not eat much throughout the weekend. Does note increased   Denies congestion or sore throat or cough.  Pt has not taking her nexium In the last three days.  Denies any urinary symptoms.  No blood seen in the stool.    Wt Readings from Last 3 Encounters:  08/24/23 131 lb (59.4 kg)  08/09/23 134 lb 9.6 oz (61.1 kg)  04/12/23 134 lb 9.6 oz (61.1 kg)   Temp Readings from Last 3 Encounters:  08/24/23 97.9 F (36.6 C) (Temporal)  08/09/23 97.9 F (36.6 C) (Temporal)  04/12/23 97.6 F (36.4 C)   BP Readings from Last 3 Encounters:  08/24/23 112/70  08/09/23 116/68  04/12/23 132/82   Pulse Readings from Last 3 Encounters:  08/24/23 85  08/09/23 79  04/12/23 66        ROS: Negative unless specifically indicated above in HPI.   Relevant past medical history reviewed and updated as  indicated.   Allergies and medications reviewed and updated.   Current Outpatient Medications:    albuterol (VENTOLIN HFA) 108 (90 Base) MCG/ACT inhaler, Inhale 1-2 puffs into the lungs as needed for wheezing or shortness of breath., Disp: 6.7 g, Rfl: 1   aspirin EC 81 MG tablet, Take 81 mg by mouth daily. Swallow whole., Disp: , Rfl:    Biotin 1 MG CAPS, Take 1,000 mcg by mouth daily. , Disp: , Rfl:    calcium carbonate (OS-CAL - DOSED IN MG OF ELEMENTAL CALCIUM) 1250 (500 Ca) MG tablet, Take 1 tablet by mouth daily., Disp: , Rfl:    Cholecalciferol (VITAMIN D3) 50 MCG (2000 UT) CAPS, Take 4,000 Units by mouth daily., Disp: , Rfl:    ciclopirox (PENLAC) 8 % solution, Apply topically at bedtime. Apply over nail and surrounding skin. Apply daily over previous coat. After seven (7) days, may remove with alcohol and continue cycle., Disp: 6.6 mL, Rfl: 0   cyanocobalamin (VITAMIN B12) 500 MCG tablet, Take 500 mcg by mouth daily., Disp: , Rfl:    ibandronate (BONIVA) 150 MG tablet, TAKE 1 TABLET (150 MG TOTAL) BY MOUTH EVERY 30 (THIRTY) DAYS., Disp: 3 tablet, Rfl: 1   ketoconazole (NIZORAL) 2 % cream, Apply 1 Application topically daily., Disp: 15 g, Rfl: 0   ketoconazole (NIZORAL) 2 % cream, Apply topically to affected rash under  left breast twice daily, Disp: 60 g, Rfl: 3   magnesium oxide (MAG-OX) 400 (240 Mg) MG tablet, Take 400 mg by mouth daily., Disp: , Rfl:    meclizine (ANTIVERT) 12.5 MG tablet, TAKE 1 TABLET BY MOUTH 3 TIMES DAILY AS NEEDED., Disp: 30 tablet, Rfl: 0   Potassium 99 MG TABS, Take 1 tablet by mouth daily., Disp: , Rfl:    vitamin C (ASCORBIC ACID) 250 MG tablet, Take 250 mg by mouth daily., Disp: , Rfl:    esomeprazole (NEXIUM) 20 MG capsule, Take 1 capsule (20 mg total) by mouth daily at 12 noon., Disp: 90 capsule, Rfl: 3  Allergies  Allergen Reactions   Jadenu [Deferasirox] Other (See Comments)    Elevated Iron, headache and rash   Ketotifen Fumarate    Levofloxacin  Other (See Comments)   Ondansetron Hcl Other (See Comments)   Codeine Palpitations   Oxycodone-Acetaminophen Palpitations and Other (See Comments)   Penicillins Rash    Objective:   BP 112/70   Pulse 85   Temp 97.9 F (36.6 C) (Temporal)   Ht 5\' 3"  (1.6 m)   Wt 131 lb (59.4 kg)   SpO2 96%   BMI 23.21 kg/m    Physical Exam Abdominal:     General: Bowel sounds are decreased.     Palpations: Abdomen is soft.     Tenderness: There is abdominal tenderness in the right lower quadrant and epigastric area. There is no rebound. Negative signs include Rovsing's sign, McBurney's sign and psoas sign.   Pain rlq abdomen with 5/10 pain   Assessment & Plan:  Diarrhea, unspecified type -     POCT Influenza A/B -     POC COVID-19 BinaxNow -     Giardia antigen -     C. difficile GDH and Toxin A/B -     Gastrointestinal Pathogen Pnl RT, PCR -     CBC with Differential/Platelet -     Fecal occult blood, imunochemical  Nausea -     POCT Influenza A/B -     POC COVID-19 BinaxNow  Epigastric pain Assessment & Plan: Start nexium    Orders: -     CBC with Differential/Platelet -     Esomeprazole Magnesium; Take 1 capsule (20 mg total) by mouth daily at 12 noon.  Dispense: 90 capsule; Refill: 3  Right lower quadrant abdominal pain Assessment & Plan: Ddx diverticulitis vs appendicitis vs gas Stat cbc and cmp  Discussed CT abd pelvis pt declines for now. Advised of red flag symptoms however for any of the blood and ER if needed.   Orders: -     CBC with Differential/Platelet -     POCT URINALYSIS DIP (CLINITEK) -     Comprehensive metabolic panel -     Fecal occult blood, imunochemical  Gastroesophageal reflux disease, unspecified whether esophagitis present -     Esomeprazole Magnesium; Take 1 capsule (20 mg total) by mouth daily at 12 noon.  Dispense: 90 capsule; Refill: 3  Generalized weakness Assessment & Plan: Flu and covid negative.  Suspected gastroenteritis Could be  dehydration contributing, although vitals stable. Advised to continue to push oral fluids and eat as tolerated. Advised of red flag symptoms. Cbc/cmp ordered pending results.  R/o UTI urine poc ordered.    Change in bowel habits Assessment & Plan: Now with diarrhea,  Possibly infectious ordering stool cultures and fobt for weakness, r/o bleeding.  Immodium recommended at this time.  Follow up plan: Return in about 1 week (around 08/31/2023) for follow up diarrhea.  Mort Sawyers, FNP

## 2023-08-24 NOTE — Assessment & Plan Note (Signed)
Now with diarrhea,  Possibly infectious ordering stool cultures and fobt for weakness, r/o bleeding.  Immodium recommended at this time.

## 2023-08-24 NOTE — Telephone Encounter (Signed)
Mort Sawyers, FNP  P Dugal Pool Please call pt to let her know urine was a bit suggestive of a UTI which could be a reason as well why she is feeling so lousy I am sending in an antiobtic and also sending in urine culture to see if we need to change the med. But get started on this for now. -------------------------------------------------------- LM for pt to return call.

## 2023-08-25 ENCOUNTER — Ambulatory Visit: Payer: Medicare HMO | Admitting: Podiatry

## 2023-08-25 LAB — C. DIFFICILE GDH AND TOXIN A/B
GDH ANTIGEN: NOT DETECTED
MICRO NUMBER:: 15860708
SPECIMEN QUALITY:: ADEQUATE
TOXIN A AND B: NOT DETECTED

## 2023-08-25 MED ORDER — POTASSIUM CHLORIDE CRYS ER 20 MEQ PO TBCR: 10.0000 meq | EXTENDED_RELEASE_TABLET | Freq: Two times a day (BID) | ORAL | 0 refills | Status: DC

## 2023-08-25 NOTE — Addendum Note (Signed)
Addended by: Jaynee Eagles C on: 08/25/2023 12:25 PM   Modules accepted: Orders

## 2023-08-25 NOTE — Progress Notes (Signed)
Discussed in another note. Please refer.

## 2023-08-25 NOTE — Telephone Encounter (Signed)
Thank you for calling, and following up with patient.  CBC reviewed, anemia is stable if not improved.  Please have pt call Dr. Wyline Mood, her GI to follow up on this. Any history of hemorrhoids? I suspect this is the possible reason.   Advise her if any worsening fatigue, start of sob and or chest pain, and or just overall feeling worse or seeing blood in stool go to the ER.   Potassium is a bit low, Sending in RX for potassium chloride 10 meq twice daily for 7 days then have pt follow up next week in office to f/u on symptoms and to repeat potassium.   Make sure pt received and is aware of antbx I sent in for suspected UTI based off of urine in office.

## 2023-08-25 NOTE — Telephone Encounter (Signed)
Per Tabitha's note - pt is to start potassium chloride BID for 7 days. Rx has been sent in to reflect this. Pt is aware.

## 2023-08-25 NOTE — Telephone Encounter (Signed)
Rep from [facility] called stating pt's daughter called in stating [facility] states they don't have any rxs for pt. Rep states the daughter was currently In the pharmacy. Can rxs be resent?

## 2023-08-25 NOTE — Telephone Encounter (Signed)
Spoke with pt and she is aware of Tabitha's message. Pt was very upset and angry that she was going to have to take another medication. I attempted to make her a lab appointment but she said that she would call back. Will route message to Tabitha to make her aware.

## 2023-08-26 LAB — URINE CULTURE
MICRO NUMBER:: 15860690
SPECIMEN QUALITY:: ADEQUATE

## 2023-08-26 NOTE — Progress Notes (Signed)
noted 

## 2023-08-26 NOTE — Telephone Encounter (Signed)
Noted  

## 2023-08-27 ENCOUNTER — Ambulatory Visit: Payer: Self-pay | Admitting: Family

## 2023-08-27 LAB — GASTROINTESTINAL PATHOGEN PNL
CampyloBacter Group: DETECTED — AB
Norovirus GI/GII: NOT DETECTED
Rotavirus A: NOT DETECTED
Salmonella species: NOT DETECTED
Shiga Toxin 1: NOT DETECTED
Shiga Toxin 2: NOT DETECTED
Shigella Species: NOT DETECTED
Vibrio Group: NOT DETECTED
Yersinia enterocolitica: NOT DETECTED

## 2023-08-27 LAB — GIARDIA ANTIGEN
MICRO NUMBER:: 15860699
RESULT:: NOT DETECTED
SPECIMEN QUALITY:: ADEQUATE

## 2023-08-27 LAB — ISOLATE REFERRAL PH
MICRO NUMBER:: 15871099
SPECIMEN QUALITY:: ADEQUATE

## 2023-08-27 NOTE — Telephone Encounter (Signed)
  Chief Complaint: body aches Symptoms: aches in legs from hip to calves, legs feel "cold inside" Pertinent Negatives: Patient denies sob, rash, weakness, tingling, fever Disposition: [] ED /[] Urgent Care (no appt availability in office) / [x] Appointment(In office/virtual)/ []  [facility]/ [] Home Care/ [] Refused Recommended Disposition /[]  Mobile Bus/ []  Follow-up with PCP Additional Notes: Patient reports that she was recently prescribed a potassium supplement and has noticed that she is now having aches in her legs. Patient reports feeling aching from her hips to her calves on both legs and feeling like her legs are "cold on the inside" even though they are not cold to touch. Patient denies discoloration, weakness, or tingling in legs. Patient is unsure if this is due to her getting sick or if it is the potassium supplement she is taking. Patient reports she has taken potassium in the past with no issues. Per protocol, appt made 12/23. Patient advised to call back if symptoms worsen. Patient verbalized understanding.     Copied from CRM [id number]. Topic: Clinical - Red Word Triage >> Aug 27, 2023  3:22 PM Denese Killings wrote: Red Word that prompted transfer to Nurse Triage: Patient is on 2 medications Potassium and  cephalexin she states her legs are starting to cramp from thigh to her calf and legs are cold and she is having pain in her legs. Reason for Disposition  [1] MILD pain (e.g., does not interfere with normal activities) AND [2] present > 7 days  Answer Assessment - Initial Assessment Questions 1. ONSET: "When did the muscle aches or body pains start?"      yesterday 2. LOCATION: "What part of your body is hurting?" (e.g., entire body, arms, legs)      Both legs from hips down to calves 3. SEVERITY: "How bad is the pain?" (Scale 1-10; or mild, moderate, severe)   - MILD (1-3): doesn't interfere with normal activities    - MODERATE (4-7): interferes with normal  activities or awakens from sleep    - SEVERE (8-10):  excruciating pain, unable to do any normal activities      mild 4. CAUSE: "What do you think is causing the pains?"     Maybe my new medications 5. FEVER: "Have you been having fever?"     no 6. OTHER SYMPTOMS: "Do you have any other symptoms?" (e.g., chest pain, weakness, rash, cold or flu symptoms, weight loss)     Legs are not cold to touch, but feel cold inside  Protocols used: Muscle Aches and Body Pain-A-AH

## 2023-08-29 NOTE — Progress Notes (Unsigned)
    Colbey Wirtanen T. Deakin Lacek, MD, CAQ Sports Medicine [facility] at [facility] [address] [place], [address]  Phone: [phone]  FAX: [phone]  Analuiza Mcneary - 82 y.o. female  MRN [id number]  Date of Birth: December 28, 1940  Date: 08/30/2023  PCP: Mort Sawyers, FNP  Referral: Mort Sawyers, FNP  No chief complaint on file.  Subjective:   Yakelyn Fitzer is a 82 y.o. very pleasant female patient with There is no height or weight on file to calculate BMI. who presents with the following:  The patient presents with some ongoing aching in her legs.  She called the office last week and since she was having some new onset leg pain and aching she was made a follow-up appointment with me today here in the office.  She was recently started on antibiotics for presumed UTI.  The urine culture did show mixed flora.  She was started on Keflex.  She also did a positive fecal occult blood test.    Review of Systems is noted in the HPI, as appropriate  Objective:   There were no vitals taken for this visit.  GEN: No acute distress; alert,appropriate. PULM: Breathing comfortably in no respiratory distress PSYCH: Normally interactive.   Laboratory and Imaging Data:  Assessment and Plan:   ***

## 2023-08-30 ENCOUNTER — Ambulatory Visit (INDEPENDENT_AMBULATORY_CARE_PROVIDER_SITE_OTHER): Payer: Medicare HMO | Admitting: Family Medicine

## 2023-08-30 ENCOUNTER — Encounter: Payer: Self-pay | Admitting: Family Medicine

## 2023-08-30 VITALS — BP 140/74 | HR 71 | Temp 98.4°F | Ht 63.0 in | Wt 132.1 lb

## 2023-08-30 DIAGNOSIS — A045 Campylobacter enteritis: Secondary | ICD-10-CM | POA: Diagnosis not present

## 2023-08-30 DIAGNOSIS — R252 Cramp and spasm: Secondary | ICD-10-CM

## 2023-08-30 DIAGNOSIS — R52 Pain, unspecified: Secondary | ICD-10-CM

## 2023-08-30 MED ORDER — AZITHROMYCIN 250 MG PO TABS: ORAL_TABLET | ORAL | 0 refills | Status: AC

## 2023-08-30 NOTE — Telephone Encounter (Signed)
 Spoke with pt and she is aware of Roberta Curtis's response.

## 2023-09-06 ENCOUNTER — Ambulatory Visit (INDEPENDENT_AMBULATORY_CARE_PROVIDER_SITE_OTHER): Payer: Medicare HMO | Admitting: Family

## 2023-09-06 ENCOUNTER — Encounter: Payer: Self-pay | Admitting: Family

## 2023-09-06 VITALS — BP 132/74 | HR 98 | Temp 97.5°F | Ht 63.0 in | Wt 131.8 lb

## 2023-09-06 DIAGNOSIS — R42 Dizziness and giddiness: Secondary | ICD-10-CM

## 2023-09-06 DIAGNOSIS — E876 Hypokalemia: Secondary | ICD-10-CM | POA: Insufficient documentation

## 2023-09-06 DIAGNOSIS — R198 Other specified symptoms and signs involving the digestive system and abdomen: Secondary | ICD-10-CM | POA: Insufficient documentation

## 2023-09-06 DIAGNOSIS — R195 Other fecal abnormalities: Secondary | ICD-10-CM | POA: Insufficient documentation

## 2023-09-06 DIAGNOSIS — R5383 Other fatigue: Secondary | ICD-10-CM | POA: Insufficient documentation

## 2023-09-06 LAB — BASIC METABOLIC PANEL
BUN: 12 mg/dL (ref 6–23)
CO2: 27 meq/L (ref 19–32)
Calcium: 10.3 mg/dL (ref 8.4–10.5)
Chloride: 107 meq/L (ref 96–112)
Creatinine, Ser: 0.8 mg/dL (ref 0.40–1.20)
GFR: 69.06 mL/min (ref >60.00)
Glucose, Bld: 103 mg/dL — ABNORMAL HIGH (ref 70–99)
Potassium: 4.8 meq/L (ref 3.5–5.1)
Sodium: 145 meq/L (ref 135–145)

## 2023-09-06 LAB — CBC
HCT: 37.3 % (ref 36.0–46.0)
Hemoglobin: 11.7 g/dL — ABNORMAL LOW (ref 12.0–15.0)
MCHC: 31.4 g/dL (ref 30.0–36.0)
MCV: 75.9 fL — ABNORMAL LOW (ref 78.0–100.0)
Platelets: 305 10*3/uL (ref 150.0–400.0)
RBC: 4.92 Mil/uL (ref 3.87–5.11)
RDW: 15.5 % (ref 11.5–15.5)
WBC: 8.1 10*3/uL (ref 4.0–10.5)

## 2023-09-06 LAB — VITAMIN B12: Vitamin B-12: 683 pg/mL (ref 211–911)

## 2023-09-06 LAB — TSH: TSH: 1.68 u[IU]/mL (ref 0.35–5.50)

## 2023-09-06 NOTE — Assessment & Plan Note (Signed)
Again stressed to eat every 2-3 hours to avoid dipping blood sugars as pt does skip meals.

## 2023-09-06 NOTE — Assessment & Plan Note (Addendum)
Diarrhea resolved with zpack for campyylobacter, now with constipation. Advised to stop benefiber but also to increase water intake. Advised her to f/u with me if symptoms do not start to gradually improve.

## 2023-09-06 NOTE — Assessment & Plan Note (Addendum)
Ordering cbc, tsh, b12 pending results. Did tell patient that she may have fatigue for the next few weeks due to diarrhea, likely dehydration and symptoms that are resolving. Did advise increasing oral intake of water with goal 90 oz

## 2023-09-06 NOTE — Progress Notes (Signed)
Established Patient Office Visit  Subjective:      CC:  Chief Complaint  Patient presents with   Medical Management of Chronic Issues    Reports that she is doing better since her last visit but still feels very fatigued and "drained."    HPI: Roberta Curtis is a 82 y.o. female presenting on 09/06/2023 for Medical Management of Chronic Issues (Reports that she is doing better since her last visit but still feels very fatigued and "drained.")  Campylobacter, diagnosed 12/17 when she first came in to see me for abdominal pain and diarrhea. She had a urine in office with small amount of blood and leukocytes so keflex was given pending urine culture which turned out negative and keflex was stopped. Came in again 12/23 as symptoms were not improving and given zpack for campylobacter. She had been treated 12/17 with potassium cl 10 meq bid x 10 days as well for hypokalemia.   She does say that she is still feeling pretty fatigued however diarrhea has resolved, did start benefiber which has helped however now with constipation, no bowel movement in the last two days.   She is currently drinking only about two 16 oz and gatorade in between, maybe 1/3 of the container.  Appetite has improved. Back to eating breakfast. She doesn't typically eat lunch but she will eat dinner, and is tolerating the dinner.    Last metabolic panel Lab Results  Component Value Date   GLUCOSE 118 (H) 08/24/2023   NA 142 08/24/2023   K 3.1 (L) 08/24/2023   CL 106 08/24/2023   CO2 26 08/24/2023   BUN 12 08/24/2023   CREATININE 0.86 08/24/2023   GFR 63.34 08/24/2023   CALCIUM 8.8 08/24/2023   PROT 6.9 08/24/2023   ALBUMIN 3.9 08/24/2023   BILITOT 0.4 08/24/2023   ALKPHOS 60 08/24/2023   AST 22 08/24/2023   ALT 14 08/24/2023   ANIONGAP 7 04/09/2023         Social history:  Relevant past medical, surgical, family and social history reviewed and updated as indicated. Interim medical history since  our last visit reviewed.  Allergies and medications reviewed and updated.  DATA REVIEWED: CHART IN EPIC     ROS: Negative unless specifically indicated above in HPI.    Current Outpatient Medications:    albuterol (VENTOLIN HFA) 108 (90 Base) MCG/ACT inhaler, Inhale 1-2 puffs into the lungs as needed for wheezing or shortness of breath., Disp: 6.7 g, Rfl: 1   aspirin EC 81 MG tablet, Take 81 mg by mouth daily. Swallow whole., Disp: , Rfl:    Biotin 1 MG CAPS, Take 1,000 mcg by mouth daily. , Disp: , Rfl:    calcium carbonate (OS-CAL - DOSED IN MG OF ELEMENTAL CALCIUM) 1250 (500 Ca) MG tablet, Take 1 tablet by mouth daily., Disp: , Rfl:    Cholecalciferol (VITAMIN D3) 50 MCG (2000 UT) CAPS, Take 4,000 Units by mouth daily., Disp: , Rfl:    ciclopirox (PENLAC) 8 % solution, Apply topically at bedtime. Apply over nail and surrounding skin. Apply daily over previous coat. After seven (7) days, may remove with alcohol and continue cycle., Disp: 6.6 mL, Rfl: 0   cyanocobalamin (VITAMIN B12) 500 MCG tablet, Take 500 mcg by mouth daily., Disp: , Rfl:    esomeprazole (NEXIUM) 20 MG capsule, Take 1 capsule (20 mg total) by mouth daily at 12 noon., Disp: 90 capsule, Rfl: 3   ibandronate (BONIVA) 150 MG tablet, TAKE 1 TABLET (  150 MG TOTAL) BY MOUTH EVERY 30 (THIRTY) DAYS., Disp: 3 tablet, Rfl: 1   ketoconazole (NIZORAL) 2 % cream, Apply 1 Application topically daily., Disp: 15 g, Rfl: 0   magnesium oxide (MAG-OX) 400 (240 Mg) MG tablet, Take 400 mg by mouth daily., Disp: , Rfl:    meclizine (ANTIVERT) 12.5 MG tablet, TAKE 1 TABLET BY MOUTH 3 TIMES DAILY AS NEEDED., Disp: 30 tablet, Rfl: 0   vitamin C (ASCORBIC ACID) 250 MG tablet, Take 250 mg by mouth daily., Disp: , Rfl:       Objective:    BP 132/74 (BP Location: Left Arm, Patient Position: Sitting, Cuff Size: Normal)   Pulse 98   Temp (!) 97.5 F (36.4 C) (Temporal)   Ht 5\' 3"  (1.6 m)   Wt 131 lb 12.8 oz (59.8 kg)   SpO2 97%   BMI 23.35  kg/m   Wt Readings from Last 3 Encounters:  09/06/23 131 lb 12.8 oz (59.8 kg)  08/30/23 132 lb 2 oz (59.9 kg)  08/24/23 131 lb (59.4 kg)    Physical Exam Constitutional:      General: She is not in acute distress.    Appearance: Normal appearance. She is normal weight. She is not ill-appearing, toxic-appearing or diaphoretic.  HENT:     Head: Normocephalic.  Cardiovascular:     Rate and Rhythm: Normal rate.  Pulmonary:     Effort: Pulmonary effort is normal.  Abdominal:     Tenderness: There is no abdominal tenderness. There is no guarding or rebound.  Musculoskeletal:        General: Normal range of motion.  Neurological:     General: No focal deficit present.     Mental Status: She is alert and oriented to person, place, and time. Mental status is at baseline.  Psychiatric:        Mood and Affect: Mood normal.        Behavior: Behavior normal.        Thought Content: Thought content normal.        Judgment: Judgment normal.           Assessment & Plan:  Hypokalemia Assessment & Plan: Pt has stopped RX dosing due to s/e of lower ext cramping which has resolved.  Repeat potassium today.  Advised to stop otc potassium for now.    Orders: -     Basic metabolic panel  Other fatigue Assessment & Plan: Ordering cbc, tsh, b12 pending results. Did tell patient that she may have fatigue for the next few weeks due to diarrhea, likely dehydration and symptoms that are resolving. Did advise increasing oral intake of water with goal 90 oz    Orders: -     Vitamin B12 -     TSH -     Basic metabolic panel -     CBC  Fecal occult blood test positive Assessment & Plan: 09/2022 colonoscopy reviewed. 2 polyps , tubular adenomas otherwise normal Cbc reassuring, seeing hematology. No blood seen per patient.  Orders: -     CBC  Alternating constipation and diarrhea Assessment & Plan: Diarrhea resolved with zpack for campyylobacter, now with constipation. Advised to  stop benefiber but also to increase water intake. Advised her to f/u with me if symptoms do not start to gradually improve.    Dizziness Assessment & Plan: Again stressed to eat every 2-3 hours to avoid dipping blood sugars as pt does skip meals.       Return  if symptoms worsen or fail to improve.  Mort Sawyers, MSN, APRN, FNP-C [facility]

## 2023-09-06 NOTE — Progress Notes (Signed)
Please call, Roberta Curtis is the one that requested no mychart.  B12 thyroid are good!  Very mildly anemic but this is not abnormal for you. Potassium is back to normal, stop over the counter use as we discussed in office.

## 2023-09-06 NOTE — Assessment & Plan Note (Signed)
Pt has stopped RX dosing due to s/e of lower ext cramping which has resolved.  Repeat potassium today.  Advised to stop otc potassium for now.

## 2023-09-06 NOTE — Assessment & Plan Note (Signed)
09/2022 colonoscopy reviewed. 2 polyps , tubular adenomas otherwise normal Cbc reassuring, seeing hematology. No blood seen per patient.

## 2023-09-07 ENCOUNTER — Other Ambulatory Visit: Payer: Self-pay | Admitting: Family

## 2023-09-07 ENCOUNTER — Telehealth: Payer: Self-pay | Admitting: Family

## 2023-09-07 DIAGNOSIS — D649 Anemia, unspecified: Secondary | ICD-10-CM

## 2023-09-07 NOTE — Telephone Encounter (Signed)
Can we add IBC ferritin on labs that were drawn yesterday?

## 2023-09-10 ENCOUNTER — Other Ambulatory Visit (INDEPENDENT_AMBULATORY_CARE_PROVIDER_SITE_OTHER): Payer: Medicare HMO

## 2023-09-10 DIAGNOSIS — D649 Anemia, unspecified: Secondary | ICD-10-CM

## 2023-09-10 LAB — IBC + FERRITIN
Ferritin: 49.2 ng/mL (ref 10.0–291.0)
Iron: 128 ug/dL (ref 42–145)
Saturation Ratios: 40.8 % (ref 20.0–50.0)
TIBC: 313.6 ug/dL (ref 250.0–450.0)
Transferrin: 224 mg/dL (ref 212.0–360.0)

## 2023-09-10 NOTE — Telephone Encounter (Signed)
 Thanks terri for letting me know.   Roberta Curtis can we please call pt and let her know to come in for additional lab work so we can check her total iron?

## 2023-09-10 NOTE — Telephone Encounter (Signed)
 Spoke with pt and she has been scheduled for a lab only appointment today at 3:30pm.

## 2023-09-14 ENCOUNTER — Ambulatory Visit: Payer: Medicare HMO | Admitting: Dermatology

## 2023-09-14 DIAGNOSIS — L304 Erythema intertrigo: Secondary | ICD-10-CM

## 2023-09-14 DIAGNOSIS — L821 Other seborrheic keratosis: Secondary | ICD-10-CM

## 2023-09-14 MED ORDER — PIMECROLIMUS 1 % EX CREA: TOPICAL_CREAM | CUTANEOUS | 2 refills | Status: DC

## 2023-09-14 NOTE — Progress Notes (Signed)
   Follow Up Visit   Subjective  [facility] Ivins is a 83 y.o. female who presents for the following: Intertrigo f/u inframammary. Rash is improved, but not completely clear. She took fluconazole  x 5 days after last appointment, and is currently using ketoconazole  2% cream. Rash is still itchy. She also has a few growths on the back she would like checked. Not bothersome.    The following portions of the chart were reviewed this encounter and updated as appropriate: medications, allergies, medical history  Review of Systems:  No other skin or systemic complaints except as noted in HPI or Assessment and Plan.  Objective  Well appearing patient in no apparent distress; mood and affect are within normal limits.  A focused examination was performed of the following areas: Face, inframammary  Relevant exam findings are noted in the Assessment and Plan.    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 Condition is improving with treatment but not currently at goal.   Intertrigo is a chronic recurrent rash that occurs in skin fold areas that may be associated with friction; heat; moisture; yeast; fungus; and bacteria.  It is exacerbated by increased movement / activity; sweating; and higher atmospheric temperature.  Use of an absorbant powder such as Zeasorb AF powder or other OTC antifungal powder to the area daily can prevent rash recurrence. Other options to help keep the area dry include blow drying the area after bathing or using antiperspirant products such as Duradry sweat minimizing gel.  Treatment Plan: Start pimecrolimus  cream Apply to inframammary BID until itchy rash improved dsp 30 g 2Rf. If not covered by insurance will send in [facility] 2.5% cream, use up to 2 weeks at a time. Decrease ketoconazole  2% cream to once at bedtime Continue Zeasorb AF powder  every day.  SEBORRHEIC KERATOSIS - Stuck-on, waxy, tan-brown papules and/or plaques back - Benign-appearing - Discussed benign etiology and prognosis. - Observe - Call for any changes  Return if symptoms worsen or fail to improve.  IAndrea Kerns, CMA, am acting as scribe for Rexene Rattler, MD .   Documentation: I have reviewed the above documentation for accuracy and completeness, and I agree with the above.  Rexene Rattler, MD

## 2023-09-14 NOTE — Patient Instructions (Addendum)
 Start pimecrolimus  cream - Apply to itchy rash under breasts twice a day until improved. Continue anti-fungal powder under breasts once a day. Decrease ketoconazole  2% cream to bedtime under breasts. May increase to twice a day with flares.    Seborrheic Keratosis  What causes seborrheic keratoses? Seborrheic keratoses are harmless, common skin growths that first appear during adult life.  As time goes by, more growths appear.  Some people may develop a large number of them.  Seborrheic keratoses appear on both covered and uncovered body parts.  They are not caused by sunlight.  The tendency to develop seborrheic keratoses can be inherited.  They vary in color from skin-colored to gray, brown, or even black.  They can be either smooth or have a rough, warty surface.   Seborrheic keratoses are superficial and look as if they were stuck on the skin.  Under the microscope this type of keratosis looks like layers upon layers of skin.  That is why at times the top layer may seem to fall off, but the rest of the growth remains and re-grows.    Treatment Seborrheic keratoses do not need to be treated, but can easily be removed in the office.  Seborrheic keratoses often cause symptoms when they rub on clothing or jewelry.  Lesions can be in the way of shaving.  If they become inflamed, they can cause itching, soreness, or burning.  Removal of a seborrheic keratosis can be accomplished by freezing, burning, or surgery. If any spot bleeds, scabs, or grows rapidly, please return to have it checked, as these can be an indication of a skin cancer. Due to recent changes in healthcare laws, you may see results of your pathology and/or laboratory studies on MyChart before the doctors have had a chance to review them. We understand that in some cases there may be results that are confusing or concerning to you. Please understand that not all results are received at the same time and often the doctors may need to  interpret multiple results in order to provide you with the best plan of care or course of treatment. Therefore, we ask that you please give us  2 business days to thoroughly review all your results before contacting the office for clarification. Should we see a critical lab result, you will be contacted sooner.   If You Need Anything After Your Visit  If you have any questions or concerns for your doctor, please call our main line at [phone] and press option 4 to reach your doctor's medical assistant. If no one answers, please leave a voicemail as directed and we will return your call as soon as possible. Messages left after 4 pm will be answered the following business day.   You may also send us  a message via MyChart. We typically respond to MyChart messages within 1-2 business days.  For prescription refills, please ask your pharmacy to contact our office. Our fax number is [phone].  If you have an urgent issue when the clinic is closed that cannot wait until the next business day, you can page your doctor at the number below.    Please note that while we do our best to be available for urgent issues outside of office hours, we are not available 24/7.   If you have an urgent issue and are unable to reach us , you may choose to seek medical care at your doctor's office, retail clinic, urgent care center, or emergency room.  If you have a medical emergency,  please immediately call 911 or go to the emergency department.  Pager Numbers  - Dr. Hester: [phone]  - Dr. Jackquline: [phone]  - Dr. Claudene: [phone]   In the event of inclement weather, please call our main line at [phone] for an update on the status of any delays or closures.  Dermatology Medication Tips: Please keep the boxes that topical medications come in in order to help keep track of the instructions about where and how to use these. Pharmacies typically print the medication instructions only on  the boxes and not directly on the medication tubes.   If your medication is too expensive, please contact our office at [phone] option 4 or send us  a message through MyChart.   We are unable to tell what your co-pay for medications will be in advance as this is different depending on your insurance coverage. However, we may be able to find a substitute medication at lower cost or fill out paperwork to get insurance to cover a needed medication.   If a prior authorization is required to get your medication covered by your insurance company, please allow us  1-2 business days to complete this process.  Drug prices often vary depending on where the prescription is filled and some pharmacies may offer cheaper prices.  The website www.goodrx.com contains coupons for medications through different pharmacies. The prices here do not account for what the cost may be with help from insurance (it may be cheaper with your insurance), but the website can give you the price if you did not use any insurance.  - You can print the associated coupon and take it with your prescription to the pharmacy.  - You may also stop by our office during regular business hours and pick up a GoodRx coupon card.  - If you need your prescription sent electronically to a different pharmacy, notify our office through [facility] or by phone at [phone] option 4.     Si Usted Necesita Algo Despus de Su Visita  Tambin puede enviarnos un mensaje a travs de Clinical Cytogeneticist. Por lo general respondemos a los mensajes de MyChart en el transcurso de 1 a 2 das hbiles.  Para renovar recetas, por favor pida a su farmacia que se ponga en contacto con nuestra oficina. Randi lakes de fax es [place] [phone].  Si tiene un asunto urgente cuando la clnica est cerrada y que no puede esperar hasta el siguiente da hbil, puede llamar/localizar a su doctor(a) al nmero que aparece a continuacin.   Por favor, tenga en cuenta que  aunque hacemos todo lo posible para estar disponibles para asuntos urgentes fuera del horario de [place], no estamos disponibles las 24 horas del da, los 7 [address]  [phone] de la [place].   Si tiene un problema urgente y no puede comunicarse con nosotros, puede optar por buscar atencin mdica  en el consultorio de su doctor(a), en una clnica privada, en un centro de atencin urgente o en una sala de emergencias.  Si tiene engineer, drilling, por favor llame inmediatamente al 911 o vaya a la sala de emergencias.  Nmeros de bper  - Dr. Hester: [phone]  - Dra. Jackquline: [phone]  - Dr. Claudene: [phone]   En caso de inclemencias del tiempo, por favor llame a landry capes principal al [phone] para una actualizacin sobre el [place] de cualquier retraso o cierre.  Consejos para la medicacin en dermatologa: Por favor, guarde las cajas en las que vienen los medicamentos de uso tpico para ayudarle  a seguir las hughes supply dnde y cmo usarlos. Las farmacias generalmente imprimen las instrucciones del medicamento slo en las cajas y no directamente en los tubos del [place].   Si su medicamento es muy caro, por favor, pngase en contacto con landry rieger llamando al [phone] y presione la opcin 4 o envenos un mensaje a travs de Clinical Cytogeneticist.   No podemos decirle cul ser su copago por los medicamentos por adelantado ya que esto es diferente dependiendo de la cobertura de su seguro. Sin embargo, es posible que podamos encontrar un medicamento sustituto a audiological scientist un formulario para que el seguro cubra el medicamento que se considera necesario.   Si se requiere una autorizacin previa para que su compaa de seguros cubra su medicamento, por favor permtanos de 1 a 2 das hbiles para completar este proceso.  Los precios de los medicamentos varan con frecuencia dependiendo del environmental consultant de dnde se surte la receta y alguna farmacias pueden ofrecer precios ms  baratos.  El sitio web www.goodrx.com tiene cupones para medicamentos de health and safety inspector. Los precios aqu no tienen en cuenta lo que podra costar con la ayuda del seguro (puede ser ms barato con su seguro), pero el sitio web puede darle el precio si no utiliz tourist information centre manager.  - Puede imprimir el cupn correspondiente y llevarlo con su receta a la farmacia.  - Tambin puede pasar por nuestra oficina durante el horario de atencin regular y education officer, museum una tarjeta de cupones de GoodRx.  - Si necesita que su receta se enve electrnicamente a una farmacia diferente, informe a nuestra oficina a travs de MyChart de [place] o por telfono llamando al [phone] y presione la opcin 4.

## 2023-09-15 ENCOUNTER — Other Ambulatory Visit: Payer: Self-pay

## 2023-09-15 ENCOUNTER — Telehealth: Payer: Self-pay | Admitting: Family

## 2023-09-15 MED ORDER — HYDROCORTISONE 2.5 % EX CREA: TOPICAL_CREAM | CUTANEOUS | 0 refills | Status: DC

## 2023-09-15 NOTE — Progress Notes (Unsigned)
 Pimecrolimus not covered by insurance. Per office visit note hydrocortisone 2.5% cream sent to replace medication.

## 2023-09-15 NOTE — Telephone Encounter (Signed)
 Copied from CRM [id number]. Topic: General - Other >> Sep 15, 2023  3:08 PM Melissa C wrote: Reason for CRM: patient received a call with no message from clinic, returning call. Please advise with patient. Thank you

## 2023-09-15 NOTE — Telephone Encounter (Signed)
 Spoke with pt and advised her I had not called her but Tabitha did send her a message in MyChart letting her know about her lab results. I have gone over those with her. Nothing further was needed.

## 2023-10-05 ENCOUNTER — Ambulatory Visit
Admission: RE | Admit: 2023-10-05 | Discharge: 2023-10-05 | Disposition: A | Discharge status: Home / Self Care | Payer: Medicare HMO | Source: Ambulatory Visit | Attending: Family | Admitting: Family

## 2023-10-05 ENCOUNTER — Other Ambulatory Visit: Payer: Self-pay

## 2023-10-05 ENCOUNTER — Inpatient Hospital Stay: Payer: Medicare HMO | Attending: Oncology

## 2023-10-05 DIAGNOSIS — Z1231 Encounter for screening mammogram for malignant neoplasm of breast: Secondary | ICD-10-CM | POA: Insufficient documentation

## 2023-10-05 DIAGNOSIS — Z79899 Other long term (current) drug therapy: Secondary | ICD-10-CM | POA: Insufficient documentation

## 2023-10-05 DIAGNOSIS — C9201 Acute myeloblastic leukemia, in remission: Secondary | ICD-10-CM | POA: Diagnosis present

## 2023-10-05 LAB — CMP (CANCER CENTER ONLY)
ALT: 18 U/L (ref 0–44)
AST: 21 U/L (ref 15–41)
Albumin: 3.9 g/dL (ref 3.5–5.0)
Alkaline Phosphatase: 60 U/L (ref 38–126)
Anion gap: 8 (ref 5–15)
BUN: 15 mg/dL (ref 8–23)
CO2: 24 mmol/L (ref 22–32)
Calcium: 8.9 mg/dL (ref 8.9–10.3)
Chloride: 107 mmol/L (ref 98–111)
Creatinine: 0.79 mg/dL (ref 0.44–1.00)
GFR, Estimated: >60 mL/min (ref >60)
Glucose, Bld: 92 mg/dL (ref 70–99)
Potassium: 3.9 mmol/L (ref 3.5–5.1)
Sodium: 139 mmol/L (ref 135–145)
Total Bilirubin: 0.5 mg/dL (ref 0.0–1.2)
Total Protein: 7.1 g/dL (ref 6.5–8.1)

## 2023-10-05 LAB — CBC WITH DIFFERENTIAL (CANCER CENTER ONLY)
Abs Immature Granulocytes: 0.04 10*3/uL (ref 0.00–0.07)
Basophils Absolute: 0.1 10*3/uL (ref 0.0–0.1)
Basophils Relative: 1 %
Eosinophils Absolute: 0.2 10*3/uL (ref 0.0–0.5)
Eosinophils Relative: 2 %
HCT: 34 % — ABNORMAL LOW (ref 36.0–46.0)
Hemoglobin: 10.9 g/dL — ABNORMAL LOW (ref 12.0–15.0)
Immature Granulocytes: 0 %
Lymphocytes Relative: 31 %
Lymphs Abs: 2.9 10*3/uL (ref 0.7–4.0)
MCH: 24.1 pg — ABNORMAL LOW (ref 26.0–34.0)
MCHC: 32.1 g/dL (ref 30.0–36.0)
MCV: 75.1 fL — ABNORMAL LOW (ref 80.0–100.0)
Monocytes Absolute: 0.8 10*3/uL (ref 0.1–1.0)
Monocytes Relative: 9 %
Neutro Abs: 5.3 10*3/uL (ref 1.7–7.7)
Neutrophils Relative %: 57 %
Platelet Count: 249 10*3/uL (ref 150–400)
RBC: 4.53 MIL/uL (ref 3.87–5.11)
RDW: 17 % — ABNORMAL HIGH (ref 11.5–15.5)
WBC Count: 9.3 10*3/uL (ref 4.0–10.5)
nRBC: 0 % (ref 0.0–0.2)

## 2023-10-05 LAB — LACTATE DEHYDROGENASE: LDH: 143 U/L (ref 98–192)

## 2023-10-05 LAB — VITAMIN B12: Vitamin B-12: 534 pg/mL (ref 180–914)

## 2023-10-05 LAB — FOLATE: Folate: 12.2 ng/mL (ref >5.9)

## 2023-10-07 ENCOUNTER — Encounter: Payer: Self-pay | Admitting: Family

## 2023-10-07 LAB — COMP PANEL: LEUKEMIA/LYMPHOMA

## 2023-10-08 ENCOUNTER — Inpatient Hospital Stay: Payer: Medicare HMO

## 2023-10-11 ENCOUNTER — Inpatient Hospital Stay: Payer: Medicare HMO | Admitting: Oncology

## 2023-10-11 ENCOUNTER — Telehealth: Payer: Self-pay | Admitting: *Deleted

## 2023-10-11 NOTE — Assessment & Plan Note (Deleted)
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  Continue monitor.

## 2023-10-11 NOTE — Assessment & Plan Note (Deleted)
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 No increase blast.  Cytogenetics were normal. Labs reviewed and discussed with patient.  Continue clinica surveillance. I will hold off repeating bone marrow in 6 months.

## 2023-10-11 NOTE — Telephone Encounter (Signed)
she feels so bad but she wants to know the about the labs that she had done.  I spoke to [facility] and they are going to call the patient to see if she can be a video visit or need to send it out a little later for another appointment because she cannot come in today.

## 2023-10-11 NOTE — Telephone Encounter (Signed)
The pt.thought that she would get a video visit today to let her know if she is going to have to have another BM BX. She does not do well on my chart but I told her they can always use yourcell phone also,. Really she just wants her to get a call to say yes or no to the bone marrow biopsy

## 2023-10-12 ENCOUNTER — Other Ambulatory Visit: Payer: Self-pay

## 2023-10-12 DIAGNOSIS — C9201 Acute myeloblastic leukemia, in remission: Secondary | ICD-10-CM

## 2023-10-12 NOTE — Telephone Encounter (Signed)
Spoke to pt and informed her of the need for BM bx. Pt has accepted appt for 2/20 @8 :30 arrive 7:30am. Appt with MD on 2/28 @10 :30 for results. Please mail appt reminders.  Appt on 2/10 has been cancelled since pt will do BmBx first.

## 2023-10-15 ENCOUNTER — Telehealth: Payer: Self-pay | Admitting: Oncology

## 2023-10-15 NOTE — Telephone Encounter (Signed)
 I got an answering service msg from this patient to reschedule her Bone marrow biopsy on 2/20. She wants someone to call her. Team was notified.

## 2023-10-15 NOTE — Telephone Encounter (Signed)
 Spoke to patient and she states he has the flu and is not feeling well. Advised for her to reach out to PCP for flu/ covid tesitng. She would like to cancel the bone marrow bx scheduled on 2/20. She will keep appt with Dr. Babara on 2/28 to further discuss results and r/s bmbx at that time.   Request sent to Clarita (IR) to cancel bx.

## 2023-10-18 ENCOUNTER — Telehealth: Payer: Medicare HMO | Admitting: Oncology

## 2023-10-18 ENCOUNTER — Telehealth: Payer: Self-pay

## 2023-10-18 NOTE — Telephone Encounter (Deleted)
 Pt accepted appt for bx on 2/13 @1 :30 arrive 1 p.   Please schedule MD on 2/21 @ 10:15 for bx results. pt aware of appt details.

## 2023-10-18 NOTE — Telephone Encounter (Signed)
 Pt called c/o ongoing intermittent constipation... Pt has only been taking Miralax  PRN and Benefiber daily... Pt does not drink much water ...   Pt would like advice on how to best manage Sx and she does feel weak at times and dizzy.. These Sx resolve after having BM... Pt advised to drink more fluids and she needs to take Miralax  on a regular basis, not starting after she has not had a BM for a few days as it will take longer to work...   Please advise

## 2023-10-19 ENCOUNTER — Other Ambulatory Visit: Payer: Self-pay | Admitting: Family

## 2023-10-19 DIAGNOSIS — M8580 Other specified disorders of bone density and structure, unspecified site: Secondary | ICD-10-CM

## 2023-10-19 NOTE — Telephone Encounter (Signed)
Pt states she takes 1 capful as needed

## 2023-10-21 DIAGNOSIS — M5412 Radiculopathy, cervical region: Secondary | ICD-10-CM | POA: Insufficient documentation

## 2023-10-28 ENCOUNTER — Ambulatory Visit: Payer: Medicare HMO | Admitting: Radiology

## 2023-11-05 ENCOUNTER — Inpatient Hospital Stay: Payer: Medicare HMO | Attending: Oncology | Admitting: Oncology

## 2023-11-05 ENCOUNTER — Encounter: Payer: Self-pay | Admitting: Oncology

## 2023-11-05 ENCOUNTER — Telehealth: Payer: Self-pay

## 2023-11-05 VITALS — BP 158/65 | HR 62 | Temp 97.9°F | Resp 18 | Wt 136.8 lb

## 2023-11-05 DIAGNOSIS — C9201 Acute myeloblastic leukemia, in remission: Secondary | ICD-10-CM | POA: Diagnosis present

## 2023-11-05 DIAGNOSIS — Z148 Genetic carrier of other disease: Secondary | ICD-10-CM | POA: Diagnosis not present

## 2023-11-05 NOTE — Telephone Encounter (Signed)
 Request sent for BM Bx to be done mid-late March. Date pending.   Will schedule MD 2 weeks after Bx.

## 2023-11-06 NOTE — Progress Notes (Signed)
 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  Hb has decreased. Normal B12, Folate iron panel.  I recommend to repeat bone marrow biopsy  Hemochromatosis carrier 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  Continue monitor.    No orders of the defined types were placed in this encounter.   Follow up after bone marrow biopsy All questions were answered. The patient knows to call the clinic with any problems, questions or concerns.  Rickard Patience, MD, PhD [facility] Hematology Oncology 11/05/2023     HISTORY OF PRESENTING ILLNESS:   Roberta Curtis is a  83 y.o.  female with PMH listed below was seen in consultation at the request of  Dugal, Tabitha, FNP to establish care for  history of AML status post bone marrow transplant, hemochromatosis carrier. Patient moved from [place] to [place] in February 2021. She brought some of her oncology records. 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  Patient received induction daunorubincin and cytarabin.  04/18/2006- axilla mass exicision - microabscess with gram  positive cocci.   04/19/2006, bone marrow biopsy showed 50% cellularity, chemotherapeutic effect, day 14.  Persistent leukemia given persistent  CD117 cells, CD34 positive cells are slightly increased. 04/28/2006, bone marrow biopsy showed 50 to 75% cellularity with immature infiltrates, mild maturation present, markedly decreased erythroid elements with dyserythropoiesis, marked megakaryocytosis with dysplastic morphology, increased iron stores consistent with acute myeloid leukemia with multilineage dysplastic 05/28/2006 per note,  bone marrow which showed AML in remission 08/18/2006-admitted for bone marrow transplant, received busulfan and etoposide.  Received washed autologous peripheral blood stem cells on 08/27/2006.  Engraftment was noted on day 13-09/09/2006  Previously followed up with oncologist and has been having bone marrow biopsy surveillance annually and she has stayed in remission. .  Her last Bone marrow biopsy was done in 2019.  05/26/2018 Bone marrow biopsy showed normocellular marrow with adequate trilineage hematopoiesis, no morphology evidence of increase blasts, consistent with AML in remission.  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  Detail of mutation is unknown. She reports that she has had phlebotomy in the past. Reports she has had right axillary lymph node biopsy and was told by her doctors not to have blood drawn on the right upper extremity due to risk of lymphedema.  Patient tells me that her husband passed away from [facility] many years ago.  Jan 2023 Bone marrow biopsy results were reviewed and discussed with patient. No leukemia, lymphoma or high-grade dysplasia.  Cytogenetics were normal.   Jan 2024 Bone marrow biopsy showed 20% marrow cellularity, no increased blasts.  Cytogenetics were normal.   INTERVAL HISTORY Roberta Curtis is a 83  y.o.  female who has above history reviewed by me today presents for follow up visit for history of AML Patient reports feeling well.  Denies any constitutional symptoms. No new complaints. She takes B12 supplementation.    Review of Systems  Constitutional:  Negative for appetite change, chills, fatigue and fever.  HENT:   Negative for hearing loss and voice change.   Eyes:  Negative for eye problems.  Respiratory:  Negative for chest tightness and cough.   Cardiovascular:  Negative for chest pain.  Gastrointestinal:  Positive for constipation. Negative for abdominal distention, abdominal pain and blood in stool.  Endocrine: Negative for hot flashes.  Genitourinary:  Negative for difficulty urinating and frequency.   Musculoskeletal:  Negative for arthralgias.  Skin:  Negative for itching and rash.  Neurological:  Positive for light-headedness. Negative for extremity weakness.       Left side weakness  Hematological:  Negative for adenopathy.  Psychiatric/Behavioral:  Negative for confusion.     MEDICAL HISTORY:  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HCC)    GERD (gastroesophageal reflux disease)    Heart murmur    Osteopenia    Thrush     SURGICAL HISTORY: Past Surgical History:  Procedure Laterality Date   BONE MARROW TRANSPLANT     BREAST LUMPECTOMY Left    fatty tumor,1970's   CATARACT EXTRACTION     CESAREAN SECTION     COLONOSCOPY WITH PROPOFOL N/A 10/06/2022   Procedure: COLONOSCOPY WITH PROPOFOL;  Surgeon: Wyline Mood, MD;  Location: [facility] ENDOSCOPY;  Service: Gastroenterology;  Laterality: N/A;   EYE SURGERY     HYSTEROTOMY     LYMPH GLAND EXCISION     MENISCUS REPAIR Right    NECK SURGERY     SALPINGECTOMY     WISDOM TOOTH EXTRACTION      SOCIAL HISTORY: Social History   Socioeconomic History   Marital status: Widowed    Spouse name: Riley Nearing   Number of children: 3   Years of education: high school   Highest  education level: Not on file  Occupational History   Occupation: retired  Tobacco Use   Smoking status: Former    Current packs/day: 0.00    Average packs/day: 1 pack/day for 6.0 years (6.0 ttl pk-yrs)    Types: Cigarettes    Start date: 02/21/1964    Quit date: 02/20/1970    Years since quitting: 53.7   Smokeless tobacco: Never  Vaping Use   Vaping status: Never Used  Substance and Sexual Activity   Alcohol use: Not Currently   Drug use: Never   Sexual activity: Not Currently    Partners: Male    Birth control/protection: Post-menopausal  Other Topics Concern   Not on file  Social History Narrative      Right handed   Lives in a condo with domestic partner   From [place]      02/12/20   From: [place], moved to be near daugther   Living: with Riley Nearing - husband   Health Care Proxy: Lucia Gaskins (daughter)   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      Family: Actor, lives in [place]), [place] ([place], [place]), Leonette Most ([place])      Enjoys: meet new people, socialable      Exercise: not currently - cleaning   Diet: picky eater - eats veggies - but her husband doesn't like these things      Safety  Seat belts: Yes    Guns: Yes  and secure   Safe in relationships: Yes    Social Drivers of Health   Financial Resource Strain: Low Risk  (02/18/2021)   Overall Financial Resource Strain (CARDIA)    Difficulty of Paying Living Expenses: Not hard at all  Food Insecurity: No Food Insecurity (02/18/2021)   Hunger Vital Sign    Worried About Running Out of Food in the Last Year: Never true    Ran Out of Food in the Last Year: Never true  Transportation Needs: No Transportation Needs (02/18/2021)   PRAPARE - Administrator, Civil Service (Medical): No    Lack of Transportation (Non-Medical): No  Physical Activity: Inactive (02/18/2021)   Exercise Vital Sign    Days of Exercise per Week: 0 days    Minutes of Exercise per  Session: 0 min  Stress: No Stress Concern Present (02/18/2021)   [employer] of Occupational Health - Occupational Stress Questionnaire    Feeling of Stress : Not at all  Social Connections: Not on file  Intimate Partner Violence: Not At Risk (02/18/2021)   Humiliation, Afraid, Rape, and Kick questionnaire    Fear of Current or Ex-Partner: No    Emotionally Abused: No    Physically Abused: No    Sexually Abused: No    FAMILY HISTORY: Family History  Problem Relation Age of Onset   Retinitis pigmentosa Mother    Stroke Mother    Liver cancer Father    Heart disease Father    Heart disease Brother    Blindness Brother    Breast cancer Neg Hx     ALLERGIES:  is allergic to jadenu [deferasirox], ketotifen fumarate, levofloxacin, ondansetron hcl, codeine, oxycodone-acetaminophen, and penicillins.  MEDICATIONS:  Current Outpatient Medications  Medication Sig Dispense Refill   albuterol (VENTOLIN HFA) 108 (90 Base) MCG/ACT inhaler Inhale 1-2 puffs into the lungs as needed for wheezing or shortness of breath. 6.7 g 1   aspirin EC 81 MG tablet Take 81 mg by mouth daily. Swallow whole.     Biotin 1 MG CAPS Take 1,000 mcg by mouth daily.      calcium carbonate (OS-CAL - DOSED IN MG OF ELEMENTAL CALCIUM) 1250 (500 Ca) MG tablet Take 1 tablet by mouth daily.     Cholecalciferol (VITAMIN D3) 50 MCG (2000 UT) CAPS Take 4,000 Units by mouth daily.     ciclopirox (PENLAC) 8 % solution Apply topically at bedtime. Apply over nail and surrounding skin. Apply daily over previous coat. After seven (7) days, may remove with alcohol and continue cycle. 6.6 mL 0   cyanocobalamin (VITAMIN B12) 500 MCG tablet Take 500 mcg by mouth daily.     esomeprazole (NEXIUM) 20 MG capsule Take 1 capsule (20 mg total) by mouth daily at 12 noon. 90 capsule 3   hydrocortisone 2.5 % cream Apply to itchy rash BID until itching improves. Use up to two weeks at a time. 90 g 0   ibandronate (BONIVA) 150 MG tablet TAKE 1  TABLET (150 MG TOTAL) BY MOUTH EVERY 30 (THIRTY) DAYS. 3 tablet 1   ketoconazole (NIZORAL) 2 % cream Apply 1 Application topically daily. 15 g 0   magnesium oxide (MAG-OX) 400 (240 Mg) MG tablet Take 400 mg by mouth daily.     meclizine (ANTIVERT) 12.5 MG tablet TAKE 1 TABLET BY MOUTH 3 TIMES DAILY AS NEEDED. 30 tablet 0   pimecrolimus (ELIDEL) 1 % cream Apply under breasts twice  daily until itchy rash improved. 30 g 2   vitamin C (ASCORBIC ACID) 250 MG tablet Take 250 mg by mouth daily.     No current facility-administered medications for this visit.     PHYSICAL EXAMINATION: ECOG PERFORMANCE STATUS: 0 - Asymptomatic Vitals:   11/05/23 1042  BP: (!) 158/65  Pulse: 62  Resp: 18  Temp: 97.9 F (36.6 C)  SpO2: 100%   Filed Weights   11/05/23 1042  Weight: 136 lb 12.8 oz (62.1 kg)    Physical Exam Constitutional:      General: She is not in acute distress. HENT:     Head: Normocephalic and atraumatic.  Eyes:     General: No scleral icterus. Cardiovascular:     Rate and Rhythm: Normal rate and regular rhythm.  Pulmonary:     Effort: Pulmonary effort is normal. No respiratory distress.     Breath sounds: No wheezing.  Abdominal:     General: There is no distension.     Palpations: Abdomen is soft.  Musculoskeletal:        General: No deformity. Normal range of motion.     Cervical back: Normal range of motion and neck supple.  Skin:    General: Skin is warm and dry.     Findings: No rash.  Neurological:     Mental Status: She is alert and oriented to person, place, and time. Mental status is at baseline.     Cranial Nerves: No cranial nerve deficit.     Coordination: Coordination normal.  Psychiatric:        Mood and Affect: Mood normal.     LABORATORY DATA:  I have reviewed the data as listed     Latest Ref Rng & Units 10/05/2023    1:40 PM 09/06/2023   11:15 AM 08/24/2023   10:50 AM  CBC  WBC 4.0 - 10.5 K/uL 9.3  8.1  7.4   Hemoglobin 12.0 - 15.0 g/dL  87.5  64.3  32.9   Hematocrit 36.0 - 46.0 % 34.0  37.3  38.1   Platelets 150 - 400 K/uL 249  305.0  246.0       Latest Ref Rng & Units 10/05/2023    1:40 PM 09/06/2023   11:15 AM 08/24/2023   10:50 AM  CMP  Glucose 70 - 99 mg/dL 92  518  841   BUN 8 - 23 mg/dL 15  12  12    Creatinine 0.44 - 1.00 mg/dL 6.60  6.30  1.60   Sodium 135 - 145 mmol/L 139  145  142   Potassium 3.5 - 5.1 mmol/L 3.9  4.8  3.1   Chloride 98 - 111 mmol/L 107  107  106   CO2 22 - 32 mmol/L 24  27  26    Calcium 8.9 - 10.3 mg/dL 8.9  10.9  8.8   Total Protein 6.5 - 8.1 g/dL 7.1   6.9   Total Bilirubin 0.0 - 1.2 mg/dL 0.5   0.4   Alkaline Phos 38 - 126 U/L 60   60   AST 15 - 41 U/L 21   22   ALT 0 - 44 U/L 18   14     Iron/TIBC/Ferritin/ %Sat    Component Value Date/Time   IRON 128 09/10/2023 1301   IRON 108 08/06/2019 0000   TIBC 313.6 09/10/2023 1301   TIBC 324 08/06/2019 0000   FERRITIN 49.2 09/10/2023 1301   FERRITIN 74 08/06/2019 0000   IRONPCTSAT  40.8 09/10/2023 1301   IRONPCTSAT 33 08/06/2019 0000      RADIOGRAPHIC STUDIES: I have personally reviewed the radiological images as listed and agreed with the findings in the report. MM 3D SCREENING MAMMOGRAM BILATERAL BREAST Result Date: 10/07/2023 CLINICAL DATA:  Screening. EXAM: DIGITAL SCREENING BILATERAL MAMMOGRAM WITH TOMOSYNTHESIS AND CAD TECHNIQUE: Bilateral screening digital craniocaudal and mediolateral oblique mammograms were obtained. Bilateral screening digital breast tomosynthesis was performed. The images were evaluated with computer-aided detection. COMPARISON:  Previous exam(s). ACR Breast Density Category c: The breasts are heterogeneously dense, which may obscure small masses. FINDINGS: There are no findings suspicious for malignancy. IMPRESSION: No mammographic evidence of malignancy. A result letter of this screening mammogram will be mailed directly to the patient. RECOMMENDATION: Screening mammogram in one year. (Code:SM-B-01Y) BI-RADS  CATEGORY  1: Negative. Electronically Signed   By: Jacob Moores M.D.   On: 10/07/2023 11:27   DG Bone Density Result Date: 08/12/2023 EXAM: DUAL X-RAY ABSORPTIOMETRY (DXA) FOR BONE MINERAL DENSITY IMPRESSION: Your patient Ishani Goldwasser Gorczyca completed a BMD test on 08/12/2023 using the [employer] DXA System (software version: 14.10) manufactured by [employer]. The following summarizes the results of our evaluation. Technologist: SCE PATIENT BIOGRAPHICAL: Name: Sianne, Tejada Patient ID: [id number] Birth Date: Dec 22, 1940 Height: 63.0 in. Gender: Female Exam Date: 08/12/2023 Weight: 134.6 lbs. Indications: Advanced Age, Asthma, Caucasian, Height Loss, History of Chemo, Hysterectomy, Kidney Disease, Oophorectomy Bilateral, Postmenopausal Fractures: Treatments: Boniva, Calcium, Vitamin D DENSITOMETRY RESULTS: Site         Region     Measured Date Measured Age WHO Classification Young Adult T-score BMD         %Change vs. Previous Significant Change (*) DualFemur Neck Right 9/12/2023 82.4 Osteoporosis -2.7 0.669 g/cm2 -6.7% - DualFemur Neck Right 03/12/2021 79.0 Osteopenia -2.3 0.717 g/cm2 - - DualFemur Total Mean 08/12/2023 81.4 Osteopenia -2.0 0.758 g/cm2 -1.4% - DualFemur Total Mean 4/12/2021 79.0 Osteopenia -1.9 0.769 g/cm2 - - Left Forearm Radius 33% 08/12/2023 81.4 Osteopenia -2.2 0.683 g/cm2 0.6% - Left Forearm Radius 33% 4/12/2021 79.0 Osteopenia -2.3 0.679 g/cm2 - - ASSESSMENT: The BMD measured at Femur Neck Right is 0.669 g/cm2 with a T-score of -2.7. This patient is considered osteoporotic according to World Health Organization [facility]) criteria. The scan quality is good. The lumbar spine was excluded due to degenerative changes. Compared with prior study, there has been no significant change in the total hip. World Science writer [facility]) criteria for post-menopausal, Caucasian Women: Normal:                   T-score at or above -1 SD Osteopenia/low bone mass: T-score between -1 and  -2.5 SD Osteoporosis:             T-score at or below -2.5 SD RECOMMENDATIONS: 1. All patients should optimize calcium and vitamin D intake. 2. Consider FDA-approved medical therapies in postmenopausal women and men aged 41 years and older, based on the following: a. A hip or vertebral(clinical or morphometric) fracture b. T-score < -2.5 at the femoral neck or spine after appropriate evaluation to exclude secondary causes c. Low bone mass (T-score between -1.0 and -2.5 at the femoral neck or spine) and a 10-year probability of a hip fracture > 3% or a 10-year probability of a major osteoporosis-related fracture > 20% based on the [place]-adapted WHO algorithm 3. Clinician judgment and/or patient preferences may indicate treatment for people with 10-year fracture probabilities above or below these levels FOLLOW-UP: People with  diagnosed cases of osteoporosis or at high risk for fracture should have regular bone mineral density tests. For patients eligible for Medicare, routine testing is allowed once every 2 years. The testing frequency can be increased to one year for patients who have rapidly progressing disease, those who are receiving or discontinuing medical therapy to restore bone mass, or have additional risk factors. I have reviewed this report, and agree with the above findings. [facility] Electronically Signed   By: Romona Curls M.D.   On: 08/12/2023 11:03

## 2023-11-06 NOTE — Assessment & Plan Note (Signed)
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  Continue monitor.

## 2023-11-06 NOTE — Assessment & Plan Note (Signed)
#  History of AML in 2007, status post bone marrow transplant. 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  Hb has decreased. Normal B12, Folate iron panel.  I recommend to repeat bone marrow biopsy

## 2023-11-08 ENCOUNTER — Other Ambulatory Visit: Payer: Self-pay

## 2023-11-08 DIAGNOSIS — C9201 Acute myeloblastic leukemia, in remission: Secondary | ICD-10-CM

## 2023-11-08 NOTE — Telephone Encounter (Signed)
 Pt scheduled for BMBx on 3/19 @ 8:30a, arrive 7:30a. Spoke to pt's husband to inform him of appt details.   Please schedule MD (for bx results) on 4/1 @ 2:30p

## 2023-11-10 DIAGNOSIS — M542 Cervicalgia: Secondary | ICD-10-CM | POA: Insufficient documentation

## 2023-11-22 NOTE — H&P (Signed)
 Chief Complaint: Patient was seen in consultation today for AML   Referring Physician(s): Yu,Zhou  Supervising Physician: Marliss Coots  Patient Status: [facility] - Out-pt  History of Present Illness: Roberta Curtis is an 83 y.o. female with a medical history significant for AML in remission. She states she has been in remission for 17 years but has required yearly bone marrow biopsies for surveillance. She is familiar to IR from several bone marrow biopsies/aspirations with the last one on 10/01/22. The patient had hoped that would be her last one but recent labs show a drop in her hemoglobin levels. Her oncology team has requested a bone marrow biopsy with aspiration for further evaluation.   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HCC)    GERD (gastroesophageal reflux disease)    Heart murmur    Osteopenia    Thrush     Past Surgical History:  Procedure Laterality Date   BONE MARROW TRANSPLANT     BREAST LUMPECTOMY Left    fatty [id number],6045'W   CATARACT EXTRACTION     CESAREAN SECTION     COLONOSCOPY WITH PROPOFOL N/A 10/06/2022   Procedure: COLONOSCOPY WITH PROPOFOL;  Surgeon: Wyline Mood, MD;  Location: [facility] ENDOSCOPY;  Service: Gastroenterology;  Laterality: N/A;   EYE SURGERY     HYSTEROTOMY     LYMPH GLAND EXCISION     MENISCUS REPAIR Right    NECK SURGERY     SALPINGECTOMY     WISDOM TOOTH EXTRACTION      Allergies: Jadenu [deferasirox], Ketotifen fumarate, Levofloxacin, Ondansetron hcl, Codeine, Oxycodone-acetaminophen, and Penicillins  Medications: Prior to Admission medications   Medication Sig Start Date End Date Taking? Authorizing Provider  albuterol (VENTOLIN HFA) 108 (90 Base) MCG/ACT inhaler Inhale 1-2 puffs into the lungs as needed for wheezing or shortness of breath. 05/25/22   Gweneth Dimitri, MD  aspirin EC 81 MG tablet Take 81 mg by mouth daily. Swallow whole.    [provider]  Biotin 1 MG CAPS Take 1,000  mcg by mouth daily.     [provider]  calcium carbonate (OS-CAL - DOSED IN MG OF ELEMENTAL CALCIUM) 1250 (500 Ca) MG tablet Take 1 tablet by mouth daily.    [provider]  Cholecalciferol (VITAMIN D3) 50 MCG (2000 UT) CAPS Take 4,000 Units by mouth daily.    [provider]  ciclopirox (PENLAC) 8 % solution Apply topically at bedtime. Apply over nail and surrounding skin. Apply daily over previous coat. After seven (7) days, may remove with alcohol and continue cycle. 08/13/23   Mort Sawyers, FNP  cyanocobalamin (VITAMIN B12) 500 MCG tablet Take 500 mcg by mouth daily.    [provider]  esomeprazole (NEXIUM) 20 MG capsule Take 1 capsule (20 mg total) by mouth daily at 12 noon. 08/24/23   Mort Sawyers, FNP  hydrocortisone 2.5 % cream Apply to itchy rash BID until itching improves. Use up to two weeks at a time. 09/15/23   Willeen Niece, MD  ibandronate (BONIVA) 150 MG tablet TAKE 1 TABLET (150 MG TOTAL) BY MOUTH EVERY 30 (THIRTY) DAYS. 10/20/23   Mort Sawyers, FNP  ketoconazole (NIZORAL) 2 % cream Apply 1 Application topically daily. 08/09/23   Mort Sawyers, FNP  magnesium oxide (MAG-OX) 400 (240 Mg) MG tablet Take 400 mg by mouth daily.    [provider]  meclizine (ANTIVERT) 12.5 MG tablet TAKE 1 TABLET BY MOUTH 3 TIMES DAILY AS NEEDED. 04/09/23  Mort Sawyers, FNP  pimecrolimus (ELIDEL) 1 % cream Apply under breasts twice daily until itchy rash improved. 09/14/23   Willeen Niece, MD  vitamin C (ASCORBIC ACID) 250 MG tablet Take 250 mg by mouth daily.    [provider]     Family History  Problem Relation Age of Onset   Retinitis pigmentosa Mother    Stroke Mother    Liver cancer Father    Heart disease Father    Heart disease Brother    Blindness Brother    Breast cancer Neg Hx     Social History   Socioeconomic History   Marital status: Widowed    Spouse name: Roberta Curtis   Number of children: 3   Years of  education: high school   Highest education level: Not on file  Occupational History   Occupation: retired  Tobacco Use   Smoking status: Former    Current packs/day: 0.00    Average packs/day: 1 pack/day for 6.0 years (6.0 ttl pk-yrs)    Types: Cigarettes    Start date: 02/21/1964    Quit date: 02/20/1970    Years since quitting: 53.7   Smokeless tobacco: Never  Vaping Use   Vaping status: Never Used  Substance and Sexual Activity   Alcohol use: Not Currently   Drug use: Never   Sexual activity: Not Currently    Partners: Male    Birth control/protection: Post-menopausal  Other Topics Concern   Not on file  Social History Narrative      Right handed   Lives in a condo with domestic partner   From [place]      02/12/20   From: [place], moved to be near daugther   Living: with Roberta Curtis - husband   Health Care Proxy: Roberta Curtis (daughter)   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      Family: Actor, lives in [place]), [place] ([place], [place]), [place] ([place]      Enjoys: meet new people, socialable      Exercise: not currently - cleaning   Diet: picky eater - eats veggies - but her husband doesn't like these things      Safety   Seat belts: Yes    Guns: Yes  and secure   Safe in relationships: Yes    Social Drivers of Health   Financial Resource Strain: Low Risk  (02/18/2021)   Overall Financial Resource Strain (CARDIA)    Difficulty of Paying Living Expenses: Not hard at all  Food Insecurity: No Food Insecurity (02/18/2021)   Hunger Vital Sign    Worried About Running Out of Food in the Last Year: Never true    Ran Out of Food in the Last Year: Never true  Transportation Needs: No Transportation Needs (02/18/2021)   PRAPARE - Administrator, Civil Service (Medical): No    Lack of Transportation (Non-Medical): No  Physical Activity: Inactive (02/18/2021)   Exercise Vital Sign    Days of Exercise per Week: 0  days    Minutes of Exercise per Session: 0 min  Stress: No Stress Concern Present (02/18/2021)   [employer] of Occupational Health - Occupational Stress Questionnaire    Feeling of Stress : Not at all  Social Connections: Not on file    Review of Systems: A 12 point ROS discussed and pertinent positives are indicated in the HPI above.  All other systems are negative.  Review of Systems  All other systems  reviewed and are negative.   Vital Signs: BP 139/62   Pulse 74   Temp 98 F (36.7 C) (Oral)   Resp 19   Ht 5\' 2"  (1.575 m)   Wt 133 lb (60.3 kg)   SpO2 98%   BMI 24.33 kg/m   Physical Exam Constitutional:      General: She is not in acute distress.    Appearance: She is not ill-appearing.  HENT:     Mouth/Throat:     Mouth: Mucous membranes are moist.     Pharynx: Oropharynx is clear.  Cardiovascular:     Rate and Rhythm: Normal rate.     Pulses: Normal pulses.  Pulmonary:     Effort: Pulmonary effort is normal.  Abdominal:     Tenderness: There is no abdominal tenderness.  Skin:    General: Skin is warm and dry.  Neurological:     Mental Status: She is alert and oriented to person, place, and time.  Psychiatric:        Mood and Affect: Mood normal.        Behavior: Behavior normal.        Thought Content: Thought content normal.        Judgment: Judgment normal.     Imaging: No results found.  Labs:  CBC: Recent Labs    08/24/23 1050 09/06/23 1115 10/05/23 1340 11/24/23 0757  WBC 7.4 8.1 9.3 6.7  HGB 12.1 11.7* 10.9* 11.5*  HCT 38.1 37.3 34.0* 35.4*  PLT 246.0 305.0 249 264    COAGS: No results for input(s): "INR", "APTT" in the last 8760 hours.  BMP: Recent Labs    04/09/23 0855 08/24/23 1050 09/06/23 1115 10/05/23 1340  NA 140 142 145 139  K 4.1 3.1* 4.8 3.9  CL 107 106 107 107  CO2 26 26 27 24   GLUCOSE 103* 118* 103* 92  BUN 12 12 12 15   CALCIUM 9.2 8.8 10.3 8.9  CREATININE 0.68 0.86 0.80 0.79  GFRNONAA >60  --   --   >60    LIVER FUNCTION TESTS: Recent Labs    02/04/23 1012 04/09/23 0855 08/24/23 1050 10/05/23 1340  BILITOT 0.6 0.6 0.4 0.5  AST 19 22 22 21   ALT 11 19 14 18   ALKPHOS 57 53 60 60  PROT 7.2 7.1 6.9 7.1  ALBUMIN 4.3 3.8 3.9 3.9    TUMOR MARKERS: No results for input(s): "AFPTM", "CEA", "CA199", "CHROMGRNA" in the last 8760 hours.  Assessment and Plan:  History of AML; recent decrease in hemoglobin levels: Roberta Curtis, 83 year old female presents today to the [facility] Interventional Radiology department for an image-guided bone marrow biopsy with aspiration.   Risks and benefits of bone marrow biopsy with aspiration were discussed with the patient and/or patient's family including, but not limited to bleeding, infection, damage to adjacent structures or low yield requiring additional tests.   All of the questions were answered and there is agreement to proceed. She has been NPO. She is a full code.    Consent signed and in chart.  Thank you for this interesting consult.  I greatly enjoyed meeting Roberta Curtis [facility] and look forward to participating in their care.  A copy of this report was sent to the requesting provider on this date.  Electronically Signed: Alwyn Ren, AGACNP-BC 11/24/2023, 8:20 AM   I spent a total of  30 Minutes   in face to face in clinical consultation, greater than 50% of which  was counseling/coordinating care for AML.

## 2023-11-23 ENCOUNTER — Other Ambulatory Visit (HOSPITAL_COMMUNITY): Payer: Self-pay | Admitting: Radiology

## 2023-11-23 DIAGNOSIS — C9201 Acute myeloblastic leukemia, in remission: Secondary | ICD-10-CM

## 2023-11-23 NOTE — Progress Notes (Signed)
 Patient for IR Bone Marrow Biopsy on Wed 11/24/23, I called and spoke with the patient on the phone and gave pre-procedure instructions. Pt was made aware to be here at 7:30a, NPO after MN prior to procedure as well as driver post procedure/recovery/discharge. Pt stated understanding.  Called 11/23/23

## 2023-11-24 ENCOUNTER — Encounter: Payer: Self-pay | Admitting: Radiology

## 2023-11-24 ENCOUNTER — Other Ambulatory Visit: Payer: Self-pay

## 2023-11-24 ENCOUNTER — Ambulatory Visit
Admission: RE | Admit: 2023-11-24 | Discharge: 2023-11-24 | Disposition: A | Discharge status: Home / Self Care | Source: Ambulatory Visit | Attending: Oncology | Admitting: Oncology

## 2023-11-24 DIAGNOSIS — Z87891 Personal history of nicotine dependence: Secondary | ICD-10-CM | POA: Insufficient documentation

## 2023-11-24 DIAGNOSIS — D509 Iron deficiency anemia, unspecified: Secondary | ICD-10-CM | POA: Insufficient documentation

## 2023-11-24 DIAGNOSIS — C9201 Acute myeloblastic leukemia, in remission: Secondary | ICD-10-CM | POA: Insufficient documentation

## 2023-11-24 HISTORY — PX: IR BONE MARROW BIOPSY & ASPIRATION: IMG5727

## 2023-11-24 LAB — CBC WITH DIFFERENTIAL/PLATELET
Abs Immature Granulocytes: 0.02 10*3/uL (ref 0.00–0.07)
Basophils Absolute: 0.1 10*3/uL (ref 0.0–0.1)
Basophils Relative: 1 %
Eosinophils Absolute: 0.2 10*3/uL (ref 0.0–0.5)
Eosinophils Relative: 3 %
HCT: 35.4 % — ABNORMAL LOW (ref 36.0–46.0)
Hemoglobin: 11.5 g/dL — ABNORMAL LOW (ref 12.0–15.0)
Immature Granulocytes: 0 %
Lymphocytes Relative: 33 %
Lymphs Abs: 2.2 10*3/uL (ref 0.7–4.0)
MCH: 24 pg — ABNORMAL LOW (ref 26.0–34.0)
MCHC: 32.5 g/dL (ref 30.0–36.0)
MCV: 73.8 fL — ABNORMAL LOW (ref 80.0–100.0)
Monocytes Absolute: 0.6 10*3/uL (ref 0.1–1.0)
Monocytes Relative: 10 %
Neutro Abs: 3.6 10*3/uL (ref 1.7–7.7)
Neutrophils Relative %: 53 %
Platelets: 264 10*3/uL (ref 150–400)
RBC: 4.8 MIL/uL (ref 3.87–5.11)
RDW: 16 % — ABNORMAL HIGH (ref 11.5–15.5)
WBC: 6.7 10*3/uL (ref 4.0–10.5)
nRBC: 0 % (ref 0.0–0.2)

## 2023-11-24 MED ORDER — MIDAZOLAM HCL 2 MG/2ML IJ SOLN
INTRAMUSCULAR | Status: AC | PRN
  Administered 2023-11-24: 1 mg via INTRAVENOUS

## 2023-11-24 MED ORDER — HEPARIN SOD (PORK) LOCK FLUSH 100 UNIT/ML IV SOLN
INTRAVENOUS | Status: AC
  Filled 2023-11-24: qty 5

## 2023-11-24 MED ORDER — LIDOCAINE 1 % OPTIME INJ - NO CHARGE
10.0000 mL | Freq: Once | INTRAMUSCULAR | Status: AC
  Administered 2023-11-24: 10 mL
  Filled 2023-11-24: qty 10

## 2023-11-24 MED ORDER — MIDAZOLAM HCL 2 MG/2ML IJ SOLN
INTRAMUSCULAR | Status: AC
  Filled 2023-11-24: qty 2

## 2023-11-24 MED ORDER — FENTANYL CITRATE (PF) 100 MCG/2ML IJ SOLN
INTRAMUSCULAR | Status: AC | PRN
  Administered 2023-11-24: 50 ug via INTRAVENOUS
  Administered 2023-11-24 (×2): 25 ug via INTRAVENOUS

## 2023-11-24 MED ORDER — FENTANYL CITRATE (PF) 100 MCG/2ML IJ SOLN
INTRAMUSCULAR | Status: AC
  Filled 2023-11-24: qty 2

## 2023-11-24 MED ORDER — HEPARIN SOD (PORK) LOCK FLUSH 100 UNIT/ML IV SOLN: 500.0000 [IU] | Freq: Once | INTRAVENOUS | Status: DC

## 2023-11-24 MED ORDER — MIDAZOLAM HCL 5 MG/5ML IJ SOLN: INTRAMUSCULAR | Status: AC | PRN

## 2023-11-24 MED ORDER — SODIUM CHLORIDE 0.9 % IV SOLN: INTRAVENOUS | Status: DC

## 2023-11-24 NOTE — Procedures (Signed)
Interventional Radiology Procedure Note  Procedure: Fluoroscopic guided aspirate and core biopsy of right iliac bone  Complications: None  Recommendations: - Bedrest supine x 1 hrs - Hydrocodone PRN  Pain - Follow biopsy results    , MD   

## 2023-11-24 NOTE — Progress Notes (Signed)
 Called husband, Casimiro Needle, to let him know when Mrs.Schobert would be ready. He will be here to get her in about an hour.

## 2023-11-24 NOTE — Progress Notes (Signed)
 Patient clinically stable post IR BMB per Dr Elby Showers, tolerated well. Vitals stable pre and post procedure. Denies complaints post procedure. Received Versed 1 mg along with Fentanyl 100 mcg IV for procedure. Report given to Verl Dicker post procedure/specials/3

## 2023-11-26 LAB — SURGICAL PATHOLOGY

## 2023-11-29 ENCOUNTER — Ambulatory Visit (INDEPENDENT_AMBULATORY_CARE_PROVIDER_SITE_OTHER): Admitting: Podiatry

## 2023-11-29 ENCOUNTER — Ambulatory Visit (INDEPENDENT_AMBULATORY_CARE_PROVIDER_SITE_OTHER)

## 2023-11-29 ENCOUNTER — Encounter: Payer: Self-pay | Admitting: Podiatry

## 2023-11-29 DIAGNOSIS — L603 Nail dystrophy: Secondary | ICD-10-CM

## 2023-11-29 DIAGNOSIS — M2012 Hallux valgus (acquired), left foot: Secondary | ICD-10-CM | POA: Diagnosis not present

## 2023-11-29 NOTE — Progress Notes (Signed)
 Subjective:  Patient ID: Roberta Curtis, female    DOB: [birth year],  MRN: [id number] HPI Chief Complaint  Patient presents with   Foot Pain    1st MPJ left - notices toe is curving inward more towards the 2nd, not painful, but concerned about it shifting   Nail Problem    Hallux bilateral - thick, discolored nails, Rx'd ciclopirox-been using 6 months, but not helping   New Patient (Initial Visit)    83 y.o. female presents with the above complaint.   ROS: Denies fever chills nausea vomit muscle aches pains calf pain back pain chest pain shortness of breath.  She was concerned about her hallux left curving toward her second toe was told by her neighbor that she is going to have to have surgery to correct that.  She states that it does not hurt.  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HCC)    GERD (gastroesophageal reflux disease)    Heart murmur    Osteopenia    Thrush    Past Surgical History:  Procedure Laterality Date   BONE MARROW TRANSPLANT     BREAST LUMPECTOMY Left    fatty [id number],1914'N   CATARACT EXTRACTION     CESAREAN SECTION     COLONOSCOPY WITH PROPOFOL N/A 10/06/2022   Procedure: COLONOSCOPY WITH PROPOFOL;  Surgeon: Wyline Mood, MD;  Location: [facility] ENDOSCOPY;  Service: Gastroenterology;  Laterality: N/A;   EYE SURGERY     HYSTEROTOMY     IR BONE MARROW BIOPSY & ASPIRATION  11/24/2023   LYMPH GLAND EXCISION     MENISCUS REPAIR Right    NECK SURGERY     SALPINGECTOMY     WISDOM TOOTH EXTRACTION      Current Outpatient Medications:    albuterol (VENTOLIN HFA) 108 (90 Base) MCG/ACT inhaler, Inhale 1-2 puffs into the lungs as needed for wheezing or shortness of breath., Disp: 6.7 g, Rfl: 1   aspirin EC 81 MG tablet, Take 81 mg by mouth daily. Swallow whole., Disp: , Rfl:    Biotin 1 MG CAPS, Take 1,000 mcg by mouth daily. , Disp: , Rfl:    calcium carbonate (OS-CAL - DOSED IN MG OF ELEMENTAL CALCIUM) 1250 (500 Ca) MG tablet, Take  1 tablet by mouth daily., Disp: , Rfl:    Cholecalciferol (VITAMIN D3) 50 MCG (2000 UT) CAPS, Take 4,000 Units by mouth daily., Disp: , Rfl:    ciclopirox (PENLAC) 8 % solution, Apply topically at bedtime. Apply over nail and surrounding skin. Apply daily over previous coat. After seven (7) days, may remove with alcohol and continue cycle., Disp: 6.6 mL, Rfl: 0   cyanocobalamin (VITAMIN B12) 500 MCG tablet, Take 500 mcg by mouth daily., Disp: , Rfl:    esomeprazole (NEXIUM) 20 MG capsule, Take 1 capsule (20 mg total) by mouth daily at 12 noon., Disp: 90 capsule, Rfl: 3   hydrocortisone 2.5 % cream, Apply to itchy rash BID until itching improves. Use up to two weeks at a time., Disp: 90 g, Rfl: 0   ibandronate (BONIVA) 150 MG tablet, TAKE 1 TABLET (150 MG TOTAL) BY MOUTH EVERY 30 (THIRTY) DAYS., Disp: 3 tablet, Rfl: 1   ketoconazole (NIZORAL) 2 % cream, Apply 1 Application topically daily., Disp: 15 g, Rfl: 0   magnesium oxide (MAG-OX) 400 (240 Mg) MG tablet, Take 400 mg by mouth daily., Disp: , Rfl:    meclizine (ANTIVERT) 12.5 MG tablet, TAKE 1 TABLET BY MOUTH 3 TIMES  DAILY AS NEEDED., Disp: 30 tablet, Rfl: 0   pimecrolimus (ELIDEL) 1 % cream, Apply under breasts twice daily until itchy rash improved., Disp: 30 g, Rfl: 2   vitamin C (ASCORBIC ACID) 250 MG tablet, Take 250 mg by mouth daily., Disp: , Rfl:   Allergies  Allergen Reactions   Jadenu [Deferasirox] Other (See Comments)    Elevated Iron, headache and rash   Ketotifen Fumarate    Levofloxacin Other (See Comments)   Ondansetron Hcl Other (See Comments)   Codeine Palpitations   Oxycodone-Acetaminophen Palpitations and Other (See Comments)   Penicillins Rash   Review of Systems Objective:  There were no vitals filed for this visit.  General: Well developed, nourished, in no acute distress, alert and oriented x3   Dermatological: Skin is warm, dry and supple bilateral. Nails x 10 are well maintained; remaining integument appears  unremarkable at this time. There are no open sores, no preulcerative lesions, no rash or signs of infection present.  Hallux nails bilaterally are thick discolored mildly tender.  The subungual debris is present.  Vascular: Dorsalis Pedis artery and Posterior Tibial artery pedal pulses are 2/4 bilateral with immedate capillary fill time. Pedal hair growth present. No varicosities and no lower extremity edema present bilateral.   Neruologic: Grossly intact via light touch bilateral. Vibratory intact via tuning fork bilateral. Protective threshold with Semmes Wienstein monofilament intact to all pedal sites bilateral. Patellar and Achilles deep tendon reflexes 2+ bilateral. No Babinski or clonus noted bilateral.   Musculoskeletal: No gross boney pedal deformities bilateral. No pain, crepitus, or limitation noted with foot and ankle range of motion bilateral. Muscular strength 5/5 in all groups tested bilateral.  Hallux valgus left.  Hallux interphalangeal left.  Gait: Unassisted, Nonantalgic.    Radiographs:  None taken  Assessment & Plan:   Assessment: Hallux abductovalgus deformity with interphalangeal left.  Nail dystrophy hallux bilateral  Plan: Discussed etiology pathology conservative surgical therapies at this point samples of the skin and nail were taken today pathologic evaluation follow-up with her as needed.     Roberta Curtis T. [place], [place]

## 2023-11-30 LAB — SURGICAL PATHOLOGY

## 2023-12-02 ENCOUNTER — Encounter (HOSPITAL_COMMUNITY): Payer: Self-pay | Admitting: Oncology

## 2023-12-07 ENCOUNTER — Encounter: Payer: Self-pay | Admitting: Oncology

## 2023-12-07 ENCOUNTER — Telehealth: Payer: Self-pay

## 2023-12-07 ENCOUNTER — Inpatient Hospital Stay: Attending: Oncology | Admitting: Oncology

## 2023-12-07 ENCOUNTER — Inpatient Hospital Stay: Admitting: Oncology

## 2023-12-07 VITALS — BP 124/65 | HR 77 | Temp 98.0°F | Resp 18 | Wt 139.4 lb

## 2023-12-07 DIAGNOSIS — Z148 Genetic carrier of other disease: Secondary | ICD-10-CM | POA: Diagnosis not present

## 2023-12-07 DIAGNOSIS — C9201 Acute myeloblastic leukemia, in remission: Secondary | ICD-10-CM | POA: Insufficient documentation

## 2023-12-07 DIAGNOSIS — Z87891 Personal history of nicotine dependence: Secondary | ICD-10-CM | POA: Diagnosis not present

## 2023-12-07 NOTE — Telephone Encounter (Signed)
 Spoke to [place] at [facility] lab to request next gene MDS panle addon to [phone]

## 2023-12-07 NOTE — Assessment & Plan Note (Signed)
#  History of AML in 2007, status post bone marrow transplant.  Labs reviewed and discussed with patient.  Bone marrow biopsy was reviewed and discussed with patient. No dysplasia or increased blasts.  Cytogenetics positive for 5 q. deletion.  Will add NGS panel. Continue observation.

## 2023-12-07 NOTE — Progress Notes (Signed)
 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  Labs reviewed and discussed with patient.  Bone marrow biopsy was reviewed and discussed with patient. No dysplasia or increased blasts.  Cytogenetics positive for 5 q. deletion.  Will add NGS panel. Continue observation.  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  Continue monitor.   Hypocalcemia Recommend calcium and Vitamin D supplementation.    Orders Placed This Encounter  Procedures   CBC with Differential (Cancer Center Only)    Standing Status:   Future    Expected Date:   06/07/2024    Expiration Date:   12/06/2024   CMP (Cancer Center only)    Standing Status:   Future    Expected Date:   06/07/2024    Expiration Date:   12/06/2024   Ferritin    Standing Status:   Future    Expected Date:   06/07/2024    Expiration Date:   12/06/2024   Iron and TIBC    Standing Status:   Future    Expected Date:   06/07/2024    Expiration Date:   12/06/2024    Follow up after bone marrow biopsy All questions were answered. The patient knows to call the clinic with any problems, questions or concerns.  Rickard Patience, MD, PhD [facility] Hematology Oncology 12/07/2023     HISTORY OF PRESENTING ILLNESS:   Roberta Curtis is a  83 y.o.  female with PMH listed below was seen in consultation at the request of  Dugal, Tabitha, FNP to establish care for  history of AML status post bone marrow transplant, hemochromatosis carrier. Patient moved from [place] to [place] in February 2021. She brought some of her oncology records. Extensive medical record review was performed by me Per note, patient has a history of acute myeloid leukemia, diagnosed in  2007, status post bone marrow transplant 04/05/2006 bone marrow biopsy showed 50 to 75% cellularity, immature infiltrates, markedly decreased erythroid elements with dyserythropoiesis, marked megakaryocytosis with dysplastic morphology, increased iron stores consistent with acute myeloid leukemia with multilineage dysplasia- cytogenetics positive for del 12.  Patient received induction daunorubincin and cytarabin.  04/18/2006- axilla mass exicision - microabscess with gram positive cocci.   04/19/2006, bone marrow biopsy showed 50% cellularity, chemotherapeutic effect, day 14.  Persistent leukemia given persistent  CD117 cells, CD34 positive cells are slightly increased. 04/28/2006, bone marrow biopsy showed 50 to 75% cellularity with immature infiltrates, mild maturation present, markedly decreased erythroid elements with dyserythropoiesis, marked megakaryocytosis with dysplastic morphology, increased iron stores consistent with acute myeloid leukemia with multilineage dysplastic 05/28/2006 per note,  bone marrow which showed AML in remission 08/18/2006-admitted for bone marrow transplant, received busulfan and etoposide.  Received washed autologous peripheral blood stem cells on 08/27/2006.  Engraftment was noted on day 13-09/09/2006  Previously followed up with oncologist and has been having bone marrow biopsy surveillance annually and she has stayed in remission. .  Her last Bone marrow biopsy was done in 2019.  05/26/2018 Bone marrow biopsy showed normocellular marrow with adequate trilineage hematopoiesis, no morphology evidence of increase blasts, consistent with AML in remission.  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  Detail of mutation is unknown. She reports that she has had phlebotomy in the past. Reports she has had right axillary lymph node biopsy and was told by her  doctors not to have blood drawn on the right upper extremity due to risk of lymphedema.  Patient tells me that her husband passed away from [facility] many years ago.  Jan 2023 Bone marrow biopsy results were reviewed and discussed with patient. No leukemia, lymphoma or high-grade dysplasia.  Cytogenetics were normal.   Jan 2024 Bone marrow biopsy showed 20% marrow cellularity, no increased blasts.  Cytogenetics were normal.   INTERVAL HISTORY Roberta Curtis is a 83 y.o. female who has above history reviewed by me today presents for follow up visit for history of AML Patient presents to discuss bone marrow biopsy results. 11/24/2022, bone marrow biopsy showed variable cellular bone marrow, 10 to 50% with trilineage hematopoiesis with no increase in blast. The patient's history of acute myeloid leukemia is noted.  Morphologic  evaluation reveals a hypercellular bone marrow with trilineage  hematopoiesis.  Blasts are not increased.  No significant dysplasia is  identified in any of the lineages.  Flow cytometric analysis does not  reveal any abnormal immature, B or T-cell population.  Secondary causes  of the patient's anemia may be considered.  Correlation with pending  cytogenetics is recommended for further assessment.    Review of Systems  Constitutional:  Negative for appetite change, chills, fatigue and fever.  HENT:   Negative for hearing loss and voice change.   Eyes:  Negative for eye problems.  Respiratory:  Negative for chest tightness and cough.   Cardiovascular:  Negative for chest pain.  Gastrointestinal:  Positive for constipation. Negative for abdominal distention, abdominal pain and blood in stool.  Endocrine: Negative for hot flashes.  Genitourinary:  Negative for difficulty urinating and frequency.   Musculoskeletal:  Negative for arthralgias.  Skin:  Negative for itching and rash.  Neurological:  Negative for extremity weakness and light-headedness.       Left side  weakness  Hematological:  Negative for adenopathy.  Psychiatric/Behavioral:  Negative for confusion.     MEDICAL HISTORY:  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HCC)    GERD (gastroesophageal reflux disease)    Heart murmur    Osteopenia    Thrush     SURGICAL HISTORY: Past Surgical History:  Procedure Laterality Date   BONE MARROW TRANSPLANT     BREAST LUMPECTOMY Left    fatty tumor,1970's   CATARACT EXTRACTION     CESAREAN SECTION     COLONOSCOPY WITH PROPOFOL N/A 10/06/2022   Procedure: COLONOSCOPY WITH PROPOFOL;  Surgeon: Wyline Mood, MD;  Location: [facility] ENDOSCOPY;  Service: Gastroenterology;  Laterality: N/A;   EYE SURGERY     HYSTEROTOMY     IR BONE MARROW BIOPSY & ASPIRATION  11/24/2023   LYMPH GLAND EXCISION     MENISCUS REPAIR Right    NECK SURGERY     SALPINGECTOMY     WISDOM TOOTH EXTRACTION      SOCIAL HISTORY: Social History   Socioeconomic History   Marital status: Widowed    Spouse name: Riley Nearing   Number of children: 3   Years of education: high school   Highest education level: Not on file  Occupational History   Occupation: retired  Tobacco Use   Smoking status: Former    Current packs/day: 0.00    Average packs/day: 1 pack/day  for 6.0 years (6.0 ttl pk-yrs)    Types: Cigarettes    Start date: 02/21/1964    Quit date: 02/20/1970    Years since quitting: 53.8   Smokeless tobacco: Never  Vaping Use   Vaping status: Never Used  Substance and Sexual Activity   Alcohol use: Not Currently   Drug use: Never   Sexual activity: Not Currently    Partners: Male    Birth control/protection: Post-menopausal  Other Topics Concern   Not on file  Social History Narrative      Right handed   Lives in a condo with domestic partner   From [place]      02/12/20   From: [place], moved to be near daugther   Living: with Riley Nearing - husband   Health Care Proxy: Lucia Gaskins  (daughter)   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      Family: Actor, lives in [place]), [place] ([place], [place]), [place] ([place]      Enjoys: meet new people, socialable      Exercise: not currently - cleaning   Diet: picky eater - eats veggies - but her husband doesn't like these things      Safety   Seat belts: Yes    Guns: Yes  and secure   Safe in relationships: Yes    Social Drivers of Health   Financial Resource Strain: Low Risk  (02/18/2021)   Overall Financial Resource Strain (CARDIA)    Difficulty of Paying Living Expenses: Not hard at all  Food Insecurity: No Food Insecurity (02/18/2021)   Hunger Vital Sign    Worried About Running Out of Food in the Last Year: Never true    Ran Out of Food in the Last Year: Never true  Transportation Needs: No Transportation Needs (02/18/2021)   PRAPARE - Administrator, Civil Service (Medical): No    Lack of Transportation (Non-Medical): No  Physical Activity: Inactive (02/18/2021)   Exercise Vital Sign    Days of Exercise per Week: 0 days    Minutes of Exercise per Session: 0 min  Stress: No Stress Concern Present (02/18/2021)   [employer] of Occupational Health - Occupational Stress Questionnaire    Feeling of Stress : Not at all  Social Connections: Not on file  Intimate Partner Violence: Not At Risk (02/18/2021)   Humiliation, Afraid, Rape, and Kick questionnaire    Fear of Current or Ex-Partner: No    Emotionally Abused: No    Physically Abused: No    Sexually Abused: No    FAMILY HISTORY: Family History  Problem Relation Age of Onset   Retinitis pigmentosa Mother    Stroke Mother    Liver cancer Father    Heart disease Father    Heart disease Brother    Blindness Brother    Breast cancer Neg Hx     ALLERGIES:  is allergic to jadenu [deferasirox], ketotifen fumarate, levofloxacin, ondansetron hcl, codeine, oxycodone-acetaminophen, and penicillins.  MEDICATIONS:  Current Outpatient  Medications  Medication Sig Dispense Refill   albuterol (VENTOLIN HFA) 108 (90 Base) MCG/ACT inhaler Inhale 1-2 puffs into the lungs as needed for wheezing or shortness of breath. 6.7 g 1   aspirin EC 81 MG tablet Take 81 mg by mouth daily. Swallow whole.     Biotin 1 MG CAPS Take 1,000 mcg by mouth daily.      calcium carbonate (OS-CAL - DOSED IN MG OF ELEMENTAL CALCIUM) 1250 (500 Ca)  MG tablet Take 1 tablet by mouth daily.     Cholecalciferol (VITAMIN D3) 50 MCG (2000 UT) CAPS Take 4,000 Units by mouth daily.     ciclopirox (PENLAC) 8 % solution Apply topically at bedtime. Apply over nail and surrounding skin. Apply daily over previous coat. After seven (7) days, may remove with alcohol and continue cycle. 6.6 mL 0   cyanocobalamin (VITAMIN B12) 500 MCG tablet Take 500 mcg by mouth daily.     esomeprazole (NEXIUM) 20 MG capsule Take 1 capsule (20 mg total) by mouth daily at 12 noon. 90 capsule 3   ibandronate (BONIVA) 150 MG tablet TAKE 1 TABLET (150 MG TOTAL) BY MOUTH EVERY 30 (THIRTY) DAYS. 3 tablet 1   ketoconazole (NIZORAL) 2 % cream Apply 1 Application topically daily. 15 g 0   magnesium oxide (MAG-OX) 400 (240 Mg) MG tablet Take 400 mg by mouth daily.     meclizine (ANTIVERT) 12.5 MG tablet TAKE 1 TABLET BY MOUTH 3 TIMES DAILY AS NEEDED. 30 tablet 0   pimecrolimus (ELIDEL) 1 % cream Apply under breasts twice daily until itchy rash improved. 30 g 2   vitamin C (ASCORBIC ACID) 250 MG tablet Take 250 mg by mouth daily.     No current facility-administered medications for this visit.     PHYSICAL EXAMINATION: ECOG PERFORMANCE STATUS: 0 - Asymptomatic Vitals:   12/07/23 1131  BP: 124/65  Pulse: 77  Resp: 18  Temp: 98 F (36.7 C)   Filed Weights   12/07/23 1131  Weight: 139 lb 6.4 oz (63.2 kg)    Physical Exam Constitutional:      General: She is not in acute distress. HENT:     Head: Normocephalic and atraumatic.  Eyes:     General: No scleral icterus. Cardiovascular:      Rate and Rhythm: Normal rate and regular rhythm.  Pulmonary:     Effort: Pulmonary effort is normal. No respiratory distress.     Breath sounds: No wheezing.  Abdominal:     General: There is no distension.     Palpations: Abdomen is soft.  Musculoskeletal:        General: No deformity. Normal range of motion.     Cervical back: Normal range of motion and neck supple.  Skin:    General: Skin is dry.     Findings: No rash.  Neurological:     Mental Status: She is alert and oriented to person, place, and time. Mental status is at baseline.  Psychiatric:        Mood and Affect: Mood normal.     LABORATORY DATA:  I have reviewed the data as listed     Latest Ref Rng & Units 11/24/2023    7:57 AM 10/05/2023    1:40 PM 09/06/2023   11:15 AM  CBC  WBC 4.0 - 10.5 K/uL 6.7  9.3  8.1   Hemoglobin 12.0 - 15.0 g/dL 56.2  13.0  86.5   Hematocrit 36.0 - 46.0 % 35.4  34.0  37.3   Platelets 150 - 400 K/uL 264  249  305.0       Latest Ref Rng & Units 10/05/2023    1:40 PM 09/06/2023   11:15 AM 08/24/2023   10:50 AM  CMP  Glucose 70 - 99 mg/dL 92  784  696   BUN 8 - 23 mg/dL 15  12  12    Creatinine 0.44 - 1.00 mg/dL 2.95  2.84  1.32   Sodium  135 - 145 mmol/L 139  145  142   Potassium 3.5 - 5.1 mmol/L 3.9  4.8  3.1   Chloride 98 - 111 mmol/L 107  107  106   CO2 22 - 32 mmol/L 24  27  26    Calcium 8.9 - 10.3 mg/dL 8.9  16.1  8.8   Total Protein 6.5 - 8.1 g/dL 7.1   6.9   Total Bilirubin 0.0 - 1.2 mg/dL 0.5   0.4   Alkaline Phos 38 - 126 U/L 60   60   AST 15 - 41 U/L 21   22   ALT 0 - 44 U/L 18   14     Iron/TIBC/Ferritin/ %Sat    Component Value Date/Time   IRON 128 09/10/2023 1301   IRON 108 08/06/2019 0000   TIBC 313.6 09/10/2023 1301   TIBC 324 08/06/2019 0000   FERRITIN 49.2 09/10/2023 1301   FERRITIN 74 08/06/2019 0000   IRONPCTSAT 40.8 09/10/2023 1301   IRONPCTSAT 33 08/06/2019 0000      RADIOGRAPHIC STUDIES: I have personally reviewed the radiological images as  listed and agreed with the findings in the report. DG Foot Complete Left Result Date: 11/29/2023 Please see detailed radiograph report in office note.  IR BONE MARROW BIOPSY & ASPIRATION Result Date: 11/24/2023 INDICATION: 83 year old female with history of acute myeloid leukemia. EXAM: FLUOROSCOPIC-GUIDED BONE MARROW BIOPSY AND ASPIRATION MEDICATIONS: None ANESTHESIA/SEDATION: Fentanyl 100 mcg IV; Versed 1 mg IV Sedation Time: 10 minutes; The patient was continuously monitored during the procedure by the interventional radiology nurse under my direct supervision. COMPLICATIONS: None immediate. FLUOROSCOPY: Three mGy PROCEDURE: Informed consent was obtained from the patient following an explanation of the procedure, risks, benefits and alternatives. The patient understands, agrees and consents for the procedure. All questions were addressed. A time out was performed prior to the initiation of the procedure. The patient was positioned prone and the right iliac marrow space was identified fluoroscopically. The operative site was prepped and draped in the usual sterile fashion. Under sterile conditions and local anesthesia, a 22 gauge spinal needle was utilized for procedural planning. Next, an 11 gauge coaxial bone biopsy needle was advanced into the right iliac marrow space. Initially, a bone marrow aspiration was performed. Next, a bone marrow biopsy was obtained with the 11 gauge outer bone marrow device. The needle was removed and superficial hemostasis was obtained with manual compression. A dressing was applied. The patient tolerated the procedure well without immediate post procedural complication. IMPRESSION: Successful fluoroscopic guided right iliac bone marrow aspiration and core biopsy. Marliss Coots, MD [facility] Marliss Coots, MD [facility] Electronically Signed   By: Marliss Coots  M.D.   On: 11/24/2023 09:28   MM 3D SCREENING MAMMOGRAM BILATERAL BREAST Result Date: 10/07/2023 CLINICAL DATA:  Screening. EXAM: DIGITAL SCREENING BILATERAL MAMMOGRAM WITH TOMOSYNTHESIS AND CAD TECHNIQUE: Bilateral screening digital craniocaudal and mediolateral oblique mammograms were obtained. Bilateral screening digital breast tomosynthesis was performed. The images were evaluated with computer-aided detection. COMPARISON:  Previous exam(s). ACR Breast Density Category c: The breasts are heterogeneously dense, which may obscure small masses. FINDINGS: There are no findings suspicious for malignancy. IMPRESSION: No mammographic evidence of malignancy. A result letter of this screening mammogram will be mailed directly to the patient. RECOMMENDATION: Screening mammogram in one year. (Code:SM-B-01Y) BI-RADS CATEGORY  1: Negative. Electronically Signed   By: Jacob Moores M.D.   On: 10/07/2023 11:27

## 2023-12-07 NOTE — Assessment & Plan Note (Signed)
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  Continue monitor.

## 2023-12-07 NOTE — Assessment & Plan Note (Signed)
Recommend calcium and Vitamin D supplementation.

## 2023-12-13 ENCOUNTER — Other Ambulatory Visit: Payer: Self-pay | Admitting: Neurosurgery

## 2023-12-13 DIAGNOSIS — M5412 Radiculopathy, cervical region: Secondary | ICD-10-CM

## 2023-12-20 ENCOUNTER — Ambulatory Visit
Admission: RE | Admit: 2023-12-20 | Discharge: 2023-12-20 | Disposition: A | Discharge status: Home / Self Care | Source: Ambulatory Visit | Attending: Neurosurgery | Admitting: Neurosurgery

## 2023-12-20 ENCOUNTER — Encounter (HOSPITAL_COMMUNITY): Payer: Self-pay | Admitting: Oncology

## 2023-12-20 DIAGNOSIS — M5412 Radiculopathy, cervical region: Secondary | ICD-10-CM | POA: Insufficient documentation

## 2024-01-05 ENCOUNTER — Ambulatory Visit: Admitting: Podiatry

## 2024-01-14 ENCOUNTER — Encounter: Payer: Self-pay | Admitting: Primary Care

## 2024-01-14 ENCOUNTER — Ambulatory Visit (INDEPENDENT_AMBULATORY_CARE_PROVIDER_SITE_OTHER): Admitting: Primary Care

## 2024-01-14 VITALS — BP 154/82 | HR 78 | Temp 97.3°F | Ht 62.0 in | Wt 140.0 lb

## 2024-01-14 DIAGNOSIS — M4802 Spinal stenosis, cervical region: Secondary | ICD-10-CM | POA: Diagnosis not present

## 2024-01-14 DIAGNOSIS — R519 Headache, unspecified: Secondary | ICD-10-CM

## 2024-01-14 DIAGNOSIS — R2689 Other abnormalities of gait and mobility: Secondary | ICD-10-CM | POA: Diagnosis not present

## 2024-01-14 LAB — POC URINALSYSI DIPSTICK (AUTOMATED)
Bilirubin, UA: NEGATIVE
Blood, UA: NEGATIVE
Glucose, UA: NEGATIVE
Ketones, UA: POSITIVE
Leukocytes, UA: NEGATIVE
Nitrite, UA: NEGATIVE
Protein, UA: NEGATIVE
Spec Grav, UA: 1.015 (ref 1.010–1.025)
Urobilinogen, UA: 0.2 U/dL
pH, UA: 6.5 (ref 5.0–8.0)

## 2024-01-14 NOTE — Assessment & Plan Note (Addendum)
 Reviewed MRI cervical spine, and it is quite possible that headaches are related to her chronic degenerative disc disease/cervical spinal stenosis. Neuroexam today reassuring.   She did have an elevated BP reading today, also on recheck. No prior history of hypertension.  Will have her discontinue NSAIDs Her daughter will monitor her blood pressure at home.  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

## 2024-01-14 NOTE — Assessment & Plan Note (Signed)
 Along with several other potentially neurological symptoms. Neuroexam today reassuring.  She participated in HPI well for the most part. Reviewed MRI cervical spine from April 2025 which could certainly be contributing to some of her symptoms.  We had a long discussion today regarding her prior hospital admission, current symptoms, and our plan moving forward.  Urinalysis today negative. CBC and BMP pending.  Discussed to stop meloxicam and methocarbamol . Recommended family attend appointments with patient for a better understanding of the plan.   Close follow-up with PCP if symptoms do not improve.

## 2024-01-14 NOTE — Progress Notes (Signed)
 Subjective:    Patient ID: Roberta Curtis, female    DOB: 10-Aug-1941, 83 y.o.   MRN: [id number]  Allergic Reaction Pertinent negatives include no chest pain or coughing.    Roberta Curtis is a very pleasant 83 y.o. female patient of Tabitha, NP with a history of TIA, emphysema, neuropathy, CKD, chronic nausea, chronic fatigue, AML in remission who presents today to discuss multiple symptoms.  Her daughter joins us  today who is helping to provide for HPI.   Symptoms include imbalance, double vision, facial "fullness", head "fullness", increased constipation. Symptoms began shortly after taking methocarbamol  muscle relaxer. She's also been taking Meloxicam for several weeks. These were prescribed by her orthopedic doctor for chronic neck pain and posterior bilateral shoulder pain.   Her last bowel movement was yesterday. She hardly drinks water  or any liquids throughout the day. She hardly eats fiber. She spends most of her day siting in a chair.  About 2 years ago she was admitted to the hospital for the same symptoms. A complete work was done and there was no cause for her symptoms. Also evaluated by neurology, last visit was in November 2024, no cause found for her symptoms. Her daughter and family suspected that her symptoms were secondary to polypharmacy, particularly the overuse of compazine . Her family weaned her off all medications and found that her symptoms completely resolved.   As her current symptoms are identical to the symptoms she had two years ago she  believes that her symptoms are secondary to methocarbamol  and meloxicam.   She underwent MRI cervical spine in April 2025 which showed:  IMPRESSION: 1. Increased, mild spinal stenosis at C2-3. 2. Prior C3-C7 ACDF with unchanged mild residual spinal stenosis at C5-6. 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 3. Unchanged mild spinal stenosis and moderate bilateral neural foraminal stenosis at C7-T1.  Review of Systems   Constitutional:  Positive for fatigue. Negative for fever.  Eyes:  Positive for visual disturbance.  Respiratory:  Negative for cough and shortness of breath.   Cardiovascular:  Negative for chest pain.  Gastrointestinal:  Positive for constipation.  Genitourinary:  Negative for dysuria, flank pain and frequency.  Musculoskeletal:  Positive for arthralgias and neck pain.  Neurological:  Positive for headaches. Negative for speech difficulty and weakness.  Psychiatric/Behavioral:  Negative for confusion.          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HCC)    GERD (gastroesophageal reflux disease)    Heart murmur    Osteopenia    Thrush     Social History   Socioeconomic History   Marital status: Widowed    Spouse name: Francoise Ishihara   Number of children: 3   Years of education: high school   Highest education level: Not on file  Occupational History   Occupation: retired  Tobacco Use   Smoking status: Former    Current packs/day: 0.00    Average packs/day: 1 pack/day for 6.0 years (6.0 ttl pk-yrs)    Types: Cigarettes    Start date: 02/21/1964    Quit date: 02/20/1970    Years since quitting: 53.9   Smokeless tobacco: Never  Vaping Use   Vaping status: Never Used  Substance and Sexual Activity   Alcohol use: Not Currently   Drug use: Never   Sexual activity: Not Currently    Partners: Male    Birth control/protection: Post-menopausal  Other Topics Concern   Not on file  Social History  Narrative      Right handed   Lives in a condo with domestic partner   From [place]      02/12/20   From: [place], moved to be near daugther   Living: with Francoise Ishihara - husband   Health Care Proxy: Madelaine Scarpa (daughter)   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      Family: Actor, lives in [place]), [place] ([place], [place] ), Ethelle Herb ([place])      Enjoys: meet new people, socialable      Exercise:  not currently - cleaning   Diet: picky eater - eats veggies - but her husband doesn't like these things      Safety   Seat belts: Yes    Guns: Yes  and secure   Safe in relationships: Yes    Social Drivers of Health   Financial Resource Strain: Low Risk  (02/18/2021)   Overall Financial Resource Strain (CARDIA)    Difficulty of Paying Living Expenses: Not hard at all  Food Insecurity: No Food Insecurity (02/18/2021)   Hunger Vital Sign    Worried About Running Out of Food in the Last Year: Never true    Ran Out of Food in the Last Year: Never true  Transportation Needs: No Transportation Needs (02/18/2021)   PRAPARE - Administrator, Civil Service (Medical): No    Lack of Transportation (Non-Medical): No  Physical Activity: Inactive (02/18/2021)   Exercise Vital Sign    Days of Exercise per Week: 0 days    Minutes of Exercise per Session: 0 min  Stress: No Stress Concern Present (02/18/2021)   [employer] of Occupational Health - Occupational Stress Questionnaire    Feeling of Stress : Not at all  Social Connections: Not on file  Intimate Partner Violence: Not At Risk (02/18/2021)   Humiliation, Afraid, Rape, and Kick questionnaire    Fear of Current or Ex-Partner: No    Emotionally Abused: No    Physically Abused: No    Sexually Abused: No    Past Surgical History:  Procedure Laterality Date   BONE MARROW TRANSPLANT     BREAST LUMPECTOMY Left    fatty [phone]   CATARACT EXTRACTION     CESAREAN SECTION     COLONOSCOPY WITH PROPOFOL  N/A 10/06/2022   Procedure: COLONOSCOPY WITH PROPOFOL ;  Surgeon: Luke Salaam, MD;  [facility] ENDOSCOPY;  Service: Gastroenterology;  Laterality: N/A;   EYE SURGERY     HYSTEROTOMY     IR BONE MARROW BIOPSY & ASPIRATION  11/24/2023   LYMPH GLAND EXCISION     MENISCUS REPAIR Right    NECK SURGERY     SALPINGECTOMY     WISDOM TOOTH EXTRACTION      Family History  Problem Relation Age of Onset   Retinitis pigmentosa  Mother    Stroke Mother    Liver cancer Father    Heart disease Father    Heart disease Brother    Blindness Brother    Breast cancer Neg Hx     Allergies  Allergen Reactions   Jadenu [Deferasirox] Other (See Comments)    Elevated Iron, headache and rash   Ketotifen Fumarate    Levofloxacin Other (See Comments)   Ondansetron Hcl Other (See Comments)   Codeine Palpitations   Oxycodone-Acetaminophen  Palpitations and Other (See Comments)   Penicillins Rash    Current Outpatient Medications on File Prior to Visit  Medication Sig Dispense Refill   aspirin EC  81 MG tablet Take 81 mg by mouth daily. Swallow whole.     Biotin 1 MG CAPS Take 1,000 mcg by mouth daily.      calcium  carbonate (OS-CAL - DOSED IN MG OF ELEMENTAL CALCIUM ) 1250 (500 Ca) MG tablet Take 1 tablet by mouth daily.     Cholecalciferol (VITAMIN D3) 50 MCG (2000 UT) CAPS Take 4,000 Units by mouth daily.     ciclopirox  (PENLAC ) 8 % solution Apply topically at bedtime. Apply over nail and surrounding skin. Apply daily over previous coat. After seven (7) days, may remove with alcohol and continue cycle. 6.6 mL 0   cyanocobalamin  (VITAMIN B12) 500 MCG tablet Take 500 mcg by mouth daily.     esomeprazole  (NEXIUM ) 20 MG capsule Take 1 capsule (20 mg total) by mouth daily at 12 noon. 90 capsule 3   ibandronate  (BONIVA ) 150 MG tablet TAKE 1 TABLET (150 MG TOTAL) BY MOUTH EVERY 30 (THIRTY) DAYS. 3 tablet 1   ketoconazole  (NIZORAL ) 2 % cream Apply 1 Application topically daily. 15 g 0   magnesium  oxide (MAG-OX) 400 (240 Mg) MG tablet Take 400 mg by mouth daily.     meclizine  (ANTIVERT ) 12.5 MG tablet TAKE 1 TABLET BY MOUTH 3 TIMES DAILY AS NEEDED. 30 tablet 0   meloxicam (MOBIC) 15 MG tablet Take 15 mg by mouth daily.     methocarbamol  (ROBAXIN ) 500 MG tablet Take 500 mg by mouth.     pimecrolimus  (ELIDEL ) 1 % cream Apply under breasts twice daily until itchy rash improved. 30 g 2   vitamin C (ASCORBIC ACID) 250 MG tablet Take  250 mg by mouth daily.     albuterol  (VENTOLIN  HFA) 108 (90 Base) MCG/ACT inhaler Inhale 1-2 puffs into the lungs as needed for wheezing or shortness of breath. (Patient not taking: Reported on 01/14/2024) 6.7 g 1   No current facility-administered medications on file prior to visit.    BP (!) 154/82   Pulse 78   Temp (!) 97.3 F (36.3 C) (Temporal)   Ht 5\' 2"  (1.575 m)   Wt 140 lb (63.5 kg)   SpO2 100%   BMI 25.61 kg/m  Objective:   Physical Exam Eyes:     Extraocular Movements: Extraocular movements intact.  Cardiovascular:     Rate and Rhythm: Normal rate and regular rhythm.  Pulmonary:     Effort: Pulmonary effort is normal.     Breath sounds: Normal breath sounds.  Abdominal:     General: Bowel sounds are normal.     Palpations: Abdomen is soft.     Tenderness: There is no abdominal tenderness.  Musculoskeletal:     Cervical back: Neck supple.  Skin:    General: Skin is warm and dry.  Neurological:     Mental Status: She is alert and oriented to person, place, and time.     Cranial Nerves: No cranial nerve deficit.     Coordination: Coordination normal.  Psychiatric:        Mood and Affect: Mood normal.           Assessment & Plan:  Imbalance Assessment & Plan: Along with several other potentially neurological symptoms. Neuroexam today reassuring.  She participated in HPI well for the most part. Reviewed MRI cervical spine from April 2025 which could certainly be contributing to some of her symptoms.  We had a long discussion today regarding her prior hospital admission, current symptoms, and our plan moving forward.  Urinalysis today negative. CBC  and BMP pending.  Discussed to stop meloxicam and methocarbamol . Recommended family attend appointments with patient for a better understanding of the plan.   Close follow-up with PCP if symptoms do not improve.  Orders: -     POCT Urinalysis Dipstick (Automated) -     CBC -     Basic metabolic panel with  GFR  Headache disorder Assessment & Plan: Reviewed MRI cervical spine, and it is quite possible that headaches are related to her chronic degenerative disc disease/cervical spinal stenosis. Neuroexam today reassuring.   She did have an elevated BP reading today, also on recheck. No prior history of hypertension.  Will have her discontinue NSAIDs Her daughter will monitor her blood pressure at home.  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   Cervical spinal stenosis Assessment & Plan: Reviewed MRI, April 2025 per [facility].  Etiology of some of her symptoms could be secondary to the status of her cervical spine. Recommended that she follow back up with orthopedics for a plan moving forward. Discontinue methocarbamol  and meloxicam.         Melesa Lecy K Milena Liggett, NP

## 2024-01-14 NOTE — Assessment & Plan Note (Signed)
 Reviewed MRI, April 2025 per [facility].  Etiology of some of her symptoms could be secondary to the status of her cervical spine. Recommended that she follow back up with orthopedics for a plan moving forward. Discontinue methocarbamol  and meloxicam.

## 2024-01-14 NOTE — Patient Instructions (Signed)
 Stop by the lab prior to leaving today. I will notify you of your results once received.   Stop taking Meloxicam and methocarbamol  medications.   Urine test was without signs of infection.   Please follow up with Tabitha if no improvement in symptoms.   It was a pleasure meeting you!

## 2024-01-15 LAB — CBC
HCT: 38.2 % (ref 35.0–45.0)
Hemoglobin: 11.7 g/dL (ref 11.7–15.5)
MCH: 23.6 pg — ABNORMAL LOW (ref 27.0–33.0)
MCHC: 30.6 g/dL — ABNORMAL LOW (ref 32.0–36.0)
MCV: 77 fL — ABNORMAL LOW (ref 80.0–100.0)
MPV: 10.4 fL (ref 7.5–12.5)
Platelets: 246 10*3/uL (ref 140–400)
RBC: 4.96 10*6/uL (ref 3.80–5.10)
RDW: 15 % (ref 11.0–15.0)
WBC: 7.6 10*3/uL (ref 3.8–10.8)

## 2024-01-15 LAB — BASIC METABOLIC PANEL WITH GFR
BUN: 14 mg/dL (ref 7–25)
CO2: 24 mmol/L (ref 20–32)
Calcium: 9.1 mg/dL (ref 8.6–10.4)
Chloride: 107 mmol/L (ref 98–110)
Creat: 0.78 mg/dL (ref 0.60–0.95)
Glucose, Bld: 85 mg/dL (ref 65–99)
Potassium: 4.2 mmol/L (ref 3.5–5.3)
Sodium: 141 mmol/L (ref 135–146)
eGFR: 76 mL/min/{1.73_m2} (ref >=60)

## 2024-02-07 ENCOUNTER — Encounter: Payer: Self-pay | Admitting: Family

## 2024-02-07 ENCOUNTER — Ambulatory Visit: Payer: Self-pay | Admitting: Family

## 2024-02-07 ENCOUNTER — Ambulatory Visit (INDEPENDENT_AMBULATORY_CARE_PROVIDER_SITE_OTHER)
Admission: RE | Admit: 2024-02-07 | Discharge: 2024-02-07 | Disposition: A | Discharge status: Home / Self Care | Source: Ambulatory Visit | Attending: Family | Admitting: Family

## 2024-02-07 ENCOUNTER — Ambulatory Visit (INDEPENDENT_AMBULATORY_CARE_PROVIDER_SITE_OTHER): Payer: Medicare HMO | Admitting: Family

## 2024-02-07 VITALS — BP 112/70 | HR 82 | Temp 98.4°F | Ht 63.0 in | Wt 137.4 lb

## 2024-02-07 DIAGNOSIS — Z136 Encounter for screening for cardiovascular disorders: Secondary | ICD-10-CM

## 2024-02-07 DIAGNOSIS — K5903 Drug induced constipation: Secondary | ICD-10-CM | POA: Diagnosis not present

## 2024-02-07 DIAGNOSIS — Z860101 Personal history of adenomatous and serrated colon polyps: Secondary | ICD-10-CM | POA: Insufficient documentation

## 2024-02-07 DIAGNOSIS — J029 Acute pharyngitis, unspecified: Secondary | ICD-10-CM

## 2024-02-07 DIAGNOSIS — Z862 Personal history of diseases of the blood and blood-forming organs and certain disorders involving the immune mechanism: Secondary | ICD-10-CM | POA: Insufficient documentation

## 2024-02-07 DIAGNOSIS — Z8673 Personal history of transient ischemic attack (TIA), and cerebral infarction without residual deficits: Secondary | ICD-10-CM

## 2024-02-07 DIAGNOSIS — Z0001 Encounter for general adult medical examination with abnormal findings: Secondary | ICD-10-CM | POA: Diagnosis not present

## 2024-02-07 DIAGNOSIS — G459 Transient cerebral ischemic attack, unspecified: Secondary | ICD-10-CM

## 2024-02-07 DIAGNOSIS — R062 Wheezing: Secondary | ICD-10-CM | POA: Diagnosis not present

## 2024-02-07 DIAGNOSIS — R7303 Prediabetes: Secondary | ICD-10-CM | POA: Diagnosis not present

## 2024-02-07 DIAGNOSIS — Z0000 Encounter for general adult medical examination without abnormal findings: Secondary | ICD-10-CM

## 2024-02-07 DIAGNOSIS — I700 Atherosclerosis of aorta: Secondary | ICD-10-CM

## 2024-02-07 DIAGNOSIS — J22 Unspecified acute lower respiratory infection: Secondary | ICD-10-CM

## 2024-02-07 DIAGNOSIS — N182 Chronic kidney disease, stage 2 (mild): Secondary | ICD-10-CM

## 2024-02-07 LAB — CBC WITH DIFFERENTIAL/PLATELET
Basophils Absolute: 0 10*3/uL (ref 0.0–0.1)
Basophils Relative: 0.3 % (ref 0.0–3.0)
Eosinophils Absolute: 0.1 10*3/uL (ref 0.0–0.7)
Eosinophils Relative: 1.2 % (ref 0.0–5.0)
HCT: 37.9 % (ref 36.0–46.0)
Hemoglobin: 11.8 g/dL — ABNORMAL LOW (ref 12.0–15.0)
Lymphocytes Relative: 16.8 % (ref 12.0–46.0)
Lymphs Abs: 2 10*3/uL (ref 0.7–4.0)
MCHC: 31.2 g/dL (ref 30.0–36.0)
MCV: 74.8 fl — ABNORMAL LOW (ref 78.0–100.0)
Monocytes Absolute: 1 10*3/uL (ref 0.1–1.0)
Monocytes Relative: 8.1 % (ref 3.0–12.0)
Neutro Abs: 8.6 10*3/uL — ABNORMAL HIGH (ref 1.4–7.7)
Neutrophils Relative %: 73.6 % (ref 43.0–77.0)
Platelets: 231 10*3/uL (ref 150.0–400.0)
RBC: 5.06 Mil/uL (ref 3.87–5.11)
RDW: 15.2 % (ref 11.5–15.5)
WBC: 11.7 10*3/uL — ABNORMAL HIGH (ref 4.0–10.5)

## 2024-02-07 LAB — LIPID PANEL
Cholesterol: 155 mg/dL (ref 0–200)
HDL: 52.9 mg/dL (ref >39.00)
LDL Cholesterol: 84 mg/dL (ref 0–99)
NonHDL: 102.37
Total CHOL/HDL Ratio: 3
Triglycerides: 94 mg/dL (ref 0.0–149.0)
VLDL: 18.8 mg/dL (ref 0.0–40.0)

## 2024-02-07 LAB — POCT RAPID STREP A (OFFICE): Rapid Strep A Screen: NEGATIVE

## 2024-02-07 LAB — HEMOGLOBIN A1C: Hgb A1c MFr Bld: 6.1 % (ref 4.6–6.5)

## 2024-02-07 MED ORDER — DOXYCYCLINE HYCLATE 100 MG PO TABS: 100.0000 mg | ORAL_TABLET | Freq: Two times a day (BID) | ORAL | 0 refills | Status: AC

## 2024-02-07 MED ORDER — IRON (FERROUS SULFATE) 325 (65 FE) MG PO TABS: 325.0000 mg | ORAL_TABLET | Freq: Every day | ORAL | Status: DC

## 2024-02-07 NOTE — Progress Notes (Signed)
 Slight anemia but no more than usual.  I am sending in an antibiotic as there was some white blood cell elevation in case there is an infectious process.  However the chest x-ray was reassuring, the strep is negative.  You are still prediabetic with a slight increase from 6 months ago from 6.0-6.1.  Be sure to work on your diet which she did admit sometimes for an focus on a diabetic type diet.  Your cholesterol however looks pretty great so good job.

## 2024-02-07 NOTE — Assessment & Plan Note (Signed)
Pt advised of the following: Work on a diabetic diet, try to incorporate exercise at least 20-30 a day for 3 days a week or more.  Ordering A1c pending results. 

## 2024-02-07 NOTE — Assessment & Plan Note (Signed)

## 2024-02-07 NOTE — Assessment & Plan Note (Signed)
 Stop benefiber start otc colace  Increase water  intake.  Iron likely contributing

## 2024-02-07 NOTE — Addendum Note (Signed)
 Addended by: Dewanda Foots C on: 02/07/2024 10:17 AM   Modules accepted: Orders

## 2024-02-07 NOTE — Progress Notes (Signed)
 Strep was negative.  Chest xray was without acute issue.  White blood cells are however elevated so I am going to send in an antibiotic.  I am sending in doxycycline .

## 2024-02-07 NOTE — Progress Notes (Addendum)
 Subjective:  Patient ID: Roberta Curtis, female    DOB: 1941-01-11  Age: 83 y.o. MRN: [id number]  Patient Care Team: Corwin Antu, FNP as PCP - General (Family Medicine) Darliss Rogue, MD as PCP - Cardiology (Cardiology) Consuelo Marchi (Oncology) [facility] (Cardiology) Alethea [facility] Babara Call, MD as Consulting Physician (Hematology and Oncology)   CC:  Chief Complaint  Patient presents with   Annual Exam    HPI Roberta Curtis is a 83 y.o. female who presents today for an annual physical exam. She reports consuming a general diet. The patient does not participate in regular exercise at present. She generally feels well. She reports sleeping fairly well. She does have additional problems to discuss today.   Vision:Within last year Dental:Receives regular dental care  Mammogram: 09/2023 negative  Last pap: > 60 y/o  Colonoscopy: > 73 y/o last 10/06/22 per report no longer needed  Bone density scan: 08/12/23 osteopenia , she is on boniva .   Pt is with acute concerns.   Cold sore started three days ago on her left upper lip.yesterday lost her voice and her throat was sore. This am started to cough up thick green phlegm and her chest feels tight.   Trouble with having bowel movements. Colonoscopy reviewed 2024. No repeat needed. Whenever she has to go to the bathroom she feels lightheaded until she has the bowel movement. Often constipated. She takes benefiber and also miralax  and drinks one cup of coffee a day but not always helpful. She has picked up a stool softener but has not yet started it. She started taking prunes just this am to see if this helps. She does take constipation once daily.   Imbalance: she was seen in office with Mallie Gaskins, NP last week, and neuroexam was reassuring. MRI cervical spine reviewed, u/a was negative cbc and bmp were ordered which were normal limits. She was advised to stop  meloxicam and also methocarbamol  with relief of imbalance symptoms.    Advanced Directives Patient does have advanced directives    DEPRESSION SCREENING    02/07/2024    9:43 AM 02/22/2023   10:26 AM 02/04/2023    9:39 AM 09/25/2022    8:09 AM 08/06/2022    9:46 AM 02/18/2021    8:24 AM 09/05/2020    4:13 PM  PHQ 2/9 Scores  PHQ - 2 Score 0 0 0 0 0 0 0  PHQ- 9 Score 0 0 0 0 0 0      ROS: Negative unless specifically indicated above in HPI.    Current Outpatient Medications:    Iron , Ferrous Sulfate , 325 (65 Fe) MG TABS, Take 325 mg by mouth daily., Disp: , Rfl:    albuterol  (VENTOLIN  HFA) 108 (90 Base) MCG/ACT inhaler, Inhale 1-2 puffs into the lungs as needed for wheezing or shortness of breath., Disp: 6.7 g, Rfl: 1   aspirin EC 81 MG tablet, Take 81 mg by mouth daily. Swallow whole., Disp: , Rfl:    Biotin 1 MG CAPS, Take 1,000 mcg by mouth daily. , Disp: , Rfl:    calcium  carbonate (OS-CAL - DOSED IN MG OF ELEMENTAL CALCIUM ) 1250 (500 Ca) MG tablet, Take 1 tablet by mouth daily., Disp: , Rfl:    Cholecalciferol (VITAMIN D3) 50 MCG (2000 UT) CAPS, Take 4,000 Units by mouth daily., Disp: , Rfl:    cyanocobalamin  (VITAMIN B12) 500 MCG tablet, Take 500 mcg by mouth daily., Disp: , Rfl:  esomeprazole  (NEXIUM ) 20 MG capsule, Take 1 capsule (20 mg total) by mouth daily at 12 noon., Disp: 90 capsule, Rfl: 3   ibandronate  (BONIVA ) 150 MG tablet, TAKE 1 TABLET (150 MG TOTAL) BY MOUTH EVERY 30 (THIRTY) DAYS., Disp: 3 tablet, Rfl: 1   ketoconazole  (NIZORAL ) 2 % cream, Apply 1 Application topically daily., Disp: 15 g, Rfl: 0   magnesium  oxide (MAG-OX) 400 (240 Mg) MG tablet, Take 400 mg by mouth daily., Disp: , Rfl:    meclizine  (ANTIVERT ) 12.5 MG tablet, TAKE 1 TABLET BY MOUTH 3 TIMES DAILY AS NEEDED., Disp: 30 tablet, Rfl: 0   vitamin C (ASCORBIC ACID) 250 MG tablet, Take 250 mg by mouth daily., Disp: , Rfl:     Objective:    BP 112/70 (BP Location: Left Arm, Patient Position: Sitting,  Cuff Size: Normal)   Pulse 82   Temp 98.4 F (36.9 C) (Temporal)   Ht 5' 3 (1.6 m)   Wt 137 lb 6.4 oz (62.3 kg)   SpO2 98%   BMI 24.34 kg/m   BP Readings from Last 3 Encounters:  02/07/24 112/70  01/14/24 (!) 154/82  12/07/23 124/65      Physical Exam Vitals reviewed.  Constitutional:      General: She is not in acute distress.    Appearance: Normal appearance. She is normal weight. She is not ill-appearing.  HENT:     Head: Normocephalic.     Right Ear: Tympanic membrane normal.     Left Ear: Tympanic membrane normal.     Nose: Nose normal.     Mouth/Throat:     Mouth: Mucous membranes are moist.  Eyes:     Extraocular Movements: Extraocular movements intact.     Pupils: Pupils are equal, round, and reactive to light.  Cardiovascular:     Rate and Rhythm: Normal rate and regular rhythm.  Pulmonary:     Effort: Pulmonary effort is normal.     Breath sounds: Decreased air movement (left lower lobe) present. Examination of the left-lower field reveals wheezing. Wheezing present.  Abdominal:     General: Abdomen is flat. Bowel sounds are normal.     Palpations: Abdomen is soft.     Tenderness: There is no guarding or rebound.  Musculoskeletal:        General: Normal range of motion.     Cervical back: Normal range of motion.  Skin:    General: Skin is warm.     Capillary Refill: Capillary refill takes less than 2 seconds.  Neurological:     General: No focal deficit present.     Mental Status: She is alert.  Psychiatric:        Mood and Affect: Mood normal.        Behavior: Behavior normal.        Thought Content: Thought content normal.        Judgment: Judgment normal.          Assessment & Plan:  History of anemia -     Iron  (Ferrous Sulfate ); Take 325 mg by mouth daily. -     CBC with Differential/Platelet  Encounter for general adult medical examination with abnormal findings Assessment & Plan: Patient Counseling(The following topics were  reviewed):  Preventative care handout given to pt  Health maintenance and immunizations reviewed. Please refer to Health maintenance section. Pt advised on safe sex, wearing seatbelts in car, and proper nutrition labwork ordered today for annual Dental health: Discussed importance of regular tooth  brushing, flossing, and dental visits.    Drug-induced constipation Assessment & Plan: Stop benefiber start otc colace  Increase water  intake.  Iron  likely contributing   Expiratory wheezing on left side of chest -     DG Chest 2 View; Future -     CBC with Differential/Platelet  Screening for cardiovascular condition  Prediabetes Assessment & Plan: Pt advised of the following: Work on a diabetic diet, try to incorporate exercise at least 20-30 a day for 3 days a week or more.  Ordering A1c pending results   Orders: -     Hemoglobin A1c  CKD (chronic kidney disease) stage 2, GFR 60-89 ml/min Assessment & Plan: Bmp ordered today pending results.    History of adenomatous polyp of colon  Sore throat -     POCT rapid strep A  Aortic 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HCC) -     Lipid panel  Questionable TIA (transient ischemic attack) -     Lipid panel    Addendum made 7/21 to update dx code for ordering of lipid panel of which this is indicated as pt with aortic atherosclerosis and also h/o TIA.   Follow-up: Return in about 2 weeks (around 02/21/2024) for follow up wheezing.   Ginger Patrick, FNP r

## 2024-02-07 NOTE — Assessment & Plan Note (Signed)
Bmp ordered today pending results.

## 2024-02-08 ENCOUNTER — Telehealth: Payer: Self-pay | Admitting: Family

## 2024-02-08 NOTE — Telephone Encounter (Signed)
 Copied from CRM [id number]. Topic: Clinical - Prescription Issue >> Feb 07, 2024  5:22 PM Chuck Crater wrote: Reason for CRM: Patient stated that pharmacy doesn't have doxycycline  (VIBRA -TABS) 100 MG tablet ready for her. >> Feb 07, 2024  5:37 PM [place] J wrote: Patient calling back in regards to her doxycycline  (VIBRA -TABS) 100 MG tablet. I let her know that we will let her provider know so we can attempt to resend medication.

## 2024-02-08 NOTE — Telephone Encounter (Signed)
 Spoke with pt and she states that [facility] did in fact receive her prescription and she picked it up.

## 2024-03-17 ENCOUNTER — Ambulatory Visit (INDEPENDENT_AMBULATORY_CARE_PROVIDER_SITE_OTHER): Admitting: Podiatrist

## 2024-03-17 ENCOUNTER — Ambulatory Visit: Admitting: Podiatrist

## 2024-03-17 ENCOUNTER — Encounter: Payer: Self-pay | Admitting: Podiatrist

## 2024-03-17 DIAGNOSIS — G5751 Tarsal tunnel syndrome, right lower limb: Secondary | ICD-10-CM | POA: Diagnosis not present

## 2024-03-17 DIAGNOSIS — M722 Plantar fascial fibromatosis: Secondary | ICD-10-CM | POA: Diagnosis not present

## 2024-03-17 NOTE — Patient Instructions (Signed)
 Tarsal Tunnel Syndrome  Tarsal tunnel syndrome is a condition that happens when a nerve called the tibial nerve is irritated or squeezed (compressed) as it passes through an area on the inside of your ankle (tarsal tunnel). The tarsal tunnel is a narrow passage through the connective tissue and bones in your feet (tarsals). The tibial nerve passes behind the large bony bump in your inner ankle and branches out into your foot and toes. This nerve enables feeling by passing signals to your heel, the bottom of your foot, and some of your toe muscles. Tarsal tunnel syndrome usually causes ankle and foot pain that gets worse with activity. What are the causes? Tarsal tunnel syndrome can be caused by any condition that makes the space in the tarsal tunnel more narrow. Athletes may get tarsal tunnel syndrome from a broken (fractured) ankle or from an ankle sprain that causes scarring or swelling. Other common causes include: Overpronation. This is when your feet roll inward and flatten too much when you stand, walk, or run. Extra pressure on the tarsal tunnel area from tight or stiff shoes or boots. Less room in the tarsal tunnel due to small, fluid-filled sacs (cysts) or growths on the bones near the tunnel. What increases the risk? This condition is more likely to develop in people who: Play sports where they wear high, stiff boots, such as downhill skiing. Play sports with repetitive motion, such as running. Play sports on uneven surfaces that can lead to a sprained ankle, such as soccer or football. Have had an inner ankle injury. Have flat feet. Have other conditions, such as diabetes, hypothyroidism, or rheumatoid arthritis. What are the signs or symptoms? Symptoms of this condition include: Burning pain behind the ankle, in the heel, or in the foot. This pain gets worse if you are standing, walking, or running. Numbness or a prickling and tingling feeling in your heel, foot, or toes. Swelling in  your ankle or heel. At first, your symptoms may get worse with activity and be better with rest. Over time, your symptoms may become constant or come on sooner with less activity. How is this diagnosed? This condition is diagnosed based on: Your symptoms and your medical history. A physical exam. Your health care provider may tap on the area below your ankle to check for tingling in your foot or toes. You may also have other tests, including: X-rays to check bone structure. An MRI or ultrasound to examine the nerve and tendon area to find where your nerve is getting compressed. A study of nerve function (electromyography or EMG). How is this treated? Treatment may include: Wearing a removable splint or boot for ankle support. Using a shoe insert (orthotic) to help support your arch. Taking NSAIDs to reduce pain. Using ice to reduce swelling. Having medicine injected into your ankle joint to reduce pain and swelling. Starting range-of-motion exercises and strengthening exercises. Slowly returning to full activity. The timing will depend on the severity of your condition and your response to treatment. Surgery. Follow these instructions at home: If you have a removable splint or boot: Wear the splint or boot as told by your provider. Remove it only as told by your provider. Check the skin around the splint or boot every day. Tell your provider about any concerns. Loosen the splint or boot if your toes tingle, become numb, or turn cold and blue. Keep the splint or boot clean. If your splint or boot is not waterproof: Do not let it get wet.  Cover it with a watertight covering when you take a bath or shower. Ask your provider when it is safe to drive if your splint or boot is on a foot that you use for driving. Managing pain, stiffness, and swelling  If told, put ice on the injured area. If you have a removable splint or boot, remove it as told by your provider. Put ice in a plastic  bag. Place a towel between your skin and the bag. Leave the ice on for 20 minutes, 2-3 times a day. If your skin turns bright red, remove the ice right away to prevent skin damage. The risk of damage is higher if you cannot feel pain, heat, or cold. Move your toes often to reduce stiffness and swelling. Raise (elevate) your foot above the level of your heart while you are sitting or lying down. Activity Do exercises as told by your provider. Return to your normal activities as told by your provider. Ask your provider what activities are safe for you. General instructions Take over-the-counter and prescription medicines only as told by your provider. Do not use any products that contain nicotine or tobacco. These products include cigarettes, chewing tobacco, and vaping devices, such as e-cigarettes. If you need help quitting, ask your provider. Keep all follow-up visits. Your provider will monitor your healing. How is this prevented? Give your body time to rest between activities. Make sure to wear supportive and comfortable shoes during athletic activity. Do not overtighten ski boots or the laces on high-top shoes. Be safe and responsible while being active to avoid falls. Contact a health care provider if: Your ankle pain is not getting better. You are unable to support body weight on your foot without pain. This information is not intended to replace advice given to you by your health care provider. Make sure you discuss any questions you have with your health care provider. Document Revised: 05/26/2022 Document Reviewed: 05/26/2022 Elsevier Patient Education  2024 [employer].

## 2024-03-20 ENCOUNTER — Telehealth: Payer: Self-pay | Admitting: Family

## 2024-03-20 ENCOUNTER — Encounter: Payer: Self-pay | Admitting: Podiatrist

## 2024-03-20 NOTE — Telephone Encounter (Signed)
 Copied from CRM [id number]. Topic: General - Other >> Mar 20, 2024  3:34 PM Berneda F wrote: Reason for CRM: Pt states she was charged $50 for a Lipid panel because [facility] said she is only supposed to have a lipid panel once every 5 years. She does not have any other reason to have this panel done health-wise and [employer] states this is something NP Corwin should have caught before having her do it unless it was medically necessary.   Patient states she is going to file an appeal for this unless we can adjust this bill for her as this is not her fault.  Please call patient back at [phone]

## 2024-03-20 NOTE — Progress Notes (Signed)
 Chief Complaint  Patient presents with   Numbness    Patient states she is having numbness at the Plantar of left foot, patient states this has been going on for a few weeks now and that in is in the left arch of left foot. Patient PCP told patient that she has Plantar Fasciitis.       HPI: Patient is 83 y.o. female who presents today for concerns as listed above.  History confirmed with patient.  She presents today with her daughter.  Relates pain in the heel of the left foot that radiates numbness dorsally to the top of her foot.  Relates she has tried massaging the foot and saw her primary care provider who diagnosed her with plantar fasciitis.    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   Bunion, left foot 08/09/2023   Mitral valve prolapse 02/26/2023   Irregular heart beat 02/22/2023   Abnormal EKG 02/22/2023   Hypocalcemia 10/12/2022   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   Other emphysema (HCC) 08/06/2022   Left-sided sensory deficit present    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   B12 deficiency 02/12/2022   Headache disorder 02/12/2022   Change in bowel habits 02/12/2022   Neurological deficit present    Questionable TIA (transient ischemic attack) 02/08/2022   Left-sided weakness 02/08/2022   Hypochromic-microcytic anemia 02/08/2022   Dizziness 01/08/2022   Vertigo 01/08/2022   Aortic 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HCC) 12/22/2021   Neuropathy 11/05/2021   Urge incontinence 09/05/2020    Chronic nausea 08/13/2020   Chronic bilateral low back pain without sciatica 05/07/2020   Mild diastolic dysfunction 02/14/2020   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 6.7 g 1   aspirin EC 81 MG tablet Take 81 mg by mouth daily. Swallow whole.     Biotin 1 MG CAPS Take 1,000 mcg by mouth daily.      calcium  carbonate (OS-CAL - DOSED IN MG OF ELEMENTAL CALCIUM ) 1250 (500 Ca) MG tablet Take 1 tablet by mouth daily.     Cholecalciferol (VITAMIN D3) 50 MCG (2000 UT) CAPS Take 4,000 Units by mouth daily.     cyanocobalamin  (VITAMIN B12) 500 MCG tablet Take 500 mcg by mouth daily.     esomeprazole  (NEXIUM ) 20 MG capsule Take 1 capsule (20 mg total) by mouth daily at 12 noon. 90 capsule 3   ibandronate  (BONIVA ) 150 MG tablet TAKE 1 TABLET (150 MG TOTAL) BY MOUTH EVERY 30 (THIRTY) DAYS. 3 tablet 1   Iron , Ferrous Sulfate , 325 (65 Fe) MG TABS Take 325 mg by mouth daily.     ketoconazole  (NIZORAL ) 2 % cream Apply 1 Application  topically daily. 15 g 0   magnesium  oxide (MAG-OX) 400 (240 Mg) MG tablet Take 400 mg by mouth daily.     meclizine  (ANTIVERT ) 12.5 MG tablet TAKE 1 TABLET BY MOUTH 3 TIMES DAILY AS NEEDED. 30 tablet 0   vitamin C (ASCORBIC ACID) 250 MG tablet Take 250 mg by mouth daily.     No current facility-administered medications on file prior to visit.    Allergies  Allergen Reactions   Jadenu [Deferasirox] Other (See Comments)    Elevated Iron , headache and rash   Ketotifen Fumarate    Levofloxacin Other (See Comments)   Ondansetron Hcl Other (See  Comments)   Codeine Palpitations   Meloxicam Other (See Comments)    dizziness   Oxycodone-Acetaminophen  Palpitations and Other (See Comments)   Penicillins Rash   Robaxin  [Methocarbamol ] Other (See Comments)    dizziness    Review of Systems No fevers, chills, nausea, muscle aches, no difficulty breathing, no calf pain, no chest pain or shortness of breath.   Physical Exam  GENERAL APPEARANCE: Alert, conversant. Appropriately groomed. No acute distress.   VASCULAR: Pedal pulses palpable 2/4 DP and PT bilateral.  Capillary refill time is immediate to all digits,  Proximal to distal cooling is warm to warm.  Digital perfusion adequate.   NEUROLOGIC: sensation is intact to 5.07 monofilament at 5/5 sites bilateral.  Light touch is intact bilateral, vibratory sensation intact bilateral.  Some tingling noted along the proximal portion of the tarsal canal.    MUSCULOSKELETAL: acceptable muscle strength, tone and stability bilateral.  Pain with direct pressure at medial insertion of plantar fascia noted.   DERMATOLOGIC: skin is warm, supple, and dry.  Color, texture, and turgor of skin within normal limits.  No open wounds are noted.  No preulcerative lesions are seen.  Digital nails are asymptomatic.     Assessment   Plantar fasciitis vs. Plantar nerve entrapment/ pain right foot.   Plan  Discussed exam findings and recommended trying an injection of steroid to the area of pain to see if this helps decrease her symptoms.  She agreed and this was carried out today.  Recommended supportive shoes and ice.  She will be reappointed to see Dr. Verta in [place] in 3-4 weeks

## 2024-03-27 ENCOUNTER — Telehealth: Payer: Self-pay | Admitting: Family

## 2024-03-27 NOTE — Telephone Encounter (Signed)
 Advised the $50 for the lipid panel is being taken care of and she will not be responsible for it per Rumalda Fish.  She might get another statement before it's resolved.  Patient understood and was happy.

## 2024-03-28 ENCOUNTER — Ambulatory Visit: Admitting: Podiatry

## 2024-04-12 ENCOUNTER — Ambulatory Visit: Admitting: Podiatry

## 2024-04-14 ENCOUNTER — Other Ambulatory Visit: Payer: Self-pay | Admitting: Family

## 2024-04-14 DIAGNOSIS — M8580 Other specified disorders of bone density and structure, unspecified site: Secondary | ICD-10-CM

## 2024-04-17 ENCOUNTER — Ambulatory Visit: Admitting: Podiatry

## 2024-04-19 ENCOUNTER — Ambulatory Visit: Admitting: Podiatry

## 2024-05-01 ENCOUNTER — Ambulatory Visit (INDEPENDENT_AMBULATORY_CARE_PROVIDER_SITE_OTHER): Admitting: Podiatry

## 2024-05-01 DIAGNOSIS — L603 Nail dystrophy: Secondary | ICD-10-CM

## 2024-05-01 DIAGNOSIS — G609 Hereditary and idiopathic neuropathy, unspecified: Secondary | ICD-10-CM

## 2024-05-01 NOTE — Progress Notes (Signed)
 She presents with her daughter today for follow-up of steroid injection to her left foot.  She states that I would like to have my toenails cut.  Her daughter Roberta Curtis is with her explaining that they have been to do mended in the hospital for a period of at least 9 days with weakness and symptoms which appeared to resemble stroke.  She has a history of spinal stenosis of the neck but is complaining of numbness to the forefoot weakness of the legs.  Her daughter does state that she is very sedentary drinks 1 drink a day such as a small Gatorade and does not ambulate generally.  Objective: Vital signs are stable alert and oriented x 3.  Pulses are strongly palpable musculature is slightly weak and 4 out of 5 dorsiflexors plantar flexors inverters everters.  She has some early loss of protective sensation with Semmes Weinstein monofilament particularly to the forefoot bilateral.  Cutaneous evaluation is supple well-hydrated toenails are long thick dystrophic possibly mycotic.  Assessment: After speaking to her daughter and learning much more about Roberta Curtis it really seems to boil down to the fact that she is sedentary has a history of spinal stenosis seems to be developing some neuropathy possibly idiopathic possibly associated with medications or low electrolytes.  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  Plan: I debrided her toenails for her today.  I discussed in great detail with her and her daughter that I feel that this is primarily lack of mobility and the cramping is from electrolyte depletion.  I expressed my concerns for possible kidney issues in the future with the lack of hydration.  I expressed that any ambulation and exercise would be better than nothing.  At this point she is going to talk with primary care to draw labs looking for electrolyte vitamins and kidney function.  This does not appear to be claudication or any type of typical neuropathy

## 2024-06-07 ENCOUNTER — Ambulatory Visit: Admitting: Oncology

## 2024-06-07 ENCOUNTER — Other Ambulatory Visit

## 2024-06-08 ENCOUNTER — Other Ambulatory Visit: Payer: Self-pay | Admitting: Family

## 2024-07-17 ENCOUNTER — Ambulatory Visit: Payer: Self-pay

## 2024-07-17 NOTE — Telephone Encounter (Signed)
 I spoke with pt; pt said wheezing began on 07/16/24; pt said the wheezing is not bad. No fever; temp this AM was 98.2. Pt said she has no way to ck BP or P  pt said  she does not feel like her heart is beating fast. No SOB at this time. Pt said she does not need to go to UC or ED at this time. Pt wants to keep appt already made 07/18/24 at 12:40 and UC &ED precautions given and pt voiced understanding. Sending to ONEIDA Patrick FNP.

## 2024-07-17 NOTE — Telephone Encounter (Signed)
 FYI Only or Action Required?: FYI only for provider: appointment scheduled on 07/18/24. Pt declines same day appt with alt provider, requests PCP only.  Patient was last seen in primary care on 02/07/2024 by Corwin Antu, FNP.  Called Nurse Triage reporting Wheezing.  Symptoms began several days ago.  Interventions attempted: OTC medications: OTC allergy, Tylenol .  Symptoms are: gradually worsening.  Triage Disposition: See Physician Within 24 Hours (overriding See PCP When Office is Open (Within 3 Days))  Patient/caregiver understands and will follow disposition?: Yes     Copied from CRM #[id number]. Topic: Clinical - Red Word Triage >> Jul 17, 2024  8:18 AM Deaijah H wrote: Red Word that prompted transfer to Nurse Triage: Sneezing/runny nose and now scratchy throat. Starting to wheeze. Reason for Disposition  [1] MODERATE longstanding difficulty breathing (e.g., speaks in phrases, SOB even at rest, pulse 100-120) AND [2] SAME as normal  Answer Assessment - Initial Assessment Questions 1. RESPIRATORY STATUS: Describe your breathing? (e.g., wheezing, shortness of breath, unable to speak, severe coughing)      Mild wheeze 2. ONSET: When did this breathing problem begin?      Saturday 3. PATTERN Does the difficult breathing come and go, or has it been constant since it started?      Constant 4. SEVERITY: How bad is your breathing? (e.g., mild, moderate, severe)      Mild 8. CAUSE: What do you think is causing the breathing problem?      Pt believes she may be getting bronchitis 9. OTHER SYMPTOMS: Do you have any other symptoms? (e.g., chest pain, cough, dizziness, fever, runny nose)     Fatigue, scratchy throat, sneezing, runny nose  Protocols used: Breathing Difficulty-A-AH

## 2024-07-17 NOTE — Telephone Encounter (Signed)
  Her normal heart rate is not between 100-120  If this is the case then she needs to be seen asap not to wait until tomorrow    Wt Readings from Last 3 Encounters:  02/07/24 137 lb 6.4 oz (62.3 kg)  01/14/24 140 lb (63.5 kg)  12/07/23 139 lb 6.4 oz (63.2 kg)   Temp Readings from Last 3 Encounters:  02/07/24 98.4 F (36.9 C) (Temporal)  01/14/24 (!) 97.3 F (36.3 C) (Temporal)  12/07/23 98 F (36.7 C)   BP Readings from Last 3 Encounters:  02/07/24 112/70  01/14/24 (!) 154/82  12/07/23 124/65   Pulse Readings from Last 3 Encounters:  02/07/24 82  01/14/24 78  12/07/23 77

## 2024-07-17 NOTE — Telephone Encounter (Signed)
 Pt has been scheduled with Tabitha on 07/18/24 at 1240.

## 2024-07-17 NOTE — Telephone Encounter (Signed)
 NOTED Will see her as scheduled Thank you Laray

## 2024-07-18 ENCOUNTER — Ambulatory Visit (INDEPENDENT_AMBULATORY_CARE_PROVIDER_SITE_OTHER): Admitting: Family

## 2024-07-18 ENCOUNTER — Encounter: Payer: Self-pay | Admitting: Family

## 2024-07-18 ENCOUNTER — Other Ambulatory Visit: Payer: Self-pay | Admitting: *Deleted

## 2024-07-18 VITALS — BP 122/74 | HR 73 | Temp 98.6°F | Ht 63.0 in | Wt 133.8 lb

## 2024-07-18 DIAGNOSIS — Z20822 Contact with and (suspected) exposure to covid-19: Secondary | ICD-10-CM | POA: Diagnosis not present

## 2024-07-18 DIAGNOSIS — J029 Acute pharyngitis, unspecified: Secondary | ICD-10-CM | POA: Diagnosis not present

## 2024-07-18 LAB — POCT RAPID STREP A (OFFICE): Rapid Strep A Screen: NEGATIVE

## 2024-07-18 LAB — POC COVID19 BINAXNOW: SARS Coronavirus 2 Ag: NEGATIVE

## 2024-07-18 MED ORDER — MECLIZINE HCL 12.5 MG PO TABS: 12.5000 mg | ORAL_TABLET | Freq: Three times a day (TID) | ORAL | 0 refills | Status: AC | PRN

## 2024-07-18 NOTE — Progress Notes (Signed)
 Established Patient Office Visit  Subjective:      CC:  Chief Complaint  Patient presents with   Acute Visit    Reports wheezing, sore throat, runny nose, sneezing.  Denies coughing, fever, chills, body aches.    HPI: Roberta Curtis is a 83 y.o. female presenting on 07/18/2024 for Acute Visit (Reports wheezing, sore throat, runny nose, sneezing.  Denies coughing, fever, chills, body aches.) .  Discussed the use of AI scribe software for clinical note transcription with the patient, who gave verbal consent to proceed.  History of Present Illness Roberta Curtis is an 83 year old female who presents with upper respiratory symptoms and wheezing.  Symptoms began four days ago with rhinorrhea and sneezing. She has been using allergy medication and cold and flu medication for congestion, which provided some relief. The allergy medication alleviated sneezing when taken twice, six hours apart.  She experiences wheezing but no increased dyspnea. She notes a loss of voice and a slightly sore throat that started today, described as 'scratchy', which she attempted to soothe with tea. No ear pain, sinus pressure, or chest discomfort, although her ears feel clogged. No cough.  Her husband is experiencing similar symptoms, including a sore throat that began today. She has not been exposed to anyone with COVID-19 recently and has not received a COVID-19 vaccination.         Social history:  Relevant past medical, surgical, family and social history reviewed and updated as indicated. Interim medical history since our last visit reviewed.  Allergies and medications reviewed and updated.  DATA REVIEWED: CHART IN EPIC     ROS: Negative unless specifically indicated above in HPI.    Current Outpatient Medications:    albuterol  (VENTOLIN  HFA) 108 (90 Base) MCG/ACT inhaler, Inhale 1-2 puffs into the lungs as needed for wheezing or shortness of breath., Disp: 6.7 g, Rfl: 1   aspirin  EC 81 MG tablet, Take 81 mg by mouth daily. Swallow whole., Disp: , Rfl:    Biotin 1 MG CAPS, Take 1,000 mcg by mouth daily. , Disp: , Rfl:    calcium  carbonate (OS-CAL - DOSED IN MG OF ELEMENTAL CALCIUM ) 1250 (500 Ca) MG tablet, Take 1 tablet by mouth daily., Disp: , Rfl:    Cholecalciferol (VITAMIN D3) 50 MCG (2000 UT) CAPS, Take 4,000 Units by mouth daily., Disp: , Rfl:    cyanocobalamin  (VITAMIN B12) 500 MCG tablet, Take 500 mcg by mouth daily., Disp: , Rfl:    Iron , Ferrous Sulfate , 325 (65 Fe) MG TABS, Take 325 mg by mouth daily., Disp: , Rfl:    magnesium  oxide (MAG-OX) 400 (240 Mg) MG tablet, Take 400 mg by mouth daily., Disp: , Rfl:    meclizine  (ANTIVERT ) 12.5 MG tablet, TAKE 1 TABLET BY MOUTH 3 TIMES DAILY AS NEEDED., Disp: 30 tablet, Rfl: 0   vitamin C (ASCORBIC ACID) 250 MG tablet, Take 250 mg by mouth daily., Disp: , Rfl:         Objective:        BP 122/74 (BP Location: Left Arm, Patient Position: Sitting, Cuff Size: Normal)   Pulse 73   Temp 98.6 F (37 C) (Temporal)   Ht 5' 3 (1.6 m)   Wt 133 lb 12.8 oz (60.7 kg)   SpO2 98%   BMI 23.70 kg/m   Physical Exam   Wt Readings from Last 3 Encounters:  07/18/24 133 lb 12.8 oz (60.7 kg)  02/07/24 137 lb 6.4 oz (62.3  kg)  01/14/24 140 lb (63.5 kg)    Physical Exam Constitutional:      General: She is not in acute distress.    Appearance: Normal appearance. She is normal weight. She is not ill-appearing, toxic-appearing or diaphoretic.  HENT:     Head: Normocephalic.     Right Ear: Hearing, tympanic membrane, ear canal and external ear normal.     Left Ear: Hearing, tympanic membrane, ear canal and external ear normal.     Nose: Nose normal. No congestion or rhinorrhea.     Right Sinus: No maxillary sinus tenderness or frontal sinus tenderness.     Left Sinus: No maxillary sinus tenderness or frontal sinus tenderness.     Mouth/Throat:     Mouth: Mucous membranes are dry.     Pharynx: Posterior oropharyngeal  erythema and postnasal drip present. No oropharyngeal exudate.     Tonsils: No tonsillar exudate. 0 on the right. 0 on the left.  Eyes:     Extraocular Movements: Extraocular movements intact.     Pupils: Pupils are equal, round, and reactive to light.  Cardiovascular:     Rate and Rhythm: Normal rate and regular rhythm.     Pulses: Normal pulses.     Heart sounds: Normal heart sounds.  Pulmonary:     Effort: Pulmonary effort is normal.     Breath sounds: Normal breath sounds.  Musculoskeletal:     Cervical back: Normal range of motion.  Lymphadenopathy:     Cervical: Cervical adenopathy present.     Right cervical: Superficial cervical adenopathy present.     Left cervical: Superficial cervical adenopathy present.  Neurological:     General: No focal deficit present.     Mental Status: She is alert and oriented to person, place, and time. Mental status is at baseline.  Psychiatric:        Mood and Affect: Mood normal.        Behavior: Behavior normal.        Thought Content: Thought content normal.        Judgment: Judgment normal.          Results   Assessment & Plan:   Assessment and Plan Assessment & Plan Acute upper respiratory infection with laryngitis and wheezing Acute upper respiratory infection with symptoms starting four days ago, including rhinorrhea, sneezing, and wheezing. Mild pharyngitis today. No significant dyspnea or cough. Wheezing likely in the upper airway rather than the lungs. Differential includes viral infection and laryngitis. No recent COVID-19 exposure, but husband has similar symptoms. - Performed strep test which was negative  -Performed covid test which was negative  -Advised patient on supportive measures:  Be sure to rest, drink plenty of fluids, and use tylenol  or ibuprofen as needed for pain. Follow up if fever >101, if symptoms worsen or if symptoms are not improved in 3 days. Patient verbalizes understanding.    Allergic  rhinitis Symptoms of rhinorrhea and sneezing. Symptoms partially relieved by allergy medication. No sinus pressure or otalgia. Ears feel clogged, likely due to allergies.        Return if symptoms worsen or fail to improve.     Ginger Patrick, MSN, APRN, FNP-C [facility]

## 2024-08-08 ENCOUNTER — Encounter: Payer: Self-pay | Admitting: Family

## 2024-08-08 ENCOUNTER — Ambulatory Visit: Admitting: Family

## 2024-08-08 ENCOUNTER — Ambulatory Visit: Payer: Self-pay | Admitting: Family

## 2024-08-08 VITALS — BP 136/72 | HR 101 | Temp 97.9°F | Wt 133.2 lb

## 2024-08-08 DIAGNOSIS — D72829 Elevated white blood cell count, unspecified: Secondary | ICD-10-CM | POA: Insufficient documentation

## 2024-08-08 DIAGNOSIS — Z862 Personal history of diseases of the blood and blood-forming organs and certain disorders involving the immune mechanism: Secondary | ICD-10-CM

## 2024-08-08 DIAGNOSIS — D72828 Other elevated white blood cell count: Secondary | ICD-10-CM | POA: Diagnosis not present

## 2024-08-08 DIAGNOSIS — R7303 Prediabetes: Secondary | ICD-10-CM

## 2024-08-08 DIAGNOSIS — K219 Gastro-esophageal reflux disease without esophagitis: Secondary | ICD-10-CM | POA: Diagnosis not present

## 2024-08-08 DIAGNOSIS — R195 Other fecal abnormalities: Secondary | ICD-10-CM

## 2024-08-08 DIAGNOSIS — C9201 Acute myeloblastic leukemia, in remission: Secondary | ICD-10-CM

## 2024-08-08 LAB — CBC WITH DIFFERENTIAL/PLATELET
Basophils Absolute: 0.1 K/uL (ref 0.0–0.1)
Basophils Relative: 0.9 % (ref 0.0–3.0)
Eosinophils Absolute: 0.1 K/uL (ref 0.0–0.7)
Eosinophils Relative: 2 % (ref 0.0–5.0)
HCT: 36.9 % (ref 36.0–46.0)
Hemoglobin: 12 g/dL (ref 12.0–15.0)
Lymphocytes Relative: 34.3 % (ref 12.0–46.0)
Lymphs Abs: 2.1 K/uL (ref 0.7–4.0)
MCHC: 32.4 g/dL (ref 30.0–36.0)
MCV: 75.6 fl — ABNORMAL LOW (ref 78.0–100.0)
Monocytes Absolute: 0.5 K/uL (ref 0.1–1.0)
Monocytes Relative: 8 % (ref 3.0–12.0)
Neutro Abs: 3.4 K/uL (ref 1.4–7.7)
Neutrophils Relative %: 54.8 % (ref 43.0–77.0)
Platelets: 243 K/uL (ref 150.0–400.0)
RBC: 4.88 Mil/uL (ref 3.87–5.11)
RDW: 15.8 % — ABNORMAL HIGH (ref 11.5–15.5)
WBC: 6.2 K/uL (ref 4.0–10.5)

## 2024-08-08 LAB — BASIC METABOLIC PANEL WITH GFR
BUN: 11 mg/dL (ref 6–23)
CO2: 27 meq/L (ref 19–32)
Calcium: 10.1 mg/dL (ref 8.4–10.5)
Chloride: 105 meq/L (ref 96–112)
Creatinine, Ser: 0.82 mg/dL (ref 0.40–1.20)
GFR: 66.61 mL/min (ref >60.00)
Glucose, Bld: 103 mg/dL — ABNORMAL HIGH (ref 70–99)
Potassium: 4.5 meq/L (ref 3.5–5.1)
Sodium: 141 meq/L (ref 135–145)

## 2024-08-08 LAB — IBC PANEL
Iron: 149 ug/dL — ABNORMAL HIGH (ref 42–145)
Saturation Ratios: 37.3 % (ref 20.0–50.0)
TIBC: 399 ug/dL (ref 250.0–450.0)
Transferrin: 285 mg/dL (ref 212.0–360.0)

## 2024-08-08 LAB — HEMOGLOBIN A1C: Hgb A1c MFr Bld: 5.8 % (ref 4.6–6.5)

## 2024-08-08 LAB — FERRITIN: Ferritin: 27 ng/mL (ref 10.0–291.0)

## 2024-08-08 MED ORDER — ESOMEPRAZOLE MAGNESIUM 20 MG PO CPDR: 20.0000 mg | DELAYED_RELEASE_CAPSULE | Freq: Every day | ORAL | Status: DC

## 2024-08-08 NOTE — Progress Notes (Signed)
 Established Patient Office Visit  Subjective:      CC:  Chief Complaint  Patient presents with   Medical Management of Chronic Issues    HPI: Glennda Weatherholtz is a 83 y.o. female presenting on 08/08/2024 for Medical Management of Chronic Issues .  Discussed the use of AI scribe software for clinical note transcription with the patient, who gave verbal consent to proceed.  History of Present Illness Cecilie Heidel is an 83 year old female who presents for a routine follow-up visit.  She stopped taking metoprolol due to severe heartburn, which was so intense that she thought she was having a heart attack. She also discontinued Boniva  on her dentist's advice due to concerns about its effects on her gums. She had been on Boniva  for osteoporosis for 20-30 years.  She experiences occasional nausea, which she manages with Tums or Pepto-Bismol. Nausea sometimes occurs when she has not had a bowel movement for a couple of days. She uses prunes or prune juice to alleviate constipation, which is not a frequent issue for her.  She has a history of blood in the stool, noted last year after a colonoscopy.  She takes esomeprazole  20 mg once daily in the morning for heartburn, which has improved her symptoms significantly. She describes herself as a 'bad eater' and acknowledges consuming foods that may exacerbate heartburn.  She received a flu shot on October 3rd at [facility]. She has not received the COVID vaccine, expressing personal reservations about it.  Wt Readings from Last 3 Encounters:  08/08/24 133 lb 3.2 oz (60.4 kg)  07/18/24 133 lb 12.8 oz (60.7 kg)  02/07/24 137 lb 6.4 oz (62.3 kg)   Wt Readings from Last 3 Encounters:  08/08/24 133 lb 3.2 oz (60.4 kg)  07/18/24 133 lb 12.8 oz (60.7 kg)  02/07/24 137 lb 6.4 oz (62.3 kg)   Temp Readings from Last 3 Encounters:  08/08/24 97.9 F (36.6 C) (Temporal)  07/18/24 98.6 F (37 C) (Temporal)  02/07/24 98.4 F (36.9 C) (Temporal)    BP Readings from Last 3 Encounters:  08/08/24 136/72  07/18/24 122/74  02/07/24 112/70   Pulse Readings from Last 3 Encounters:  08/08/24 (!) 101  07/18/24 73  02/07/24 82            Social history:  Relevant past medical, surgical, family and social history reviewed and updated as indicated. Interim medical history since our last visit reviewed.  Allergies and medications reviewed and updated.  DATA REVIEWED: CHART IN EPIC     ROS: Negative unless specifically indicated above in HPI.    Current Outpatient Medications:    albuterol  (VENTOLIN  HFA) 108 (90 Base) MCG/ACT inhaler, Inhale 1-2 puffs into the lungs as needed for wheezing or shortness of breath., Disp: 6.7 g, Rfl: 1   aspirin EC 81 MG tablet, Take 81 mg by mouth daily. Swallow whole., Disp: , Rfl:    Biotin 1 MG CAPS, Take 1,000 mcg by mouth daily. , Disp: , Rfl:    calcium  carbonate (OS-CAL - DOSED IN MG OF ELEMENTAL CALCIUM ) 1250 (500 Ca) MG tablet, Take 1 tablet by mouth daily., Disp: , Rfl:    Cholecalciferol (VITAMIN D3) 50 MCG (2000 UT) CAPS, Take 4,000 Units by mouth daily., Disp: , Rfl:    cyanocobalamin  (VITAMIN B12) 500 MCG tablet, Take 500 mcg by mouth daily., Disp: , Rfl:    esomeprazole  (NEXIUM ) 20 MG capsule, Take 1 capsule (20 mg total) by mouth daily at  12 noon., Disp: , Rfl:    Iron , Ferrous Sulfate , 325 (65 Fe) MG TABS, Take 325 mg by mouth daily., Disp: , Rfl:    magnesium  oxide (MAG-OX) 400 (240 Mg) MG tablet, Take 400 mg by mouth daily., Disp: , Rfl:    meclizine  (ANTIVERT ) 12.5 MG tablet, Take 1 tablet (12.5 mg total) by mouth 3 (three) times daily as needed., Disp: 30 tablet, Rfl: 0   vitamin C (ASCORBIC ACID) 250 MG tablet, Take 250 mg by mouth daily., Disp: , Rfl:         Objective:        BP 136/72 (BP Location: Left Arm, Patient Position: Sitting, Cuff Size: Normal)   Pulse (!) 101   Temp 97.9 F (36.6 C) (Temporal)   Wt 133 lb 3.2 oz (60.4 kg)   SpO2 98%   BMI 23.60  kg/m   Physical Exam MEASUREMENTS: Weight- 133, BMI- 23.6.  Wt Readings from Last 3 Encounters:  08/08/24 133 lb 3.2 oz (60.4 kg)  07/18/24 133 lb 12.8 oz (60.7 kg)  02/07/24 137 lb 6.4 oz (62.3 kg)    Physical Exam Vitals reviewed.  Constitutional:      General: She is not in acute distress.    Appearance: Normal appearance. She is normal weight. She is not ill-appearing, toxic-appearing or diaphoretic.  HENT:     Head: Normocephalic.  Cardiovascular:     Rate and Rhythm: Normal rate and regular rhythm.  Pulmonary:     Effort: Pulmonary effort is normal.     Breath sounds: Normal breath sounds.  Musculoskeletal:        General: Normal range of motion.     Right lower leg: No edema.     Left lower leg: No edema.  Neurological:     General: No focal deficit present.     Mental Status: She is alert and oriented to person, place, and time. Mental status is at baseline.  Psychiatric:        Mood and Affect: Mood normal.        Behavior: Behavior normal.        Thought Content: Thought content normal.        Judgment: Judgment normal.          Results RADIOLOGY Mammogram: Normal (10/05/2023)  DIAGNOSTIC Colonoscopy: Normal (09/2022)  Assessment & Plan:   Assessment and Plan Assessment & Plan Gastroesophageal reflux disease Severe heartburn previously led to discontinuation of metoprolol. Resumed esomeprazole  20 mg daily, resulting in significant improvement with no heartburn. Dietary habits may contribute to symptoms. - Continue esomeprazole  20 mg daily. - Advised dietary modifications to reduce heartburn triggers.  -Patient Counseling(The following topics were reviewed):  Preventative care handout given to pt  Health maintenance and immunizations reviewed. Please refer to Health maintenance section. Pt advised on safe sex, wearing seatbelts in car, and proper nutrition labwork ordered today for annual Dental health: Discussed importance of regular tooth  brushing, flossing, and dental visits.  Constipation Intermittent constipation with occasional nausea. Uses prunes and prune juice for relief. No regular bowel regimen in place. - Advised regular use of prunes or prune juice for constipation management.  History of acute myeloblastic leukemia, in remission In remission for 18 years. Missed oncology appointment due to feeling well. Importance of regular follow-up emphasized. - Encouraged rescheduling of oncology appointment for routine follow-up.  Osteoporosis Previously on Boniva , discontinued due to dental concerns. Long-term management needed.  General Health Maintenance Completed mammogram on January 28th, 2025. Received  flu shot on October 3rd, 2025. Declined COVID-19 vaccination. Discussed [employer] program for screenings and potential rewards. - Continue routine health maintenance screenings. - Encouraged participation in [employer] program for additional rewards.        Return in about 6 months (around 02/06/2025) for f/u CPE.     Ginger Patrick, MSN, APRN, FNP-C [facility]

## 2024-08-12 ENCOUNTER — Other Ambulatory Visit: Payer: Self-pay | Admitting: Student

## 2024-08-12 ENCOUNTER — Ambulatory Visit
Admission: EM | Admit: 2024-08-12 | Discharge: 2024-08-12 | Disposition: A | Discharge status: Home / Self Care | Attending: Student | Admitting: Student

## 2024-08-12 ENCOUNTER — Encounter: Payer: Self-pay | Admitting: Emergency Medicine

## 2024-08-12 DIAGNOSIS — N3001 Acute cystitis with hematuria: Secondary | ICD-10-CM | POA: Diagnosis not present

## 2024-08-12 LAB — POCT URINE DIPSTICK
Bilirubin, UA: NEGATIVE
Glucose, UA: NEGATIVE mg/dL
Ketones, POC UA: NEGATIVE mg/dL
Nitrite, UA: NEGATIVE
POC PROTEIN,UA: NEGATIVE
Spec Grav, UA: 1.025 (ref 1.010–1.025)
Urobilinogen, UA: 0.2 U/dL
pH, UA: 5.5 (ref 5.0–8.0)

## 2024-08-12 MED ORDER — NITROFURANTOIN MONOHYD MACRO 100 MG PO CAPS: 100.0000 mg | ORAL_CAPSULE | Freq: Two times a day (BID) | ORAL | 0 refills | Status: AC

## 2024-08-12 NOTE — ED Triage Notes (Signed)
 Patient reports urinary frequency and burning sensation that started in the middle of the night. Patient has not taking anything for symptoms.

## 2024-08-12 NOTE — Discharge Instructions (Signed)
-  Based on your urine test results, you have a urinary tract infection. Your urine showed white blood cells and blood. -Start the antibiotic: Macrobid  twice daily x7 days. Take with food -Drink a lot of water  or gatorade -Follow-up if symptoms change or worsen: abdominal pain, new mid-back pain, new fevers, worsening urinary symptoms

## 2024-08-12 NOTE — ED Provider Notes (Signed)
 Roberta Curtis    CSN: [id number] Arrival date & time: 08/12/24  0815      History   Chief Complaint Chief Complaint  Patient presents with   Urinary Frequency   Dysuria    HPI Roberta Curtis is a 83 y.o. female presenting with urinary symptoms. Patient reports urinary frequency and burning sensation that started in the middle of the night. Patient has not taking anything for symptoms. Denies abd pain, CVAT, fevers.  Last UTI was 10/2022.  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HCC)    GERD (gastroesophageal reflux disease)    Heart murmur    Osteopenia    Thrush     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   Bunion, left foot 08/09/2023   Mitral valve prolapse 02/26/2023   Irregular heart beat 02/22/2023   Abnormal EKG 02/22/2023   Hypocalcemia 10/12/2022   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   Other emphysema (HCC) 08/06/2022   Left-sided sensory deficit present    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   B12 deficiency 02/12/2022   Headache disorder 02/12/2022   Change in bowel habits 02/12/2022   Neurological deficit present    Questionable TIA (transient ischemic attack) 02/08/2022   Left-sided weakness 02/08/2022   Hypochromic-microcytic anemia 02/08/2022   Dizziness 01/08/2022   Vertigo 01/08/2022   Aortic atherosclerosis 12/22/2021   Neuropathy 11/05/2021   Urge incontinence 09/05/2020    Chronic nausea 08/13/2020   Chronic bilateral low back pain without sciatica 05/07/2020   Mild diastolic dysfunction 02/14/2020   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   GERD (gastroesophageal reflux disease) 02/12/2020   Mitral valve regurgitation 02/12/2020   Prediabetes 02/12/2020   CKD (chronic kidney disease) stage 2, 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    Past Surgical History:  Procedure Laterality Date   BONE MARROW TRANSPLANT     BREAST LUMPECTOMY Left    fatty ulfnm,8029'd   CATARACT EXTRACTION     CESAREAN SECTION     COLONOSCOPY WITH PROPOFOL  N/A 10/06/2022   Procedure: COLONOSCOPY WITH PROPOFOL ;  Surgeon: Therisa Bi, MD;  Location: [facility] ENDOSCOPY;  Service: Gastroenterology;  Laterality: N/A;   EYE SURGERY     HYSTEROTOMY     IR BONE MARROW BIOPSY & ASPIRATION  11/24/2023   LYMPH GLAND EXCISION     MENISCUS REPAIR Right    NECK SURGERY     SALPINGECTOMY     WISDOM TOOTH EXTRACTION      OB History   No obstetric history on file.      Home Medications    Prior to Admission medications   Medication Sig Start Date End Date Taking? Authorizing Provider  nitrofurantoin , macrocrystal-monohydrate, (MACROBID ) 100 MG capsule Take 1 capsule (100 mg total) by mouth 2 (two) times daily for 7 days. 08/12/24 08/19/24 Yes Franziska Podgurski E, PA-C  albuterol  (VENTOLIN  HFA) 108 (90 Base) MCG/ACT inhaler Inhale 1-2 puffs into the lungs as needed for wheezing or shortness of breath. 05/25/22  Velma Raisin, MD  aspirin EC 81 MG tablet Take 81 mg by mouth daily. Swallow whole.    [provider]  Biotin 1 MG CAPS Take 1,000 mcg by mouth daily.     [provider]  calcium  carbonate (OS-CAL - DOSED IN MG OF ELEMENTAL CALCIUM ) 1250 (500 Ca) MG tablet Take 1 tablet by mouth daily.    [provider]  Cholecalciferol (VITAMIN D3) 50 MCG (2000 UT) CAPS Take 4,000 Units by mouth daily.    [provider]  cyanocobalamin  (VITAMIN B12) 500 MCG  tablet Take 500 mcg by mouth daily.    [provider]  esomeprazole  (NEXIUM ) 20 MG capsule Take 1 capsule (20 mg total) by mouth daily at 12 noon. 08/08/24   Dugal, Tabitha, FNP  Iron , Ferrous Sulfate , 325 (65 Fe) MG TABS Take 325 mg by mouth daily. 02/07/24   Corwin Antu, FNP  magnesium  oxide (MAG-OX) 400 (240 Mg) MG tablet Take 400 mg by mouth daily.    [provider]  meclizine  (ANTIVERT ) 12.5 MG tablet Take 1 tablet (12.5 mg total) by mouth 3 (three) times daily as needed. 07/18/24   Corwin Antu, FNP  vitamin C (ASCORBIC ACID) 250 MG tablet Take 250 mg by mouth daily.    [provider]    Family History Family History  Problem Relation Age of Onset   Retinitis pigmentosa Mother    Stroke Mother    Liver cancer Father    Heart disease Father    Heart disease Brother    Blindness Brother    Breast cancer Neg Hx     Social History Social History   Tobacco Use   Smoking status: Former    Current packs/day: 0.00    Average packs/day: 1 pack/day for 6.0 years (6.0 ttl pk-yrs)    Types: Cigarettes    Start date: 02/21/1964    Quit date: 02/20/1970    Years since quitting: 54.5   Smokeless tobacco: Never  Vaping Use   Vaping status: Never Used  Substance Use Topics   Alcohol use: Not Currently   Drug use: Never     Allergies   Jadenu [deferasirox], Ketotifen fumarate, Levofloxacin, Ondansetron hcl, Codeine, Meloxicam, Oxycodone-acetaminophen , Penicillins, and Robaxin  [methocarbamol ]   Review of Systems Review of Systems  Constitutional:  Negative for appetite change, chills, diaphoresis and fever.  Respiratory:  Negative for shortness of breath.   Cardiovascular:  Negative for chest pain.  Gastrointestinal:  Negative for abdominal pain, blood in stool, constipation, diarrhea, nausea and vomiting.  Genitourinary:  Negative for decreased urine volume, difficulty urinating, dysuria, flank pain, frequency, genital sores, hematuria and urgency.   Musculoskeletal:  Negative for back pain.  Neurological:  Negative for dizziness, weakness and light-headedness.  All other systems reviewed and are negative.    Physical Exam Triage Vital Signs ED Triage Vitals  Encounter Vitals Group     BP      Girls Systolic BP Percentile      Girls Diastolic BP Percentile      Boys Systolic BP Percentile      Boys Diastolic BP Percentile      Pulse      Resp      Temp      Temp src      SpO2      Weight      Height      Head Circumference      Peak Flow      Pain Score  Pain Loc      Pain Education      Exclude from Growth Chart    No data found.  Updated Vital Signs BP (!) 145/67 (BP Location: Left Arm)   Pulse 60   Temp 98.1 F (36.7 C) (Oral)   Resp 18   SpO2 98%   Visual Acuity Right Eye Distance:   Left Eye Distance:   Bilateral Distance:    Right Eye Near:   Left Eye Near:    Bilateral Near:     Physical Exam Vitals reviewed.  Constitutional:      General: She is not in acute distress.    Appearance: Normal appearance. She is not ill-appearing.  HENT:     Head: Normocephalic and atraumatic.     Mouth/Throat:     Mouth: Mucous membranes are moist.     Comments: Moist mucous membranes Eyes:     Extraocular Movements: Extraocular movements intact.     Pupils: Pupils are equal, round, and reactive to light.  Cardiovascular:     Rate and Rhythm: Normal rate and regular rhythm.     Heart sounds: Normal heart sounds.  Pulmonary:     Effort: Pulmonary effort is normal.     Breath sounds: Normal breath sounds. No wheezing, rhonchi or rales.  Abdominal:     General: Bowel sounds are normal. There is no distension.     Palpations: Abdomen is soft. There is no mass.     Tenderness: There is no abdominal tenderness. There is no right CVA tenderness, left CVA tenderness, guarding or rebound.  Skin:    General: Skin is warm.     Capillary Refill: Capillary refill takes less than 2 seconds.     Comments: Good  skin turgor  Neurological:     General: No focal deficit present.     Mental Status: She is alert and oriented to person, place, and time.  Psychiatric:        Mood and Affect: Mood normal.        Behavior: Behavior normal.      UC Treatments / Results  Labs (all labs ordered are listed, but only abnormal results are displayed) Labs Reviewed  POCT URINE DIPSTICK - Abnormal; Notable for the following components:      Result Value   Blood, UA trace-lysed (*)    Leukocytes, UA Small (1+) (*)    All other components within normal limits  URINE CULTURE    EKG   Radiology No results found.  Procedures Procedures (including critical care time)  Medications Ordered in UC Medications - No data to display  Initial Impression / Assessment and Plan / UC Course  I have reviewed the triage vital signs and the nursing notes.  Pertinent labs & imaging results that were available during my care of the patient were reviewed by me and considered in my medical decision making (see chart for details).     Patient is a pleasant 83 y.o. female presenting with acute cystitis. The patient is afebrile and nontachycardic.  Antipyretic has not been administered today.  Last UTI was 10/24/2022 and was resistant to ampicillin and sulbactam only; she was treated with Macrobid  with resolution at that time.  She is penicillin and fluoroquinolone allergic. On exam, there is no abdominal pain or CVAT.  UA: With trace blood, small leuk. Culture sent. She does not have a history of kidney disease.  Creatinine and GFR within normal range on 08/2024 BMP.  Will treat with  Macrobid  while awaiting culture results.  Encouraged good hydration.  Return precautions as below.  Final Clinical Impressions(s) / UC Diagnoses   Final diagnoses:  Acute cystitis with hematuria     Discharge Instructions      -Based on your urine test results, you have a urinary tract infection. Your urine showed white blood  cells and blood. -Start the antibiotic: Macrobid  twice daily x7 days. Take with food -Drink a lot of water  or gatorade -Follow-up if symptoms change or worsen: abdominal pain, new mid-back pain, new fevers, worsening urinary symptoms     ED Prescriptions     Medication Sig Dispense Auth. Provider   nitrofurantoin , macrocrystal-monohydrate, (MACROBID ) 100 MG capsule Take 1 capsule (100 mg total) by mouth 2 (two) times daily for 7 days. 14 capsule Azka Steger E, PA-C      PDMP not reviewed this encounter.   Arlyss Leita BRAVO, PA-C 08/12/24 [id number]

## 2024-08-14 ENCOUNTER — Telehealth: Payer: Self-pay

## 2024-08-14 NOTE — Telephone Encounter (Signed)
 NOTED

## 2024-08-14 NOTE — Telephone Encounter (Signed)
 Roberta Curtis

## 2024-08-14 NOTE — Telephone Encounter (Signed)
 noted

## 2024-08-14 NOTE — Telephone Encounter (Signed)
 Per chart review pt went to [facility] on 08/12/24. Sending note to ONEIDA Patrick FNP.

## 2024-08-18 LAB — URINE CULTURE

## 2024-09-06 ENCOUNTER — Other Ambulatory Visit: Payer: Self-pay | Admitting: Family

## 2024-09-06 DIAGNOSIS — K219 Gastro-esophageal reflux disease without esophagitis: Secondary | ICD-10-CM

## 2024-09-06 NOTE — Telephone Encounter (Unsigned)
 Copied from CRM #[id number]. Topic: Clinical - Medication Refill >> Sep 06, 2024 10:33 AM Viola F wrote: Medication: esomeprazole  (NEXIUM ) 20 MG capsule [[id number]]  Has the patient contacted their pharmacy? Yes (Agent: If no, request that the patient contact the pharmacy for the refill. If patient does not wish to contact the pharmacy document the reason why and proceed with request.) (Agent: If yes, when and what did the pharmacy advise?)  This is the patient's preferred pharmacy:   [id number] [address] [facility] - [address] [place] [address] Phone: [phone] Fax: [phone]  Is this the correct pharmacy for this prescription? Yes If no, delete pharmacy and type the correct one.   Has the prescription been filled recently? Yes  Is the patient out of the medication? No, 4 pills left   Has the patient been seen for an appointment in the last year OR does the patient have an upcoming appointment? Yes  Can we respond through MyChart? Yes  Agent: Please be advised that Rx refills may take up to 3 business days. We ask that you follow-up with your pharmacy.

## 2024-09-12 ENCOUNTER — Emergency Department
Admission: EM | Admit: 2024-09-12 | Discharge: 2024-09-12 | Disposition: A | Discharge status: Home / Self Care | Attending: Emergency Medicine | Admitting: Emergency Medicine

## 2024-09-12 ENCOUNTER — Other Ambulatory Visit: Payer: Self-pay

## 2024-09-12 DIAGNOSIS — Y93G1 Activity, food preparation and clean up: Secondary | ICD-10-CM | POA: Diagnosis not present

## 2024-09-12 DIAGNOSIS — S6992XA Unspecified injury of left wrist, hand and finger(s), initial encounter: Secondary | ICD-10-CM | POA: Insufficient documentation

## 2024-09-12 DIAGNOSIS — N182 Chronic kidney disease, stage 2 (mild): Secondary | ICD-10-CM | POA: Diagnosis not present

## 2024-09-12 DIAGNOSIS — W268XXA Contact with other sharp object(s), not elsewhere classified, initial encounter: Secondary | ICD-10-CM | POA: Diagnosis not present

## 2024-09-12 NOTE — ED Provider Notes (Signed)
 "  [facility] Provider Note    Event Date/Time   First MD Initiated Contact with Patient 09/12/24 1747     (approximate)   History   Finger Injury   HPI  Roberta Curtis is a 84 y.o. female who presents today for evaluation of left index finger injury.  Patient reports that she was slicing onions approximately 3 hours prior to arrival and accidentally sliced part of her fingernail off.  She reports that has been bleeding ever since.  She does not take any blood thinners or antiplatelet drugs.  No numbness or tingling.  She is able to range her finger fully and normally.  She reports that her tetanus is up-to-date.  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   Bunion, left foot 08/09/2023   Mitral valve prolapse 02/26/2023   Irregular heart beat 02/22/2023   Abnormal EKG 02/22/2023   Hypocalcemia 10/12/2022   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   Other emphysema (HCC) 08/06/2022   Left-sided sensory deficit present    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   B12 deficiency 02/12/2022   Headache disorder 02/12/2022   Change in bowel habits 02/12/2022   Neurological deficit present    Questionable TIA (transient ischemic attack) 02/08/2022   Left-sided weakness 02/08/2022   Hypochromic-microcytic anemia 02/08/2022   Dizziness 01/08/2022   Vertigo 01/08/2022   Aortic atherosclerosis 12/22/2021   Neuropathy 11/05/2021   Urge incontinence  09/05/2020   Chronic nausea 08/13/2020   Chronic bilateral low back pain without sciatica 05/07/2020   Mild diastolic dysfunction 02/14/2020   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   GERD (gastroesophageal reflux disease) 02/12/2020   Mitral valve regurgitation 02/12/2020   Prediabetes 02/12/2020   CKD (chronic kidney disease) stage 2, 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7 C)  SpO2: 98%    Physical Exam Vitals and nursing note reviewed.  Constitutional:      General: Awake  and alert. No acute distress.    Appearance: Normal appearance. The patient is normal weight.  HENT:     Head: Normocephalic and atraumatic.     Mouth: Mucous membranes are moist.  Eyes:     General: PERRL. Normal EOMs        Right eye: No discharge.        Left eye: No discharge.     Conjunctiva/sclera: Conjunctivae normal.  Cardiovascular:     Rate and Rhythm: Normal rate and regular rhythm.     Pulses: Normal pulses.  Pulmonary:     Effort: Pulmonary effort is normal. No respiratory distress.     Breath sounds: Normal breath sounds.  Abdominal:     Abdomen is soft. There is no abdominal tenderness. No rebound or guarding. No distention. Musculoskeletal:        General: No swelling. Normal range of motion.     Cervical back: Normal range of motion and neck supple.  Left  index finger: Half of the fingernail has been scraped off, oozing bright red blood, no pulsatile bleeding.  Nailbed in place.  She is able to flex and extend at isolated PIP, DIP, and MCP against resistance.  Sensation is intact light touch throughout.  Normal grip strength. Skin:    General: Skin is warm and dry.     Capillary Refill: Capillary refill takes less than 2 seconds.     Findings: No rash.  Neurological:     Mental Status: The patient is awake and alert.      ED Results / Procedures / Treatments   Labs (all labs ordered are listed, but only abnormal results are displayed) Labs Reviewed - No data to display   EKG     RADIOLOGY     PROCEDURES:  Critical Care performed:   Procedures   MEDICATIONS ORDERED IN ED: Medications - No data to display   IMPRESSION / MDM / ASSESSMENT AND PLAN / ED COURSE  I reviewed the triage vital signs and the nursing notes.   Differential diagnosis includes, but is not limited to, skin tear, abrasion, fracture less likely.  Patient is awake and alert, hemodynamically stable and afebrile.  She is nontoxic in appearance.  Half of her fingernail has been scraped off, though the base of the nail still firmly embedded within the eponychial fold.  She is able to flex and extend at isolated PIP, DIP, and MCP against resistance, do not suspect tendon injury.  Surgicel was applied and dressing placed.  Patient was instructed to remove the dressing in 1 to 2 days, or if he becomes visibly wet or soiled.  I reviewed her chart.  Her platelets are within normal limits, last checked 1 month ago and normal at 243.  We discussed symptomatic management and return precautions.  Patient understands and agrees with plan.  She was discharged in stable condition   Patient's presentation is most consistent with acute complicated illness / injury requiring diagnostic workup.    FINAL CLINICAL IMPRESSION(S) / ED DIAGNOSES   Final diagnoses:  Injury  of finger of left hand, initial encounter     Rx / DC Orders   ED Discharge Orders     None        Note:  This document was prepared using Dragon voice recognition software and may include unintentional dictation errors.   Crystin Lechtenberg E, PA-C 09/12/24 1823    Bradler, Evan K, MD 09/12/24 [phone]  "

## 2024-09-12 NOTE — ED Triage Notes (Signed)
 Pt to ED via POV from home. Pt reports was cutting onions and cut left pointer finger. Still bleeding on arrival and dressing applied. Denies pain. Up to date on tetanus.

## 2024-09-12 NOTE — Discharge Instructions (Addendum)
 You may take the bandage off in 24 to 48 hours, though please remove it if it becomes visibly wet or soiled.  Please return for any new, worsening, or changing symptoms or other concerns.  It was a pleasure caring for you today.

## 2024-09-14 ENCOUNTER — Ambulatory Visit: Payer: Self-pay

## 2024-09-14 NOTE — Telephone Encounter (Addendum)
" °  FYI Only or Action Required?: FYI only for provider: appointment scheduled on 01.09.26.  Patient was last seen in primary care on 08/08/2024 by Corwin Antu, FNP.  Called Nurse Triage reporting Hand Pain.  Symptoms began several days ago.  Interventions attempted: OTC medications: Advil and Tylenol .  Symptoms are: stable.  Triage Disposition: See PCP When Office is Open (Within 3 Days)  Patient/caregiver understands and will follow disposition?: Yes  Copied from CRM #[id number]. Topic: Clinical - Red Word Triage >> Sep 14, 2024  8:04 AM Avram MATSU wrote: Red Word that prompted transfer to Nurse Triage: patient is having a extreme pain on her fingers going down her hands. Reason for Disposition  [1] MODERATE pain (e.g., interferes with normal activities) AND [2] present > 3 days  Answer Assessment - Initial Assessment Questions 1. ONSET: When did the pain start?       2 days  2. LOCATION and RADIATION: Where is the pain located?  (e.g., fingertip, around nail, joint, entire      Pointer finger and other fingers to pinky  3. SEVERITY: How bad is the pain? What does it keep you from doing?   (Scale 1-10; or mild, moderate, severe)     10/10  4. APPEARANCE: What does the finger look like? (e.g., redness, swelling, bruising, pallor)     bandaged   5. WORK OR EXERCISE: Has there been any recent work or exercise that involved this part (i.e., fingers or hand) of the body?     Denies  6. CAUSE: What do you think is causing the pain?     Cut finger with a knife  7. AGGRAVATING FACTORS: What makes the pain worse? (e.g., using computer)      OTC medications work for a little while, however, wears off  8. OTHER SYMPTOMS: Pt denies fever, redness, numbness        Pt reports finger pain/injury Pt is taking OTC Advil and Tylenol  Pt advised to alternate Tylenol  and Advil for better/longer coverage. Pt scheduled for a visit on  01.09.26  for further evaluation. Pt  agrees with plan of care, will call back for any worsening symptoms  Protocols used: Finger Pain-A-AH  Pt seen at ED for evaluation and treatment and requesting an office visit for followup.  Called [facility] for a same day hospital follow up visit, however, none available. Pt scheduled for soonest appt. "

## 2024-09-14 NOTE — Telephone Encounter (Signed)
 Next Appt With Family Medicine Darra Ring, MD) 09/15/2024 at 12:00 PM

## 2024-09-14 NOTE — Telephone Encounter (Signed)
 NOTED

## 2024-09-15 ENCOUNTER — Ambulatory Visit: Admitting: Family Medicine

## 2024-09-15 ENCOUNTER — Encounter: Payer: Self-pay | Admitting: Family Medicine

## 2024-09-15 VITALS — BP 150/90 | HR 86 | Temp 98.0°F | Ht 63.0 in | Wt 135.2 lb

## 2024-09-15 DIAGNOSIS — S61311D Laceration without foreign body of left index finger with damage to nail, subsequent encounter: Secondary | ICD-10-CM

## 2024-09-15 DIAGNOSIS — S61311A Laceration without foreign body of left index finger with damage to nail, initial encounter: Secondary | ICD-10-CM | POA: Insufficient documentation

## 2024-09-15 MED ORDER — CEPHALEXIN 500 MG PO CAPS: 500.0000 mg | ORAL_CAPSULE | Freq: Three times a day (TID) | ORAL | 0 refills | Status: AC

## 2024-09-15 NOTE — Assessment & Plan Note (Signed)
 Acute, most likely cause of resulting hand pain is extremely tight bandage. No evidence of vascular or neurologic compromise. There is some mild erythema at index fingertip so will cover for possible beginning cellulitis with Keflex  500 mg p.o. 3 times daily x 7 days. Wound cleaned and antibiotic ointment with new dressing applied. Offered pain medication for patient to use at night if unable to sleep but she is not interested.  She instead will use Tylenol  500 to 650 mg 2 tablets p.o. 3 times daily as needed pain.  Return and ER precautions provided.

## 2024-09-15 NOTE — Patient Instructions (Addendum)
"   Ice finger 2-3 times daily for no more than 5 minutes.  Elevated finger at night.  Remove and replace bandage daily. Wash with warm soapy water .  Use tylenol  for pain 500-650 mg 2 tablets every 8 hours as needed for pain.  Can restart alternating with ibuprofen once  scab formed.  Complete antibiotic course.  Call if redness spreading, or pain control or fever.  "

## 2024-09-15 NOTE — Progress Notes (Signed)
 "   Patient ID: Roberta Curtis, female    DOB: Nov 06, 1940, 84 y.o.   MRN: [id number]  This visit was conducted in person.  BP (!) 150/90   Pulse 86   Temp 98 F (36.7 C) (Oral)   Ht 5' 3 (1.6 m)   Wt 135 lb 4 oz (61.3 kg)   SpO2 98%   BMI 23.96 kg/m    CC:  Chief Complaint  Patient presents with   Follow-up    09/12/2024 for Finger Injury   Hand Pain    Severe Pain in hand mostly at night    Subjective:   HPI: Roberta Curtis is a 84 y.o. female presenting on 09/15/2024 for Follow-up (09/12/2024 for Finger Injury) and Hand Pain (Severe Pain in hand mostly at night)   Hx of AML in remission  Prediabetes, CKD   Seen at ED 09/12/2024 after accidental of left index finger while slicing onions. Tetanus up-to-date. Not on any blood thinners or antiplatelet drugs. Hemostasis was achieved in the ED. In ED she was able to flex and extend at isolated PIP, DIP and MCP against wrist distance.  Did not suspect tendon injury. Surgicel was applied and dressing placed.  Patient recommended remove the dressing in 1 to 2 days.  Hemoglobin and platelets in the normal range.  Today she presents given unable to remove dressing and pain in hand keeping her up at night.  Coban wrap was very tight.. she has been having pain in all finger hand.  Good ROM., no temperature change and numbness  During the day feels well.  She has ben using 2 tylenol   and 2 advil every five hours during the day.   No fever. No redness spreading. No swelling in fingers.   Minor PCN  allergy.. keflex  should be tolerated.     Relevant past medical, surgical, family and social history reviewed and updated as indicated. Interim medical history since our last visit reviewed. Allergies and medications reviewed and updated. Outpatient Medications Prior to Visit  Medication Sig Dispense Refill   albuterol  (VENTOLIN  HFA) 108 (90 Base) MCG/ACT inhaler Inhale 1-2 puffs into the lungs as needed for wheezing or shortness of  breath. 6.7 g 1   aspirin EC 81 MG tablet Take 81 mg by mouth daily. Swallow whole.     Biotin 1 MG CAPS Take 1,000 mcg by mouth daily.      calcium  carbonate (OS-CAL - DOSED IN MG OF ELEMENTAL CALCIUM ) 1250 (500 Ca) MG tablet Take 1 tablet by mouth daily.     Cholecalciferol (VITAMIN D3) 50 MCG (2000 UT) CAPS Take 4,000 Units by mouth daily.     cyanocobalamin  (VITAMIN B12) 500 MCG tablet Take 500 mcg by mouth daily.     esomeprazole  (NEXIUM ) 20 MG capsule TAKE 1 CAPSULE (20 MG TOTAL) BY MOUTH DAILY AT 12 NOON. 90 capsule 3   Iron , Ferrous Sulfate , 325 (65 Fe) MG TABS Take 325 mg by mouth daily.     magnesium  oxide (MAG-OX) 400 (240 Mg) MG tablet Take 400 mg by mouth daily.     meclizine  (ANTIVERT ) 12.5 MG tablet Take 1 tablet (12.5 mg total) by mouth 3 (three) times daily as needed. 30 tablet 0   vitamin C (ASCORBIC ACID) 250 MG tablet Take 250 mg by mouth daily.     No facility-administered medications prior to visit.     Per HPI unless specifically indicated in ROS section below Review of Systems  Constitutional:  Negative for  fatigue and fever.  HENT:  Negative for congestion.   Eyes:  Negative for pain.  Respiratory:  Negative for cough and shortness of breath.   Cardiovascular:  Negative for chest pain, palpitations and leg swelling.  Gastrointestinal:  Negative for abdominal pain.  Genitourinary:  Negative for dysuria and vaginal bleeding.  Musculoskeletal:  Negative for back pain.  Neurological:  Negative for syncope, light-headedness and headaches.  Psychiatric/Behavioral:  Negative for dysphoric mood.    Objective:  BP (!) 150/90   Pulse 86   Temp 98 F (36.7 C) (Oral)   Ht 5' 3 (1.6 m)   Wt 135 lb 4 oz (61.3 kg)   SpO2 98%   BMI 23.96 kg/m   Wt Readings from Last 3 Encounters:  09/15/24 135 lb 4 oz (61.3 kg)  08/08/24 133 lb 3.2 oz (60.4 kg)  07/18/24 133 lb 12.8 oz (60.7 kg)      Physical Exam Constitutional:      General: She is not in acute distress.     Appearance: Normal appearance. She is well-developed. She is not ill-appearing or toxic-appearing.  HENT:     Head: Normocephalic.     Right Ear: Hearing, tympanic membrane, ear canal and external ear normal. Tympanic membrane is not erythematous, retracted or bulging.     Left Ear: Hearing, tympanic membrane, ear canal and external ear normal. Tympanic membrane is not erythematous, retracted or bulging.     Nose: No mucosal edema or rhinorrhea.     Right Sinus: No maxillary sinus tenderness or frontal sinus tenderness.     Left Sinus: No maxillary sinus tenderness or frontal sinus tenderness.     Mouth/Throat:     Pharynx: Uvula midline.  Eyes:     General: Lids are normal. Lids are everted, no foreign bodies appreciated.     Conjunctiva/sclera: Conjunctivae normal.     Pupils: Pupils are equal, round, and reactive to light.  Neck:     Thyroid : No thyroid  mass or thyromegaly.     Vascular: No carotid bruit.     Trachea: Trachea normal.  Cardiovascular:     Rate and Rhythm: Normal rate and regular rhythm.     Pulses: Normal pulses.     Heart sounds: Normal heart sounds, S1 normal and S2 normal. No murmur heard.    No friction rub. No gallop.  Pulmonary:     Effort: Pulmonary effort is normal. No tachypnea or respiratory distress.     Breath sounds: Normal breath sounds. No decreased breath sounds, wheezing, rhonchi or rales.  Abdominal:     General: Bowel sounds are normal.     Palpations: Abdomen is soft.     Tenderness: There is no abdominal tenderness.  Musculoskeletal:     Cervical back: Normal range of motion and neck supple.  Skin:    General: Skin is warm and dry.     Findings: No rash.     Comments: Left index finger with erythema to DIP joint lacerated medial aspect of nail slight oozing once extremely tight dressing removed Normal pulses good range of motion of all joints normal grip of hand No decreased temperature of fingertip  Neurological:     Mental Status: She  is alert.  Psychiatric:        Mood and Affect: Mood is not anxious or depressed.        Speech: Speech normal.        Behavior: Behavior normal. Behavior is cooperative.  Thought Content: Thought content normal.        Judgment: Judgment normal.       Results for orders placed or performed in visit on 08/12/24  Urine Culture   Collection Time: 08/12/24 12:00 AM  Result Value Ref Range   Urine Culture, Routine Final report (A)    Organism ID, Bacteria Citrobacter koseri (A)    Antimicrobial Susceptibility Comment     Assessment and Plan  Laceration of left index finger without foreign body with damage to nail, subsequent encounter Assessment & Plan: Acute, most likely cause of resulting hand pain is extremely tight bandage. No evidence of vascular or neurologic compromise. There is some mild erythema at index fingertip so will cover for possible beginning cellulitis with Keflex  500 mg p.o. 3 times daily x 7 days. Wound cleaned and antibiotic ointment with new dressing applied. Offered pain medication for patient to use at night if unable to sleep but she is not interested.  She instead will use Tylenol  500 to 650 mg 2 tablets p.o. 3 times daily as needed pain.  Return and ER precautions provided.   Other orders -     Cephalexin ; Take 1 capsule (500 mg total) by mouth 3 (three) times daily for 5 days.  Dispense: 15 capsule; Refill: 0    No follow-ups on file.   Greig Ring, MD  "

## 2024-09-18 ENCOUNTER — Telehealth: Payer: Self-pay | Admitting: Family

## 2024-09-18 ENCOUNTER — Encounter: Payer: Self-pay | Admitting: Family

## 2024-09-18 DIAGNOSIS — M8580 Other specified disorders of bone density and structure, unspecified site: Secondary | ICD-10-CM

## 2024-09-18 NOTE — Telephone Encounter (Signed)
 Last DEXA was 08/2023. Okay to order?

## 2024-09-18 NOTE — Telephone Encounter (Signed)
 Copied from CRM [id number]. Topic: General - Other >> Sep 18, 2024 10:53 AM Thersia BROCKS wrote: Reason for CRM: Patient called in state she needs a order for bone denstity  [facility] [phone]

## 2024-09-19 ENCOUNTER — Telehealth: Payer: Self-pay | Admitting: Family

## 2024-09-19 NOTE — Telephone Encounter (Signed)
 Order has been placed. Pt is aware and she will call and get this scheduled.

## 2024-09-19 NOTE — Telephone Encounter (Signed)
 Yes this is fine to order

## 2024-09-19 NOTE — Telephone Encounter (Signed)
 Spoke with pt about another matter but when speaking to her she mention that her finger is still giving her a fit. Pt had an appointment with Dr. Avelina last week about an injury she had to her left pointer finger. She is almost done with the antibiotic that was given but her finger is still very swollen, red and a little purple. Pt would like to know what she needs to do going forward. Does she need another appointment to be evaluated?

## 2024-09-19 NOTE — Addendum Note (Signed)
 Addended by: ALBINO SHAVER C on: 09/19/2024 04:08 PM   Modules accepted: Orders

## 2024-09-20 ENCOUNTER — Other Ambulatory Visit: Payer: Self-pay | Admitting: Family

## 2024-09-20 DIAGNOSIS — Z1231 Encounter for screening mammogram for malignant neoplasm of breast: Secondary | ICD-10-CM

## 2024-09-20 NOTE — Telephone Encounter (Signed)
 Appointment scheduled with Dr. Bedsole 09/21/24 at 3:00 pm.   Ginger is out of the until next Tuesday.  FYI to Dr. Avelina.

## 2024-09-21 ENCOUNTER — Encounter: Payer: Self-pay | Admitting: Family Medicine

## 2024-09-21 ENCOUNTER — Ambulatory Visit: Admitting: Family Medicine

## 2024-09-21 VITALS — BP 118/78 | HR 86 | Temp 98.4°F | Ht 63.0 in | Wt 133.5 lb

## 2024-09-21 DIAGNOSIS — S61311D Laceration without foreign body of left index finger with damage to nail, subsequent encounter: Secondary | ICD-10-CM | POA: Diagnosis not present

## 2024-09-21 NOTE — Progress Notes (Signed)
 "   Patient ID: Roberta Curtis, female    DOB: 16-Apr-1941, 84 y.o.   MRN: [id number]  This visit was conducted in person.  BP 118/78   Pulse 86   Temp 98.4 F (36.9 C) (Temporal)   Ht 5' 3 (1.6 m)   Wt 133 lb 8 oz (60.6 kg)   SpO2 99%   BMI 23.65 kg/m    CC:  Chief Complaint  Patient presents with   Finger Injury    Follow Up Since then she has also cut her left thumb    Subjective:   HPI: Roberta Curtis is a 84 y.o. female presenting on 09/21/2024 for Finger Injury (Follow Up/Since then she has also cut her left thumb)  Follow-up on laceration of left index finger with possible associated bacterial infection.  Recent treatment with Keflex  500 mg 3 times daily x 5 days She has completed the antibiotic course. 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  Relevant past medical, surgical, family and social history reviewed and updated as indicated. Interim medical history since our last visit reviewed. Allergies and medications reviewed and updated. Outpatient Medications Prior to Visit  Medication Sig Dispense Refill   albuterol  (VENTOLIN  HFA) 108 (90 Base) MCG/ACT inhaler Inhale 1-2 puffs into the lungs as needed for wheezing or shortness of breath. 6.7 g 1   aspirin EC 81 MG tablet Take 81 mg by mouth daily. Swallow whole.     Biotin 1 MG CAPS Take 1,000 mcg by mouth daily.      calcium  carbonate (OS-CAL - DOSED IN MG OF ELEMENTAL CALCIUM ) 1250 (500 Ca) MG tablet Take 1 tablet by mouth daily.     Cholecalciferol (VITAMIN D3) 50 MCG (2000 UT) CAPS Take 4,000 Units by mouth daily.     cyanocobalamin  (VITAMIN B12) 500 MCG tablet Take 500 mcg by mouth daily.     esomeprazole  (NEXIUM ) 20 MG capsule TAKE 1 CAPSULE (20 MG TOTAL) BY MOUTH DAILY AT 12 NOON. 90 capsule 3   magnesium  oxide (MAG-OX) 400 (240 Mg) MG tablet Take 400 mg by mouth daily.     meclizine   (ANTIVERT ) 12.5 MG tablet Take 1 tablet (12.5 mg total) by mouth 3 (three) times daily as needed. 30 tablet 0   Potassium 99 MG TABS Take 1 tablet by mouth daily.     vitamin C (ASCORBIC ACID) 250 MG tablet Take 250 mg by mouth daily.     Iron , Ferrous Sulfate , 325 (65 Fe) MG TABS Take 325 mg by mouth daily.     No facility-administered medications prior to visit.     Per HPI unless specifically indicated in ROS section below Review of Systems  Constitutional:  Negative for fatigue and fever.  HENT:  Negative for ear pain.   Eyes:  Negative for pain.  Respiratory:  Negative for chest tightness and shortness of breath.   Cardiovascular:  Negative for chest pain, palpitations and leg swelling.  Gastrointestinal:  Negative for abdominal pain.  Genitourinary:  Negative for dysuria.   Objective:  BP 118/78   Pulse 86   Temp 98.4 F (36.9 C) (Temporal)   Ht 5' 3 (1.6 m)   Wt 133 lb 8 oz (60.6 kg)   SpO2 99%   BMI 23.65 kg/m   Wt Readings from Last 3 Encounters:  09/21/24 133 lb 8 oz (  60.6 kg)  09/15/24 135 lb 4 oz (61.3 kg)  08/08/24 133 lb 3.2 oz (60.4 kg)      Physical Exam Constitutional:      General: She is not in acute distress.    Appearance: Normal appearance. She is well-developed. She is not ill-appearing or toxic-appearing.  HENT:     Head: Normocephalic.     Right Ear: Hearing, tympanic membrane, ear canal and external ear normal. Tympanic membrane is not erythematous, retracted or bulging.     Left Ear: Hearing, tympanic membrane, ear canal and external ear normal. Tympanic membrane is not erythematous, retracted or bulging.     Nose: No mucosal edema or rhinorrhea.     Right Sinus: No maxillary sinus tenderness or frontal sinus tenderness.     Left Sinus: No maxillary sinus tenderness or frontal sinus tenderness.     Mouth/Throat:     Pharynx: Uvula midline.  Eyes:     General: Lids are normal. Lids are everted, no foreign bodies appreciated.      Conjunctiva/sclera: Conjunctivae normal.     Pupils: Pupils are equal, round, and reactive to light.  Neck:     Thyroid : No thyroid  mass or thyromegaly.     Vascular: No carotid bruit.     Trachea: Trachea normal.  Cardiovascular:     Rate and Rhythm: Normal rate and regular rhythm.     Pulses: Normal pulses.     Heart sounds: Normal heart sounds, S1 normal and S2 normal. No murmur heard.    No friction rub. No gallop.  Pulmonary:     Effort: Pulmonary effort is normal. No tachypnea or respiratory distress.     Breath sounds: Normal breath sounds. No decreased breath sounds, wheezing, rhonchi or rales.  Abdominal:     General: Bowel sounds are normal.     Palpations: Abdomen is soft.     Tenderness: There is no abdominal tenderness.  Musculoskeletal:     Cervical back: Normal range of motion and neck supple.  Skin:    General: Skin is warm and dry.     Findings: No rash.     Comments: Left index finger with  resolved erythema, lacerated medial aspect of nail , on bleeding  Bruising at fingertip and base of nail Normal pulses good range of motion of all joints normal grip of hand  Slight numbness at finger tip  New laceration left thumb 1 cm, well approximated clean edges  Neurological:     Mental Status: She is alert.  Psychiatric:        Mood and Affect: Mood is not anxious or depressed.        Speech: Speech normal.        Behavior: Behavior normal. Behavior is cooperative.        Thought Content: Thought content normal.        Judgment: Judgment normal.       Results for orders placed or performed in visit on 08/12/24  Urine Culture   Collection Time: 08/12/24 12:00 AM  Result Value Ref Range   Urine Culture, Routine Final report (A)    Organism ID, Bacteria Citrobacter [email or web address] (A)    Antimicrobial Susceptibility Comment     Assessment and Plan  Laceration of left index finger without foreign body with damage to nail, subsequent encounter Assessment &  Plan: Acute  normal healing, resolved infection.  Bruising likely secondary to tight bandage initially placed in ER for hempostasis     No follow-ups  on file.   Greig Ring, MD  "

## 2024-09-21 NOTE — Assessment & Plan Note (Signed)
 Acute  normal healing, resolved infection.  Bruising likely secondary to tight bandage initially placed in ER for hempostasis

## 2024-10-05 ENCOUNTER — Ambulatory Visit
Admission: RE | Admit: 2024-10-05 | Discharge: 2024-10-05 | Disposition: A | Discharge status: Home / Self Care | Source: Ambulatory Visit | Attending: Family | Admitting: Family

## 2024-10-05 DIAGNOSIS — Z1231 Encounter for screening mammogram for malignant neoplasm of breast: Secondary | ICD-10-CM | POA: Diagnosis present

## 2024-10-09 ENCOUNTER — Ambulatory Visit: Payer: Self-pay | Admitting: Family

## 2025-02-06 ENCOUNTER — Ambulatory Visit: Admitting: Family
